# Patient Record
Sex: Male | Born: 1965 | Race: White | Hispanic: No | Marital: Married | Smoking: Never smoker
Health system: Southern US, Community
[De-identification: ages and names within clinical notes are randomized; demographics above are authoritative.]

## PROBLEM LIST (undated history)

## (undated) DIAGNOSIS — I82409 Acute embolism and thrombosis of unspecified deep veins of unspecified lower extremity: Secondary | ICD-10-CM

## (undated) DIAGNOSIS — N183 Chronic kidney disease, stage 3 unspecified: Secondary | ICD-10-CM

## (undated) DIAGNOSIS — E119 Type 2 diabetes mellitus without complications: Secondary | ICD-10-CM

## (undated) DIAGNOSIS — E7800 Pure hypercholesterolemia, unspecified: Secondary | ICD-10-CM

## (undated) DIAGNOSIS — F319 Bipolar disorder, unspecified: Secondary | ICD-10-CM

## (undated) DIAGNOSIS — I10 Essential (primary) hypertension: Secondary | ICD-10-CM

## (undated) HISTORY — PX: APPENDECTOMY: SHX54

---

## 2004-03-28 ENCOUNTER — Other Ambulatory Visit: Payer: Self-pay

## 2004-04-02 ENCOUNTER — Ambulatory Visit: Payer: Self-pay | Admitting: Unknown Physician Specialty

## 2004-07-06 ENCOUNTER — Ambulatory Visit: Payer: Self-pay | Admitting: Unknown Physician Specialty

## 2004-09-08 ENCOUNTER — Inpatient Hospital Stay: Payer: Self-pay | Admitting: Unknown Physician Specialty

## 2005-11-19 ENCOUNTER — Inpatient Hospital Stay: Payer: Self-pay | Admitting: Unknown Physician Specialty

## 2005-11-22 ENCOUNTER — Other Ambulatory Visit: Payer: Self-pay

## 2008-02-25 ENCOUNTER — Encounter: Admission: RE | Admit: 2008-02-25 | Discharge: 2008-02-25 | Payer: Self-pay | Admitting: Family Medicine

## 2008-02-25 ENCOUNTER — Inpatient Hospital Stay (HOSPITAL_COMMUNITY): Admission: EM | Admit: 2008-02-25 | Discharge: 2008-03-02 | Payer: Self-pay | Admitting: Emergency Medicine

## 2008-02-25 ENCOUNTER — Encounter (INDEPENDENT_AMBULATORY_CARE_PROVIDER_SITE_OTHER): Payer: Self-pay | Admitting: General Surgery

## 2008-03-02 ENCOUNTER — Inpatient Hospital Stay (HOSPITAL_COMMUNITY): Admission: AD | Admit: 2008-03-02 | Discharge: 2008-03-14 | Payer: Self-pay | Admitting: Psychiatry

## 2008-03-02 ENCOUNTER — Ambulatory Visit: Payer: Self-pay | Admitting: Psychiatry

## 2008-03-14 ENCOUNTER — Inpatient Hospital Stay: Payer: Self-pay | Admitting: Unknown Physician Specialty

## 2010-08-23 ENCOUNTER — Emergency Department (HOSPITAL_COMMUNITY)
Admission: EM | Admit: 2010-08-23 | Discharge: 2010-08-23 | Disposition: A | Discharge status: Home / Self Care | Payer: BC Managed Care – PPO | Attending: Emergency Medicine | Admitting: Emergency Medicine

## 2010-08-23 DIAGNOSIS — I10 Essential (primary) hypertension: Secondary | ICD-10-CM | POA: Insufficient documentation

## 2010-08-23 DIAGNOSIS — Z79899 Other long term (current) drug therapy: Secondary | ICD-10-CM | POA: Insufficient documentation

## 2010-08-23 DIAGNOSIS — R5383 Other fatigue: Secondary | ICD-10-CM | POA: Insufficient documentation

## 2010-08-23 DIAGNOSIS — E669 Obesity, unspecified: Secondary | ICD-10-CM | POA: Insufficient documentation

## 2010-08-23 DIAGNOSIS — R358 Other polyuria: Secondary | ICD-10-CM | POA: Insufficient documentation

## 2010-08-23 DIAGNOSIS — R631 Polydipsia: Secondary | ICD-10-CM | POA: Insufficient documentation

## 2010-08-23 DIAGNOSIS — R5381 Other malaise: Secondary | ICD-10-CM | POA: Insufficient documentation

## 2010-08-23 DIAGNOSIS — E785 Hyperlipidemia, unspecified: Secondary | ICD-10-CM | POA: Insufficient documentation

## 2010-08-23 DIAGNOSIS — R3589 Other polyuria: Secondary | ICD-10-CM | POA: Insufficient documentation

## 2010-08-23 DIAGNOSIS — E119 Type 2 diabetes mellitus without complications: Secondary | ICD-10-CM | POA: Insufficient documentation

## 2010-08-23 DIAGNOSIS — F319 Bipolar disorder, unspecified: Secondary | ICD-10-CM | POA: Insufficient documentation

## 2010-08-23 DIAGNOSIS — R000 Tachycardia, unspecified: Secondary | ICD-10-CM | POA: Insufficient documentation

## 2010-08-23 DIAGNOSIS — R110 Nausea: Secondary | ICD-10-CM | POA: Insufficient documentation

## 2010-08-23 DIAGNOSIS — H538 Other visual disturbances: Secondary | ICD-10-CM | POA: Insufficient documentation

## 2010-08-23 LAB — DIFFERENTIAL
Basophils Relative: 2 % — ABNORMAL HIGH (ref 0–1)
Eosinophils Absolute: 0 10*3/uL (ref 0.0–0.7)
Lymphs Abs: 1.2 10*3/uL (ref 0.7–4.0)
Monocytes Absolute: 1 10*3/uL (ref 0.1–1.0)
Neutro Abs: 8.6 10*3/uL — ABNORMAL HIGH (ref 1.7–7.7)
Neutrophils Relative %: 78 % — ABNORMAL HIGH (ref 43–77)

## 2010-08-23 LAB — CBC
MCH: 29.8 pg (ref 26.0–34.0)
MCHC: 36.5 g/dL — ABNORMAL HIGH (ref 30.0–36.0)
MCV: 81.6 fL (ref 78.0–100.0)
Platelets: ADEQUATE 10*3/uL (ref 150–400)
RDW: 12.2 % (ref 11.5–15.5)

## 2010-08-23 LAB — POCT I-STAT, CHEM 8
Calcium, Ion: 1.19 mmol/L (ref 1.12–1.32)
Glucose, Bld: 635 mg/dL (ref 70–99)
HCT: 49 % (ref 39.0–52.0)
Hemoglobin: 16.7 g/dL (ref 13.0–17.0)

## 2010-08-24 ENCOUNTER — Observation Stay (HOSPITAL_COMMUNITY)
Admission: EM | Admit: 2010-08-24 | Discharge: 2010-08-25 | Disposition: A | Discharge status: Home / Self Care | Payer: BC Managed Care – PPO | Source: Ambulatory Visit | Attending: Internal Medicine | Admitting: Internal Medicine

## 2010-08-24 DIAGNOSIS — E785 Hyperlipidemia, unspecified: Secondary | ICD-10-CM | POA: Insufficient documentation

## 2010-08-24 DIAGNOSIS — Z23 Encounter for immunization: Secondary | ICD-10-CM | POA: Insufficient documentation

## 2010-08-24 DIAGNOSIS — E1010 Type 1 diabetes mellitus with ketoacidosis without coma: Principal | ICD-10-CM | POA: Insufficient documentation

## 2010-08-24 DIAGNOSIS — F319 Bipolar disorder, unspecified: Secondary | ICD-10-CM | POA: Insufficient documentation

## 2010-08-24 DIAGNOSIS — Z794 Long term (current) use of insulin: Secondary | ICD-10-CM | POA: Insufficient documentation

## 2010-08-24 DIAGNOSIS — R110 Nausea: Secondary | ICD-10-CM | POA: Insufficient documentation

## 2010-08-24 DIAGNOSIS — I10 Essential (primary) hypertension: Secondary | ICD-10-CM | POA: Insufficient documentation

## 2010-08-24 LAB — BASIC METABOLIC PANEL
CO2: 19 mEq/L (ref 19–32)
Chloride: 94 mEq/L — ABNORMAL LOW (ref 96–112)
Creatinine, Ser: 1.77 mg/dL — ABNORMAL HIGH (ref 0.4–1.5)
GFR calc Af Amer: 50 mL/min — ABNORMAL LOW (ref >60)
Potassium: 4 mEq/L (ref 3.5–5.1)

## 2010-08-24 LAB — GLUCOSE, CAPILLARY
Glucose-Capillary: 351 mg/dL — ABNORMAL HIGH (ref 70–99)
Glucose-Capillary: 364 mg/dL — ABNORMAL HIGH (ref 70–99)
Glucose-Capillary: 446 mg/dL — ABNORMAL HIGH (ref 70–99)

## 2010-08-24 LAB — CBC
Hemoglobin: 13.8 g/dL (ref 13.0–17.0)
MCH: 29.6 pg (ref 26.0–34.0)
MCV: 83.5 fL (ref 78.0–100.0)
RBC: 4.66 MIL/uL (ref 4.22–5.81)
WBC: 4.7 10*3/uL (ref 4.0–10.5)

## 2010-08-24 LAB — URINALYSIS, ROUTINE W REFLEX MICROSCOPIC
Glucose, UA: >1000 mg/dL — AB
Ketones, ur: >80 mg/dL — AB
Leukocytes, UA: NEGATIVE
Nitrite: NEGATIVE
Specific Gravity, Urine: 1.025 (ref 1.005–1.030)
pH: 5 (ref 5.0–8.0)

## 2010-08-24 LAB — URINE MICROSCOPIC-ADD ON

## 2010-08-25 LAB — HEMOGLOBIN A1C
Hgb A1c MFr Bld: 12.7 % — ABNORMAL HIGH (ref <5.7)
Mean Plasma Glucose: 318 mg/dL — ABNORMAL HIGH (ref <117)

## 2010-08-25 LAB — CBC
HCT: 33.3 % — ABNORMAL LOW (ref 39.0–52.0)
Hemoglobin: 11.6 g/dL — ABNORMAL LOW (ref 13.0–17.0)
RBC: 4.03 MIL/uL — ABNORMAL LOW (ref 4.22–5.81)
RDW: 12.5 % (ref 11.5–15.5)
WBC: 3.4 10*3/uL — ABNORMAL LOW (ref 4.0–10.5)

## 2010-08-25 LAB — BASIC METABOLIC PANEL
GFR calc Af Amer: >60 mL/min (ref >60)
GFR calc non Af Amer: 53 mL/min — ABNORMAL LOW (ref >60)
Potassium: 3.3 mEq/L — ABNORMAL LOW (ref 3.5–5.1)
Sodium: 137 mEq/L (ref 135–145)

## 2010-08-25 LAB — GLUCOSE, CAPILLARY
Glucose-Capillary: 328 mg/dL — ABNORMAL HIGH (ref 70–99)
Glucose-Capillary: 361 mg/dL — ABNORMAL HIGH (ref 70–99)

## 2010-08-27 LAB — GLUCOSE, CAPILLARY
Glucose-Capillary: 165 mg/dL — ABNORMAL HIGH (ref 70–99)
Glucose-Capillary: 159 mg/dL — ABNORMAL HIGH (ref 70–99)

## 2010-09-04 NOTE — Discharge Summary (Signed)
  NAMEWYNDELL, Cameron [name] NO.:  [id number]  MEDICAL RECORD NO.:  [id number]           PATIENT TYPE:  O  LOCATION:  5122                         FACILITY:  [facility]  PHYSICIAN:  Lonia Blood, M.D.       DATE OF BIRTH:  1966/01/20  DATE OF ADMISSION:  08/24/2010 DATE OF DISCHARGE:  08/25/2010                              DISCHARGE SUMMARY   PRIMARY CARE PHYSICIAN:  Dibas Koirala, MD.  DISCHARGE DIAGNOSES: 1. Mild diabetic ketoacidosis, resolved. 2. Diabetes mellitus, probably type 2, insulin requiring. 3. Bipolar disorder. 4. Morbid obesity. 5. Hyperlipidemia. 6. Hypertension.  DISCHARGE MEDICATIONS: 1. Abilify 30 mg at bedtime. 2. Tegretol 200 mg 2 to 3 tablets twice a day. 3. Enalapril 5 mg twice a day. 4. Lamictal 400 mg at bedtime. 5. Lantus pen 15 units at bedtime and to up titrate 2 units if the     CBGs are more than 250 and down titrate 2 units if the CBGs are     less than 100. 6. Metformin 1000 mg twice a day. 7. Multivitamin daily. 8. Seroquel 4 tablets of 200 mg at bedtime. 9. Simvastatin 40 mg at bedtime. 10.Temazepam 15 mg at bedtime.  CONDITION ON DISCHARGE:  [name] was discharged in good condition, alert, and in no acute distress, tolerating a regular diet.  He will follow up with his primary care physician, Dr. Docia Chuck, on August 27, 2010.  PROCEDURE THIS ADMISSION:  No procedures done.  CONSULTATION:  No consultations obtained.  HISTORY AND PHYSICAL:  Refer to the dictated H and P done by Dr. Butler Denmark.  HOSPITAL COURSE:  [name] is a 45 year old gentleman with known diabetes mellitus presenting with complaints of nausea and elevated glucose levels.  His admission bicarbonate was 19.  His admission anion gap was 18.  Mrs. Lienhard was admitted to the regular floor at [facility] where he was started on an insulin drip.  His hemoglobin A1c came back at 12.7.  While this patient probably has diabetes mellitus type 2 due to  his morbid obesity, body habitus, I think he does lack insulin as proven by his mild ketoacidosis.  I started the patient on Lantus for further titration and glucose control.  In the long run, he will have to be decided if he also needs mealtime coverage.  This will be best decided as depending on how he behaves over the course of the next months.  Otherwise, the patient's chronic medication is being continued without changes.  He will follow up on Monday, August 27, 2010, with his primary care physician.     Lonia Blood, M.D.     SL/MEDQ  D:  08/26/2010  T:  08/27/2010  Job:  [id number]  cc:   Darrow Bussing, MD  Electronically Signed by Lonia Blood M.D. on 09/04/2010 12:24:48 PM

## 2010-09-15 NOTE — H&P (Signed)
NAME:  Cameron [name], Cameron [name] NO.:  [id number]  MEDICAL RECORD NO.:  [id number]           PATIENT TYPE:  E  LOCATION:  [facility]                         FACILITY:  [facility]  PHYSICIAN:  Calvert Cantor, M.D.     DATE OF BIRTH:  1965/08/22  DATE OF ADMISSION:  08/24/2010 DATE OF DISCHARGE:                             HISTORY & PHYSICAL   PRIMARY CARE PHYSICIAN:  At [facility] [place].  PRESENTING COMPLAINT:  High blood sugar.  HISTORY OF PRESENT ILLNESS:  This is a 45 year old non-insulin requiring diabetic with bipolar disorder and hyperlipidemia.  The patient came into the ER yesterday with elevated blood sugar.  He is on metformin 500 twice a day at home.  This was doubled by the ER doctor and he was sent back home.  Today he started having nausea and vomiting and decided to return to the hospital and is being admitted for high blood sugars.  The patient admits that about a week ago he started craving juices and fruit and for the past 3-4 days, he has not eaten anything but has been drinking orange juice, grape juice, primary juice, eating fruit and drinking water.  He does not check his sugars daily but states that usually his sugars are well controlled.  His last A1c was 9 months ago but he does not recall what it was.  He believes it was well controlled. Other than increased thirst, he is complaining of being tired.  He has had diarrhea for the past 2-3 days since he started the juices and fruits.  Does not complain of any abdominal pain.  No mucous or blood noted in his stool or in his vomitus.  No fever, chills or sweats.  No recent cold, cough, sinus infection.  No dysuria or hematuria.  No new rash.  PAST MEDICAL HISTORY: 1. Diabetes mellitus, non-insulin requiring diagnosed 3 years ago. 2. Bipolar disorder which is stable on current meds. 3. Hyperlipidemia. 4. The patient is also obese. 5. Hypertension.  SOCIAL HISTORY:  Does not smoke or  drink.  He lives with his wife.  No drug abuse.  ALLERGIES:  ASPIRIN.  HOME MEDICATIONS: 1. Abilify 30 mg at bedtime. 2. Lamictal 100 mg 4 tablets at bedtime. 3. Metformin 500 mg b.i.d. 4. Restoril 15 mg nightly. 5. Seroquel 200 mg 4 tablets at bedtime. 6. Tegretol 200 mg 2 tablets in the morning, 3 in the evening. 7. Vasotec 5 mg b.i.d. 8. Zocor 40 mg nightly.  REVIEW OF SYSTEMS:  CONSTITUTIONAL:  No recent weight loss or weight gain.  Positive for fatigue.  No fever, chills or sweats.  HEENT: Complains of blurred vision for the past few days.  No sore throat, sinus trouble, earache.  RESPIRATORY:  No shortness of breath or cough. CARDIAC:  No chest pain, palpitations or pedal edema.  GI:  As per H and P.  No history of ulcers.  GU:  No dysuria or hematuria.  He has had increased frequency of urination.  HEMATOLOGIC:  No easy bruising. SKIN:  No rash.  MUSCULOSKELETAL:  No joint pain or back pain. NEUROLOGIC:  No history  of stroke or seizure.  No symptoms of neuropathy.  PSYCHOLOGIC:  Bipolar disorder is well controlled.  PHYSICAL EXAM:  GENERAL:  Middle-aged male sitting in bed in no acute distress. VITAL SIGNS:  Blood pressure 119/59, pulse 95, respiratory rate 20, temperature 98.4. HEENT:  Pupils equal, round and reactive to light.  Extraocular movements are intact.  Conjunctivae are pink.  No scleral icterus.  Oral mucosa is dry.  Oropharynx is clear. NECK:  Supple.  No thyromegaly, lymphadenopathy or carotid bruits. HEART:  Regular rate and rhythm.  No murmurs, rubs or gallops. LUNGS:  Clear bilaterally.  Normal respiratory effort.  No use of accessory muscles. ABDOMEN:  Obese, soft, nontender, nondistended.  Bowel sounds positive. No organomegaly. EXTREMITIES:  No cyanosis, clubbing or edema.  Pedal pulses positive. NEUROLOGIC:  Cranial nerves II-XII are intact.  Strength is intact in all 4 extremities. PSYCHOLOGIC:  Awake, alert, oriented x3.  Mood and affect  normal. SKIN:  Warm and dry.  No rash or bruising.  Blood work, CBC is all within normal limits.  Metabolic panel reveals a sodium of 129 and glucose of 429, BUN is 17, creatinine 1.77, potassium is normal at 4.0.  ASSESSMENT AND PLAN: 1. Hyperglycemia/uncontrolled diabetes mellitus type 2.  I suspect     this is due to his current intake of juices and fruits and not much     else.  He is currently on a Glucommander which I will continue.  I     will obtain a hemoglobin A1c.  I will go ahead and check UA.  We     will continue metformin.  I have advised him about appropriate     diabetic diet.  I will place him on a high carb modified and heart     healthy diet. 2. Hypertension.  BP is low.  We will continue Vasotec with holding     parameters. 3. Hyperlipidemia. 4. Obesity. 5. Bipolar disorder.  He is dehydrated as well.  We will continue normal saline at 150 mL an hour.  Monitor Is and Os.  DVT prophylaxis with Lovenox.  For nausea and vomiting we will give him Zofran.  He does however state that this has resolved.  Time on admission 50 minutes.     Calvert Cantor, M.D.     SR/MEDQ  D:  08/24/2010  T:  08/24/2010  Job:  [id number]  cc:   [facility]  Electronically Signed by Calvert Cantor M.D. on 09/15/2010 04:00:56 PM

## 2010-10-16 NOTE — Discharge Summary (Signed)
Cameron [name], Cameron [name]   MEDICAL RECORD NO.:  [id number]          PATIENT TYPE:  INP   LOCATION:  2921                         FACILITY:  [facility]   PHYSICIAN:  Maisie Fus A. Cornett, M.D.DATE OF BIRTH:  1966/02/26   DATE OF ADMISSION:  02/25/2008  DATE OF DISCHARGE:  02/29/2008                               DISCHARGE SUMMARY   CONSULTANT:  Dr. Jeanie Sewer with psychiatry   PROCEDURE:  A laparoscopic appendectomy was done by Dr. Abbey Chatters on  February 25, 2008.   REASON FOR ADMISSION:  Cameron [name] is a 45 year old male who has been  having 4 days of right lower quadrant pain which has been persistent  with an associated fever.  Apparently, he was seen by his primary care  physician and underwent a CT scan which demonstrated acute appendicitis.  At that time, he was sent to the emergency room where we were consulted  to see the patient.  At that time, it was felt the patient had acute  appendicitis, and, therefore, the patient was admitted for a  laparoscopic appendectomy.  Please see admitting history and physical  for further details.   ADMITTING DIAGNOSES:  1. Acute appendicitis.  2. Bipolar disorder.  3. Hypertension.  4. Type 2 diabetes mellitus.  5. Hyperlipidemia.   HOSPITAL COURSE:  From the emergency department, the patient was taken  up to the operating room where a laparoscopic appendectomy was  performed.  During this procedure, apparently the patient's appendix was  stuck somewhat to the retroperitoneum.  Therefore, in the process, the  appendix was resected as well as a small part of the cecum.  The patient  tolerated this procedure well.  At this time, the patient was placed on  IV Unasyn.  On postoperative day one-half, the patient was saying that  he was hungry.  He was having some abdominal pain but otherwise no  complaints.  On exam, his abdomen was soft, tender in the right lower  quadrant with hypoactive bowel sounds.  At  this time, the patient was  continued on his clear liquids as well as his Unasyn.   Later that night, apparently, the patient either had a syncopal episode  or a vasovagal episode and was found down.  At this time, a code blue  was called, and the code blue team arrived.  The patient did not have to  undergo CPR.  However, his oxygen was assisted with a bag-mouth mask.  Apparently, after several minutes, the patient came to, was responsive  and alert and oriented x4.  Several labs were drawn such as D-dimer as  well as cardiac enzymes which were all negative except for the D-dimer  which was somewhat elevated at 2.38.  Otherwise, an EKG was done as well  which showed normal sinus rhythm except for tachycardia.   On postoperative day 2, the patient had no complaints.  His exam was  normal with clear lungs, benign abdomen.  At this point in time, his  diet was advanced.  Later that day, apparently, the wife noted that the  patient's state of being was  increasingly becoming more manic even  though the patient was on most of his home psychiatric medications.  Therefore, at this time, psychiatry was consulted to help manage the  patient's bipolar issues as well as his manic episode.   By postoperative day 3, the patient was continuing to improve.  At this  time, he was having normal bowel movements, and his abdomen was no  longer tender.  At this time, we continued to keep him in the hospital  for observation for his manic state.  By postoperative day 4, it is  noted that the patient is currently very manic; however, he is pleasant.  He does have significant flight of ideas.  However, on exam, his abdomen  is very soft, nontender and is benign.  At this time, it is felt that  the patient is surgically stable for discharge.  However, due to the  patient's psychiatric issues, it is felt by Dr. Jeanie Sewer as well as Dr.  Luisa Hart that the patient needs to be transferred to [facility]  for  further psychiatric help.  Therefore, at this time, the patient will  be sent to [facility] once a bed is maintained for the patient.   DISCHARGE DIAGNOSES:  1. Acute appendicitis.  2. Status post laparoscopic appendectomy.  3. Bipolar disorder, currently in a very manic state.  4. Hypertension.  5. Type 2 diabetes.  6. Hyperlipidemia.   DISCHARGE MEDICATIONS:  The patient may resume all normal home  medications which include the following:  1. Abilify 30 mg at bedtime.  2. Enalapril 5 mg b.i.d.  3. Lamotrigine 400 mg at bedtime.  4. Seroquel 400 mg at bedtime.  5. Chloral hydrate 500 mg at bedtime p.r.n. for insomnia.  6. Tegretol 400 mg in the morning and 600 mg at night.  7. Metformin HCl 500 mg daily.  8. Zocor 40 mg daily.  9. Calcium plus D 600 mg daily.  10.Multivitamin 1 tablet daily.   NEW ADDITIONAL MEDICATIONS:  1. The patient may take Percocet 1-2 tablets q.4 h. as needed for      pain.  2. Augmentin 875 mg 1 p.o. b.i.d. for 5 days.   DISCHARGE INSTRUCTIONS:  The patient is informed currently that he may  shower.  He is to pat his wounds dry.  He needs to increase his  activities slowly, and he may walk up steps.  At this time, he does not  have any dietary restrictions except for his normal diet based on his  diabetic regimen at home.  Otherwise, the patient is informed not to  lift anything greater than approximately 15 pounds for the next 2 weeks.  Currently, the patient is being sent to [facility].  However, he  will need to follow up with Dr. Abbey Chatters in our office in  approximately 2-3 weeks for postop visit.  Otherwise, at this time, the  patient may return to work once more psychologically stable and once  patient is released from [employer].  Obviously, those further-  type instructions will be dependent upon  the patient's progress at [facility].  Otherwise, please call our  office if the patient begins to get a fever  greater than 101.5,  worsening abdominal pain that feels like he is having appendicitis all  over again or redness or pus-like drainage from his incision.      Letha Cape, PA      Maisie Fus A. Cornett, M.D.  Electronically Signed    KEO/MEDQ  D:  02/29/2008  T:  02/29/2008  Job:  [id number]   cc:   Adolph Pollack, M.D.  Jethro Bastos, M.D.  Antonietta Breach, M.D.

## 2010-10-16 NOTE — Consult Note (Signed)
NAMECHANDON, LAZCANO NO.:  [id number]   MEDICAL RECORD NO.:  [id number]          PATIENT TYPE:  INP   LOCATION:  5154                         FACILITY:  [facility]   PHYSICIAN:  Antonietta Breach, M.D.  DATE OF BIRTH:  1966/01/02   DATE OF CONSULTATION:  02/29/2008  DATE OF DISCHARGE:                                 CONSULTATION   REASON FOR CONSULTATION:  High expansive mood and pressured speech,  impaired judgment.   REQUESTING PHYSICIAN:  Clovis Pu. Cornett, M.D. of [facility]  surgery.   HISTORY OF PRESENT ILLNESS:  Mr. Eddings is a 45 year old male admitted  to the [facility] on September 24 with an appendicitis.  Mr. Heindl  has undergone a laparoscopic appendectomy.  He has developed  approximately 3 days of pressured speech, expansive euphoric mood,  looseness of associations mixed with a racing and tangential thoughts,  increased energy, and impaired judgment for social functioning.   However, he is redirectable and is cooperative with staff bedside care.  He does have intact memory and orientation function.  He is not  combative.  His acute stress involves the general medical factors as  mentioned above.  He also acknowledges that he stopped his psychotropic  medication.   PAST PSYCHIATRIC HISTORY:  Mr. Goyne has listed in the past medical  record that he suffers from bipolar disorder.  His outpatient  psychiatric medications have been Abilify, chloral hydrate, Lamictal,  Seroquel, and Tegretol.  The dosages are not listed.  However, they are  currently as below in the medication section.   FAMILY PSYCHIATRIC HISTORY:  None known.   SOCIAL HISTORY:  Mr. Jarriel is married.  He is occupied as an Curator.  He mentions that he went to [employer] followed by  [employer].  He does not use any alcohol or illegal  drugs.   PAST MEDICAL HISTORY:  1. Hypertension.  2. Diabetes mellitus type 2.  3. History  of nasal polypectomy and adenoidectomy.   MEDICATIONS:  AMA are is reviewed.  Mr. Eisen is on:  1. Cogentin 0.5 mg b.i.d.  2. Tegretol 400 mg q.a.m., 600 mg at bedtime.  3. Haldol 5 mg b.i.d.  4. Lamictal 400 mg at bedtime.  5. Seroquel 400 mg at bedtime.  6. Haldol 5 mg q.6 h p.r.n.   ALLERGIES:  ASPIRIN.   LABORATORY DATA:  SGOT 20, SGPT 20, WBC 6.4, hemoglobin 12.8, platelet  count 181,000.  Sodium 140, BUN 7, creatinine 1.34, glucose 162.  Urinalysis, lipase, Tegretol and INR all unremarkable.   REVIEW OF SYSTEMS:  Constitutional, HEENT, mouth, neurologic,  psychiatric, cardiovascular, respiratory, gastrointestinal,  genitourinary, skin, musculoskeletal, hematologic, lymphatic, endocrine,  metabolic all unremarkable.   PHYSICAL EXAMINATION:  VITAL SIGNS:  Temperature 99.1, pulse 91,  respiratory rate 22, blood pressure 141/89, oxygen saturation saturation  on room air 99%.  GENERAL APPEARANCE:  Mr. Chrostowski is a middle-aged male pacing back and  forth in his hospital room demonstrating in an illogical way various two-  dimensional patterns as they apply to airport design.  Mr. Persing is  redirectable.  He does not display any abnormal involuntary movements.   MENTAL STATUS EXAM:  Mr. Tata has intact eye contact.  His attention  span is limited by his flight of ideas.  Concentration is mildly  decreased as well.  His affect is euphoric and expansive.  His mood is  euphoric.  He is oriented to all spheres.  His memory is grossly intact  to immediate recent and remote.  His fund of knowledge and intelligence  are within normal limits.  His speech is pressured with increased rate.  There is no dysarthria.  Thought process:  There are severe looseness of  associations at times as well as flight of ideas.  His thought process  is highly increased in rate.  Thought content:  There are no specific  hallucinations or delusions that are discernible due to his severe   looseness of associations.  He does not have any thoughts of harming  himself or others.  He does  have poor insight except that he  understands that he is in a high state of mood and that his thoughts  need to be slowed down if he can return to work.  His judgment is  impaired for appropriate social and occupational functioning outside of  the hospital.   ASSESSMENT:  AXIS I:  293.83.  Mood disorder, not otherwise specified,  rule out 296.80 bipolar disorder not otherwise specified, manic.  AXIS II:  Deferred.  AXIS III:  See past medical history.  AXIS IV:  General medical.  AXIS V:  20.   Due to Mr. Gauger severe manic state, he has severely impaired  judgment for functioning outside of the hospital and outside of an  institutional setting.  He would be at risk for passive and yet  potentially lethal self-neglect.   RECOMMENDATIONS:  1. Would admit Mr. Fullilove to an inpatient psychiatric unit as soon      as possible.  2. Low stimulation ego support.  3. Would continue with his current psychotropic medications, Cogentin      as above for anti-EPS, Seroquel 400 mg at bedtime, augmented with      Haldol 5 mg b.i.d. for anti-psychosis and acute mood stabilization.  4. Will defer changes in his anticonvulsant therapy for primary mood      stabilization.  It is unclear to what degree he will require a      combination of Tegretol and Lamictal at this time.  5. Would recommend as he progresses through his hospital course that      the case manager obtain his outpatient psychiatric records to      ascertain his long-term and recent psychotropic medication      treatment.  6. For acute anti-agitation p.r.n. treatment, would utilize Ativan 1      to 4 mg p.o. IM or IV q.2 h p.r.n. anxiety or severe agitation.      this is Adelene Amas  MD.  Paulina FusiAntonietta Breach, M.D.  Electronically Signed     JW/MEDQ  D:  02/29/2008  T:  03/01/2008  Job:  [id number]

## 2010-10-16 NOTE — Discharge Summary (Signed)
NAMEDAMEL, QUERRY NO.:  [id number]   MEDICAL RECORD NO.:  [id number]          PATIENT TYPE:  IPS   LOCATION:  0406                          FACILITY:  [facility]   PHYSICIAN:  Anselm Jungling, MD  DATE OF BIRTH:  1965/09/24   DATE OF ADMISSION:  03/02/2008  DATE OF DISCHARGE:  03/14/2008                               DISCHARGE SUMMARY   IDENTIFYING DATA AND REASON FOR ADMISSION:  The patient is a 45 year old  married Caucasian male with a long history of severe, treatment-  resistant bipolar disorder.  He was admitted due to an episode of mania.  Please refer to the admission note for further details pertaining to the  symptoms, circumstances and history that led to his hospitalization.  He  was given an initial Axis I diagnosis of bipolar disorder, most recently  manic, with psychotic features.   MEDICAL AND LABORATORY:  The patient has a history of non-insulin  dependent diabetes mellitus, and recent appendectomy.  He was medically  and physically assessed by the psychiatric nurse practitioner.  There  were no significant medical issues during his hospital stay.   HOSPITAL COURSE:  The patient was admitted to the adult inpatient  psychiatric service.  He presented as a well-nourished, normally-  developed adult male who was extremely euphoric, with rapid and  pressured speech, highly tangential thinking, and delusional ideation.   He had come to [place] with a history of bipolar disorder, and treatment with  numerous various psychotropic medications over the years.  He had been  stabilized since 2007 with 4 different courses of ECT during the year.  Since then, he had been relatively stable on a regimen of Lamictal,  Tegretol, and Abilify.  He had been under the care of Dr. Madilyn Fireman, and  [facility], who had done his ECT in the past.   Shortly before this hospital admission, the patient developed  appendicitis and required appendectomy.  It may be  that the metabolic  stresses associated with surgery and/or anesthesia contributed to his  destabilization.   The patient was treated on the inpatient psychiatric service.  He was  continued on his usual regimen of Lamictal, Tegretol, Abilify, and the  nurse practitioner and pharmacist oversaw his daily insulin regimen, as  well as his treatment for hypertension with Vasotec and Zocor.   It was determined relatively early on that the best possible disposition  would be for the patient to be transferred to [facility]  for another course of ECT under Dr. Madilyn Fireman to address his manic symptoms.  Unfortunately, Dr. Madilyn Fireman was on vacation until Monday, October 12.  As  such, arrangements were made to continue the patient's treatment in our  facility, pending transfer to [facility] on October 12.  We  discussed the possibility of an earlier transfer to [facility]  inpatient psychiatry, but they were not in agreement with this plan, and  insisted that the patient be at transferred on the 12th.   The patient remained highly symptomatic during his stay.  Generally, he  was pleasant, euphoric, highly sociable, but showed  a wide array of  typical manic and euphoric behaviors.  For instance, he took colored  drawing pencils, and illustrated rocket ships all over the walls of his  hospital room.  Fortunately, he was consistently pleasant, cooperative,  and non irritable.  His delusions were bizarre and colorful, but not a  problem in terms of his day-to-day management.   On the 12th hospital day, he was discharged with anticipation of direct  transfer to [facility] for ECT under the care of Dr.  Madilyn Fireman.   DISCHARGE MEDICATIONS:  1. Lamictal 400 mg nightly.  2. Tegretol 600 mg nightly.  3. Abilify 30 mg nightly.  4. Vasotec 5 mg b.i.d.  5. Glucophage 500 mg daily.  6. Zocor 40 mg daily.  7. Calcium carbonate vitamin D3, 1 tablet daily.  8. Tegretol  600 mg q. a.m.  9. Seroquel 800 mg nightly.  10.The patient had been on sliding-scale insulin for diabetes mellitus      as well.   DISCHARGE DIAGNOSES:  AXIS I:  Bipolar disorder, most recently manic  with psychotic features.  AXIS II:  Deferred.  AXIS III:  History of hypertension, diabetes mellitus, recent  appendectomy.  AXIS IV:  Stressors severe.  AXIS V: GAF on discharge 35.      Anselm Jungling, MD  Electronically Signed     SPB/MEDQ  D:  03/14/2008  T:  03/14/2008  Job:  [id number]

## 2010-10-16 NOTE — Op Note (Signed)
Cameron [name], Cameron NO.:  [id number]   MEDICAL RECORD NO.:  [id number]          PATIENT TYPE:  INP   LOCATION:  5121                         FACILITY:  [facility]   PHYSICIAN:  Adolph Pollack, M.D.DATE OF BIRTH:  1966-03-31   DATE OF PROCEDURE:  02/25/2008  DATE OF DISCHARGE:                               OPERATIVE REPORT   PREOPERATIVE DIAGNOSIS:  Acute appendicitis.   POSTOPERATIVE DIAGNOSIS:  Acute appendicitis.   PROCEDURE:  Laparoscopic appendectomy.   SURGEON:  Adolph Pollack, MD   INDICATIONS:  This is a 45 year old male who has been having 4 days of  right lower quadrant pain, has been persistent, and accompanied with  fever.  He was seen by his primary care physician and underwent a CT  scan demonstrating acute appendicitis.  He was sent to the emergency  department and is now brought to the operating room.   TECHNIQUE:  He was brought to the operating room, placed supine on the  operating table, and general anesthetic was administered.  Foley  catheter was inserted into the bladder.  The hair on the abdominal wall  was clipped and the abdominal wall was sterilely prepped and draped.  Marcaine was infiltrated in the subumbilical region.  A subumbilical  incision was made through the skin, subcutaneous tissue, fascia, and  peritoneum entering the peritoneal cavity under direct vision.  A  pursestring suture of 0 Vicryl was placed around the fascial edges.  A  Hasson trocar was introduced into the peritoneal cavity and  pneumoperitoneum created by insufflation of CO2 gas.   Next, a laparoscope was introduced.  There was no abscess or evidence of  perforation.  A 5-mm trocar was then placed in the lower midline.  I  then manipulated the right lower quadrant area and found a very firm,  acutely and chronically inflamed appendix with no evidence of  perforation.  A 5-mm trocar was then placed in the right upper quadrant.   Using careful blunt  dissection and the harmonic scalpel, I very  carefully was able to dissect the appendix off the retroperitoneum.  Some of the mesoappendix came off in a piecemeal-type fashion.  There  was some bleeding to the appendiceal artery, which I was able to control  with the harmonic scalpel.  I dissected the antimesenteric fat pad on  the distal ileum free from the appendix as well.  I subsequently was  able to free short, indurated, inflamed appendix, and the inflammation  appeared to go down to the base of the appendix at the cecum.  Once I  completely divided the mesoappendix using the harmonic scalpel, I then  amputated the appendix and a small part of the cecum with the Endo-GIA  stapler.  I then placed the appendix and some of the mesoappendix into  the Endopouch bag and removed it through the subumbilical port.  I  replaced the subumbilical trocar.   I then copiously irrigated out the right lower quadrant area with saline  solution.  It was a small amount of bleeding from the staple line, but  this stopped.  There  was no bleeding from the mesoappendix area.  There  was raw surface in the retroperitoneum around this area from the  dissection.   I evacuated as much of the irrigation fluid as possible.  I then placed  Surgicel over the staple line and in the retroperitoneal area.   I then removed the subumbilical trocar and closed the fascial defect  under laparoscopic vision by tightening up and tying down the  pursestring suture.  The remaining trocars were removed and the  pneumoperitoneum was released.   Skin incisions were closed with 4-0 Monocryl subcuticular stitches  followed by Steri-Strips and sterile dressings.  He tolerated the  procedure well without any apparent complications and was taken to the  recovery in satisfactory condition.      Adolph Pollack, M.D.  Electronically Signed     TJR/MEDQ  D:  02/25/2008  T:  02/26/2008  Job:  [id number]

## 2010-10-16 NOTE — H&P (Signed)
Cameron [name], SPINK NO.:  [id number]   MEDICAL RECORD NO.:  [id number]          PATIENT TYPE:  INP   LOCATION:  5121                         FACILITY:  [facility]   PHYSICIAN:  Adolph Pollack, M.D.DATE OF BIRTH:  08-Mar-1966   DATE OF ADMISSION:  02/25/2008  DATE OF DISCHARGE:                              HISTORY & PHYSICAL   REASON FOR ADMISSION:  Abdominal pain, appendicitis.   HISTORY OF PRESENT ILLNESS:  This is a 45 year old male, who 4 days ago  began having some right lower quadrant pain that persisted.  This was  followed by fever and anorexia.  Today, he went to see his primary care  physician and was noted to have right lower quadrant tenderness to exam.  He was then sent for a CT scan, which was consistent with acute  appendicitis.  He was subsequently sent to the emergency department and  I was asked to see him.  He states he has had no nausea or vomiting or  diarrhea.   PAST MEDICAL HISTORY:  1. Bipolar disorder.  2. Hypertension.  3. Type 2 diabetes mellitus.  4. Hyperlipidemia.   PREVIOUS OPERATIONS:  1. Nasal polypectomy.  2. Adenoidectomy.   ALLERGIES:  ASPIRIN causes stomach ache.   MEDICATIONS:  Abilify, chloral hydrate, calcium, enalapril, Lamictal,  metformin, Seroquel, Tegretol, multivitamin.   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  No  tobacco or alcohol use.   FAMILY HISTORY:  Notable for lung cancer in his father and stroke in his  mother.   REVIEW OF SYSTEMS:  CARDIOVASCULAR:  He denies any of coronary artery  disease or heart attack.  PULMONARY:  He denies pneumonia, asthma, or  cough.  GI:  He denies peptic ulcer disease, melena, hematochezia, or  hepatitis.  GU: No kidney stones, dysuria, hematuria.  ENDOCRINE:  No  thyroid disease.  NEUROLOGIC:  No strokes or seizures.  HEMATOLOGIC:  No  bleeding disorders, blood clots, transfusions.   PHYSICAL EXAMINATION:  GENERAL:  He is a stout male in  no acute  distress, pleasant, and cooperative.  VITAL SIGNS:  Temperature is 99.6, blood pressure is 126/78, pulse 102,  respirations 18.  HEENT:  Normocephalic, atraumatic.  EOMI.  No icterus.  NECK:  Supple without obvious masses.  RESPIRATORY:  Breath sounds equal and clear.  Respirations unlabored.  CARDIOVASCULAR:  Slightly increased rate with a regular rhythm.  No JVD.  ABDOMEN:  Soft.  There is right lower quadrant tenderness to palpation.  No diffuse peritonitis or peritoneal signs.  Hypoactive bowel sounds  noted.  No masses palpable.  EXTREMITIES:  Good muscle tone and range of motion.  SKIN:  No jaundice.   LABORATORY DATA:  Notable for a normal white blood cell count,  hemoglobin 13.  CT scan was reviewed.   IMPRESSION:  Acute appendicitis with pericecal inflammation and  thickening of the appendix.   PLAN:  Admit to the hospital, start IV antibiotics, laparoscopic  possible open appendectomy.  I have went over the procedure and the  risks with him and his wife.  Risks  include, but not limited to  bleeding, infection, wound healing problems, anesthesia, accidental  injury to intra-abdominal organs such as bladder, intestines etc.  They  both seem to understand this and agree with the plan.      Adolph Pollack, M.D.  Electronically Signed     TJR/MEDQ  D:  02/25/2008  T:  02/26/2008  Job:  [id number]   cc:   Jethro Bastos, M.D.

## 2010-10-16 NOTE — Discharge Summary (Signed)
NAMEEILAN, MCINERNY NO.:  [id number]   MEDICAL RECORD NO.:  [id number]          PATIENT TYPE:  INP   LOCATION:  5154                         FACILITY:  [facility]   PHYSICIAN:  Maisie Fus A. Cornett, M.D.DATE OF BIRTH:  30-Sep-1965   DATE OF ADMISSION:  02/25/2008  DATE OF DISCHARGE:                               DISCHARGE SUMMARY   ADDENDUM   Cameron [name] is continuing to do well.  He has not had any more surgical  issues.  At this time he is currently still here awaiting bed placement  at behavioral health.  At this time he will continue with all of his  aforementioned medications and treatment on his other discharge summary.  Like I said earlier, we are just at this time awaiting his bed  placement.      Letha Cape, PA      Maisie Fus A. Cornett, M.D.  Electronically Signed    KEO/MEDQ  D:  03/01/2008  T:  03/01/2008  Job:  [id number]

## 2011-03-04 LAB — GLUCOSE, CAPILLARY
Glucose-Capillary: 101 — ABNORMAL HIGH
Glucose-Capillary: 109 — ABNORMAL HIGH
Glucose-Capillary: 120 — ABNORMAL HIGH
Glucose-Capillary: 125 — ABNORMAL HIGH
Glucose-Capillary: 126 — ABNORMAL HIGH
Glucose-Capillary: 128 — ABNORMAL HIGH
Glucose-Capillary: 128 — ABNORMAL HIGH
Glucose-Capillary: 131 — ABNORMAL HIGH
Glucose-Capillary: 132 — ABNORMAL HIGH
Glucose-Capillary: 138 — ABNORMAL HIGH
Glucose-Capillary: 143 — ABNORMAL HIGH
Glucose-Capillary: 144 — ABNORMAL HIGH
Glucose-Capillary: 145 — ABNORMAL HIGH
Glucose-Capillary: 150 — ABNORMAL HIGH
Glucose-Capillary: 157 — ABNORMAL HIGH
Glucose-Capillary: 161 — ABNORMAL HIGH
Glucose-Capillary: 164 — ABNORMAL HIGH
Glucose-Capillary: 197 — ABNORMAL HIGH

## 2011-03-04 LAB — DIFFERENTIAL
Basophils Absolute: 0
Basophils Relative: 0
Eosinophils Absolute: 0
Eosinophils Relative: 0
Monocytes Absolute: 0.7

## 2011-03-04 LAB — URINALYSIS, ROUTINE W REFLEX MICROSCOPIC
Bilirubin Urine: NEGATIVE
Hgb urine dipstick: NEGATIVE
Specific Gravity, Urine: >1.046 — ABNORMAL HIGH
pH: 5.5

## 2011-03-04 LAB — POCT I-STAT, CHEM 8
Calcium, Ion: 1.1 — ABNORMAL LOW
Chloride: 103
HCT: 37 — ABNORMAL LOW
Hemoglobin: 12.6 — ABNORMAL LOW
Potassium: 3.7

## 2011-03-04 LAB — BLOOD GAS, ARTERIAL
Acid-base deficit: 1.1
Bicarbonate: 23.2
O2 Saturation: 99.5
Patient temperature: 98.6
TCO2: 24.4

## 2011-03-04 LAB — COMPREHENSIVE METABOLIC PANEL
AST: 18
Albumin: 4.3
Alkaline Phosphatase: 59
BUN: 15
Chloride: 101
GFR calc Af Amer: >60
Potassium: 3.7
Total Bilirubin: 0.9

## 2011-03-04 LAB — COMPREHENSIVE METABOLIC PANEL WITH GFR
ALT: 20
AST: 20
Albumin: 3.6
Alkaline Phosphatase: 48
BUN: 7
CO2: 23
Calcium: 8.9
Chloride: 106
Creatinine, Ser: 1.34
GFR calc non Af Amer: 58 — ABNORMAL LOW
Glucose, Bld: 162 — ABNORMAL HIGH
Potassium: 3.5
Sodium: 140
Total Bilirubin: 1.1
Total Protein: 7.1

## 2011-03-04 LAB — CBC
HCT: 36.9 — ABNORMAL LOW
HCT: 40.2
Hemoglobin: 12.8 — ABNORMAL LOW
MCHC: 34.6
MCV: 88.5
Platelets: 214
RBC: 4.17 — ABNORMAL LOW
WBC: 6.6

## 2011-03-04 LAB — CREATININE, SERUM
Creatinine, Ser: 1.34
GFR calc non Af Amer: 58 — ABNORMAL LOW

## 2011-03-04 LAB — CARDIAC PANEL(CRET KIN+CKTOT+MB+TROPI): Total CK: 278 — ABNORMAL HIGH

## 2011-03-04 LAB — D-DIMER, QUANTITATIVE

## 2011-03-05 LAB — GLUCOSE, CAPILLARY
Glucose-Capillary: 105 — ABNORMAL HIGH
Glucose-Capillary: 112 — ABNORMAL HIGH
Glucose-Capillary: 112 — ABNORMAL HIGH
Glucose-Capillary: 113 — ABNORMAL HIGH
Glucose-Capillary: 117 — ABNORMAL HIGH
Glucose-Capillary: 117 — ABNORMAL HIGH
Glucose-Capillary: 117 — ABNORMAL HIGH
Glucose-Capillary: 121 — ABNORMAL HIGH
Glucose-Capillary: 123 — ABNORMAL HIGH
Glucose-Capillary: 125 — ABNORMAL HIGH
Glucose-Capillary: 129 — ABNORMAL HIGH
Glucose-Capillary: 129 — ABNORMAL HIGH
Glucose-Capillary: 131 — ABNORMAL HIGH
Glucose-Capillary: 135 — ABNORMAL HIGH
Glucose-Capillary: 142 — ABNORMAL HIGH
Glucose-Capillary: 143 — ABNORMAL HIGH
Glucose-Capillary: 145 — ABNORMAL HIGH
Glucose-Capillary: 160 — ABNORMAL HIGH
Glucose-Capillary: 173 — ABNORMAL HIGH

## 2011-03-05 LAB — CARBAMAZEPINE LEVEL, TOTAL: Carbamazepine Lvl: 7.4

## 2013-08-19 ENCOUNTER — Emergency Department (HOSPITAL_COMMUNITY)
Admission: EM | Admit: 2013-08-19 | Discharge: 2013-08-20 | Disposition: A | Discharge status: Home / Self Care | Payer: Managed Care, Other (non HMO) | Attending: Emergency Medicine | Admitting: Emergency Medicine

## 2013-08-19 ENCOUNTER — Encounter (HOSPITAL_COMMUNITY): Payer: Self-pay | Admitting: Emergency Medicine

## 2013-08-19 DIAGNOSIS — F319 Bipolar disorder, unspecified: Secondary | ICD-10-CM | POA: Insufficient documentation

## 2013-08-19 DIAGNOSIS — Z792 Long term (current) use of antibiotics: Secondary | ICD-10-CM | POA: Insufficient documentation

## 2013-08-19 DIAGNOSIS — I10 Essential (primary) hypertension: Secondary | ICD-10-CM | POA: Insufficient documentation

## 2013-08-19 DIAGNOSIS — E7800 Pure hypercholesterolemia, unspecified: Secondary | ICD-10-CM | POA: Insufficient documentation

## 2013-08-19 DIAGNOSIS — Z794 Long term (current) use of insulin: Secondary | ICD-10-CM | POA: Insufficient documentation

## 2013-08-19 DIAGNOSIS — K029 Dental caries, unspecified: Secondary | ICD-10-CM

## 2013-08-19 DIAGNOSIS — K047 Periapical abscess without sinus: Secondary | ICD-10-CM | POA: Insufficient documentation

## 2013-08-19 DIAGNOSIS — Z79899 Other long term (current) drug therapy: Secondary | ICD-10-CM | POA: Insufficient documentation

## 2013-08-19 DIAGNOSIS — E119 Type 2 diabetes mellitus without complications: Secondary | ICD-10-CM | POA: Insufficient documentation

## 2013-08-19 HISTORY — DX: Type 2 diabetes mellitus without complications: E11.9

## 2013-08-19 HISTORY — DX: Pure hypercholesterolemia, unspecified: E78.00

## 2013-08-19 HISTORY — DX: Bipolar disorder, unspecified: F31.9

## 2013-08-19 HISTORY — DX: Essential (primary) hypertension: I10

## 2013-08-19 MED ORDER — CLINDAMYCIN PHOSPHATE 900 MG/50ML IV SOLN
900.0000 mg | Freq: Once | INTRAVENOUS | Status: AC
  Administered 2013-08-20: 900 mg via INTRAVENOUS
  Filled 2013-08-19: qty 50

## 2013-08-19 NOTE — ED Notes (Signed)
Pt states that he began having dental pain on Tues, pt states endodontist yesterday am and was prescribed pain medication and put on Amoxicillin today; pt reports increase pain and difficulty swallowing this pm; pt states that the endodontist advised to follow up in 1 week after anbx; pt states that he is not going to make it that far

## 2013-08-19 NOTE — ED Provider Notes (Signed)
CSN: [id number]     Arrival date & time 08/19/13  2155 History  This chart was scribed for Noland Fordyce, [place] working with Arbie Cookey, MD by Roxan Diesel, ED Scribe. This patient was seen in room WTR6/WTR6 and the patient's care was started at 11:34 PM.   Chief Complaint  Patient presents with  . Dental Pain    The history is provided by the patient. No language interpreter was used.    HPI Comments: Cameron [name] is a 48 y.o. male with h/o DM, HTN and hypercholesteremia who presents to the Emergency Department complaining of constant, progressively-worsening left lower dental pain that began 2 days ago.  Pt saw his endodontist yesterday for his pain.  He received imaging and was diagnosed with a dental abscess and placed on amoxicillin 500mg , Norco/Vicodin 5-325mg , ibuprofen 800mg , and Orajel.  Since then he states his pain has worsened to the point that he is getting no relief from his pain medication.  He also reports swelling to that area inside of his mouth with associated difficulty swallowing.  He denies fevers, nausea, or vomiting.  Pt denies prior h/o dental abscesses.  He is allergic to aspirin.  He states when he checked his sugar this morning it was 148, which is fairly typical for him.   Past Medical History  Diagnosis Date  . Bipolar 1 disorder   . Diabetes mellitus without complication   . Hypertension   . Hypercholesteremia     Past Surgical History  Procedure Laterality Date  . Appendectomy      History reviewed. No pertinent family history.   History  Substance Use Topics  . Smoking status: Never Smoker   . Smokeless tobacco: Not on file  . Alcohol Use: Yes     Comment: rarely     Review of Systems  Constitutional: Negative for fever.  HENT: Positive for dental problem and trouble swallowing.   Gastrointestinal: Negative for nausea and vomiting.  All other systems reviewed and are negative.      Allergies  Aspirin  Home  Medications   Current Outpatient Rx  Name  Route  Sig  Dispense  Refill  . amoxicillin (AMOXIL) 500 MG capsule   Oral   Take 500 mg by mouth every 6 (six) hours.         . ARIPiprazole (ABILIFY) 30 MG tablet   Oral   Take 30 mg by mouth daily.         Marland Kitchen atorvastatin (LIPITOR) 20 MG tablet   Oral   Take 20 mg by mouth daily.         . benzocaine (ORAJEL) 10 % mucosal gel   Mouth/Throat   Use as directed 1 application in the mouth or throat as needed for mouth pain.         . Canagliflozin (INVOKANA) 100 MG TABS   Oral   Take 100 mg by mouth daily.         . carbamazepine (TEGRETOL) 200 MG tablet   Oral   Take 400-600 mg by mouth 2 (two) times daily. Take 2 tablets in the morning Take 3 tablets in the evening         . enalapril (VASOTEC) 5 MG tablet   Oral   Take 5 mg by mouth 2 (two) times daily.         . fenofibrate 160 MG tablet   Oral   Take 160 mg by mouth daily.         Marland Kitchen  HYDROcodone-acetaminophen (NORCO/VICODIN) 5-325 MG per tablet   Oral   Take 1 tablet by mouth every 6 (six) hours as needed for moderate pain.         Marland Kitchen ibuprofen (ADVIL,MOTRIN) 800 MG tablet   Oral   Take 800 mg by mouth every 8 (eight) hours as needed for mild pain or moderate pain.         . Insulin Glargine (LANTUS SOLOSTAR) 100 UNIT/ML Solostar Pen   Subcutaneous   Inject 60 Units into the skin daily.          Marland Kitchen lamoTRIgine (LAMICTAL) 100 MG tablet   Oral   Take 400 mg by mouth at bedtime.         . metFORMIN (GLUCOPHAGE) 1000 MG tablet   Oral   Take 1,000 mg by mouth 2 (two) times daily with a meal.         . Multiple Vitamin (MULTIVITAMIN WITH MINERALS) TABS tablet   Oral   Take 1 tablet by mouth daily.         Marland Kitchen omega-3 acid ethyl esters (LOVAZA) 1 G capsule   Oral   Take 1 g by mouth daily.         . QUEtiapine (SEROQUEL) 200 MG tablet   Oral   Take 800 mg by mouth at bedtime.         . temazepam (RESTORIL) 15 MG capsule   Oral    Take 15 mg by mouth at bedtime as needed for sleep.          BP 139/71  Pulse 91  Temp(Src) 100.1 F (37.8 C) (Oral)  Resp 91  SpO2 96%  Physical Exam  Nursing note and vitals reviewed. Constitutional: He is oriented to person, place, and time. He appears well-developed and well-nourished.  HENT:  Head: Normocephalic and atraumatic.  Mouth/Throat: Uvula is midline, oropharynx is clear and moist and mucous membranes are normal. No trismus in the jaw. Abnormal dentition. Dental abscesses and dental caries present. No uvula swelling.  Tenderness under left mandible, with moderate edema.  On left last molar is gingival erythema, edema and dental caries.  Eyes: EOM are normal.  Neck: Normal range of motion.  Cardiovascular: Normal rate.   Pulmonary/Chest: Effort normal.  Musculoskeletal: Normal range of motion.  Neurological: He is alert and oriented to person, place, and time.  Skin: Skin is warm and dry.  Psychiatric: He has a normal mood and affect. His behavior is normal.    ED Course  Procedures (including critical care time)  DIAGNOSTIC STUDIES: Oxygen Saturation is 96% on room air, normal by my interpretation.    COORDINATION OF CARE: 11:39 PM-Discussed treatment plan which includes consult to attending physician with pt at bedside and pt agreed to plan.     Labs Review Labs Reviewed  CBC - Abnormal; Notable for the following:    HCT 38.6 (*)    All other components within normal limits  BASIC METABOLIC PANEL - Abnormal; Notable for the following:    Glucose, Bld 161 (*)    Creatinine, Ser 1.48 (*)    GFR calc non Af Amer 54 (*)    GFR calc Af Amer 62 (*)    All other components within normal limits    Imaging Review Ct Maxillofacial W/cm  08/20/2013   CLINICAL DATA:  Worsening distal pain on the left for 2 days. Status post dental procedure 08/18/2013.  EXAM: CT MAXILLOFACIAL WITH CONTRAST  TECHNIQUE: Multidetector CT imaging of  the maxillofacial structures  was performed with intravenous contrast. Multiplanar CT image reconstructions were also generated. A small metallic BB was placed on the right temple in order to reliably differentiate right from left.  CONTRAST:  100 mL OMNIPAQUE IOHEXOL 300 MG/ML  SOLN  COMPARISON:  None.  FINDINGS: No soft tissue abscess or evidence of cellulitis is identified. No periapical abscess is identified. The patient has multiple fillings. Small mucous retention cysts or polyps are seen in the maxillary sinuses, larger on the right. Visualized paranasal sinuses are otherwise clear. Mastoid air cells and middle ears are clear. Major salivary glands appear normal. Major caliber vascular structures are patent. The globes are intact and the lenses are located. Orbital fat is clear. Imaged intracranial contents appear normal.  IMPRESSION: No acute finding.  Negative for soft tissue or periapical abscess.  Small mucous retention cysts or polyps in the maxillary sinuses.   Electronically Signed   By: Inge Rise M.D.   On: 08/20/2013 02:48     EKG Interpretation None      MDM   Final diagnoses:  Pain due to dental caries    Pt with hx of diabetes c/o left facial pain and swelling. Pt dx by endodontist yesterday, prescribed amoxicillin today and given Vicodin and ibuprofen. Pt states symptoms worsening including increased pain and difficulty swallowing. No increased difficulty breathing.  Pt appears well, non-toxic. NAD. On exam, no tonsillar abscess but there is a dental cary and left lower facial tenderness and swelling.  Pt started on IV clindamycin, Toradol, and morphine  CT Maxillofacial: no acute finding.  Discussed pt with Dr. Sabra Heck. Will discharge pt home as he states he is feeling better. He has been able to keep down several ounces of PO fluids.  Advised to continue taking medications as prescribed.  Advised to f/u with Dentist as previously scheduled. Return precautions provided. Pt verbalized understanding  and agreement with tx plan.   I personally performed the services described in this documentation, which was scribed in my presence. The recorded information has been reviewed and is accurate.   Noland Fordyce, PA-C 08/20/13 0400

## 2013-08-20 ENCOUNTER — Encounter (HOSPITAL_COMMUNITY): Payer: Self-pay

## 2013-08-20 ENCOUNTER — Emergency Department (HOSPITAL_COMMUNITY): Payer: Managed Care, Other (non HMO)

## 2013-08-20 LAB — CBC
HCT: 38.6 % — ABNORMAL LOW (ref 39.0–52.0)
Hemoglobin: 13.5 g/dL (ref 13.0–17.0)
MCH: 29.4 pg (ref 26.0–34.0)
MCHC: 35 g/dL (ref 30.0–36.0)
MCV: 84.1 fL (ref 78.0–100.0)
Platelets: 243 10*3/uL (ref 150–400)
RBC: 4.59 MIL/uL (ref 4.22–5.81)
RDW: 11.8 % (ref 11.5–15.5)
WBC: 6.9 10*3/uL (ref 4.0–10.5)

## 2013-08-20 LAB — BASIC METABOLIC PANEL
BUN: 17 mg/dL (ref 6–23)
CO2: 23 mEq/L (ref 19–32)
Calcium: 10 mg/dL (ref 8.4–10.5)
Chloride: 97 mEq/L (ref 96–112)
Creatinine, Ser: 1.48 mg/dL — ABNORMAL HIGH (ref 0.50–1.35)
GFR calc Af Amer: 62 mL/min — ABNORMAL LOW (ref >90)
GFR calc non Af Amer: 54 mL/min — ABNORMAL LOW (ref >90)
Glucose, Bld: 161 mg/dL — ABNORMAL HIGH (ref 70–99)
Potassium: 4.2 mEq/L (ref 3.7–5.3)
Sodium: 138 mEq/L (ref 137–147)

## 2013-08-20 MED ORDER — MORPHINE SULFATE 4 MG/ML IJ SOLN
4.0000 mg | Freq: Once | INTRAMUSCULAR | Status: AC
  Administered 2013-08-20: 4 mg via INTRAVENOUS
  Filled 2013-08-20: qty 1

## 2013-08-20 MED ORDER — KETOROLAC TROMETHAMINE 30 MG/ML IJ SOLN
30.0000 mg | Freq: Once | INTRAMUSCULAR | Status: AC
  Administered 2013-08-20: 30 mg via INTRAVENOUS
  Filled 2013-08-20: qty 1

## 2013-08-20 MED ORDER — IOHEXOL 300 MG/ML  SOLN
100.0000 mL | Freq: Once | INTRAMUSCULAR | Status: AC | PRN
  Administered 2013-08-20: 100 mL via INTRAVENOUS

## 2013-08-20 NOTE — ED Notes (Signed)
Pt presents with dental pain, had dental work done earlier this week and pain has increased since then

## 2013-08-20 NOTE — ED Provider Notes (Signed)
Medical screening examination/treatment/procedure(s) were performed by non-physician practitioner and as supervising physician I was immediately available for consultation/collaboration.    Johnna Acosta, MD 08/20/13 [phone]

## 2013-08-20 NOTE — Discharge Instructions (Signed)
Continue taking all medications as prescribed including finishing all of your antibiotics as prescribed. Be sure to follow up as scheduled with your endodontist for recheck of symptoms. Return to ER for recheck of symptoms if NEW or worsening symptoms develop including difficulty breathing or unable to swallow liquids.   Dental Pain A tooth ache may be caused by cavities (tooth decay). Cavities expose the nerve of the tooth to air and hot or cold temperatures. It may come from an infection or abscess (also called a boil or furuncle) around your tooth. It is also often caused by dental caries (tooth decay). This causes the pain you are having. DIAGNOSIS  Your caregiver can diagnose this problem by exam. TREATMENT   If caused by an infection, it may be treated with medications which kill germs (antibiotics) and pain medications as prescribed by your caregiver. Take medications as directed.  Only take over-the-counter or prescription medicines for pain, discomfort, or fever as directed by your caregiver.  Whether the tooth ache today is caused by infection or dental disease, you should see your dentist as soon as possible for further care. SEEK MEDICAL CARE IF: The exam and treatment you received today has been provided on an emergency basis only. This is not a substitute for complete medical or dental care. If your problem worsens or new problems (symptoms) appear, and you are unable to meet with your dentist, call or return to this location. SEEK IMMEDIATE MEDICAL CARE IF:   You have a fever.  You develop redness and swelling of your face, jaw, or neck.  You are unable to open your mouth.  You have severe pain uncontrolled by pain medicine. MAKE SURE YOU:   Understand these instructions.  Will watch your condition.  Will get help right away if you are not doing well or get worse. Document Released: 05/20/2005 Document Revised: 08/12/2011 Document Reviewed: 01/06/2008 [facility] Patient  Information 2014 [place].  Dental Caries Dental caries is tooth decay. This decay can cause a hole in teeth (cavity) that can get bigger and deeper over time. HOME CARE  Brush and floss your teeth. Do this at least two times a day.  Use a fluoride toothpaste.  Use a mouth rinse if told by your dentist or doctor.  Eat less sugary and starchy foods. Drink less sugary drinks.  Avoid snacking often on sugary and starchy foods. Avoid sipping often on sugary drinks.  Keep regular checkups and cleanings with your dentist.  Use fluoride supplements if told by your dentist or doctor.  Allow fluoride to be applied to teeth if told by your dentist or doctor. MAKE SURE YOU:  Understand these instructions.  Will watch your condition.  Will get help right away if you are not doing well or get worse. Document Released: 02/27/2008 Document Revised: 01/20/2013 Document Reviewed: 05/22/2012 [facility] Patient Information 2014 [place], [place].

## 2014-03-30 ENCOUNTER — Other Ambulatory Visit: Payer: Self-pay | Admitting: Family Medicine

## 2014-03-30 DIAGNOSIS — R945 Abnormal results of liver function studies: Principal | ICD-10-CM

## 2014-03-30 DIAGNOSIS — R7989 Other specified abnormal findings of blood chemistry: Secondary | ICD-10-CM

## 2014-04-06 ENCOUNTER — Encounter (INDEPENDENT_AMBULATORY_CARE_PROVIDER_SITE_OTHER): Payer: Self-pay

## 2014-04-06 ENCOUNTER — Ambulatory Visit
Admission: RE | Admit: 2014-04-06 | Discharge: 2014-04-06 | Disposition: A | Discharge status: Home / Self Care | Payer: Managed Care, Other (non HMO) | Source: Ambulatory Visit | Attending: Family Medicine | Admitting: Family Medicine

## 2014-04-06 DIAGNOSIS — R7989 Other specified abnormal findings of blood chemistry: Secondary | ICD-10-CM

## 2014-04-06 DIAGNOSIS — R945 Abnormal results of liver function studies: Principal | ICD-10-CM

## 2015-12-03 ENCOUNTER — Inpatient Hospital Stay (HOSPITAL_COMMUNITY)
Admission: EM | Admit: 2015-12-03 | Discharge: 2015-12-07 | DRG: 176 | Disposition: A | Discharge status: Home / Self Care | Payer: 59 | Attending: Internal Medicine | Admitting: Internal Medicine

## 2015-12-03 DIAGNOSIS — Z7982 Long term (current) use of aspirin: Secondary | ICD-10-CM

## 2015-12-03 DIAGNOSIS — I129 Hypertensive chronic kidney disease with stage 1 through stage 4 chronic kidney disease, or unspecified chronic kidney disease: Secondary | ICD-10-CM | POA: Diagnosis present

## 2015-12-03 DIAGNOSIS — R52 Pain, unspecified: Secondary | ICD-10-CM

## 2015-12-03 DIAGNOSIS — N183 Chronic kidney disease, stage 3 unspecified: Secondary | ICD-10-CM

## 2015-12-03 DIAGNOSIS — R000 Tachycardia, unspecified: Secondary | ICD-10-CM | POA: Diagnosis present

## 2015-12-03 DIAGNOSIS — I2699 Other pulmonary embolism without acute cor pulmonale: Secondary | ICD-10-CM | POA: Diagnosis not present

## 2015-12-03 DIAGNOSIS — F319 Bipolar disorder, unspecified: Secondary | ICD-10-CM | POA: Diagnosis present

## 2015-12-03 DIAGNOSIS — E7800 Pure hypercholesterolemia, unspecified: Secondary | ICD-10-CM | POA: Diagnosis present

## 2015-12-03 DIAGNOSIS — E119 Type 2 diabetes mellitus without complications: Secondary | ICD-10-CM

## 2015-12-03 DIAGNOSIS — R042 Hemoptysis: Secondary | ICD-10-CM | POA: Diagnosis not present

## 2015-12-03 DIAGNOSIS — E1122 Type 2 diabetes mellitus with diabetic chronic kidney disease: Secondary | ICD-10-CM | POA: Diagnosis present

## 2015-12-03 DIAGNOSIS — I82411 Acute embolism and thrombosis of right femoral vein: Secondary | ICD-10-CM | POA: Insufficient documentation

## 2015-12-03 DIAGNOSIS — Z794 Long term (current) use of insulin: Secondary | ICD-10-CM

## 2015-12-03 DIAGNOSIS — M1711 Unilateral primary osteoarthritis, right knee: Secondary | ICD-10-CM | POA: Diagnosis present

## 2015-12-03 DIAGNOSIS — E785 Hyperlipidemia, unspecified: Secondary | ICD-10-CM | POA: Diagnosis present

## 2015-12-03 HISTORY — DX: Chronic kidney disease, stage 3 (moderate): N18.3

## 2015-12-03 HISTORY — DX: Chronic kidney disease, stage 3 unspecified: N18.30

## 2015-12-04 ENCOUNTER — Observation Stay (HOSPITAL_BASED_OUTPATIENT_CLINIC_OR_DEPARTMENT_OTHER): Payer: 59

## 2015-12-04 ENCOUNTER — Encounter (HOSPITAL_COMMUNITY): Payer: Self-pay | Admitting: Emergency Medicine

## 2015-12-04 ENCOUNTER — Observation Stay (HOSPITAL_COMMUNITY): Payer: 59

## 2015-12-04 ENCOUNTER — Emergency Department (HOSPITAL_COMMUNITY): Payer: 59

## 2015-12-04 DIAGNOSIS — N183 Chronic kidney disease, stage 3 unspecified: Secondary | ICD-10-CM | POA: Diagnosis present

## 2015-12-04 DIAGNOSIS — I2699 Other pulmonary embolism without acute cor pulmonale: Secondary | ICD-10-CM | POA: Diagnosis present

## 2015-12-04 DIAGNOSIS — E119 Type 2 diabetes mellitus without complications: Secondary | ICD-10-CM

## 2015-12-04 DIAGNOSIS — M1711 Unilateral primary osteoarthritis, right knee: Secondary | ICD-10-CM | POA: Diagnosis present

## 2015-12-04 DIAGNOSIS — F312 Bipolar disorder, current episode manic severe with psychotic features: Secondary | ICD-10-CM | POA: Diagnosis not present

## 2015-12-04 DIAGNOSIS — I82411 Acute embolism and thrombosis of right femoral vein: Secondary | ICD-10-CM | POA: Insufficient documentation

## 2015-12-04 DIAGNOSIS — M7989 Other specified soft tissue disorders: Secondary | ICD-10-CM

## 2015-12-04 DIAGNOSIS — E7800 Pure hypercholesterolemia, unspecified: Secondary | ICD-10-CM | POA: Diagnosis present

## 2015-12-04 DIAGNOSIS — F319 Bipolar disorder, unspecified: Secondary | ICD-10-CM | POA: Diagnosis present

## 2015-12-04 DIAGNOSIS — E785 Hyperlipidemia, unspecified: Secondary | ICD-10-CM | POA: Diagnosis present

## 2015-12-04 DIAGNOSIS — R042 Hemoptysis: Secondary | ICD-10-CM | POA: Diagnosis present

## 2015-12-04 DIAGNOSIS — Z794 Long term (current) use of insulin: Secondary | ICD-10-CM | POA: Diagnosis not present

## 2015-12-04 DIAGNOSIS — I129 Hypertensive chronic kidney disease with stage 1 through stage 4 chronic kidney disease, or unspecified chronic kidney disease: Secondary | ICD-10-CM | POA: Diagnosis present

## 2015-12-04 DIAGNOSIS — I2609 Other pulmonary embolism with acute cor pulmonale: Secondary | ICD-10-CM | POA: Diagnosis not present

## 2015-12-04 DIAGNOSIS — R000 Tachycardia, unspecified: Secondary | ICD-10-CM | POA: Diagnosis present

## 2015-12-04 DIAGNOSIS — E1122 Type 2 diabetes mellitus with diabetic chronic kidney disease: Secondary | ICD-10-CM | POA: Diagnosis present

## 2015-12-04 DIAGNOSIS — Z7982 Long term (current) use of aspirin: Secondary | ICD-10-CM | POA: Diagnosis not present

## 2015-12-04 LAB — ECHOCARDIOGRAM COMPLETE
AVLVOTPG: 5 mmHg
E/e' ratio: 12.08
EWDT: 116 ms
FS: 30 % (ref 28–44)
HEIGHTINCHES: 72 in
IVS/LV PW RATIO, ED: 1.11
LA ID, A-P, ES: 47 mm
LA diam end sys: 47 mm
LA diam index: 1.82 cm/m2
LA vol index: 29.7 mL/m2
LA vol: 76.6 mL
LAVOLA4C: 74.6 mL
LV E/e' medial: 12.08
LV SIMPSON'S DISK: 59
LV TDI E'LATERAL: 3.42
LV TDI E'MEDIAL: 2.72
LV dias vol index: 38 mL/m2
LV e' LATERAL: 3.42 cm/s
LVDIAVOL: 98 mL (ref 62–150)
LVEEAVG: 12.08
LVOT SV: 98 mL
LVOT VTI: 25.9 cm
LVOT area: 3.8 cm2
LVOT diameter: 22 mm
LVOT peak vel: 114 cm/s
LVSYSVOL: 40 mL (ref 21–61)
LVSYSVOLIN: 16 mL/m2
MV Dec: 116
MVPKAVEL: 150 m/s
MVPKEVEL: 41.3 m/s
PW: 12.1 mm — AB (ref 0.6–1.1)
RV TAPSE: 24.8 mm
Stroke v: 58 ml
WEIGHTICAEL: 5033.54 [oz_av]

## 2015-12-04 LAB — PROTIME-INR
INR: 1.24 (ref 0.00–1.49)
PROTHROMBIN TIME: 15.2 s (ref 11.6–15.2)

## 2015-12-04 LAB — COMPREHENSIVE METABOLIC PANEL
ALT: 49 U/L (ref 17–63)
AST: 79 U/L — ABNORMAL HIGH (ref 15–41)
Albumin: 4.2 g/dL (ref 3.5–5.0)
Alkaline Phosphatase: 40 U/L (ref 38–126)
Anion gap: 10 (ref 5–15)
BUN: 26 mg/dL — ABNORMAL HIGH (ref 6–20)
CO2: 22 mmol/L (ref 22–32)
CREATININE: 1.56 mg/dL — AB (ref 0.61–1.24)
Calcium: 9.5 mg/dL (ref 8.9–10.3)
Chloride: 109 mmol/L (ref 101–111)
GFR calc non Af Amer: 50 mL/min — ABNORMAL LOW (ref >60)
GFR, EST AFRICAN AMERICAN: 58 mL/min — AB (ref >60)
Glucose, Bld: 109 mg/dL — ABNORMAL HIGH (ref 65–99)
POTASSIUM: 3.7 mmol/L (ref 3.5–5.1)
SODIUM: 141 mmol/L (ref 135–145)
Total Bilirubin: 1 mg/dL (ref 0.3–1.2)
Total Protein: 7.4 g/dL (ref 6.5–8.1)

## 2015-12-04 LAB — CBC WITH DIFFERENTIAL/PLATELET
BASOS ABS: 0 10*3/uL (ref 0.0–0.1)
BASOS PCT: 0 %
EOS PCT: 0 %
Eosinophils Absolute: 0 10*3/uL (ref 0.0–0.7)
HCT: 38.3 % — ABNORMAL LOW (ref 39.0–52.0)
Hemoglobin: 12.7 g/dL — ABNORMAL LOW (ref 13.0–17.0)
Lymphocytes Relative: 23 %
Lymphs Abs: 1.1 10*3/uL (ref 0.7–4.0)
MCH: 28.6 pg (ref 26.0–34.0)
MCHC: 33.2 g/dL (ref 30.0–36.0)
MCV: 86.3 fL (ref 78.0–100.0)
Monocytes Absolute: 0.6 10*3/uL (ref 0.1–1.0)
Monocytes Relative: 13 %
NEUTROS ABS: 3.1 10*3/uL (ref 1.7–7.7)
Neutrophils Relative %: 64 %
PLATELETS: 222 10*3/uL (ref 150–400)
RBC: 4.44 MIL/uL (ref 4.22–5.81)
RDW: 12.5 % (ref 11.5–15.5)
WBC: 4.9 10*3/uL (ref 4.0–10.5)

## 2015-12-04 LAB — TROPONIN I
TROPONIN I: 0.04 ng/mL — AB (ref <0.03)
Troponin I: 0.04 ng/mL (ref <0.03)
Troponin I: 0.03 ng/mL (ref <0.03)

## 2015-12-04 LAB — HEPARIN LEVEL (UNFRACTIONATED)
Heparin Unfractionated: 0.35 IU/mL (ref 0.30–0.70)
Heparin Unfractionated: 0.3 IU/mL (ref 0.30–0.70)

## 2015-12-04 LAB — D-DIMER, QUANTITATIVE (NOT AT ARMC): D DIMER QUANT: 10.28 ug{FEU}/mL — AB (ref 0.00–0.50)

## 2015-12-04 LAB — CBC
HCT: 37.3 % — ABNORMAL LOW (ref 39.0–52.0)
HEMOGLOBIN: 12.4 g/dL — AB (ref 13.0–17.0)
MCH: 28.8 pg (ref 26.0–34.0)
MCHC: 33.2 g/dL (ref 30.0–36.0)
MCV: 86.7 fL (ref 78.0–100.0)
PLATELETS: 229 10*3/uL (ref 150–400)
RBC: 4.3 MIL/uL (ref 4.22–5.81)
RDW: 12.6 % (ref 11.5–15.5)
WBC: 5 10*3/uL (ref 4.0–10.5)

## 2015-12-04 LAB — MRSA PCR SCREENING: MRSA by PCR: NEGATIVE

## 2015-12-04 LAB — GLUCOSE, CAPILLARY
GLUCOSE-CAPILLARY: 99 mg/dL (ref 65–99)
Glucose-Capillary: 134 mg/dL — ABNORMAL HIGH (ref 65–99)
Glucose-Capillary: 142 mg/dL — ABNORMAL HIGH (ref 65–99)
Glucose-Capillary: 118 mg/dL — ABNORMAL HIGH (ref 65–99)

## 2015-12-04 LAB — BRAIN NATRIURETIC PEPTIDE: B NATRIURETIC PEPTIDE 5: 12.2 pg/mL (ref 0.0–100.0)

## 2015-12-04 LAB — ANTITHROMBIN III: ANTITHROMB III FUNC: 98 % (ref 75–120)

## 2015-12-04 LAB — APTT: APTT: 35 s (ref 24–37)

## 2015-12-04 MED ORDER — ASPIRIN EC 81 MG PO TBEC: 81.0000 mg | DELAYED_RELEASE_TABLET | Freq: Every day | ORAL | Status: DC

## 2015-12-04 MED ORDER — CARBAMAZEPINE 200 MG PO TABS
600.0000 mg | ORAL_TABLET | Freq: Every day | ORAL | Status: DC
  Administered 2015-12-04: 600 mg via ORAL
  Filled 2015-12-04: qty 3

## 2015-12-04 MED ORDER — OXYCODONE-ACETAMINOPHEN 5-325 MG PO TABS
2.0000 | ORAL_TABLET | Freq: Four times a day (QID) | ORAL | Status: DC | PRN
  Administered 2015-12-05: 2 via ORAL
  Filled 2015-12-04: qty 2

## 2015-12-04 MED ORDER — SODIUM CHLORIDE 0.9 % IV SOLN
INTRAVENOUS | Status: DC
  Administered 2015-12-04 (×2): via INTRAVENOUS

## 2015-12-04 MED ORDER — ATORVASTATIN CALCIUM 40 MG PO TABS
40.0000 mg | ORAL_TABLET | Freq: Every day | ORAL | Status: DC
  Administered 2015-12-04 – 2015-12-07 (×4): 40 mg via ORAL
  Filled 2015-12-04 (×4): qty 1

## 2015-12-04 MED ORDER — ADULT MULTIVITAMIN W/MINERALS CH
1.0000 | ORAL_TABLET | Freq: Every day | ORAL | Status: DC
  Administered 2015-12-04 – 2015-12-07 (×4): 1 via ORAL
  Filled 2015-12-04 (×4): qty 1

## 2015-12-04 MED ORDER — INSULIN GLARGINE 100 UNIT/ML ~~LOC~~ SOLN
50.0000 [IU] | Freq: Every day | SUBCUTANEOUS | Status: DC
  Administered 2015-12-04 – 2015-12-07 (×4): 50 [IU] via SUBCUTANEOUS
  Filled 2015-12-04 (×5): qty 0.5

## 2015-12-04 MED ORDER — CARBAMAZEPINE 200 MG PO TABS
400.0000 mg | ORAL_TABLET | Freq: Every day | ORAL | Status: DC
  Administered 2015-12-04: 400 mg via ORAL
  Filled 2015-12-04 (×2): qty 2

## 2015-12-04 MED ORDER — ACETAMINOPHEN 325 MG PO TABS: 650.0000 mg | ORAL_TABLET | Freq: Four times a day (QID) | ORAL | Status: DC | PRN

## 2015-12-04 MED ORDER — SODIUM CHLORIDE 0.9 % IV BOLUS (SEPSIS)
1000.0000 mL | Freq: Once | INTRAVENOUS | Status: AC
  Administered 2015-12-04: 1000 mL via INTRAVENOUS

## 2015-12-04 MED ORDER — TEMAZEPAM 15 MG PO CAPS
15.0000 mg | ORAL_CAPSULE | Freq: Every evening | ORAL | Status: DC | PRN
  Administered 2015-12-05 – 2015-12-06 (×3): 15 mg via ORAL
  Filled 2015-12-04 (×3): qty 1

## 2015-12-04 MED ORDER — ENALAPRIL MALEATE 5 MG PO TABS
5.0000 mg | ORAL_TABLET | Freq: Two times a day (BID) | ORAL | Status: DC
  Administered 2015-12-04 – 2015-12-07 (×7): 5 mg via ORAL
  Filled 2015-12-04 (×7): qty 1

## 2015-12-04 MED ORDER — ARIPIPRAZOLE 5 MG PO TABS
30.0000 mg | ORAL_TABLET | Freq: Every evening | ORAL | Status: DC
  Administered 2015-12-04 – 2015-12-06 (×3): 30 mg via ORAL
  Filled 2015-12-04: qty 6
  Filled 2015-12-04: qty 2
  Filled 2015-12-04: qty 6

## 2015-12-04 MED ORDER — QUETIAPINE FUMARATE 200 MG PO TABS
800.0000 mg | ORAL_TABLET | Freq: Every day | ORAL | Status: DC
  Administered 2015-12-04 – 2015-12-06 (×3): 800 mg via ORAL
  Filled 2015-12-04 (×3): qty 4
  Filled 2015-12-04: qty 8

## 2015-12-04 MED ORDER — INSULIN ASPART 100 UNIT/ML ~~LOC~~ SOLN
0.0000 [IU] | Freq: Three times a day (TID) | SUBCUTANEOUS | Status: DC
  Administered 2015-12-04 – 2015-12-06 (×5): 2 [IU] via SUBCUTANEOUS

## 2015-12-04 MED ORDER — LAMOTRIGINE 100 MG PO TABS
400.0000 mg | ORAL_TABLET | Freq: Every day | ORAL | Status: DC
  Administered 2015-12-04 – 2015-12-06 (×3): 400 mg via ORAL
  Filled 2015-12-04 (×2): qty 4
  Filled 2015-12-04: qty 2

## 2015-12-04 MED ORDER — HEPARIN BOLUS VIA INFUSION
4000.0000 [IU] | Freq: Once | INTRAVENOUS | Status: AC
  Administered 2015-12-04: 4000 [IU] via INTRAVENOUS
  Filled 2015-12-04: qty 4000

## 2015-12-04 MED ORDER — SODIUM CHLORIDE 0.9% FLUSH
3.0000 mL | Freq: Two times a day (BID) | INTRAVENOUS | Status: DC
  Administered 2015-12-04 – 2015-12-06 (×5): 3 mL via INTRAVENOUS

## 2015-12-04 MED ORDER — FENOFIBRATE 160 MG PO TABS
160.0000 mg | ORAL_TABLET | Freq: Every day | ORAL | Status: DC
  Administered 2015-12-04 – 2015-12-07 (×4): 160 mg via ORAL
  Filled 2015-12-04 (×4): qty 1

## 2015-12-04 MED ORDER — ONDANSETRON HCL 4 MG/2ML IJ SOLN: 4.0000 mg | Freq: Three times a day (TID) | INTRAMUSCULAR | Status: AC | PRN

## 2015-12-04 MED ORDER — ACETAMINOPHEN 650 MG RE SUPP: 650.0000 mg | Freq: Four times a day (QID) | RECTAL | Status: DC | PRN

## 2015-12-04 MED ORDER — IOPAMIDOL (ISOVUE-370) INJECTION 76%
100.0000 mL | Freq: Once | INTRAVENOUS | Status: AC | PRN
  Administered 2015-12-04: 100 mL via INTRAVENOUS

## 2015-12-04 MED ORDER — HEPARIN (PORCINE) IN NACL 100-0.45 UNIT/ML-% IJ SOLN
1950.0000 [IU]/h | INTRAMUSCULAR | Status: AC
  Administered 2015-12-04 (×2): 1850 [IU]/h via INTRAVENOUS
  Administered 2015-12-05: 1900 [IU]/h via INTRAVENOUS
  Filled 2015-12-04 (×5): qty 250

## 2015-12-04 NOTE — ED Notes (Signed)
Pt states he has been coughing up blood tonight  Pt states he has filled up about 2 dixie cups  Pt states he has been spraying a powder chemical for bugs without wearing a mask or respirator

## 2015-12-04 NOTE — Progress Notes (Signed)
Echocardiogram 2D Echocardiogram has been performed.  Cameron [name] 12/04/2015, 12:27 PM

## 2015-12-04 NOTE — Progress Notes (Signed)
ANTICOAGULATION CONSULT NOTE - Follow Up Consult  Pharmacy Consult for Heparin Indication: pulmonary embolus  No Active Allergies  Patient Measurements: Height: 6' (182.9 cm) Weight: (!) 314 lb 9.5 oz (142.7 kg) IBW/kg (Calculated) : 77.6 Heparin Dosing Weight: 109 kg  Vital Signs: Temp: 99 F (37.2 C) (07/03 0800) Temp Source: Oral (07/03 0800) BP: 135/84 mmHg (07/03 0552) Pulse Rate: 104 (07/03 0552)  Labs:  Recent Labs  12/04/15 0129 12/04/15 0526  HGB 12.7* 12.4*  HCT 38.3* 37.3*  PLT 222 229  APTT  --  35  LABPROT 15.2  --   INR 1.24  --   CREATININE 1.56*  --     Estimated Creatinine Clearance: 82.1 mL/min (by C-G formula based on Cr of 1.56).   Medical History: Past Medical History  Diagnosis Date  . Bipolar 1 disorder ([place])   . Diabetes mellitus without complication ([place])   . Hypertension   . Hypercholesteremia   . CKD (chronic kidney disease), stage III     Assessment: 50 yr male presents with complaint of hemoptysis.  Pt also had right lower leg pitting edema.  Reports an old injury and was to see orthopedist soon.  Had elevated D-dimer.  CTAngio = + bilateral pulmonary embolism with right heart strain. Pharmacy consulted to dose IV heparin. Patient on no oral anticoagulation PTA (baseline INR = 1.24, aPTT 35 sec).  Today, 12/04/2015: - First heparin level therapeutic at 0.35 with infusion at 1850 units/hr - Hgb stable, platelets WNL - Hypercoag panel in process - MD documents no further episodes of hemoptysis since yesterday. Plans to transition to NOAC tomorrow.  Goal of Therapy:  Heparin level 0.3-0.7 units/ml Monitor platelets by anticoagulation protocol: Yes   Plan:  Continue heparin infusion at 1850 units/hr. Recheck heparin level this evening to confirm rate. Daily HL and CBC while on heparin infusion. F/u plans for transition to oral anticoagulant.  Hershal Coria 12/04/2015,9:35 AM

## 2015-12-04 NOTE — H&P (Signed)
History and Physical    Cameron [name] [id number] DOB: 11/23/1965 DOA: 12/03/2015  Referring MD/NP/PA:   PCP: [name], MD   Patient coming from:  The patient is coming from home.  At baseline, pt is independent for his ADL.     Chief Complaint: Hemoptysis and right leg swelling  HPI: Cameron [name] is a 50 y.o. male with medical history significant of hypertension, hyperlipidemia, diabetes mellitus, bipolar disorder, CKD-III, who presents with hemoptysis and right leg swelling.  Pt states that he sprayed a fine chalk powder to dry out bugs at his home earlier today, without wearing mask protection. After that he started coughing up dark colored blood. He expelled 2 small cups of dark red sputum per pt. Patient denies chest pain, shortness of breath, fever, chills. No nausea, vomiting, diarrhea, abdominal pain, symptoms of UTI or unilateral weakness. Of note, patient reports that in the past 5 days, he has been having right lower leg swelling, but no pain. His right lower leg is erythematous. No tenderness over calf areas. No recent long distance traveling. He states that he has right knee pain for several months, is seeing orthopedic surgery next week.  ED Course: pt was found to have elevated d-dimer 2.28, INR 1.24, WBC 4.9, temperature 100, tachycardia, oxygen saturation desaturation to 92% on room air, stable renal function. CT angiogram of chest showed scattered pulmonary emboli within the left main pulmonary artery, and within segmental pulmonary arteries on the right, extending distally into subsegmental branches in all lobes of both lungs, has CTevidence of right heart strain. Patient is placed on stepdown bed for observation  Review of Systems:   General: no fevers, chills, no changes in body weight, has fatigue HEENT: no blurry vision, hearing changes or sore throat Pulm: no dyspnea, wheezing. Has hemoptysis. CV: no chest pain, no palpitations Abd: no nausea, vomiting,  abdominal pain, diarrhea, constipation GU: no dysuria, burning on urination, increased urinary frequency, hematuria  Ext: has right leg swelling.  Neuro: no unilateral weakness, numbness, or tingling, no vision change or hearing loss Skin: no rash MSK: No muscle spasm, no deformity, no limitation of range of movement in spin. Has right knee pain. Heme: No easy bruising.  Travel history: No recent long distant travel.  Allergy:  Allergies  Allergen Reactions  . Aspirin Other (See Comments)    Patient tolerates LOW DOSE ASPIRIN.    Past Medical History  Diagnosis Date  . Bipolar 1 disorder ([place])   . Diabetes mellitus without complication ([place])   . Hypertension   . Hypercholesteremia   . CKD (chronic kidney disease), stage III     Past Surgical History  Procedure Laterality Date  . Appendectomy      Social History:  reports that he has never smoked. He does not have any smokeless tobacco history on file. He reports that he drinks alcohol. He reports that he does not use illicit drugs.  Family History:  Family History  Problem Relation Age of Onset  . Stroke Other   . Diabetes Other      Prior to Admission medications   Medication Sig Start Date End Date Taking? Authorizing Provider  ARIPiprazole (ABILIFY) 30 MG tablet Take 30 mg by mouth every evening.    Yes Historical Provider, MD  aspirin EC 81 MG tablet Take 81 mg by mouth daily.   Yes Historical Provider, MD  atorvastatin (LIPITOR) 40 MG tablet Take 40 mg by mouth daily. 11/04/15  Yes Historical Provider, MD  Canagliflozin (INVOKANA) 100 MG TABS Take 100 mg by mouth daily with breakfast.    Yes Historical Provider, MD  carbamazepine (TEGRETOL) 200 MG tablet Take 400-600 mg by mouth 2 (two) times daily. Take 2 tablets in the morning Take 3 tablets in the evening   Yes Historical Provider, MD  enalapril (VASOTEC) 5 MG tablet Take 5 mg by mouth 2 (two) times daily.   Yes Historical Provider, MD  fenofibrate 160 MG tablet  Take 160 mg by mouth daily.   Yes Historical Provider, MD  Insulin Glargine (LANTUS SOLOSTAR) 100 UNIT/ML Solostar Pen Inject 60 Units into the skin daily.    Yes Historical Provider, MD  lamoTRIgine (LAMICTAL) 100 MG tablet Take 400 mg by mouth at bedtime.   Yes Historical Provider, MD  Multiple Vitamin (MULTIVITAMIN WITH MINERALS) TABS tablet Take 1 tablet by mouth daily.   Yes Historical Provider, MD  QUEtiapine (SEROQUEL) 400 MG tablet Take 800 mg by mouth at bedtime. 12/02/15  Yes Historical Provider, MD  TANZEUM 50 MG PEN Inject 50 mg into the skin every 7 (seven) days. 11/17/15  Yes Historical Provider, MD  temazepam (RESTORIL) 15 MG capsule Take 15 mg by mouth at bedtime as needed for sleep.   Yes Historical Provider, MD    Physical Exam: Filed Vitals:   12/04/15 0012 12/04/15 0257 12/04/15 0344  BP: 127/90 139/79 124/74  Pulse: 101 91 99  Temp: 100 F (37.8 C)    TempSrc: Oral    Resp: 18 18   Height: 6' (1.829 m)    Weight: 136.079 kg (300 lb)    SpO2: 92% 94% 95%   General: Not in acute distress HEENT:       Eyes: PERRL, EOMI, no scleral icterus.       ENT: No discharge from the ears and nose, no pharynx injection, no tonsillar enlargement.        Neck: No JVD, no bruit, no mass felt. Heme: No neck lymph node enlargement. Cardiac: S1/S2, RRR, No murmurs, No gallops or rubs. Pulm: No rales, wheezing, rhonchi or rubs. Abd: Soft, nondistended, nontender, no rebound pain, no organomegaly, BS present. GU: No hematuria Ext:  2+DP/PT pulse bilaterally. Has right lower leg swelling and redness, no tenderness. Musculoskeletal: No joint deformities, No joint redness or warmth, no limitation of ROM in spin. Skin: No rashes.  Neuro: Alert, oriented X3, cranial nerves II-XII grossly intact, moves all extremities normally.  Psych: Patient is not psychotic, no suicidal or hemocidal ideation.  Labs on Admission: I have personally reviewed following labs and imaging  studies  CBC:  Recent Labs Lab 12/04/15 0129  WBC 4.9  NEUTROABS 3.1  HGB 12.7*  HCT 38.3*  MCV 86.3  PLT [id number]   Basic Metabolic Panel:  Recent Labs Lab 12/04/15 0129  NA 141  K 3.7  CL 109  CO2 22  GLUCOSE 109*  BUN 26*  CREATININE 1.56*  CALCIUM 9.5   GFR: Estimated Creatinine Clearance: 80 mL/min (by C-G formula based on Cr of 1.56). Liver Function Tests:  Recent Labs Lab 12/04/15 0129  AST 79*  ALT 49  ALKPHOS 40  BILITOT 1.0  PROT 7.4  ALBUMIN 4.2   No results for input(s): LIPASE, AMYLASE in the last 168 hours. No results for input(s): AMMONIA in the last 168 hours. Coagulation Profile:  Recent Labs Lab 12/04/15 0129  INR 1.24   Cardiac Enzymes: No results for input(s): CKTOTAL, CKMB, CKMBINDEX, TROPONINI in the last 168 hours. BNP (last 3  results) No results for input(s): PROBNP in the last 8760 hours. HbA1C: No results for input(s): HGBA1C in the last 72 hours. CBG: No results for input(s): GLUCAP in the last 168 hours. Lipid Profile: No results for input(s): CHOL, HDL, LDLCALC, TRIG, CHOLHDL, LDLDIRECT in the last 72 hours. Thyroid Function Tests: No results for input(s): TSH, T4TOTAL, FREET4, T3FREE, THYROIDAB in the last 72 hours. Anemia Panel: No results for input(s): VITAMINB12, FOLATE, FERRITIN, TIBC, IRON, RETICCTPCT in the last 72 hours. Urine analysis:    Component Value Date/Time   COLORURINE YELLOW 08/24/2010 2029   APPEARANCEUR CLEAR 08/24/2010 2029   LABSPEC 1.025 08/24/2010 2029   PHURINE 5.0 08/24/2010 2029   GLUCOSEU >1000* 08/24/2010 2029   HGBUR NEGATIVE 08/24/2010 2029   BILIRUBINUR NEGATIVE 08/24/2010 2029   KETONESUR >80* 08/24/2010 2029   PROTEINUR NEGATIVE 08/24/2010 2029   UROBILINOGEN 0.2 08/24/2010 2029   NITRITE NEGATIVE 08/24/2010 2029   LEUKOCYTESUR NEGATIVE 08/24/2010 2029   Sepsis Labs: @LABRCNTIP (procalcitonin:4,lacticidven:4) )No results found for this or any previous visit (from the past 240  hour(s)).   Radiological Exams on Admission: Dg Chest 2 View  12/04/2015  CLINICAL DATA:  Hemoptysis. EXAM: CHEST  2 VIEW COMPARISON:  03/15/2008 FINDINGS: The cardiomediastinal contours are unchanged, heart at the upper limits of normal in size. The lungs are clear. Pulmonary vasculature is normal. No consolidation, pleural effusion, or pneumothorax. No acute osseous abnormalities are seen. IMPRESSION: No acute pulmonary process. Electronically Signed   By: Jeb Levering M.D.   On: 12/04/2015 01:42   Ct Angio Chest Pe W Or Wo Contrast  12/04/2015  CLINICAL DATA:  Acute onset of hemoptysis after spraying chalk powder for bugs. Elevated D-dimer. Initial encounter. EXAM: CT ANGIOGRAPHY CHEST WITH CONTRAST TECHNIQUE: Multidetector CT imaging of the chest was performed using the standard protocol during bolus administration of intravenous contrast. Multiplanar CT image reconstructions and MIPs were obtained to evaluate the vascular anatomy. CONTRAST:  100 mL of Isovue 370 IV contrast COMPARISON:  Chest radiograph performed earlier today at 12:54 a.m. FINDINGS: Scattered pulmonary emboli are noted within the left main pulmonary artery, and within segmental pulmonary arteries on the right. These extend distally into subsegmental branches bilaterally, within all lobes of both lungs. The RV/LV ratio of 1.1 corresponds to right heart strain and at least submassive pulmonary embolus. Mild bibasilar atelectasis is noted. The lungs are otherwise clear. There is no evidence of significant focal consolidation, pleural effusion or pneumothorax. No masses are identified; no abnormal focal contrast enhancement is seen. The mediastinum is otherwise grossly unremarkable. No mediastinal lymphadenopathy is seen. No pericardial effusion is identified. The great vessels are grossly unremarkable in appearance. No axillary lymphadenopathy is seen. The visualized portions of the thyroid gland are unremarkable in appearance. The  visualized portions of the liver and spleen are unremarkable. No acute osseous abnormalities are seen. Review of the MIP images confirms the above findings. IMPRESSION: 1. Scattered pulmonary emboli within the left main pulmonary artery, and within segmental pulmonary arteries on the right, extending distally into subsegmental branches in all lobes of both lungs. CT evidence of right heart strain (RV/LV Ratio = 1.1) consistent with at least submassive (intermediate risk) PE. The presence of right heart strain has been associated with an increased risk of morbidity and mortality. Please activate Code PE by paging [phone]. 2. Mild bibasilar atelectasis noted.  Lungs otherwise clear. Critical Value/emergent results were called by telephone at the time of interpretation on 12/04/2015 at 3:57 am to Dr. [name],  who verbally acknowledged these results. Electronically Signed   By: Garald Balding M.D.   On: 12/04/2015 03:59     EKG: Independently reviewed. Sinus rhythm, QTC 441, LAE, no ischemic change.  Assessment/Plan Principal Problem:   Pulmonary embolism (HCC) Active Problems:   Right leg swelling   Right knee pain   Bipolar 1 disorder (HCC)   Diabetes mellitus without complication (HCC)   Hypercholesteremia   CKD (chronic kidney disease), stage III   PE (pulmonary embolism)   Pulmonary embolism ([place]): pt's hemoptysis is due to pulmonary embolism as evidenced by the CT angiogram of the chest. Patient has right heart straining by CTA, indicating submassive PE. His right lower leg swelling is most likely due to DVT. Patient does not have chest pain or leg pain. No shortness of breath, but has O2 desaturated to 92% on room air. Currently hemodynamically stable. It is physician discussed that with the PCCM, Dr. Corinna Lines, who recommended reconsultation if patient has right heart straining by 2-D echo or clinically deteriorating.  -will place on stepdown for close monitoring and obs -heparin drip  initiated -2D echocardiogram ordered -LE dopplers ordered to evaluate for DVT -Trop x 3 -Hypercoag panel -pain control: When necessary Percocet if develops pain -NPO -IVF: 2L NS in ED, then 75 cc/h  Right knee pain: has appointment with orthopedic surgeon next week -Follow-up is orthopedic surgeon -When necessary Tylenol and Percocet for pain  DM-II: Last A1c 12.7 on 08/24/10, poorly controled. Patient is taking Tanzeum, Invokona and Lantus at home -will decrease Lantus dose from 80 to 50 units daily -SSI-moderate scale -Check A1c  HLD: Last LDL was not on record -Continue home medications: Lipitor and fenofibrate -Check FLP  CDK-III: stable. Baseline creatinine 1.4-1.7. His creatinine is 1.56. -Follow-up renal function. BMP  Hypertension: -Continue enalapril  Bipolar 1 disorder and depression: Stable, no suicidal or homicidal ideations. -Continue home medications: Ambien 85, Lamictal, Seroquel   DVT ppx: on IV Heparin  Code Status: Full code Family Communication: Yes, patient's wife and daughter at bed side Disposition Plan:  Anticipate discharge back to previous home environment Consults called:  EDP discussed that with PCCM, Dr. Adelene Amas re-consult if needed. Admission status: Obs / tele  Inpatient/tele   medical floor/obs     SDU/inpation       Date of Service 12/04/2015    Ivor Costa [facility] Pager [phone]  If 7PM-7AM, please contact night-coverage [email or web address] Password [email or web address] 12/04/2015, 5:22 AM

## 2015-12-04 NOTE — ED Notes (Signed)
Pt states he sprayed bed room for bugs today using Diatomaceous Earth, food grace bug killer. Pt describes as fine white powder. Pt states he did not use mask or respirator and does recall inhaling powder. Tonight pt began coughing, noted "gurgling" sound with breathing. Pt states he expelled 2 small cups of dark red sputum. Pt denies shob, denies pain.  Pt's R lower leg noted to have +1 pitting edema to knee. Redness noted with small red areas, family states these occurred after moving items from under bed and moving items around bedroom and cleaning.  Pt c/o R knee pain, swelling noted, pt states he does have old injury he is seeing ortho next week.

## 2015-12-04 NOTE — Progress Notes (Signed)
ANTICOAGULATION CONSULT NOTE - Follow Up Consult  Pharmacy Consult for Heparin Indication: pulmonary embolus  No Active Allergies  Patient Measurements: Height: 6' (182.9 cm) Weight: (!) 314 lb 9.5 oz (142.7 kg) IBW/kg (Calculated) : 77.6 Heparin Dosing Weight: 109 kg  Vital Signs: Temp: 98.5 F (36.9 C) (07/03 1200) Temp Source: Oral (07/03 1200) BP: 135/84 mmHg (07/03 0552) Pulse Rate: 104 (07/03 0552)  Labs:  Recent Labs  12/04/15 0129 12/04/15 0526 12/04/15 1009 12/04/15 1703  HGB 12.7* 12.4*  --   --   HCT 38.3* 37.3*  --   --   PLT 222 229  --   --   APTT  --  35  --   --   LABPROT 15.2  --   --   --   INR 1.24  --   --   --   HEPARINUNFRC  --   --  0.35 0.30  CREATININE 1.56*  --   --   --   TROPONINI  --   --  0.04*  --     Estimated Creatinine Clearance: 82.1 mL/min (by C-G formula based on Cr of 1.56).   Medical History: Past Medical History  Diagnosis Date  . Bipolar 1 disorder ([place])   . Diabetes mellitus without complication ([place])   . Hypertension   . Hypercholesteremia   . CKD (chronic kidney disease), stage III     Assessment: 50 yr male presents with complaint of hemoptysis.  Pt also had right lower leg pitting edema.  Reports an old injury and was to see orthopedist soon.  Had elevated D-dimer.  CTAngio = + bilateral pulmonary embolism with right heart strain. Pharmacy consulted to dose IV heparin. Patient on no oral anticoagulation PTA (baseline INR = 1.24, aPTT 35 sec).  Today, 12/04/2015: - 2nd heparin level low end of therapeutic at 0.30 at 1850 units/hr (no issues per RN) - First heparin level therapeutic at 0.35 with infusion at 1850 units/hr - Hgb stable, platelets WNL - Hypercoag panel in process - MD documents no further episodes of hemoptysis since yesterday. Plans to transition to NOAC tomorrow.  Goal of Therapy:  Heparin level 0.3-0.7 units/ml Monitor platelets by anticoagulation protocol: Yes   Plan:  increase heparin  infusion to 1900 units/hr. Daily HL and CBC while on heparin infusion. F/u plans for transition to oral anticoagulant.  Dolly Rias RPh 12/04/2015, 5:45 PM Pager [phone]

## 2015-12-04 NOTE — ED Provider Notes (Signed)
CSN: [id number]     Arrival date & time 12/03/15  2350 History  By signing my name below, I, Cameron [name], attest that this documentation has been prepared under the direction and in the presence of [name], MD . Electronically Signed: Higinio [name], Scribe. 12/04/2015. 12:56 AM.   Chief Complaint  Patient presents with  . Hemoptysis   The history is provided by the patient. No language interpreter was used.   HPI Comments: Cameron [name] is a 50 y.o. male with PMHx of DM, HTN, HLD, and bipolar 1 disorder, who presents to the Emergency Department complaining of multiple episodes of gradual onset, hemoptysis with dark blood that began ~1 hour PTA. Pt states it has been ~2 hours since his last episode of hemoptysis. Pt notes he has been spraying a fine chalk powder to dry out bugs at his home earlier today. Pt states he was not wearing any mouth or nose protection; he describes the dispenser as "a long rod with a little bulb and sprayer." Pt notes he could direct the powder where he wanted it; though, a large cloud of powder could be seen if he pressed the lever too hard. He denies fever, vomiting, and diarrhea. Pt also denies hx of emphysema, pneumonia, and use of cigarettes or recreational drugs. Pt also reports he has been experiencing gradually worsening, right knee pain for a few months; he states he noticed redness on his right leg a few days ago. Pt states he is usually sitting at his desk at work. He denies chills, recent long travel, injury, surgeries, and new prescribed medications. Pt states he takes 81 mg of asprin a day.   Past Medical History  Diagnosis Date  . Bipolar 1 disorder ([place])   . Diabetes mellitus without complication ([place])   . Hypertension   . Hypercholesteremia    Past Surgical History  Procedure Laterality Date  . Appendectomy     Family History  Problem Relation Age of Onset  . Stroke Other   . Diabetes Other    Social History  Substance Use Topics  . Smoking  status: Never Smoker   . Smokeless tobacco: None  . Alcohol Use: Yes     Comment: rarely    Review of Systems  Constitutional: Negative for fever and chills.  Cardiovascular: Positive for leg swelling.  Gastrointestinal: Negative for vomiting and diarrhea.   Allergies  Aspirin  Home Medications   Prior to Admission medications   Medication Sig Start Date End Date Taking? Authorizing Provider  ARIPiprazole (ABILIFY) 30 MG tablet Take 30 mg by mouth every evening.    Yes Historical Provider, MD  aspirin EC 81 MG tablet Take 81 mg by mouth daily.   Yes Historical Provider, MD  atorvastatin (LIPITOR) 40 MG tablet Take 40 mg by mouth daily. 11/04/15  Yes Historical Provider, MD  Canagliflozin (INVOKANA) 100 MG TABS Take 100 mg by mouth daily with breakfast.    Yes Historical Provider, MD  carbamazepine (TEGRETOL) 200 MG tablet Take 400-600 mg by mouth 2 (two) times daily. Take 2 tablets in the morning Take 3 tablets in the evening   Yes Historical Provider, MD  enalapril (VASOTEC) 5 MG tablet Take 5 mg by mouth 2 (two) times daily.   Yes Historical Provider, MD  fenofibrate 160 MG tablet Take 160 mg by mouth daily.   Yes Historical Provider, MD  Insulin Glargine (LANTUS SOLOSTAR) 100 UNIT/ML Solostar Pen Inject 60 Units into the skin daily.  Yes Historical Provider, MD  lamoTRIgine (LAMICTAL) 100 MG tablet Take 400 mg by mouth at bedtime.   Yes Historical Provider, MD  Multiple Vitamin (MULTIVITAMIN WITH MINERALS) TABS tablet Take 1 tablet by mouth daily.   Yes Historical Provider, MD  QUEtiapine (SEROQUEL) 400 MG tablet Take 800 mg by mouth at bedtime. 12/02/15  Yes Historical Provider, MD  TANZEUM 50 MG PEN Inject 50 mg into the skin every 7 (seven) days. 11/17/15  Yes Historical Provider, MD  temazepam (RESTORIL) 15 MG capsule Take 15 mg by mouth at bedtime as needed for sleep.   Yes Historical Provider, MD   BP 139/79 mmHg  Pulse 91  Temp(Src) 100 F (37.8 C) (Oral)  Resp 18  Ht 6'  (1.829 m)  Wt 300 lb (136.079 kg)  BMI 40.68 kg/m2  SpO2 94% Physical Exam  Constitutional: He appears well-developed and well-nourished. No distress.  HENT:  Head: Normocephalic and atraumatic.  Mouth/Throat: Oropharynx is clear and moist. No oropharyngeal exudate.  Eyes: Conjunctivae and EOM are normal. Pupils are equal, round, and reactive to light. Right eye exhibits no discharge. Left eye exhibits no discharge. No scleral icterus.  Neck: Normal range of motion. Neck supple. No JVD present. No thyromegaly present.  Cardiovascular: Normal rate, regular rhythm, normal heart sounds and intact distal pulses.  Exam reveals no gallop and no friction rub.   No murmur heard. Pulmonary/Chest: Effort normal. No respiratory distress. He has wheezes. He has no rales.  Slight expiratory wheeze at all lung fields  Abdominal: Soft. Bowel sounds are normal. He exhibits no distension and no mass. There is no tenderness.  Musculoskeletal: Normal range of motion. He exhibits no edema or tenderness.  RLE is assymetrical to the left, slightly enlarged with slightly more pitting edema  Lymphadenopathy:    He has no cervical adenopathy.  Neurological: He is alert. Coordination normal.  Skin: Skin is warm and dry. No rash noted. No erythema.  Psychiatric: He has a normal mood and affect. His behavior is normal.  Nursing note and vitals reviewed.   ED Course  Procedures  DIAGNOSTIC STUDIES:  Oxygen Saturation is 92% on RA, low by my interpretation.    COORDINATION OF CARE:  12:49 AM Discussed treatment [name], which includes X-ray with pt at bedside and pt agreed to [name].  Labs Review Labs Reviewed  D-DIMER, QUANTITATIVE (NOT AT [facility]) - Abnormal; Notable for the following:    D-Dimer, Quant 10.28 (*)    All other components within normal limits  CBC WITH DIFFERENTIAL/PLATELET - Abnormal; Notable for the following:    Hemoglobin 12.7 (*)    HCT 38.3 (*)    All other components within normal  limits  COMPREHENSIVE METABOLIC PANEL - Abnormal; Notable for the following:    Glucose, Bld 109 (*)    BUN 26 (*)    Creatinine, Ser 1.56 (*)    AST 79 (*)    GFR calc non Af Amer 50 (*)    GFR calc Af Amer 58 (*)    All other components within normal limits  PROTIME-INR  TROPONIN I  APTT    Imaging Review Dg Chest 2 View  12/04/2015  CLINICAL DATA:  Hemoptysis. EXAM: CHEST  2 VIEW COMPARISON:  03/15/2008 FINDINGS: The cardiomediastinal contours are unchanged, heart at the upper limits of normal in size. The lungs are clear. Pulmonary vasculature is normal. No consolidation, pleural effusion, or pneumothorax. No acute osseous abnormalities are seen. IMPRESSION: No acute pulmonary process. Electronically Signed  By: Jeb Levering M.D.   On: 12/04/2015 01:42   Ct Angio Chest Pe W Or Wo Contrast  12/04/2015  CLINICAL DATA:  Acute onset of hemoptysis after spraying chalk powder for bugs. Elevated D-dimer. Initial encounter. EXAM: CT ANGIOGRAPHY CHEST WITH CONTRAST TECHNIQUE: Multidetector CT imaging of the chest was performed using the standard protocol during bolus administration of intravenous contrast. Multiplanar CT image reconstructions and MIPs were obtained to evaluate the vascular anatomy. CONTRAST:  100 mL of Isovue 370 IV contrast COMPARISON:  Chest radiograph performed earlier today at 12:54 a.m. FINDINGS: Scattered pulmonary emboli are noted within the left main pulmonary artery, and within segmental pulmonary arteries on the right. These extend distally into subsegmental branches bilaterally, within all lobes of both lungs. The RV/LV ratio of 1.1 corresponds to right heart strain and at least submassive pulmonary embolus. Mild bibasilar atelectasis is noted. The lungs are otherwise clear. There is no evidence of significant focal consolidation, pleural effusion or pneumothorax. No masses are identified; no abnormal focal contrast enhancement is seen. The mediastinum is otherwise  grossly unremarkable. No mediastinal lymphadenopathy is seen. No pericardial effusion is identified. The great vessels are grossly unremarkable in appearance. No axillary lymphadenopathy is seen. The visualized portions of the thyroid gland are unremarkable in appearance. The visualized portions of the liver and spleen are unremarkable. No acute osseous abnormalities are seen. Review of the MIP images confirms the above findings. IMPRESSION: 1. Scattered pulmonary emboli within the left main pulmonary artery, and within segmental pulmonary arteries on the right, extending distally into subsegmental branches in all lobes of both lungs. CT evidence of right heart strain (RV/LV Ratio = 1.1) consistent with at least submassive (intermediate risk) PE. The presence of right heart strain has been associated with an increased risk of morbidity and mortality. Please activate Code PE by paging [phone]. 2. Mild bibasilar atelectasis noted.  Lungs otherwise clear. Critical Value/emergent results were called by telephone at the time of interpretation on 12/04/2015 at 3:57 am to Dr. [name], who verbally acknowledged these results. Electronically Signed   By: Garald Balding M.D.   On: 12/04/2015 03:59   I have personally reviewed and evaluated these images and lab results as part of my medical decision-making.   EKG Interpretation   Date/Time:  Monday December 04 2015 04:17:39 EDT Ventricular Rate:  91 PR Interval:    QRS Duration: 101 QT Interval:  358 QTC Calculation: 441 R Axis:   67 Text Interpretation:  Sinus rhythm Consider left atrial enlargement since  last tracing no significant change Confirmed by Lailie Smead  MD, H[place] Pellicane ([id number])  on 12/04/2015 4:32:53 AM      MDM   Final diagnoses:  Other acute pulmonary embolism without acute cor pulmonale (HCC)    The patient has had more hemoptysis since arrival. His vital signs however have stayed in a normal range. Because of the concern for his unilateral leg  swelling and the hemoptysis a d-dimer was obtained which was significantly elevated prompting a CT angiogram of the chest.  I have personally seen and interpreted the CT angiogram of the chest and there does appear to be bilateral pulmonary emboli. According to the radiologist there is signs of right heart strain. Code PE was activated  D/w Dr. Oletta Darter - will see in consult if deteriorates or if Echo is poor.  Hospitalist paged Heparin drip started  CRITICAL CARE Performed by: Johnna Acosta Total critical care time: 35 minutes Critical care time was exclusive of separately  billable procedures and treating other patients. Critical care was necessary to treat or prevent imminent or life-threatening deterioration. Critical care was time spent personally by me on the following activities: development of treatment [name] with patient and/or surrogate as well as nursing, discussions with consultants, evaluation of patient's response to treatment, examination of patient, obtaining history from patient or surrogate, ordering and performing treatments and interventions, ordering and review of laboratory studies, ordering and review of radiographic studies, pulse oximetry and re-evaluation of patient's condition.    [name], MD 12/04/15 [phone]

## 2015-12-04 NOTE — Progress Notes (Signed)
ANTICOAGULATION CONSULT NOTE - Initial Consult  Pharmacy Consult for Heparin Indication: pulmonary embolus  Allergies  Allergen Reactions  . Aspirin Other (See Comments)    Patient tolerates LOW DOSE ASPIRIN.    Patient Measurements: Height: 6' (182.9 cm) Weight: 300 lb (136.079 kg) IBW/kg (Calculated) : 77.6 Heparin Dosing Weight: 109 kg  Vital Signs: Temp: 100 F (37.8 C) (07/03 0012) Temp Source: Oral (07/03 0012) BP: 139/79 mmHg (07/03 0257) Pulse Rate: 91 (07/03 0257)  Labs:  Recent Labs  12/04/15 0129  HGB 12.7*  HCT 38.3*  PLT 222  LABPROT 15.2  INR 1.24  CREATININE 1.56*    Estimated Creatinine Clearance: 80 mL/min (by C-G formula based on Cr of 1.56).   Medical History: Past Medical History  Diagnosis Date  . Bipolar 1 disorder ([place])   . Diabetes mellitus without complication ([employer])   . Hypertension   . Hypercholesteremia     Medications:  Scheduled:  . heparin  4,000 Units Intravenous Once   Infusions:  . heparin    . sodium chloride      Assessment:  50 yr male presents with complaint of hemoptysis.  Pt with right lower leg pitting edema.  Reports an old injury and was to see orthopedist soon.  Elevated D-Dimer  CTAngio = + bilateral pulmonary embolism with right heart strain  Pharmacy consulted to dose IV heparin  Patient on no oral anticoagulation PTA (baseline INR = 1.24)  Goal of Therapy:  Heparin level 0.3-0.7 units/ml Monitor platelets by anticoagulation protocol: Yes   Plan:   Add baseline aPTT onto PT/INR drawn earlier  Heparin 4000 unit IV bolus x 1 followed by heparin infusion @ 1850 units/hr  Check heparin level 6 hr after heparin started  Check heparin level & CBC daily  Edita Weyenberg, Toribio Harbour, PharmD 12/04/2015,4:25 AM

## 2015-12-04 NOTE — Progress Notes (Signed)
[facility]  50 y/o male with right knee pain on ambulation for 2 months now, right leg swelling which he just noticed, presents for cough ing up blood- about 2 small cups. No other symptoms. Found to have multiple PEs and possible right heart strain on CT.   PMH: HTN, DM, HLD, Bipolar, CKD 3  Subjective: had 2 episodes of coughing up blood - has resolved today, no chest pain, dyspnea. No current left knee pain.   Exam: CTA b/l, right leg edema, erythema, knees not tender or swollen.  Principal Problem:   Pulmonary embolism  - Heparin - likely will start NOAC tomorrow - EKG unrevealing  - Troponin pending ECHO - Venous duplex today - tachycardic on admission- BP stable  Active Problems:   Right leg swelling - likely DVT    Right knee pain - has an ortho appt later this week-  xrays today>> small effusion seen with degenerative changes- f/u with Ortho next week    Bipolar 1 disorder  -cont home meds    Diabetes mellitus without complication  - cont sliding scale- takes Lantus 80 U daily at home- on 50 here    Hypercholesteremia - Lipitor    CKD (chronic kidney disease), stage III - stable  HTN - Enalapril   Debbe Odea, MD Pager: [phone]

## 2015-12-04 NOTE — Progress Notes (Addendum)
VASCULAR LAB PRELIMINARY  PRELIMINARY  PRELIMINARY  PRELIMINARY  Bilateral lower extremity venous duplex completed.    Preliminary report:  Right:  DVT noted in the posterior tibial, popliteal, and femoral veins.  No evidence of superficial thrombosis.  No Baker's cyst. Left:  No evidence of DVT, superficial thrombosis, or Baker's cyst.  Kendarius Vigen, RVS 12/04/2015, 4:15 PM

## 2015-12-05 LAB — LIPID PANEL
CHOL/HDL RATIO: 3.4 ratio
Cholesterol: 126 mg/dL (ref 0–200)
HDL: 37 mg/dL — AB (ref >40)
LDL CALC: 56 mg/dL (ref 0–99)
TRIGLYCERIDES: 163 mg/dL — AB (ref <150)
VLDL: 33 mg/dL (ref 0–40)

## 2015-12-05 LAB — CBC
HEMATOCRIT: 37.2 % — AB (ref 39.0–52.0)
HEMOGLOBIN: 12.3 g/dL — AB (ref 13.0–17.0)
MCH: 28.8 pg (ref 26.0–34.0)
MCHC: 33.1 g/dL (ref 30.0–36.0)
MCV: 87.1 fL (ref 78.0–100.0)
Platelets: 220 10*3/uL (ref 150–400)
RBC: 4.27 MIL/uL (ref 4.22–5.81)
RDW: 12.7 % (ref 11.5–15.5)
WBC: 3.5 10*3/uL — ABNORMAL LOW (ref 4.0–10.5)

## 2015-12-05 LAB — GLUCOSE, CAPILLARY
GLUCOSE-CAPILLARY: 108 mg/dL — AB (ref 65–99)
GLUCOSE-CAPILLARY: 136 mg/dL — AB (ref 65–99)
GLUCOSE-CAPILLARY: 108 mg/dL — AB (ref 65–99)
Glucose-Capillary: 143 mg/dL — ABNORMAL HIGH (ref 65–99)

## 2015-12-05 LAB — TROPONIN I
TROPONIN I: 0.04 ng/mL — AB (ref <0.03)
Troponin I: 0.03 ng/mL (ref <0.03)
Troponin I: <0.03 ng/mL (ref <0.03)
Troponin I: 0.03 ng/mL (ref <0.03)

## 2015-12-05 LAB — BASIC METABOLIC PANEL
Anion gap: 8 (ref 5–15)
BUN: 17 mg/dL (ref 6–20)
CALCIUM: 9.2 mg/dL (ref 8.9–10.3)
CO2: 23 mmol/L (ref 22–32)
Chloride: 111 mmol/L (ref 101–111)
Creatinine, Ser: 1.1 mg/dL (ref 0.61–1.24)
GLUCOSE: 122 mg/dL — AB (ref 65–99)
Potassium: 3.8 mmol/L (ref 3.5–5.1)
SODIUM: 142 mmol/L (ref 135–145)

## 2015-12-05 LAB — HEMOGLOBIN A1C
Hgb A1c MFr Bld: 8.3 % — ABNORMAL HIGH (ref 4.8–5.6)
Mean Plasma Glucose: 192 mg/dL

## 2015-12-05 LAB — HEPARIN LEVEL (UNFRACTIONATED): HEPARIN UNFRACTIONATED: 0.31 [IU]/mL (ref 0.30–0.70)

## 2015-12-05 MED ORDER — WARFARIN VIDEO: Freq: Once | Status: DC

## 2015-12-05 MED ORDER — COUMADIN BOOK
Freq: Once | Status: AC
  Administered 2015-12-05: 14:00:00
  Filled 2015-12-05: qty 1

## 2015-12-05 MED ORDER — WARFARIN SODIUM 5 MG PO TABS
10.0000 mg | ORAL_TABLET | Freq: Once | ORAL | Status: AC
  Administered 2015-12-05: 10 mg via ORAL
  Filled 2015-12-05: qty 2

## 2015-12-05 MED ORDER — RIVAROXABAN 15 MG PO TABS: 15.0000 mg | ORAL_TABLET | Freq: Two times a day (BID) | ORAL | Status: DC

## 2015-12-05 MED ORDER — WARFARIN - PHARMACIST DOSING INPATIENT: Freq: Every day | Status: DC

## 2015-12-05 MED ORDER — ENOXAPARIN SODIUM 150 MG/ML ~~LOC~~ SOLN
140.0000 mg | Freq: Two times a day (BID) | SUBCUTANEOUS | Status: DC
  Administered 2015-12-06 – 2015-12-07 (×3): 140 mg via SUBCUTANEOUS
  Filled 2015-12-05 (×4): qty 0.93

## 2015-12-05 MED ORDER — ENOXAPARIN (LOVENOX) PATIENT EDUCATION KIT
PACK | Freq: Once | Status: AC
  Administered 2015-12-05: 16:00:00
  Filled 2015-12-05: qty 1

## 2015-12-05 MED ORDER — ENOXAPARIN SODIUM 150 MG/ML ~~LOC~~ SOLN
140.0000 mg | Freq: Once | SUBCUTANEOUS | Status: AC
  Administered 2015-12-05: 140 mg via SUBCUTANEOUS
  Filled 2015-12-05: qty 0.93

## 2015-12-05 MED ORDER — CARBAMAZEPINE 200 MG PO TABS
400.0000 mg | ORAL_TABLET | Freq: Every day | ORAL | Status: DC
  Administered 2015-12-06 – 2015-12-07 (×2): 400 mg via ORAL
  Filled 2015-12-05 (×2): qty 2

## 2015-12-05 MED ORDER — CARBAMAZEPINE 200 MG PO TABS
600.0000 mg | ORAL_TABLET | Freq: Every day | ORAL | Status: DC
  Administered 2015-12-05 – 2015-12-06 (×2): 600 mg via ORAL
  Filled 2015-12-05 (×2): qty 3

## 2015-12-05 NOTE — Progress Notes (Signed)
12/05/15  1934  Started patient education video #109 patient and wife are watching.

## 2015-12-05 NOTE — Progress Notes (Signed)
12/08/15  1924  Started education video #108 patient and wife watching video.

## 2015-12-05 NOTE — Progress Notes (Addendum)
PROGRESS NOTE    Cameron [name]  [id number] DOB: Dec 17, 1965 DOA: 12/03/2015  PCP: [name], MD   Brief Narrative:  50 y/o male with right knee pain on ambulation for 2 months now, right leg swelling which he just noticed, presents for cough ing up blood- about 2 small cups. No other symptoms. Found to have multiple PEs and possible right heart strain on CT.   Subjective: No pain, dyspnea or cough.   Assessment & Plan:   Principal Problem:  Pulmonary embolism - right leg DVT - Heparin- Unfortunately, his Carbamazepine interacts with Xarelto and Eliquis and decreases their blood levels- I and the pharmacist haave discussed options with him in detail -  patient has opted to start Lovenox and Coumadin rather than stopping his Carbamazepine to start a NOAC - will start Lovenox teaching and Coumadin per pharmacy today -  - EKG unrevealing  - Troponin mildy elevated- ECHO shows gr 1 diastolic dysfunction  - Venous duplex shows extensive clot in right leg - tachycardic on admission- BP stable  Active Problems:     Right knee pain - has an ortho appt later this week-no swelling or tenderness on exam- pain only when walking -  xrays  >> small effusion seen with degenerative changes- f/u with Ortho    Bipolar 1 disorder  -cont home meds- he has been quite stable on his regimen for a number of years   Diabetes mellitus without complication  - cont sliding scale- takes Lantus 80 U daily at home- on 50 here   Hypercholesteremia - Lipitor   CKD (chronic kidney disease), stage III - stable  HTN - Enalapril  DVT prophylaxis: Heparin>> Lovenox Code Status: full code Family Communication:  Disposition Plan: home tomorrow Consultants:   none Procedures:  Normal LV systolic function; grade 1 diastolic dysfunction; mild  LVH; mild LAE.  Antimicrobials:  Anti-infectives    None       Objective: Filed Vitals:   12/05/15 0900 12/05/15 1000 12/05/15  1057 12/05/15 1100  BP:   127/73 127/73  Pulse: 101 95 106 101  Temp:      TempSrc:      Resp:   25 23  Height:      Weight:      SpO2: 95% 95% 99% 99%    Intake/Output Summary (Last 24 hours) at 12/05/15 1424 Last data filed at 12/05/15 1000  Gross per 24 hour  Intake 2720.55 ml  Output      0 ml  Net 2720.55 ml   Filed Weights   12/04/15 0012 12/04/15 0552 12/05/15 0400  Weight: 136.079 kg (300 lb) 142.7 kg (314 lb 9.5 oz) 142.5 kg (314 lb 2.5 oz)    Examination: General exam: Appears comfortable  HEENT: PERRLA, oral mucosa moist, no sclera icterus or thrush Respiratory system: Clear to auscultation. Respiratory effort normal. Cardiovascular system: S1 & S2 heard, RRR.  No murmurs  Gastrointestinal system: Abdomen soft, non-tender, nondistended. Normal bowel sound. No organomegaly Central nervous system: Alert and oriented. No focal neurological deficits. Extremities: No cyanosis, clubbing - 2 +edema of right leg Skin: No rashes or ulcers Psychiatry:  Mood & affect appropriate.     Data Reviewed: I have personally reviewed following labs and imaging studies  CBC:  Recent Labs Lab 12/04/15 0129 12/04/15 0526 12/05/15 0351  WBC 4.9 5.0 3.5*  NEUTROABS 3.1  --   --   HGB 12.7* 12.4* 12.3*  HCT 38.3* 37.3* 37.2*  MCV 86.3 86.7 87.1  PLT 222 229 [id number]   Basic Metabolic Panel:  Recent Labs Lab 12/04/15 0129 12/05/15 0351  NA 141 142  K 3.7 3.8  CL 109 111  CO2 22 23  GLUCOSE 109* 122*  BUN 26* 17  CREATININE 1.56* 1.10  CALCIUM 9.5 9.2   GFR: Estimated Creatinine Clearance: 116.4 mL/min (by C-G formula based on Cr of 1.1). Liver Function Tests:  Recent Labs Lab 12/04/15 0129  AST 79*  ALT 49  ALKPHOS 40  BILITOT 1.0  PROT 7.4  ALBUMIN 4.2   No results for input(s): LIPASE, AMYLASE in the last 168 hours. No results for input(s): AMMONIA in the last 168 hours. Coagulation Profile:  Recent Labs Lab 12/04/15 0129  INR 1.24   Cardiac  Enzymes:  Recent Labs Lab 12/04/15 1009 12/04/15 1703 12/04/15 2227 12/05/15 0351 12/05/15 1050  TROPONINI 0.04* 0.04* 0.03* 0.04* 0.03*   BNP (last 3 results) No results for input(s): PROBNP in the last 8760 hours. HbA1C:  Recent Labs  12/04/15 0526  HGBA1C 8.3*   CBG:  Recent Labs Lab 12/04/15 1124 12/04/15 1642 12/04/15 2151 12/05/15 0742 12/05/15 1159  GLUCAP 134* 142* 118* 108* 136*   Lipid Profile:  Recent Labs  12/05/15 0351  CHOL 126  HDL 37*  LDLCALC 56  TRIG 163*  CHOLHDL 3.4   Thyroid Function Tests: No results for input(s): TSH, T4TOTAL, FREET4, T3FREE, THYROIDAB in the last 72 hours. Anemia Panel: No results for input(s): VITAMINB12, FOLATE, FERRITIN, TIBC, IRON, RETICCTPCT in the last 72 hours. Urine analysis:    Component Value Date/Time   COLORURINE YELLOW 08/24/2010 2029   APPEARANCEUR CLEAR 08/24/2010 2029   LABSPEC 1.025 08/24/2010 2029   PHURINE 5.0 08/24/2010 2029   GLUCOSEU >1000* 08/24/2010 2029   HGBUR NEGATIVE 08/24/2010 2029   BILIRUBINUR NEGATIVE 08/24/2010 2029   KETONESUR >80* 08/24/2010 2029   PROTEINUR NEGATIVE 08/24/2010 2029   UROBILINOGEN 0.2 08/24/2010 2029   NITRITE NEGATIVE 08/24/2010 2029   LEUKOCYTESUR NEGATIVE 08/24/2010 2029   Sepsis Labs: @LABRCNTIP (procalcitonin:4,lacticidven:4) ) Recent Results (from the past 240 hour(s))  MRSA PCR Screening     Status: None   Collection Time: 12/04/15  5:40 AM  Result Value Ref Range Status   MRSA by PCR NEGATIVE NEGATIVE Final    Comment:        The GeneXpert MRSA Assay (FDA approved for NASAL specimens only), is one component of a comprehensive MRSA colonization surveillance program. It is not intended to diagnose MRSA infection nor to guide or monitor treatment for MRSA infections.          Radiology Studies: Dg Chest 2 View  12/04/2015  CLINICAL DATA:  Hemoptysis. EXAM: CHEST  2 VIEW COMPARISON:  03/15/2008 FINDINGS: The cardiomediastinal contours  are unchanged, heart at the upper limits of normal in size. The lungs are clear. Pulmonary vasculature is normal. No consolidation, pleural effusion, or pneumothorax. No acute osseous abnormalities are seen. IMPRESSION: No acute pulmonary process. Electronically Signed   By: Jeb Levering M.D.   On: 12/04/2015 01:42   Ct Angio Chest Pe W Or Wo Contrast  12/04/2015  CLINICAL DATA:  Acute onset of hemoptysis after spraying chalk powder for bugs. Elevated D-dimer. Initial encounter. EXAM: CT ANGIOGRAPHY CHEST WITH CONTRAST TECHNIQUE: Multidetector CT imaging of the chest was performed using the standard protocol during bolus administration of intravenous contrast. Multiplanar CT image reconstructions and MIPs were obtained to evaluate the vascular anatomy. CONTRAST:  100 mL of Isovue 370 IV contrast COMPARISON:  Chest radiograph performed earlier today at 12:54 a.m. FINDINGS: Scattered pulmonary emboli are noted within the left main pulmonary artery, and within segmental pulmonary arteries on the right. These extend distally into subsegmental branches bilaterally, within all lobes of both lungs. The RV/LV ratio of 1.1 corresponds to right heart strain and at least submassive pulmonary embolus. Mild bibasilar atelectasis is noted. The lungs are otherwise clear. There is no evidence of significant focal consolidation, pleural effusion or pneumothorax. No masses are identified; no abnormal focal contrast enhancement is seen. The mediastinum is otherwise grossly unremarkable. No mediastinal lymphadenopathy is seen. No pericardial effusion is identified. The great vessels are grossly unremarkable in appearance. No axillary lymphadenopathy is seen. The visualized portions of the thyroid gland are unremarkable in appearance. The visualized portions of the liver and spleen are unremarkable. No acute osseous abnormalities are seen. Review of the MIP images confirms the above findings. IMPRESSION: 1. Scattered pulmonary  emboli within the left main pulmonary artery, and within segmental pulmonary arteries on the right, extending distally into subsegmental branches in all lobes of both lungs. CT evidence of right heart strain (RV/LV Ratio = 1.1) consistent with at least submassive (intermediate risk) PE. The presence of right heart strain has been associated with an increased risk of morbidity and mortality. Please activate Code PE by paging [phone]. 2. Mild bibasilar atelectasis noted.  Lungs otherwise clear. Critical Value/emergent results were called by telephone at the time of interpretation on 12/04/2015 at 3:57 am to Dr. [name], who verbally acknowledged these results. Electronically Signed   By: Garald Balding M.D.   On: 12/04/2015 03:59   Dg Knee Complete 4 Views Right  12/04/2015  CLINICAL DATA:  Pain and swelling.  No recent injury. EXAM: RIGHT KNEE - COMPLETE 4+ VIEW COMPARISON:  None. FINDINGS: Degenerative changes right knee. No evidence of fracture or dislocation. Small knee joint effusion cannot be excluded. IMPRESSION: 1. No acute bony or joint abnormality. Tricompartment degenerative change. No acute bony abnormality identified . 2. Small knee joint effusion . Electronically Signed   By: Marcello Moores  Register   On: 12/04/2015 09:22      Scheduled Meds: . ARIPiprazole  30 mg Oral QPM  . atorvastatin  40 mg Oral Daily  . [START ON 12/06/2015] carbamazepine  400 mg Oral Daily  . carbamazepine  600 mg Oral QHS  . enalapril  5 mg Oral BID  . enoxaparin (LOVENOX) injection  140 mg Subcutaneous Once  . [START ON 12/06/2015] enoxaparin (LOVENOX) injection  140 mg Subcutaneous Q12H  . fenofibrate  160 mg Oral Daily  . insulin aspart  0-15 Units Subcutaneous TID WC  . insulin glargine  50 Units Subcutaneous Daily  . lamoTRIgine  400 mg Oral QHS  . multivitamin with minerals  1 tablet Oral Daily  . QUEtiapine  800 mg Oral QHS  . sodium chloride flush  3 mL Intravenous Q12H  . warfarin  10 mg Oral Once  .  warfarin   Does not apply Once  . Warfarin - Pharmacist Dosing Inpatient   Does not apply q1800   Continuous Infusions: . heparin 1,950 Units/hr (12/05/15 1000)     LOS: 1 day    Time spent in minutes: 20    Evaro, MD [facility] Pager: [email or web address] Password [email or web address] 12/05/2015, 2:24 PM

## 2015-12-05 NOTE — Progress Notes (Signed)
ANTICOAGULATION CONSULT NOTE - Follow Up Consult  Pharmacy Consult for Heparin Indication: pulmonary embolus  No Active Allergies  Patient Measurements: Height: 6' (182.9 cm) Weight: (!) 314 lb 2.5 oz (142.5 kg) IBW/kg (Calculated) : 77.6 Heparin Dosing Weight: 109 kg  Vital Signs: Temp: 98.3 F (36.8 C) (07/04 0400) Temp Source: Oral (07/04 0400) BP: 147/77 mmHg (07/04 0429) Pulse Rate: 97 (07/04 0700)  Labs:  Recent Labs  12/04/15 0129 12/04/15 0526  12/04/15 1009 12/04/15 1703 12/04/15 2227 12/05/15 0351  HGB 12.7* 12.4*  --   --   --   --  12.3*  HCT 38.3* 37.3*  --   --   --   --  37.2*  PLT 222 229  --   --   --   --  220  APTT  --  35  --   --   --   --   --   LABPROT 15.2  --   --   --   --   --   --   INR 1.24  --   --   --   --   --   --   HEPARINUNFRC  --   --   --  0.35 0.30  --  0.31  CREATININE 1.56*  --   --   --   --   --  1.10  TROPONINI  --   --   < > 0.04* 0.04* 0.03* 0.04*  < > = values in this interval not displayed.  Estimated Creatinine Clearance: 116.4 mL/min (by C-G formula based on Cr of 1.1).   Medical History: Past Medical History  Diagnosis Date  . Bipolar 1 disorder ([place])   . Diabetes mellitus without complication ([place])   . Hypertension   . Hypercholesteremia   . CKD (chronic kidney disease), stage III     Assessment: 50 yr male presents with complaint of hemoptysis.  Pt also had right lower leg pitting edema.  Reports an old injury and was to see orthopedist soon.  Had elevated D-dimer.  CTAngio = + bilateral pulmonary embolism with right heart strain. Pharmacy consulted to dose IV heparin. Patient on no oral anticoagulation PTA (baseline INR = 1.24, aPTT 35 sec).  Today, 12/05/2015: - Heparin level 0.31, therapeutic but at low end of range with infusion at 1900 units/hr - Hgb stable, platelets WNL - No further episodes of hemoptysis since presentation. RN reports some blood around IV infusion site that she will continue to  watch. - Hypercoag panel in process  Goal of Therapy:  Heparin level 0.3-0.7 units/ml Monitor platelets by anticoagulation protocol: Yes   Plan:  Increase heparin infusion to 1950 units/hr. Will check a HL this afternoon. Daily HL and CBC while on heparin infusion.  Hershal Coria, PharmD, BCPS Pager: [phone] 12/05/2015 8:19 AM

## 2015-12-05 NOTE — Progress Notes (Signed)
ANTICOAGULATION CONSULT NOTE - Follow Up Consult  Pharmacy Consult for Heparin --> Lovenox/warfarin Indication: pulmonary embolus  No Active Allergies  Patient Measurements: Height: 6' (182.9 cm) Weight: (!) 314 lb 2.5 oz (142.5 kg) IBW/kg (Calculated) : 77.6 Heparin Dosing Weight: 109 kg  Vital Signs: Temp: 97.5 F (36.4 C) (07/04 0800) Temp Source: Oral (07/04 0800) BP: 127/73 mmHg (07/04 1100) Pulse Rate: 101 (07/04 1100)  Labs:  Recent Labs  12/04/15 0129 12/04/15 0526  12/04/15 1009 12/04/15 1703 12/04/15 2227 12/05/15 0351 12/05/15 1050  HGB 12.7* 12.4*  --   --   --   --  12.3*  --   HCT 38.3* 37.3*  --   --   --   --  37.2*  --   PLT 222 229  --   --   --   --  220  --   APTT  --  35  --   --   --   --   --   --   LABPROT 15.2  --   --   --   --   --   --   --   INR 1.24  --   --   --   --   --   --   --   HEPARINUNFRC  --   --   --  0.35 0.30  --  0.31  --   CREATININE 1.56*  --   --   --   --   --  1.10  --   TROPONINI  --   --   < > 0.04* 0.04* 0.03* 0.04* 0.03*  < > = values in this interval not displayed.  Estimated Creatinine Clearance: 116.4 mL/min (by C-G formula based on Cr of 1.1).   Medical History: Past Medical History  Diagnosis Date  . Bipolar 1 disorder ([place])   . Diabetes mellitus without complication ([place])   . Hypertension   . Hypercholesteremia   . CKD (chronic kidney disease), stage III     Assessment: 50 yr male presents with complaint of hemoptysis.  Pt also had right lower leg pitting edema.  Reports an old injury and was to see orthopedist soon.  Had elevated D-dimer.  CTAngio = + bilateral pulmonary embolism with right heart strain. Pharmacy consulted to dose IV heparin. Patient on no oral anticoagulation PTA (baseline INR = 1.24, aPTT 35 sec).  Today, 12/05/2015: - Patient ready for transition to oral anticoagulation. Options discussed with patient and wife and due to the drug interactions with Tegretol, felt that best option  was to transition to Lovenox/warfarin.  Patient will follow up with psychiatrist as well. They had previously discussed transitioning off of some of his medications and if able to transition off of Tegretol, could switch warfarin to Eliquis in the future.  In this patient on chronic Tegretol for years, would anticipate Tegretol to be eliminated from system in 3 days and for the liver enzymes to normalize in 1-2 weeks following discontinuation so would need to continue warfarin and monitor INR closely during this wash out period before transition to Eliquis. - Heparin level 0.31, therapeutic but at low end of range with infusion at 1900 units/hr - Hgb stable, platelets WNL - No further episodes of hemoptysis since presentation. RN reports some blood around IV infusion site that she will continue to watch. - Hypercoag panel in process  Goal of Therapy:  INR 2-3   Plan:  Stop heparin infusion.  Start Lovenox  1 hour after discontinuation of heparin. Start Lovenox 140 mg (1 mg/kg) SQ q12h. Continue until INR therapeutic. Warfarin 10 mg today. Daily INR and CBC q72h. Upon discharge, will need frequent INR checks and may require high doses of warfarin due to Tegretol interaction (induces metabolism of warfarin). Provided warfarin education to patient and wife.  Hershal Coria, PharmD, BCPS Pager: [phone] 12/05/2015 1:45 PM

## 2015-12-05 NOTE — Discharge Instructions (Signed)

## 2015-12-06 LAB — GLUCOSE, CAPILLARY
GLUCOSE-CAPILLARY: 134 mg/dL — AB (ref 65–99)
Glucose-Capillary: 135 mg/dL — ABNORMAL HIGH (ref 65–99)
Glucose-Capillary: 106 mg/dL — ABNORMAL HIGH (ref 65–99)
Glucose-Capillary: 113 mg/dL — ABNORMAL HIGH (ref 65–99)

## 2015-12-06 LAB — TROPONIN I: Troponin I: 0.03 ng/mL (ref <0.03)

## 2015-12-06 LAB — LUPUS ANTICOAGULANT PANEL
DRVVT: 51 s — AB (ref 0.0–47.0)
PTT Lupus Anticoagulant: 38.2 s (ref 0.0–51.9)

## 2015-12-06 LAB — HOMOCYSTEINE: HOMOCYSTEINE-NORM: 18.8 umol/L — AB (ref 0.0–15.0)

## 2015-12-06 LAB — PROTEIN C, TOTAL: PROTEIN C, TOTAL: 142 % (ref 60–150)

## 2015-12-06 LAB — BETA-2-GLYCOPROTEIN I ABS, IGG/M/A: Beta-2 Glyco I IgG: <9 GPI IgG units (ref 0–20)

## 2015-12-06 LAB — PROTEIN S, TOTAL: Protein S Ag, Total: 135 % (ref 60–150)

## 2015-12-06 LAB — DRVVT MIX: DRVVT MIX: 45.5 s (ref 0.0–47.0)

## 2015-12-06 LAB — CARDIOLIPIN ANTIBODIES, IGG, IGM, IGA
Anticardiolipin IgA: <9 APL U/mL (ref 0–11)
Anticardiolipin IgG: <9 GPL U/mL (ref 0–14)

## 2015-12-06 LAB — PROTIME-INR
INR: 1.07 (ref 0.00–1.49)
PROTHROMBIN TIME: 14.1 s (ref 11.6–15.2)

## 2015-12-06 LAB — PROTEIN S ACTIVITY: Protein S Activity: 119 % (ref 63–140)

## 2015-12-06 LAB — PROTEIN C ACTIVITY: Protein C Activity: 168 % (ref 73–180)

## 2015-12-06 MED ORDER — WARFARIN SODIUM 5 MG PO TABS
12.5000 mg | ORAL_TABLET | Freq: Once | ORAL | Status: AC
  Administered 2015-12-06: 12.5 mg via ORAL
  Filled 2015-12-06: qty 2

## 2015-12-06 NOTE — Progress Notes (Signed)
PROGRESS NOTE    Cameron [name]  [id number] DOB: 01-08-1966 DOA: 12/03/2015  PCP: [name], MD   Brief Narrative:  50 y/o male with right knee pain on ambulation for 2 months now, right leg swelling which he just noticed, presents for cough ing up blood- about 2 small cups. No other symptoms. Found to have multiple PEs and possible right heart strain on CT.   Subjective: Denies sob. Ambulating without difficulty  Assessment & Plan: Pulmonary embolism - right leg DVT - Patient's Carbamazepine interacts with Xarelto and Eliquis and decreases their blood levels -  patient has opted to start Lovenox and Coumadin rather than stopping his Carbamazepine to start a NOAC - Given Lovenox teaching and Coumadin per pharmacy - Troponin mildy elevated- ECHO shows gr 1 diastolic dysfunction  - Venous duplex shows extensive clot in right leg - tachycardic on admission- BP remains stable  Right knee pain - has an ortho appt later this week-no swelling or tenderness on exam- pain only when walking -  xrays  >> small effusion seen with degenerative changes- f/u with Ortho    Bipolar 1 disorder  -cont home meds- he has been quite stable on his regimen for a number of years -Seems stable at present   Diabetes mellitus without complication  - cont sliding scale- takes Lantus 80 U daily at home- on 50 while here   Hypercholesteremia - Lipitor   CKD (chronic kidney disease), stage III - stable  HTN - Enalapril  DVT prophylaxis: Heparin>> Lovenox Code Status: full code Family Communication:  Disposition Plan: home tomorrow Consultants:   none Procedures:  Normal LV systolic function; grade 1 diastolic dysfunction; mild  LVH; mild LAE.  Antimicrobials:  Anti-infectives    None       Objective: Filed Vitals:   12/05/15 2037 12/05/15 2328 12/06/15 0606 12/06/15 1348  BP: 112/67  114/72 138/97  Pulse: 79 97 85 80  Temp: 97.8 F (36.6 C)  98.8 F (37.1 C) 98.3  F (36.8 C)  TempSrc: Oral  Oral Oral  Resp: 20 20 20 20   Height:      Weight:      SpO2: 95% 99% 94% 96%   No intake or output data in the 24 hours ending 12/06/15 1435 Filed Weights   12/04/15 0012 12/04/15 0552 12/05/15 0400  Weight: 136.079 kg (300 lb) 142.7 kg (314 lb 9.5 oz) 142.5 kg (314 lb 2.5 oz)    Examination: General exam: Appears comfortable, sitting at side of bed HEENT: PERRLA, oral mucosa moist, no sclera icterus or thrush Respiratory system: Clear to auscultation. Respiratory effort normal. Cardiovascular system: S1 & S2 heard, RRR.  Gastrointestinal system: Abdomen soft, non-tender, nondistended. Normal bowel sound. No organomegaly Central nervous system: Alert and oriented. No focal neurological deficits. Extremities: No cyanosis, clubbing - 2 +edema of right leg Skin: No rashes or ulcers Psychiatry:  Mood & affect appropriate.     Data Reviewed: I have personally reviewed following labs and imaging studies  CBC:  Recent Labs Lab 12/04/15 0129 12/04/15 0526 12/05/15 0351  WBC 4.9 5.0 3.5*  NEUTROABS 3.1  --   --   HGB 12.7* 12.4* 12.3*  HCT 38.3* 37.3* 37.2*  MCV 86.3 86.7 87.1  PLT 222 229 [id number]   Basic Metabolic Panel:  Recent Labs Lab 12/04/15 0129 12/05/15 0351  NA 141 142  K 3.7 3.8  CL 109 111  CO2 22 23  GLUCOSE 109* 122*  BUN 26* 17  CREATININE 1.56*  1.10  CALCIUM 9.5 9.2   GFR: Estimated Creatinine Clearance: 116.4 mL/min (by C-G formula based on Cr of 1.1). Liver Function Tests:  Recent Labs Lab 12/04/15 0129  AST 79*  ALT 49  ALKPHOS 40  BILITOT 1.0  PROT 7.4  ALBUMIN 4.2   No results for input(s): LIPASE, AMYLASE in the last 168 hours. No results for input(s): AMMONIA in the last 168 hours. Coagulation Profile:  Recent Labs Lab 12/04/15 0129 12/06/15 0409  INR 1.24 1.07   Cardiac Enzymes:  Recent Labs Lab 12/05/15 0351 12/05/15 1050 12/05/15 1500 12/05/15 2217 12/06/15 0412  TROPONINI 0.04* 0.03*  <0.03 0.03* 0.03*   BNP (last 3 results) No results for input(s): PROBNP in the last 8760 hours. HbA1C:  Recent Labs  12/04/15 0526  HGBA1C 8.3*   CBG:  Recent Labs Lab 12/05/15 1159 12/05/15 1656 12/05/15 2042 12/06/15 0727 12/06/15 1223  GLUCAP 136* 108* 143* 135* 134*   Lipid Profile:  Recent Labs  12/05/15 0351  CHOL 126  HDL 37*  LDLCALC 56  TRIG 163*  CHOLHDL 3.4   Thyroid Function Tests: No results for input(s): TSH, T4TOTAL, FREET4, T3FREE, THYROIDAB in the last 72 hours. Anemia Panel: No results for input(s): VITAMINB12, FOLATE, FERRITIN, TIBC, IRON, RETICCTPCT in the last 72 hours. Urine analysis:    Component Value Date/Time   COLORURINE YELLOW 08/24/2010 2029   APPEARANCEUR CLEAR 08/24/2010 2029   LABSPEC 1.025 08/24/2010 2029   PHURINE 5.0 08/24/2010 2029   GLUCOSEU >1000* 08/24/2010 2029   HGBUR NEGATIVE 08/24/2010 2029   BILIRUBINUR NEGATIVE 08/24/2010 2029   KETONESUR >80* 08/24/2010 2029   PROTEINUR NEGATIVE 08/24/2010 2029   UROBILINOGEN 0.2 08/24/2010 2029   NITRITE NEGATIVE 08/24/2010 2029   LEUKOCYTESUR NEGATIVE 08/24/2010 2029   Sepsis Labs: @LABRCNTIP (procalcitonin:4,lacticidven:4) ) Recent Results (from the past 240 hour(s))  MRSA PCR Screening     Status: None   Collection Time: 12/04/15  5:40 AM  Result Value Ref Range Status   MRSA by PCR NEGATIVE NEGATIVE Final    Comment:        The GeneXpert MRSA Assay (FDA approved for NASAL specimens only), is one component of a comprehensive MRSA colonization surveillance program. It is not intended to diagnose MRSA infection nor to guide or monitor treatment for MRSA infections.          Radiology Studies: No results found.    Scheduled Meds: . ARIPiprazole  30 mg Oral QPM  . atorvastatin  40 mg Oral Daily  . carbamazepine  400 mg Oral Daily  . carbamazepine  600 mg Oral QHS  . enalapril  5 mg Oral BID  . enoxaparin (LOVENOX) injection  140 mg Subcutaneous Q12H    . fenofibrate  160 mg Oral Daily  . insulin aspart  0-15 Units Subcutaneous TID WC  . insulin glargine  50 Units Subcutaneous Daily  . lamoTRIgine  400 mg Oral QHS  . multivitamin with minerals  1 tablet Oral Daily  . QUEtiapine  800 mg Oral QHS  . sodium chloride flush  3 mL Intravenous Q12H  . warfarin  12.5 mg Oral ONCE-1800  . warfarin   Does not apply Once  . Warfarin - Pharmacist Dosing Inpatient   Does not apply q1800        LOS: 2 days    Laticha Ferrucci, Orpah Melter, MD [facility] Pager: [email or web address] Password [email or web address] 12/06/2015, 2:35 PM

## 2015-12-06 NOTE — Progress Notes (Addendum)
ANTICOAGULATION CONSULT NOTE - Follow Up Consult  Pharmacy Consult for Heparin --> Lovenox/warfarin Indication: pulmonary embolus/DVT  No Active Allergies  Patient Measurements: Height: 6' (182.9 cm) Weight: (!) 314 lb 2.5 oz (142.5 kg) IBW/kg (Calculated) : 77.6 Heparin Dosing Weight: 109 kg  Vital Signs: Temp: 98.8 F (37.1 C) (07/05 0606) Temp Source: Oral (07/05 0606) BP: 114/72 mmHg (07/05 0606) Pulse Rate: 85 (07/05 0606)  Labs:  Recent Labs  12/04/15 0129 12/04/15 0526  12/04/15 1009 12/04/15 1703  12/05/15 0351  12/05/15 1500 12/05/15 2217 12/06/15 0409 12/06/15 0412  HGB 12.7* 12.4*  --   --   --   --  12.3*  --   --   --   --   --   HCT 38.3* 37.3*  --   --   --   --  37.2*  --   --   --   --   --   PLT 222 229  --   --   --   --  220  --   --   --   --   --   APTT  --  35  --   --   --   --   --   --   --   --   --   --   LABPROT 15.2  --   --   --   --   --   --   --   --   --  14.1  --   INR 1.24  --   --   --   --   --   --   --   --   --  1.07  --   HEPARINUNFRC  --   --   --  0.35 0.30  --  0.31  --   --   --   --   --   CREATININE 1.56*  --   --   --   --   --  1.10  --   --   --   --   --   TROPONINI  --   --   < > 0.04* 0.04*  < > 0.04*  < > <0.03 0.03*  --  0.03*  < > = values in this interval not displayed.  Estimated Creatinine Clearance: 116.4 mL/min (by C-G formula based on Cr of 1.1).   Medical History: Past Medical History  Diagnosis Date  . Bipolar 1 disorder ([place])   . Diabetes mellitus without complication ([place])   . Hypertension   . Hypercholesteremia   . CKD (chronic kidney disease), stage III     Assessment: 50 yr male presents with complaint of hemoptysis.  Pt also had right lower leg pitting edema.  Reports an old injury and was to see orthopedist soon.  Had elevated D-dimer.  CTAngio = + bilateral pulmonary embolism with right heart strain. Pharmacy consulted to dose IV heparin. Patient on no oral anticoagulation PTA (baseline  INR = 1.24, aPTT 35 sec).  Regarding transition to oral anticoagulation. Options discussed with patient and wife and due to the drug interactions with Tegretol, felt that best option was to transition to Lovenox/warfarin.  Patient will follow up with psychiatrist as well. They had previously discussed transitioning off of some of his medications and if able to transition off of Tegretol, could switch warfarin to Eliquis in the future.  In this patient on chronic Tegretol for years, would anticipate Tegretol to  be eliminated from system in 3 days and for the liver enzymes to normalize in 1-2 weeks following discontinuation so would need to continue warfarin and monitor INR closely during this wash out period before transition to Eliquis.  Today, 12/06/2015: - INR remains subtherapeutic, actually trended down overnight. May not see full effects of last night's dose until 7/6am INR - CBC: 7/4 - Hgb stable, platelets WNL - No further episodes of hemoptysis since presentation. RN reports some blood around IV infusion site that she will continue to watch. - Hypercoag panel in process - Drug interaction with carbamazepine  Goal of Therapy:  INR 2-3   Plan:  Day #2 of minimum 5 day overlap for acute VTE  Continue Lovenox 140 mg (1 mg/kg) SQ q12h..  Warfarin 12.5 mg today.  If discharged Home today:  Enoxaparin 150mg  syringes x 10  Warfarin 5mg  tabs, instruct to take 12.5mg  (2.5 tabs) until INR follow-up (suggest Friday 7/7) then dosing per INR  Daily INR and CBC q72h.  Upon discharge, will need frequent INR checks and may require high doses of warfarin due to Tegretol interaction (induces metabolism of warfarin).  Provided warfarin education to patient and wife.  Doreene Eland, PharmD, BCPS.   Pager: [phone] 12/06/2015 8:44 AM

## 2015-12-07 DIAGNOSIS — I82411 Acute embolism and thrombosis of right femoral vein: Secondary | ICD-10-CM

## 2015-12-07 DIAGNOSIS — F319 Bipolar disorder, unspecified: Secondary | ICD-10-CM

## 2015-12-07 DIAGNOSIS — I2609 Other pulmonary embolism with acute cor pulmonale: Secondary | ICD-10-CM

## 2015-12-07 DIAGNOSIS — E119 Type 2 diabetes mellitus without complications: Secondary | ICD-10-CM

## 2015-12-07 DIAGNOSIS — M25561 Pain in right knee: Secondary | ICD-10-CM

## 2015-12-07 DIAGNOSIS — E7800 Pure hypercholesterolemia, unspecified: Secondary | ICD-10-CM

## 2015-12-07 LAB — PROTIME-INR
INR: 1.23 (ref 0.00–1.49)
PROTHROMBIN TIME: 15.7 s — AB (ref 11.6–15.2)

## 2015-12-07 LAB — GLUCOSE, CAPILLARY
GLUCOSE-CAPILLARY: 107 mg/dL — AB (ref 65–99)
Glucose-Capillary: 103 mg/dL — ABNORMAL HIGH (ref 65–99)

## 2015-12-07 MED ORDER — WARFARIN SODIUM 2.5 MG PO TABS
12.5000 mg | ORAL_TABLET | Freq: Once | ORAL | Status: AC
  Administered 2015-12-07: 12.5 mg via ORAL
  Filled 2015-12-07: qty 2

## 2015-12-07 MED ORDER — WARFARIN SODIUM 5 MG PO TABS
15.0000 mg | ORAL_TABLET | Freq: Once | ORAL | Status: DC
Start: 2015-12-07 — End: 2015-12-07

## 2015-12-07 MED ORDER — ENOXAPARIN SODIUM 150 MG/ML ~~LOC~~ SOLN: 150.0000 mg | Freq: Two times a day (BID) | SUBCUTANEOUS | Status: DC

## 2015-12-07 MED ORDER — WARFARIN SODIUM 5 MG PO TABS: 12.5000 mg | ORAL_TABLET | Freq: Once | ORAL | Status: DC

## 2015-12-07 MED ORDER — WARFARIN SODIUM 5 MG PO TABS: 10.0000 mg | ORAL_TABLET | Freq: Every day | ORAL | Status: DC

## 2015-12-07 NOTE — Progress Notes (Addendum)
Pt made aware of this information with teach back.

## 2015-12-07 NOTE — Progress Notes (Signed)
Co-pay for Lovenox 100mg  $100.00 or Enoxaparin 100mg . $35.00. Any pharmacy can be used.Pre-auth is required if the pt. exceeds 30 inj. Appointment at [facility] on Monday 7/10 at 2:30 PM on [address].

## 2015-12-07 NOTE — Discharge Summary (Signed)
Physician Discharge Summary  Cameron [name] [id number] DOB: September 30, 1965 DOA: 12/03/2015  PCP: [name], MD  Admit date: 12/03/2015 Discharge date: 12/07/2015  Admitted From: Home Disposition:  Home  Recommendations for Outpatient Follow-up:  1. Follow up with PCP in 1-2 weeks 2. Follow up with Psychiatrist as scheduled for 7/12 at 2pm 3. Follow up with routine coumadin clinic  Discharge Condition:Stable CODE STATUS: Full) Diet recommendation: Heart healthy  Brief/Interim Summary: 50 y/o male with right knee pain on ambulation for 2 months now, right leg swelling which he just noticed, presents for cough ing up blood- about 2 small cups. No other symptoms. Found to have multiple PEs and possible right heart strain on CT.   Pulmonary embolism - right leg DVT - Patient's Carbamazepine interacts with Xarelto and Eliquis and decreases their blood levels - patient had opted to start Lovenox and Coumadin rather than stopping his Carbamazepine to start a NOAC - Given Lovenox teaching and Coumadin per pharmacy - Troponin mildy elevated- ECHO shows gr 1 diastolic dysfunction  - Venous duplex shows extensive clot in right leg - tachycardic on admission- BP remains stable - Patient to continue Coumadin with Lovenox bridge. Patient scheduled for Coumadin clinic on 12/11/2015. - Please adjust Coumadin dose accordingly as outpatient with goal of INR between 2-3. When INR therapeutic, recommend stopping Lovenox at that time.  Right knee pain - has an ortho appt later this week-no swelling or tenderness on exam- pain only when walking - xrays >> small effusion seen with degenerative changes- f/u with Ortho    Bipolar 1 disorder  -cont home meds- he has been quite stable on his regimen for a number of years -Seems stable at present -Case with patient's primary psychiatrist. Patient has been scheduled a follow-up appointment on 12/13/2015 at 2 PM. Plan to continue home regimen for  now.   Diabetes mellitus without complication  - cont sliding scale- takes Lantus 80 U daily at home- on 50 while here   Hypercholesteremia - Lipitor   CKD (chronic kidney disease), stage III - stable  HTN - Continued on Enalapril  Discharge Diagnoses:  Principal Problem:   Pulmonary embolism (HCC) Active Problems:   Right leg swelling   Right knee pain   Bipolar 1 disorder (HCC)   Diabetes mellitus without complication (HCC)   Hypercholesteremia   CKD (chronic kidney disease), stage III   PE (pulmonary embolism)   Acute deep vein thrombosis (DVT) of femoral vein of right lower extremity (HCC)       Medication List    STOP taking these medications        aspirin EC 81 MG tablet      TAKE these medications        ARIPiprazole 30 MG tablet  Commonly known as:  ABILIFY  Take 30 mg by mouth every evening.     atorvastatin 40 MG tablet  Commonly known as:  LIPITOR  Take 40 mg by mouth daily.     carbamazepine 200 MG tablet  Commonly known as:  TEGRETOL  Take 400-600 mg by mouth 2 (two) times daily. Take 2 tablets in the morning Take 3 tablets in the evening     enalapril 5 MG tablet  Commonly known as:  VASOTEC  Take 5 mg by mouth 2 (two) times daily.     enoxaparin 150 MG/ML injection  Commonly known as:  LOVENOX  Inject 1 mL (150 mg total) into the skin every 12 (twelve) hours.  fenofibrate 160 MG tablet  Take 160 mg by mouth daily.     INVOKANA 100 MG Tabs tablet  Generic drug:  canagliflozin  Take 100 mg by mouth daily with breakfast.     lamoTRIgine 100 MG tablet  Commonly known as:  LAMICTAL  Take 400 mg by mouth at bedtime.     LANTUS SOLOSTAR 100 UNIT/ML Solostar Pen  Generic drug:  Insulin Glargine  Inject 60 Units into the skin daily.     multivitamin with minerals Tabs tablet  Take 1 tablet by mouth daily.     QUEtiapine 400 MG tablet  Commonly known as:  SEROQUEL  Take 800 mg by mouth at bedtime.     TANZEUM 50 MG Pen   Generic drug:  Albiglutide  Inject 50 mg into the skin every 7 (seven) days.     temazepam 15 MG capsule  Commonly known as:  RESTORIL  Take 15 mg by mouth at bedtime as needed for sleep.     warfarin 5 MG tablet  Commonly known as:  COUMADIN  Take 2 tablets (10 mg total) by mouth daily.       Follow-up Information    Follow up with Follow up with your Psychiatrist as scheduled on 7/12 at 2pm.      Follow up with [name], MD In 1 week.   Specialty:  Family Medicine   Why:  Hospital follow up   Contact information:   [address] [address] [phone]       Follow up with [facility] Office On 12/11/2015.   Specialty:  Cardiology   Why:  Look for [facility]. Time of appointment at 2:30 PM.    Contact information:   [address] [place] [address] [phone]     No Active Allergies  Procedures/Studies: Dg Chest 2 View  12/04/2015  CLINICAL DATA:  Hemoptysis. EXAM: CHEST  2 VIEW COMPARISON:  03/15/2008 FINDINGS: The cardiomediastinal contours are unchanged, heart at the upper limits of normal in size. The lungs are clear. Pulmonary vasculature is normal. No consolidation, pleural effusion, or pneumothorax. No acute osseous abnormalities are seen. IMPRESSION: No acute pulmonary process. Electronically Signed   By: Jeb Levering M.D.   On: 12/04/2015 01:42   Ct Angio Chest Pe W Or Wo Contrast  12/04/2015  CLINICAL DATA:  Acute onset of hemoptysis after spraying chalk powder for bugs. Elevated D-dimer. Initial encounter. EXAM: CT ANGIOGRAPHY CHEST WITH CONTRAST TECHNIQUE: Multidetector CT imaging of the chest was performed using the standard protocol during bolus administration of intravenous contrast. Multiplanar CT image reconstructions and MIPs were obtained to evaluate the vascular anatomy. CONTRAST:  100 mL of Isovue 370 IV contrast COMPARISON:  Chest radiograph performed earlier today  at 12:54 a.m. FINDINGS: Scattered pulmonary emboli are noted within the left main pulmonary artery, and within segmental pulmonary arteries on the right. These extend distally into subsegmental branches bilaterally, within all lobes of both lungs. The RV/LV ratio of 1.1 corresponds to right heart strain and at least submassive pulmonary embolus. Mild bibasilar atelectasis is noted. The lungs are otherwise clear. There is no evidence of significant focal consolidation, pleural effusion or pneumothorax. No masses are identified; no abnormal focal contrast enhancement is seen. The mediastinum is otherwise grossly unremarkable. No mediastinal lymphadenopathy is seen. No pericardial effusion is identified. The great vessels are grossly unremarkable in appearance. No axillary lymphadenopathy is seen. The visualized portions of the thyroid  gland are unremarkable in appearance. The visualized portions of the liver and spleen are unremarkable. No acute osseous abnormalities are seen. Review of the MIP images confirms the above findings. IMPRESSION: 1. Scattered pulmonary emboli within the left main pulmonary artery, and within segmental pulmonary arteries on the right, extending distally into subsegmental branches in all lobes of both lungs. CT evidence of right heart strain (RV/LV Ratio = 1.1) consistent with at least submassive (intermediate risk) PE. The presence of right heart strain has been associated with an increased risk of morbidity and mortality. Please activate Code PE by paging [phone]. 2. Mild bibasilar atelectasis noted.  Lungs otherwise clear. Critical Value/emergent results were called by telephone at the time of interpretation on 12/04/2015 at 3:57 am to Dr. [name], who verbally acknowledged these results. Electronically Signed   By: Garald Balding M.D.   On: 12/04/2015 03:59   Dg Knee Complete 4 Views Right  12/04/2015  CLINICAL DATA:  Pain and swelling.  No recent injury. EXAM: RIGHT KNEE -  COMPLETE 4+ VIEW COMPARISON:  None. FINDINGS: Degenerative changes right knee. No evidence of fracture or dislocation. Small knee joint effusion cannot be excluded. IMPRESSION: 1. No acute bony or joint abnormality. Tricompartment degenerative change. No acute bony abnormality identified . 2. Small knee joint effusion . Electronically Signed   By: Marcello Moores  Register   On: 12/04/2015 09:22     Subjective: No complaints today. Patient attachments breath  Discharge Exam: Filed Vitals:   12/06/15 2134 12/07/15 0519  BP: 118/85 118/78  Pulse: 75 99  Temp: 98.6 F (37 C) 97.9 F (36.6 C)  Resp: 20 20   Filed Vitals:   12/06/15 0606 12/06/15 1348 12/06/15 2134 12/07/15 0519  BP: 114/72 138/97 118/85 118/78  Pulse: 85 80 75 99  Temp: 98.8 F (37.1 C) 98.3 F (36.8 C) 98.6 F (37 C) 97.9 F (36.6 C)  TempSrc: Oral Oral Oral Oral  Resp: 20 20 20 20   Height:      Weight:      SpO2: 94% 96% 96% 96%    General: Pt is alert, awake, not in acute distress, Ambulating in room Cardiovascular: RRR, S1/S2  Respiratory: CTA bilaterally, no wheezing, no rhonchi Abdominal: Soft, NT, ND, bowel sounds + Extremities: no edema, no cyanosis    The results of significant diagnostics from this hospitalization (including imaging, microbiology, ancillary and laboratory) are listed below for reference.     Microbiology: Recent Results (from the past 240 hour(s))  MRSA PCR Screening     Status: None   Collection Time: 12/04/15  5:40 AM  Result Value Ref Range Status   MRSA by PCR NEGATIVE NEGATIVE Final    Comment:        The GeneXpert MRSA Assay (FDA approved for NASAL specimens only), is one component of a comprehensive MRSA colonization surveillance program. It is not intended to diagnose MRSA infection nor to guide or monitor treatment for MRSA infections.      Labs: BNP (last 3 results)  Recent Labs  12/04/15 0526  BNP 123456   Basic Metabolic Panel:  Recent Labs Lab  12/04/15 0129 12/05/15 0351  NA 141 142  K 3.7 3.8  CL 109 111  CO2 22 23  GLUCOSE 109* 122*  BUN 26* 17  CREATININE 1.56* 1.10  CALCIUM 9.5 9.2   Liver Function Tests:  Recent Labs Lab 12/04/15 0129  AST 79*  ALT 49  ALKPHOS 40  BILITOT 1.0  PROT 7.4  ALBUMIN  4.2   No results for input(s): LIPASE, AMYLASE in the last 168 hours. No results for input(s): AMMONIA in the last 168 hours. CBC:  Recent Labs Lab 12/04/15 0129 12/04/15 0526 12/05/15 0351  WBC 4.9 5.0 3.5*  NEUTROABS 3.1  --   --   HGB 12.7* 12.4* 12.3*  HCT 38.3* 37.3* 37.2*  MCV 86.3 86.7 87.1  PLT 222 229 220   Cardiac Enzymes:  Recent Labs Lab 12/05/15 0351 12/05/15 1050 12/05/15 1500 12/05/15 2217 12/06/15 0412  TROPONINI 0.04* 0.03* <0.03 0.03* 0.03*   BNP: Invalid input(s): POCBNP CBG:  Recent Labs Lab 12/06/15 1223 12/06/15 1654 12/06/15 2145 12/07/15 0750 12/07/15 1149  GLUCAP 134* 106* 113* 103* 107*   D-Dimer No results for input(s): DDIMER in the last 72 hours. Hgb A1c No results for input(s): HGBA1C in the last 72 hours. Lipid Profile  Recent Labs  12/05/15 0351  CHOL 126  HDL 37*  LDLCALC 56  TRIG 163*  CHOLHDL 3.4   Thyroid function studies No results for input(s): TSH, T4TOTAL, T3FREE, THYROIDAB in the last 72 hours.  Invalid input(s): FREET3 Anemia work up No results for input(s): VITAMINB12, FOLATE, FERRITIN, TIBC, IRON, RETICCTPCT in the last 72 hours. Urinalysis    Component Value Date/Time   COLORURINE YELLOW 08/24/2010 2029   APPEARANCEUR CLEAR 08/24/2010 2029   LABSPEC 1.025 08/24/2010 2029   PHURINE 5.0 08/24/2010 2029   GLUCOSEU >1000* 08/24/2010 2029   HGBUR NEGATIVE 08/24/2010 2029   BILIRUBINUR NEGATIVE 08/24/2010 2029   KETONESUR >80* 08/24/2010 2029   PROTEINUR NEGATIVE 08/24/2010 2029   UROBILINOGEN 0.2 08/24/2010 2029   NITRITE NEGATIVE 08/24/2010 2029   LEUKOCYTESUR NEGATIVE 08/24/2010 2029   Sepsis Labs Invalid input(s):  PROCALCITONIN,  WBC,  LACTICIDVEN Microbiology Recent Results (from the past 240 hour(s))  MRSA PCR Screening     Status: None   Collection Time: 12/04/15  5:40 AM  Result Value Ref Range Status   MRSA by PCR NEGATIVE NEGATIVE Final    Comment:        The GeneXpert MRSA Assay (FDA approved for NASAL specimens only), is one component of a comprehensive MRSA colonization surveillance program. It is not intended to diagnose MRSA infection nor to guide or monitor treatment for MRSA infections.      CHIU, Orpah Melter, MD  [facility] 12/07/2015, 5:20 PM  If 7PM-7AM, please contact night-coverage [email or web address] Password [email or web address]

## 2015-12-07 NOTE — Progress Notes (Addendum)
ANTICOAGULATION CONSULT NOTE - Follow Up Consult  Pharmacy Consult for Heparin --> Lovenox/warfarin Indication: pulmonary embolus/DVT  No Active Allergies  Patient Measurements: Height: 6' (182.9 cm) Weight: (!) 314 lb 2.5 oz (142.5 kg) IBW/kg (Calculated) : 77.6 Heparin Dosing Weight: 109 kg  Vital Signs: Temp: 97.9 F (36.6 C) (07/06 0519) Temp Source: Oral (07/06 0519) BP: 118/78 mmHg (07/06 0519) Pulse Rate: 99 (07/06 0519)  Labs:  Recent Labs  12/04/15 1009 12/04/15 1703  12/05/15 0351  12/05/15 1500 12/05/15 2217 12/06/15 0409 12/06/15 0412 12/07/15 0419  HGB  --   --   --  12.3*  --   --   --   --   --   --   HCT  --   --   --  37.2*  --   --   --   --   --   --   PLT  --   --   --  220  --   --   --   --   --   --   LABPROT  --   --   --   --   --   --   --  14.1  --  15.7*  INR  --   --   --   --   --   --   --  1.07  --  1.23  HEPARINUNFRC 0.35 0.30  --  0.31  --   --   --   --   --   --   CREATININE  --   --   --  1.10  --   --   --   --   --   --   TROPONINI 0.04* 0.04*  < > 0.04*  < > <0.03 0.03*  --  0.03*  --   < > = values in this interval not displayed.  Estimated Creatinine Clearance: 116.4 mL/min (by C-G formula based on Cr of 1.1).   Medical History: Past Medical History  Diagnosis Date  . Bipolar 1 disorder ([place])   . Diabetes mellitus without complication ([place])   . Hypertension   . Hypercholesteremia   . CKD (chronic kidney disease), stage III     Assessment: 50 yr male presents with complaint of hemoptysis.  Pt also had right lower leg pitting edema.  Reports an old injury and was to see orthopedist soon.  Had elevated D-dimer.  CTAngio = + bilateral pulmonary embolism with right heart strain. Pharmacy consulted to dose IV heparin. Patient on no oral anticoagulation PTA (baseline INR = 1.24, aPTT 35 sec).  Regarding transition to oral anticoagulation. Options discussed with patient and wife and due to the drug interactions with Tegretol,  felt that best option was to transition to Lovenox/warfarin.  Patient will follow up with psychiatrist as well. They had previously discussed transitioning off of some of his medications and if able to transition off of Tegretol, could switch warfarin to Eliquis in the future.  In this patient on chronic Tegretol for years, would anticipate Tegretol to be eliminated from system in 3 days and for the liver enzymes to normalize in 1-2 weeks following discontinuation so would need to continue warfarin and monitor INR closely during this wash out period before transition to Eliquis.  Today, 12/07/2015: - INR remains subtherapeutic - CBC: 7/4 - Hgb stable, platelets WNL - No further episodes of hemoptysis since presentation. RN reports some blood around IV infusion site that she will continue  to watch. - Hypercoag panel in process - Drug interaction with carbamazepine  Goal of Therapy:  INR 2-3   Plan:  Day #3 of minimum 5 day overlap for acute VTE with INR >= 2 for 2 days  Continue Lovenox 140 mg (1 mg/kg) SQ q12h..  Warfarin 12.5 mg today.  If discharged Home today:  Enoxaparin 150mg  syringes x 8  Warfarin 5mg  tabs, instruct to take 12.5mg  (2.5 tabs) today and consider INR 7/7.  IF unable to get INR tomorrow then give 12.5mg  x1 today then 10mg  on Friday, Saturday and Sunday with INR Monday.    Daily INR and CBC q72h.  Upon discharge, will need frequent INR checks and may require high doses of warfarin due to Tegretol interaction (induces metabolism of warfarin).  Provided warfarin education to patient and wife.  Doreene Eland, PharmD, BCPS.   Pager: [phone] 12/07/2015 8:32 AM

## 2015-12-07 NOTE — Progress Notes (Signed)
Education given to pt about Lovenox and administration. Pt. Was able to verbalize understanding and demonstrate giving himself the lovenox shot properly.

## 2015-12-09 LAB — FACTOR 5 LEIDEN

## 2015-12-09 LAB — PROTHROMBIN GENE MUTATION

## 2015-12-10 ENCOUNTER — Emergency Department (EMERGENCY_DEPARTMENT_HOSPITAL)
Admission: EM | Admit: 2015-12-10 | Discharge: 2015-12-11 | Disposition: A | Discharge status: Other Acute Inpatient Hospital | Payer: 59 | Source: Home / Self Care | Attending: Emergency Medicine | Admitting: Emergency Medicine

## 2015-12-10 ENCOUNTER — Encounter: Payer: Self-pay | Admitting: Emergency Medicine

## 2015-12-10 DIAGNOSIS — Z7984 Long term (current) use of oral hypoglycemic drugs: Secondary | ICD-10-CM | POA: Insufficient documentation

## 2015-12-10 DIAGNOSIS — Z794 Long term (current) use of insulin: Secondary | ICD-10-CM

## 2015-12-10 DIAGNOSIS — E7800 Pure hypercholesterolemia, unspecified: Secondary | ICD-10-CM | POA: Diagnosis present

## 2015-12-10 DIAGNOSIS — Z7901 Long term (current) use of anticoagulants: Secondary | ICD-10-CM

## 2015-12-10 DIAGNOSIS — E119 Type 2 diabetes mellitus without complications: Secondary | ICD-10-CM

## 2015-12-10 DIAGNOSIS — Z79899 Other long term (current) drug therapy: Secondary | ICD-10-CM | POA: Insufficient documentation

## 2015-12-10 DIAGNOSIS — I2699 Other pulmonary embolism without acute cor pulmonale: Secondary | ICD-10-CM | POA: Diagnosis present

## 2015-12-10 DIAGNOSIS — E1122 Type 2 diabetes mellitus with diabetic chronic kidney disease: Secondary | ICD-10-CM | POA: Insufficient documentation

## 2015-12-10 DIAGNOSIS — Z86718 Personal history of other venous thrombosis and embolism: Secondary | ICD-10-CM | POA: Insufficient documentation

## 2015-12-10 DIAGNOSIS — N183 Chronic kidney disease, stage 3 unspecified: Secondary | ICD-10-CM | POA: Diagnosis present

## 2015-12-10 DIAGNOSIS — I129 Hypertensive chronic kidney disease with stage 1 through stage 4 chronic kidney disease, or unspecified chronic kidney disease: Secondary | ICD-10-CM

## 2015-12-10 DIAGNOSIS — F319 Bipolar disorder, unspecified: Secondary | ICD-10-CM

## 2015-12-10 DIAGNOSIS — I82411 Acute embolism and thrombosis of right femoral vein: Secondary | ICD-10-CM | POA: Diagnosis present

## 2015-12-10 HISTORY — DX: Acute embolism and thrombosis of unspecified deep veins of unspecified lower extremity: I82.409

## 2015-12-10 LAB — CBC WITH DIFFERENTIAL/PLATELET
Basophils Absolute: 0 10*3/uL (ref 0–0.1)
Basophils Relative: 1 %
Eosinophils Absolute: 0 10*3/uL (ref 0–0.7)
Eosinophils Relative: 0 %
HEMATOCRIT: 36.9 % — AB (ref 40.0–52.0)
HEMOGLOBIN: 12.9 g/dL — AB (ref 13.0–18.0)
LYMPHS ABS: 1.1 10*3/uL (ref 1.0–3.6)
LYMPHS PCT: 26 %
MCH: 29.8 pg (ref 26.0–34.0)
MCHC: 34.8 g/dL (ref 32.0–36.0)
MCV: 85.5 fL (ref 80.0–100.0)
MONOS PCT: 13 %
Monocytes Absolute: 0.5 10*3/uL (ref 0.2–1.0)
NEUTROS ABS: 2.5 10*3/uL (ref 1.4–6.5)
NEUTROS PCT: 60 %
Platelets: 240 10*3/uL (ref 150–440)
RBC: 4.32 MIL/uL — AB (ref 4.40–5.90)
RDW: 12.7 % (ref 11.5–14.5)
WBC: 4.1 10*3/uL (ref 3.8–10.6)

## 2015-12-10 LAB — URINE DRUG SCREEN, QUALITATIVE (ARMC ONLY)
Amphetamines, Ur Screen: NOT DETECTED
Barbiturates, Ur Screen: NOT DETECTED
Benzodiazepine, Ur Scrn: POSITIVE — AB
CANNABINOID 50 NG, UR ~~LOC~~: NOT DETECTED
COCAINE METABOLITE, UR ~~LOC~~: NOT DETECTED
MDMA (ECSTASY) UR SCREEN: NOT DETECTED
Methadone Scn, Ur: NOT DETECTED
OPIATE, UR SCREEN: NOT DETECTED
PHENCYCLIDINE (PCP) UR S: NOT DETECTED
Tricyclic, Ur Screen: POSITIVE — AB

## 2015-12-10 LAB — URINALYSIS COMPLETE WITH MICROSCOPIC (ARMC ONLY)
BILIRUBIN URINE: NEGATIVE
Bacteria, UA: NONE SEEN
Hgb urine dipstick: NEGATIVE
KETONES UR: NEGATIVE mg/dL
Leukocytes, UA: NEGATIVE
NITRITE: NEGATIVE
Protein, ur: NEGATIVE mg/dL
RBC / HPF: NONE SEEN RBC/hpf (ref 0–5)
SPECIFIC GRAVITY, URINE: 1.012 (ref 1.005–1.030)
pH: 5 (ref 5.0–8.0)

## 2015-12-10 LAB — PROTIME-INR
INR: 1.99
Prothrombin Time: 22.5 seconds — ABNORMAL HIGH (ref 11.4–15.0)

## 2015-12-10 LAB — COMPREHENSIVE METABOLIC PANEL
ALK PHOS: 38 U/L (ref 38–126)
ALT: 72 U/L — AB (ref 17–63)
ANION GAP: 7 (ref 5–15)
AST: 70 U/L — ABNORMAL HIGH (ref 15–41)
Albumin: 4.3 g/dL (ref 3.5–5.0)
BILIRUBIN TOTAL: 0.6 mg/dL (ref 0.3–1.2)
BUN: 20 mg/dL (ref 6–20)
CALCIUM: 9 mg/dL (ref 8.9–10.3)
CO2: 25 mmol/L (ref 22–32)
CREATININE: 1.6 mg/dL — AB (ref 0.61–1.24)
Chloride: 108 mmol/L (ref 101–111)
GFR calc non Af Amer: 48 mL/min — ABNORMAL LOW (ref >60)
GFR, EST AFRICAN AMERICAN: 56 mL/min — AB (ref >60)
GLUCOSE: 151 mg/dL — AB (ref 65–99)
Potassium: 3.8 mmol/L (ref 3.5–5.1)
Sodium: 140 mmol/L (ref 135–145)
TOTAL PROTEIN: 7.5 g/dL (ref 6.5–8.1)

## 2015-12-10 LAB — ETHANOL: Alcohol, Ethyl (B): <5 mg/dL (ref <5)

## 2015-12-10 LAB — SALICYLATE LEVEL

## 2015-12-10 LAB — ACETAMINOPHEN LEVEL

## 2015-12-10 NOTE — ED Notes (Signed)
Patient reports recent hospital admission for dvt and pe.  Patient states that he has been taking all medications as prescribed.  Reports that he is very glad that he is alive and feels that his wife may have misinterpreted his happiness for mania.

## 2015-12-10 NOTE — ED Notes (Signed)
Cameron [name], TTS in with patient.

## 2015-12-10 NOTE — ED Provider Notes (Addendum)
[facility] Emergency Department Provider Note   ____________________________________________  Time seen: Approximately 11:19 PM  I have reviewed the triage vital signs and the nursing notes.   HISTORY  Chief Complaint Manic Behavior    HPI Cameron [name] is a 50 y.o. male who presents to the ED from home with his spouse with a chief complaint of mania. Patient has a history of bipolar disorderwho was recently discharged from [facility] for DVT/PE. States he is compliant with all medications including psychiatric medicines. Reports he is "glad to be alive" and is here because his wife feels he is manic and requires a mental health evaluation. Patient denies active SI/HI/AH/VH. Denies fever, chills, chest pain, shortness breath, abdominal pain, nausea, vomiting, diarrhea, bloody stools or urine. He is on Coumadin for PE and his wife states he is supposed to get his INR checked tomorrow. Nothing makes his symptoms better or worse.   Past Medical History  Diagnosis Date  . Bipolar 1 disorder ([place])   . Diabetes mellitus without complication ([place])   . Hypertension   . Hypercholesteremia   . CKD (chronic kidney disease), stage III   . DVT (deep venous thrombosis) [facility])     Patient Active Problem List   Diagnosis Date Noted  . Pulmonary embolism ([place]) 12/04/2015  . Right leg swelling 12/04/2015  . Right knee pain 12/04/2015  . PE (pulmonary embolism) 12/04/2015  . Bipolar 1 disorder ([place])   . Diabetes mellitus without complication ([place])   . Hypercholesteremia   . CKD (chronic kidney disease), stage III   . Acute deep vein thrombosis (DVT) of femoral vein of right lower extremity [facility])     Past Surgical History  Procedure Laterality Date  . Appendectomy      Current Outpatient Rx  Name  Route  Sig  Dispense  Refill  . ARIPiprazole (ABILIFY) 30 MG tablet   Oral   Take 30 mg by mouth every evening.          Marland Kitchen atorvastatin (LIPITOR)  40 MG tablet   Oral   Take 40 mg by mouth daily.      11   . Canagliflozin (INVOKANA) 100 MG TABS   Oral   Take 100 mg by mouth daily with breakfast.          . carbamazepine (TEGRETOL) 200 MG tablet   Oral   Take 400-600 mg by mouth 2 (two) times daily. Take 2 tablets in the morning Take 3 tablets in the evening         . enalapril (VASOTEC) 5 MG tablet   Oral   Take 5 mg by mouth 2 (two) times daily.         Marland Kitchen enoxaparin (LOVENOX) 150 MG/ML injection   Subcutaneous   Inject 1 mL (150 mg total) into the skin every 12 (twelve) hours.   8 Syringe   0   . fenofibrate 160 MG tablet   Oral   Take 160 mg by mouth daily.         . Insulin Glargine (LANTUS SOLOSTAR) 100 UNIT/ML Solostar Pen   Subcutaneous   Inject 60 Units into the skin daily.          Marland Kitchen lamoTRIgine (LAMICTAL) 100 MG tablet   Oral   Take 400 mg by mouth at bedtime.         . Multiple Vitamin (MULTIVITAMIN WITH MINERALS) TABS tablet   Oral   Take 1 tablet by  mouth daily.         . QUEtiapine (SEROQUEL) 400 MG tablet   Oral   Take 800 mg by mouth at bedtime.         . temazepam (RESTORIL) 15 MG capsule   Oral   Take 15 mg by mouth at bedtime as needed for sleep.         Marland Kitchen warfarin (COUMADIN) 5 MG tablet   Oral   Take 2 tablets (10 mg total) by mouth daily.   60 tablet   0   . TANZEUM 50 MG PEN   Subcutaneous   Inject 50 mg into the skin every 7 (seven) days.      2     Dispense as written.     Allergies Review of patient's allergies indicates no known allergies.  Family History  Problem Relation Age of Onset  . Stroke Other   . Diabetes Other     Social History Social History  Substance Use Topics  . Smoking status: Never Smoker   . Smokeless tobacco: None  . Alcohol Use: Yes     Comment: rarely    Review of Systems  Constitutional: No fever/chills. Eyes: No visual changes. ENT: No sore throat. Cardiovascular: Denies chest pain. Respiratory: Denies  shortness of breath. Gastrointestinal: No abdominal pain.  No nausea, no vomiting.  No diarrhea.  No constipation. Genitourinary: Negative for dysuria. Musculoskeletal: Negative for back pain. Skin: Negative for rash. Neurological: Negative for headaches, focal weakness or numbness. Psychiatric:Positive for mania.  10-point ROS otherwise negative.  ____________________________________________   PHYSICAL EXAM:  VITAL SIGNS: ED Triage Vitals  Enc Vitals Group     BP 12/10/15 2152 99/76 mmHg     Pulse Rate 12/10/15 2152 128     Resp 12/10/15 2152 18     Temp 12/10/15 2152 98.8 F (37.1 C)     Temp Source 12/10/15 2152 Oral     SpO2 12/10/15 2152 96 %     Weight --      Height --      Head Cir --      Peak Flow --      Pain Score 12/10/15 2154 0     Pain Loc --      Pain Edu? --      Excl. in [place]? --     Constitutional: Alert and oriented. Well appearing and in no acute distress. Eyes: Conjunctivae are normal. PERRL. EOMI. Head: Atraumatic. Nose: No congestion/rhinnorhea. Mouth/Throat: Mucous membranes are moist.  Oropharynx non-erythematous. Neck: No stridor.   Cardiovascular: Normal rate, regular rhythm. Grossly normal heart sounds.  Good peripheral circulation. Respiratory: Normal respiratory effort.  No retractions. Lungs CTAB. Gastrointestinal: Soft and nontender. No distention. No abdominal bruits. No CVA tenderness. Musculoskeletal: No lower extremity tenderness. RLE 2+ pitting edema.  No joint effusions. Neurologic:  Normal speech and language. No gross focal neurologic deficits are appreciated. No gait instability. Skin:  Skin is warm, dry and intact. No rash noted. Psychiatric: Mood and affect are jovial. Speech and behavior are slightly pressured.  ____________________________________________   LABS (all labs ordered are listed, but only abnormal results are displayed)  Labs Reviewed  CBC WITH DIFFERENTIAL/PLATELET - Abnormal; Notable for the following:      RBC 4.32 (*)    Hemoglobin 12.9 (*)    HCT 36.9 (*)    All other components within normal limits  COMPREHENSIVE METABOLIC PANEL  ETHANOL  ACETAMINOPHEN LEVEL  SALICYLATE LEVEL  PROTIME-INR  URINALYSIS COMPLETEWITH  MICROSCOPIC ([facility] ONLY)  URINE DRUG SCREEN, QUALITATIVE ([facility] ONLY)   ____________________________________________  EKG  None ____________________________________________  RADIOLOGY  None ____________________________________________   PROCEDURES  Procedure(s) performed: None  Procedures  Critical Care performed: No  ____________________________________________   INITIAL IMPRESSION / ASSESSMENT AND PLAN / ED COURSE  Pertinent labs & imaging results that were available during my care of the patient were reviewed by me and considered in my medical decision making (see chart for details).  50 year old male with a history of bipolar disorder who presents for behavioral medicine evaluation for mania. Currently is not suicidal or homicidal. Will check screening toxicological lab work including INR; consult TTS evaluate patient in the emergency department.  ----------------------------------------- 6:46 AM on 12/11/2015 -----------------------------------------  No events overnight. Patient remains in the [place] on a voluntary status pending psychiatry consult in the morning. ____________________________________________   FINAL CLINICAL IMPRESSION(S) / ED DIAGNOSES  Final diagnoses:  Bipolar 1 disorder ([place])      NEW MEDICATIONS STARTED DURING THIS VISIT:  New Prescriptions   No medications on file     Note:  This document was prepared using Dragon voice recognition software and may include unintentional dictation errors.    Paulette Blanch, MD 12/11/15 [id number]  Paulette Blanch, MD 12/11/15 [phone]

## 2015-12-10 NOTE — ED Notes (Addendum)
Pt with history of bipolar; wife says he's "riding high"; pt discharged Thursday from [facility] after staying 5 days with DVT and PE; pt says he's here to "make them happy" (points to wife and daughter) and "to chill"; pt would like a mental health evaluation to make his wife happy; pt very talkative in triage; calm and cooperative

## 2015-12-11 ENCOUNTER — Telehealth: Payer: Self-pay

## 2015-12-11 ENCOUNTER — Inpatient Hospital Stay
Admission: AD | Admit: 2015-12-11 | Discharge: 2016-01-01 | DRG: 885 | Disposition: A | Discharge status: Other Acute Inpatient Hospital | Payer: 59 | Source: Intra-hospital | Attending: Psychiatry | Admitting: Psychiatry

## 2015-12-11 DIAGNOSIS — I129 Hypertensive chronic kidney disease with stage 1 through stage 4 chronic kidney disease, or unspecified chronic kidney disease: Secondary | ICD-10-CM | POA: Diagnosis present

## 2015-12-11 DIAGNOSIS — L0390 Cellulitis, unspecified: Secondary | ICD-10-CM | POA: Diagnosis present

## 2015-12-11 DIAGNOSIS — G4700 Insomnia, unspecified: Secondary | ICD-10-CM | POA: Diagnosis present

## 2015-12-11 DIAGNOSIS — L03115 Cellulitis of right lower limb: Secondary | ICD-10-CM | POA: Diagnosis present

## 2015-12-11 DIAGNOSIS — F319 Bipolar disorder, unspecified: Secondary | ICD-10-CM | POA: Diagnosis not present

## 2015-12-11 DIAGNOSIS — Z9119 Patient's noncompliance with other medical treatment and regimen: Secondary | ICD-10-CM | POA: Diagnosis not present

## 2015-12-11 DIAGNOSIS — Z79899 Other long term (current) drug therapy: Secondary | ICD-10-CM | POA: Diagnosis not present

## 2015-12-11 DIAGNOSIS — Z7982 Long term (current) use of aspirin: Secondary | ICD-10-CM

## 2015-12-11 DIAGNOSIS — Z823 Family history of stroke: Secondary | ICD-10-CM

## 2015-12-11 DIAGNOSIS — E1122 Type 2 diabetes mellitus with diabetic chronic kidney disease: Secondary | ICD-10-CM | POA: Diagnosis present

## 2015-12-11 DIAGNOSIS — I2699 Other pulmonary embolism without acute cor pulmonale: Secondary | ICD-10-CM | POA: Diagnosis present

## 2015-12-11 DIAGNOSIS — E11649 Type 2 diabetes mellitus with hypoglycemia without coma: Secondary | ICD-10-CM | POA: Diagnosis present

## 2015-12-11 DIAGNOSIS — E7800 Pure hypercholesterolemia, unspecified: Secondary | ICD-10-CM | POA: Diagnosis present

## 2015-12-11 DIAGNOSIS — Z833 Family history of diabetes mellitus: Secondary | ICD-10-CM

## 2015-12-11 DIAGNOSIS — Z86711 Personal history of pulmonary embolism: Secondary | ICD-10-CM | POA: Diagnosis not present

## 2015-12-11 DIAGNOSIS — I82411 Acute embolism and thrombosis of right femoral vein: Secondary | ICD-10-CM | POA: Diagnosis present

## 2015-12-11 DIAGNOSIS — N183 Chronic kidney disease, stage 3 (moderate): Secondary | ICD-10-CM | POA: Diagnosis present

## 2015-12-11 DIAGNOSIS — Z7901 Long term (current) use of anticoagulants: Secondary | ICD-10-CM | POA: Diagnosis not present

## 2015-12-11 DIAGNOSIS — E119 Type 2 diabetes mellitus without complications: Secondary | ICD-10-CM

## 2015-12-11 DIAGNOSIS — Z9049 Acquired absence of other specified parts of digestive tract: Secondary | ICD-10-CM | POA: Diagnosis not present

## 2015-12-11 DIAGNOSIS — I872 Venous insufficiency (chronic) (peripheral): Secondary | ICD-10-CM | POA: Diagnosis not present

## 2015-12-11 DIAGNOSIS — I82501 Chronic embolism and thrombosis of unspecified deep veins of right lower extremity: Secondary | ICD-10-CM | POA: Diagnosis not present

## 2015-12-11 DIAGNOSIS — Z794 Long term (current) use of insulin: Secondary | ICD-10-CM | POA: Diagnosis not present

## 2015-12-11 DIAGNOSIS — E669 Obesity, unspecified: Secondary | ICD-10-CM | POA: Diagnosis not present

## 2015-12-11 DIAGNOSIS — E785 Hyperlipidemia, unspecified: Secondary | ICD-10-CM | POA: Diagnosis present

## 2015-12-11 DIAGNOSIS — F312 Bipolar disorder, current episode manic severe with psychotic features: Secondary | ICD-10-CM | POA: Diagnosis not present

## 2015-12-11 DIAGNOSIS — Z6839 Body mass index (BMI) 39.0-39.9, adult: Secondary | ICD-10-CM | POA: Diagnosis not present

## 2015-12-11 LAB — LIPID PANEL
CHOLESTEROL: 143 mg/dL (ref 0–200)
HDL: 32 mg/dL — AB (ref >40)
LDL Cholesterol: 54 mg/dL (ref 0–99)
TRIGLYCERIDES: 286 mg/dL — AB (ref <150)
Total CHOL/HDL Ratio: 4.5 RATIO
VLDL: 57 mg/dL — ABNORMAL HIGH (ref 0–40)

## 2015-12-11 LAB — GLUCOSE, CAPILLARY
GLUCOSE-CAPILLARY: 124 mg/dL — AB (ref 65–99)
Glucose-Capillary: 139 mg/dL — ABNORMAL HIGH (ref 65–99)
Glucose-Capillary: 118 mg/dL — ABNORMAL HIGH (ref 65–99)
Glucose-Capillary: 127 mg/dL — ABNORMAL HIGH (ref 65–99)

## 2015-12-11 LAB — TSH: TSH: 0.405 u[IU]/mL (ref 0.350–4.500)

## 2015-12-11 LAB — CARBAMAZEPINE LEVEL, TOTAL: CARBAMAZEPINE LVL: 6.4 ug/mL (ref 4.0–12.0)

## 2015-12-11 MED ORDER — CARBAMAZEPINE 200 MG PO TABS
200.0000 mg | ORAL_TABLET | Freq: Three times a day (TID) | ORAL | Status: DC
  Administered 2015-12-11 (×2): 200 mg via ORAL
  Filled 2015-12-11: qty 1

## 2015-12-11 MED ORDER — FENOFIBRATE 160 MG PO TABS
160.0000 mg | ORAL_TABLET | Freq: Every day | ORAL | Status: DC
  Administered 2015-12-11: 160 mg via ORAL
  Filled 2015-12-11: qty 1

## 2015-12-11 MED ORDER — ATORVASTATIN CALCIUM 20 MG PO TABS: 40.0000 mg | ORAL_TABLET | Freq: Every day | ORAL | Status: DC

## 2015-12-11 MED ORDER — ACETAMINOPHEN 500 MG PO TABS
ORAL_TABLET | ORAL | Status: AC
  Administered 2015-12-11: 1000 mg via ORAL
  Filled 2015-12-11: qty 2

## 2015-12-11 MED ORDER — ALBIGLUTIDE 50 MG ~~LOC~~ PEN
50.0000 mg | PEN_INJECTOR | SUBCUTANEOUS | Status: AC
  Administered 2015-12-11 – 2015-12-18 (×2): 50 mg via SUBCUTANEOUS
  Filled 2015-12-11 (×2): qty 1

## 2015-12-11 MED ORDER — ENALAPRIL MALEATE 5 MG PO TABS
5.0000 mg | ORAL_TABLET | Freq: Two times a day (BID) | ORAL | Status: DC
  Administered 2015-12-11 (×2): 5 mg via ORAL
  Filled 2015-12-11 (×3): qty 1

## 2015-12-11 MED ORDER — TEMAZEPAM 15 MG PO CAPS
15.0000 mg | ORAL_CAPSULE | Freq: Every evening | ORAL | Status: DC | PRN
  Administered 2015-12-11: 15 mg via ORAL
  Filled 2015-12-11: qty 2

## 2015-12-11 MED ORDER — QUETIAPINE FUMARATE 300 MG PO TABS
800.0000 mg | ORAL_TABLET | Freq: Every day | ORAL | Status: DC
  Administered 2015-12-11: 800 mg via ORAL
  Filled 2015-12-11: qty 4

## 2015-12-11 MED ORDER — CARBAMAZEPINE 200 MG PO TABS
400.0000 mg | ORAL_TABLET | Freq: Two times a day (BID) | ORAL | Status: DC
  Administered 2015-12-11: 600 mg via ORAL
  Filled 2015-12-11: qty 1
  Filled 2015-12-11: qty 3

## 2015-12-11 MED ORDER — LAMOTRIGINE 100 MG PO TABS
400.0000 mg | ORAL_TABLET | Freq: Every day | ORAL | Status: DC
  Administered 2015-12-11: 400 mg via ORAL
  Filled 2015-12-11: qty 2

## 2015-12-11 MED ORDER — CARBAMAZEPINE ER 200 MG PO TB12
600.0000 mg | ORAL_TABLET | Freq: Every day | ORAL | Status: DC
  Administered 2015-12-11 – 2015-12-31 (×21): 600 mg via ORAL
  Filled 2015-12-11 (×20): qty 3

## 2015-12-11 MED ORDER — TEMAZEPAM 15 MG PO CAPS
30.0000 mg | ORAL_CAPSULE | Freq: Every day | ORAL | Status: DC
  Administered 2015-12-11 – 2015-12-12 (×2): 30 mg via ORAL
  Filled 2015-12-11 (×2): qty 2

## 2015-12-11 MED ORDER — CARBAMAZEPINE ER 400 MG PO TB12
600.0000 mg | ORAL_TABLET | Freq: Every day | ORAL | Status: DC
  Filled 2015-12-11: qty 1

## 2015-12-11 MED ORDER — ENALAPRIL MALEATE 5 MG PO TABS
5.0000 mg | ORAL_TABLET | Freq: Two times a day (BID) | ORAL | Status: DC
  Administered 2015-12-11 – 2015-12-30 (×37): 5 mg via ORAL
  Filled 2015-12-11 (×39): qty 1

## 2015-12-11 MED ORDER — ALBIGLUTIDE 50 MG ~~LOC~~ PEN: 50.0000 mg | PEN_INJECTOR | SUBCUTANEOUS | Status: DC

## 2015-12-11 MED ORDER — CANAGLIFLOZIN 100 MG PO TABS
100.0000 mg | ORAL_TABLET | Freq: Every day | ORAL | Status: DC
  Filled 2015-12-11: qty 1

## 2015-12-11 MED ORDER — LAMOTRIGINE 100 MG PO TABS
400.0000 mg | ORAL_TABLET | Freq: Every day | ORAL | Status: DC
  Administered 2015-12-11 – 2015-12-31 (×21): 400 mg via ORAL
  Filled 2015-12-11 (×21): qty 4

## 2015-12-11 MED ORDER — INSULIN ASPART 100 UNIT/ML ~~LOC~~ SOLN
0.0000 [IU] | Freq: Three times a day (TID) | SUBCUTANEOUS | Status: DC
  Administered 2015-12-11 – 2015-12-18 (×6): 1 [IU] via SUBCUTANEOUS
  Administered 2015-12-18: 2 [IU] via SUBCUTANEOUS
  Administered 2015-12-19 – 2015-12-22 (×3): 1 [IU] via SUBCUTANEOUS
  Administered 2015-12-23: 2 [IU] via SUBCUTANEOUS
  Administered 2015-12-24: 1 [IU] via SUBCUTANEOUS
  Administered 2015-12-26: 2 [IU] via SUBCUTANEOUS
  Administered 2015-12-28: 1 [IU] via SUBCUTANEOUS
  Administered 2015-12-28: 2 [IU] via SUBCUTANEOUS
  Administered 2015-12-28 – 2015-12-30 (×3): 1 [IU] via SUBCUTANEOUS
  Administered 2015-12-31 (×2): 2 [IU] via SUBCUTANEOUS
  Administered 2016-01-01: 1 [IU] via SUBCUTANEOUS
  Filled 2015-12-11: qty 2
  Filled 2015-12-11 (×4): qty 1
  Filled 2015-12-11: qty 2
  Filled 2015-12-11: qty 1
  Filled 2015-12-11: qty 2
  Filled 2015-12-11 (×2): qty 1
  Filled 2015-12-11: qty 2
  Filled 2015-12-11: qty 1
  Filled 2015-12-11: qty 2
  Filled 2015-12-11 (×4): qty 1
  Filled 2015-12-11: qty 2

## 2015-12-11 MED ORDER — FENOFIBRATE 160 MG PO TABS
160.0000 mg | ORAL_TABLET | Freq: Every day | ORAL | Status: DC
  Administered 2015-12-12 – 2016-01-01 (×22): 160 mg via ORAL
  Filled 2015-12-11 (×21): qty 1

## 2015-12-11 MED ORDER — WARFARIN SODIUM 4 MG PO TABS: 12.5000 mg | ORAL_TABLET | Freq: Every day | ORAL | Status: DC

## 2015-12-11 MED ORDER — ALUM & MAG HYDROXIDE-SIMETH 200-200-20 MG/5ML PO SUSP: 30.0000 mL | ORAL | Status: DC | PRN

## 2015-12-11 MED ORDER — ENOXAPARIN SODIUM 150 MG/ML ~~LOC~~ SOLN
1.0000 mg/kg | Freq: Two times a day (BID) | SUBCUTANEOUS | Status: DC
  Administered 2015-12-11 – 2015-12-16 (×10): 145 mg via SUBCUTANEOUS
  Filled 2015-12-11 (×11): qty 0.95

## 2015-12-11 MED ORDER — ACETAMINOPHEN 500 MG PO TABS
1000.0000 mg | ORAL_TABLET | Freq: Three times a day (TID) | ORAL | Status: DC | PRN
Start: 2015-12-11 — End: 2015-12-11
  Administered 2015-12-11: 1000 mg via ORAL

## 2015-12-11 MED ORDER — ARIPIPRAZOLE 10 MG PO TABS: 30.0000 mg | ORAL_TABLET | Freq: Every evening | ORAL | Status: DC

## 2015-12-11 MED ORDER — CANAGLIFLOZIN 100 MG PO TABS
100.0000 mg | ORAL_TABLET | ORAL | Status: DC
  Administered 2015-12-12 – 2016-01-01 (×20): 100 mg via ORAL
  Filled 2015-12-11 (×20): qty 1

## 2015-12-11 MED ORDER — QUETIAPINE FUMARATE 200 MG PO TABS
800.0000 mg | ORAL_TABLET | Freq: Every day | ORAL | Status: DC
  Administered 2015-12-11 – 2015-12-31 (×21): 800 mg via ORAL
  Filled 2015-12-11 (×21): qty 4

## 2015-12-11 MED ORDER — INSULIN GLARGINE 100 UNIT/ML ~~LOC~~ SOLN
60.0000 [IU] | Freq: Every day | SUBCUTANEOUS | Status: DC
  Administered 2015-12-11 – 2015-12-17 (×7): 60 [IU] via SUBCUTANEOUS
  Filled 2015-12-11 (×8): qty 0.6

## 2015-12-11 MED ORDER — ACETAMINOPHEN 325 MG PO TABS
650.0000 mg | ORAL_TABLET | Freq: Four times a day (QID) | ORAL | Status: DC | PRN
  Administered 2015-12-13 – 2016-01-01 (×23): 650 mg via ORAL
  Filled 2015-12-11 (×25): qty 2

## 2015-12-11 MED ORDER — ADULT MULTIVITAMIN W/MINERALS CH
1.0000 | ORAL_TABLET | Freq: Every day | ORAL | Status: DC
  Administered 2015-12-11: 1 via ORAL
  Filled 2015-12-11: qty 1

## 2015-12-11 MED ORDER — INSULIN ASPART 100 UNIT/ML ~~LOC~~ SOLN
0.0000 [IU] | Freq: Every day | SUBCUTANEOUS | Status: DC
  Filled 2015-12-11 (×2): qty 1

## 2015-12-11 MED ORDER — INSULIN GLARGINE 100 UNIT/ML ~~LOC~~ SOLN
60.0000 [IU] | Freq: Every day | SUBCUTANEOUS | Status: DC
  Filled 2015-12-11: qty 0.6

## 2015-12-11 MED ORDER — WARFARIN - PHARMACIST DOSING INPATIENT
Freq: Every day | Status: DC
  Administered 2015-12-16 – 2015-12-18 (×3)
  Administered 2015-12-19: 7.5
  Administered 2015-12-20 – 2015-12-29 (×8)

## 2015-12-11 MED ORDER — ARIPIPRAZOLE 10 MG PO TABS
30.0000 mg | ORAL_TABLET | Freq: Every evening | ORAL | Status: DC
  Administered 2015-12-11 – 2015-12-31 (×21): 30 mg via ORAL
  Filled 2015-12-11 (×21): qty 3

## 2015-12-11 MED ORDER — ENOXAPARIN SODIUM 150 MG/ML ~~LOC~~ SOLN
1.0000 mg/kg | Freq: Two times a day (BID) | SUBCUTANEOUS | Status: DC
  Administered 2015-12-11: 145 mg via SUBCUTANEOUS
  Filled 2015-12-11 (×2): qty 0.95

## 2015-12-11 MED ORDER — ATORVASTATIN CALCIUM 20 MG PO TABS
40.0000 mg | ORAL_TABLET | Freq: Every day | ORAL | Status: DC
  Administered 2015-12-12 – 2016-01-01 (×22): 40 mg via ORAL
  Filled 2015-12-11 (×22): qty 2

## 2015-12-11 MED ORDER — WARFARIN SODIUM 2.5 MG PO TABS
12.5000 mg | ORAL_TABLET | Freq: Every day | ORAL | Status: DC
  Filled 2015-12-11: qty 1

## 2015-12-11 MED ORDER — WARFARIN SODIUM 5 MG PO TABS
12.5000 mg | ORAL_TABLET | Freq: Every day | ORAL | Status: DC
  Administered 2015-12-11: 12.5 mg via ORAL
  Filled 2015-12-11 (×2): qty 2.5

## 2015-12-11 MED ORDER — CANAGLIFLOZIN 100 MG PO TABS: 100.0000 mg | ORAL_TABLET | Freq: Every day | ORAL | Status: DC

## 2015-12-11 MED ORDER — CARBAMAZEPINE 200 MG PO TABS
400.0000 mg | ORAL_TABLET | Freq: Every day | ORAL | Status: DC
  Administered 2015-12-12 – 2016-01-01 (×20): 400 mg via ORAL
  Filled 2015-12-11 (×21): qty 2

## 2015-12-11 MED ORDER — MAGNESIUM HYDROXIDE 400 MG/5ML PO SUSP
30.0000 mL | Freq: Every day | ORAL | Status: DC | PRN
  Filled 2015-12-11: qty 30

## 2015-12-11 MED ORDER — WARFARIN - PHARMACIST DOSING INPATIENT
Freq: Every day | Status: DC
  Filled 2015-12-11 (×3): qty 1

## 2015-12-11 NOTE — Progress Notes (Addendum)
ANTICOAGULATION CONSULT NOTE - Initial Consult  Pharmacy Consult for Warfarin/Lovenox Indication: VTE treatment  No Known Allergies  Patient Measurements:   Heparin Dosing Weight:   Vital Signs: BP: 131/73 mmHg (07/10 1500) Pulse Rate: 94 (07/10 1500)  Labs:  Recent Labs  12/10/15 2225  HGB 12.9*  HCT 36.9*  PLT 240  LABPROT 22.5*  INR 1.99  CREATININE 1.60*    Estimated Creatinine Clearance: 80 mL/min (by C-G formula based on Cr of 1.6).   Medical History: Past Medical History  Diagnosis Date  . Bipolar 1 disorder ([place])   . Diabetes mellitus without complication ([place])   . Hypertension   . Hypercholesteremia   . CKD (chronic kidney disease), stage III   . DVT (deep venous thrombosis) ([facility])     Medications:  Scheduled:  . [START ON 12/18/2015] Albiglutide  50 mg Subcutaneous Q7 days  . ARIPiprazole  30 mg Oral QPM  . [START ON 12/12/2015] atorvastatin  40 mg Oral Daily  . [START ON 12/12/2015] canagliflozin  100 mg Oral QAC breakfast  . carbamazepine  600 mg Oral QHS  . [START ON 12/12/2015] carbamazepine  400 mg Oral Q breakfast  . enalapril  5 mg Oral BID  . enoxaparin (LOVENOX) injection  1 mg/kg Subcutaneous BID  . [START ON 12/12/2015] fenofibrate  160 mg Oral Daily  . insulin aspart  0-5 Units Subcutaneous QHS  . insulin aspart  0-9 Units Subcutaneous TID WC  . insulin glargine  60 Units Subcutaneous Daily  . lamoTRIgine  400 mg Oral QHS  . QUEtiapine  800 mg Oral QHS  . temazepam  30 mg Oral QHS  . [START ON 12/12/2015] warfarin  12.5 mg Oral q1800  . Warfarin - Pharmacist Dosing Inpatient   Does not apply q1800    Assessment: Pharmacy consulted to dose warfarin for VTE treatment. in this 50 year old male.    7/9 @ 22:25 :  INR = 1.99 Last dose was given on 7/10 @ 200.    Goal of Therapy:  INR 2-3   Plan:  Will continue pt on home dose of warfarin 12.5 mg PO daily and recheck INR on 7/11 with AM labs.  Will continue lovenox 145 mg SQ Q12H until  INR is therapeutic.   Tatem Fesler D 12/11/2015,4:36 PM

## 2015-12-11 NOTE — Telephone Encounter (Signed)
Pt was scheduled for new pt Coumadin Clinic appt today 12/11/15 at 2:30pm.  Pt is currently admitted to [facility], cancelled new pt appt for today.  Pt will need to have Coumadin follow-up appt scheduled with pt's primary MD office, this appt was scheduled in error, pt does not have a [facility] MD/cardiologist therefore he cannot be seen in our Coumadin Clinic. Attempted to contact and notify pt/pt's wife, LMOM TCB.

## 2015-12-11 NOTE — ED Notes (Signed)
Report called to [facility] at ED [facility].

## 2015-12-11 NOTE — Progress Notes (Signed)
Patient with bright affect, hyperverbal. Patient with good memory, remembering staff names from last admission. Patient states he has been managing his bipolar disorder at home with his wife's help and wife suggested he come to hospital and be checked for mania. No SI/HI at this time. Skin check with no contraband found. Skin check with reddened rash noted to bilateral lower extremities. Dinner tray ordered carb controlled diet. Blood glucose monitored and recorded with no s/s of hypo/hyperglycemia. Meds to be administered on arrival from pharmacy. Safety maintained.

## 2015-12-11 NOTE — ED Notes (Signed)
Albiglutide not on hospital formulary.  Patient instructed that he would need to call his wife in the morning and have her bring this medication from home.

## 2015-12-11 NOTE — ED Notes (Signed)
Patient remains calm and cooperative.No issues to report this shift. Q15 minute monitoring and security observation continues for safety.

## 2015-12-11 NOTE — Progress Notes (Signed)
Patient to [facility] from ED ambulatory without difficulty, to room.  Patient is alert and and in no apparent distress. Patient talked about having "satin buds" at home that he used his 401k to purchase, that he had to call his wife to warn his daughter about driving and State troopers, that he was going to pray for everyone in the [place] "to get better" and that he likes animals and saves them.Patient denies SI, HI. Patient talkative but cooperative.Patient made aware of security cameras and Q15 minute rounds. Patient encouraged to let Nursing staff know of any concerns or needs.

## 2015-12-11 NOTE — Progress Notes (Signed)
Patient is to be admitted to [facility] by Dr. Jerilee Hoh.  Attending Physician will be Dr. Bary Leriche.   Patient has been assigned to room 312 , by [facility] Nurse Marcie Bal.   Intake Paper Work has been signed and placed on patient chart.  ER staff is aware of the admission [facility].; Dr. Darl Householder, ER MD; Abigail Butts  Patient's Nurse & Gerhard Perches Patient Access).   12/11/2015 Con Memos, MS, [place], [facility]CA Therapeutic Triage Specialist

## 2015-12-11 NOTE — [facility] Group Notes (Signed)
[facility] Group Notes:  (Nursing/MHT/Case Management/Adjunct)  Date:  12/11/2015  Time:  9:22 PM  Type of Therapy:  Group Therapy  Participation Level:  Active  Participation Quality:  Appropriate  Affect:  Appropriate  Cognitive:  Appropriate  Insight:  Appropriate  Engagement in Group:  Engaged  Modes of Intervention:  Discussion  Summary of Progress/Problems:  Kandis Fantasia 12/11/2015, 9:22 PM

## 2015-12-11 NOTE — Tx Team (Signed)
Initial Interdisciplinary Treatment Plan   PATIENT STRESSORS: Health problems Medication change or noncompliance   PATIENT STRENGTHS: Average or above average intelligence Capable of independent living Communication skills   PROBLEM LIST: Problem List/Patient Goals Date to be addressed Date deferred Reason deferred Estimated date of resolution  Medicine adjustments  7/10           Mania 7/10                                          DISCHARGE CRITERIA:  Adequate post-discharge living arrangements Improved stabilization in mood, thinking, and/or behavior Medical problems require only outpatient monitoring Motivation to continue treatment in a less acute level of care  PRELIMINARY DISCHARGE PLAN: Attend aftercare/continuing care group Outpatient therapy Return to previous living arrangement  PATIENT/FAMIILY INVOLVEMENT: This treatment plan has been presented to and reviewed with the patient, Cameron [name], and/or family member, .  The patient and family have been given the opportunity to ask questions and make suggestions.  [name] 12/11/2015, 5:25 PM

## 2015-12-11 NOTE — ED Notes (Signed)
Patient states that His headache has improved.

## 2015-12-11 NOTE — Consult Note (Signed)
[place] [facility] Consult   Reason for Consult:  mania Referring Physician:  ER Patient Identification: Cameron [name] MRN:  [id number] Principal Diagnosis: Bipolar 1 disorder Cameron [name]) Diagnosis:   Patient Active Problem List   Diagnosis Date Noted  . Pulmonary embolism (Cameron [name]) [I26.99] 12/04/2015  . Bipolar 1 disorder (Cameron [name]) [F31.9]   . Diabetes mellitus without complication (Cameron [name]) [W62.0]   . Hypercholesteremia [E78.00]   . CKD (chronic kidney disease), stage III [N18.3]   . Acute deep vein thrombosis (DVT) of femoral vein of right lower extremity (Cameron [name]) [I82.411]     Total Time spent with patient: 1 hour  Subjective:   Cameron [name] is a 50 y.o. male patient admitted with mania.  HPI:    The patient is a 50 year old married Caucasian male who carries a diagnosis of bipolar disorder type I. He came in with family on July 9 requesting psychiatric evaluation for mania. Just a few days ago this patient was admitted at [name] for a pulmonary embolism and DVT.  His wife reports that the patient did not receive his psychiatric medications for 24 hours and when the medications were reassessed started the doses were incorrectly he did not receive the correct dose for Tegretol.  Patient is currently under psychiatric care by Dr. Thurmond Butts will is prescribing him with Abilify 30 mg a day, Tegretol 400 mg Every morning and 600 mg at bedtime, Seroquel 800 mg daily at bedtime, Lamictal 400 mg at bedtime and Restoril 60m daily at bedtime.  Patient has had a multitude of psychiatric hospitalizations due to mania. He has had ECT at least 3 times in the past. Wife reports that once he becomes manic he usually ECT is the only thing that will help.  Wife reports that at home he was hyperverbal ("he was talking nonstop"), he had random thoughts, he started given things away, and was not sleeping or at times only sleeping for 2 hours. The wife reported that he was highly energetic and ready to go  in the morning after only sleeping for 2 hours. She reports that he has been complaint with all his medications.   Family and patient both deny patient is abusing alcohol or any illicit substances.  Patient was interviewed today he was hyperverbal and tangential. He was calm and cooperative with the assessment. Patient is okay with voluntary admission to the psychiatric unit.  Patient denies today having SI, HI or auditory or visual hallucinations. He denies having any physical complaints or side effects from his medications.  Past Psychiatric History: Multiple psychiatric hospitalizations for mania patient says he has had at least 8 or 10 hospitalizations. He has had ECT in the past and he responded quite well  Risk to Self: Suicidal Ideation: No Suicidal Intent: No Is patient at risk for suicide?: No Suicidal Plan?: No Access to Means: No What has been your use of drugs/alcohol within the last 12 months?: None reported by patient How many times?: 0 Other Self Harm Risks: None reported Triggers for Past Attempts: None known Intentional Self Injurious Behavior: None Risk to Others: Homicidal Ideation: No Thoughts of Harm to Others: No Current Homicidal Intent: No Current Homicidal Plan: No Access to Homicidal Means: No Identified Victim: None identified History of harm to others?: No Assessment of Violence: None Noted Violent Behavior Description: None identified Does patient have access to weapons?: No Criminal Charges Pending?: No Does patient have a court date: No Prior Inpatient Therapy: Prior Inpatient Therapy: Yes Prior Therapy Dates: 2009  Prior Therapy Facilty/Provider(s): [facility] Reason for Treatment: bipolar Prior Outpatient Therapy: Prior Outpatient Therapy: Yes Prior Therapy Dates: current Prior Therapy Facilty/Provider(s): Dr. Thurmond Butts Reason for Treatment: bipolar Does patient have an ACCT team?: No Does patient have Intensive In-House Services?  : No Does patient have  [facility] services? : No Does patient have P4CC services?: No  Past Medical History:  Past Medical History  Diagnosis Date  . Bipolar 1 disorder (Cameron [name])   . Diabetes mellitus without complication (Cameron [name])   . Hypertension   . Hypercholesteremia   . CKD (chronic kidney disease), stage III   . DVT (deep venous thrombosis) [facility])     Past Surgical History  Procedure Laterality Date  . Appendectomy     Family History:  Family History  Problem Relation Age of Onset  . Stroke Other   . Diabetes Other    Family Psychiatric  History: Patient reports that his father and his grandmother both were bipolar  Social History: Patient is a Civil engineer, contracting. He is married and lives at home with his wife and their 77 year old daughter. History  Alcohol Use  . Yes    Comment: rarely     History  Drug Use No    Social History   Social History  . Marital Status: Married    Spouse Name: N/A  . Number of Children: N/A  . Years of Education: N/A   Social History Main Topics  . Smoking status: Never Smoker   . Smokeless tobacco: None  . Alcohol Use: Yes     Comment: rarely  . Drug Use: No  . Sexual Activity: Not Asked   Other Topics Concern  . None   Social History Narrative   Additional Social History:    Allergies:  No Known Allergies  Labs:  Results for orders placed or performed during the hospital encounter of 12/10/15 (from the past 48 hour(s))  CBC with Differential     Status: Abnormal   Collection Time: 12/10/15 10:25 PM  Result Value Ref Range   WBC 4.1 3.8 - 10.6 K/uL   RBC 4.32 (L) 4.40 - 5.90 MIL/uL   Hemoglobin 12.9 (L) 13.0 - 18.0 g/dL   HCT 36.9 (L) 40.0 - 52.0 %   MCV 85.5 80.0 - 100.0 fL   MCH 29.8 26.0 - 34.0 pg   MCHC 34.8 32.0 - 36.0 g/dL   RDW 12.7 11.5 - 14.5 %   Platelets 240 150 - 440 K/uL   Neutrophils Relative % 60 %   Neutro Abs 2.5 1.4 - 6.5 K/uL   Lymphocytes Relative 26 %   Lymphs Abs 1.1 1.0 - 3.6 K/uL   Monocytes Relative 13 %   Monocytes  Absolute 0.5 0.2 - 1.0 K/uL   Eosinophils Relative 0 %   Eosinophils Absolute 0.0 0 - 0.7 K/uL   Basophils Relative 1 %   Basophils Absolute 0.0 0 - 0.1 K/uL  Comprehensive metabolic panel     Status: Abnormal   Collection Time: 12/10/15 10:25 PM  Result Value Ref Range   Sodium 140 135 - 145 mmol/L   Potassium 3.8 3.5 - 5.1 mmol/L   Chloride 108 101 - 111 mmol/L   CO2 25 22 - 32 mmol/L   Glucose, Bld 151 (H) 65 - 99 mg/dL   BUN 20 6 - 20 mg/dL   Creatinine, Ser 1.60 (H) 0.61 - 1.24 mg/dL   Calcium 9.0 8.9 - 10.3 mg/dL   Total Protein 7.5 6.5 - 8.1 g/dL  Albumin 4.3 3.5 - 5.0 g/dL   AST 70 (H) 15 - 41 U/L   ALT 72 (H) 17 - 63 U/L   Alkaline Phosphatase 38 38 - 126 U/L   Total Bilirubin 0.6 0.3 - 1.2 mg/dL   GFR calc non Af Amer 48 (L) >60 mL/min   GFR calc Af Amer 56 (L) >60 mL/min    Comment: (NOTE) The eGFR has been calculated using the CKD EPI equation. This calculation has not been validated in all clinical situations. eGFR's persistently <60 mL/min signify possible Chronic Kidney Disease.    Anion gap 7 5 - 15  Ethanol     Status: None   Collection Time: 12/10/15 10:25 PM  Result Value Ref Range   Alcohol, Ethyl (B) <5 <5 mg/dL    Comment:        LOWEST DETECTABLE LIMIT FOR SERUM ALCOHOL IS 5 mg/dL FOR MEDICAL PURPOSES ONLY   Acetaminophen level     Status: Abnormal   Collection Time: 12/10/15 10:25 PM  Result Value Ref Range   Acetaminophen (Tylenol), Serum <10 (L) 10 - 30 ug/mL    Comment:        THERAPEUTIC CONCENTRATIONS VARY SIGNIFICANTLY. A RANGE OF 10-30 ug/mL MAY BE AN EFFECTIVE CONCENTRATION FOR MANY PATIENTS. HOWEVER, SOME ARE BEST TREATED AT CONCENTRATIONS OUTSIDE THIS RANGE. ACETAMINOPHEN CONCENTRATIONS >150 ug/mL AT 4 HOURS AFTER INGESTION AND >50 ug/mL AT 12 HOURS AFTER INGESTION ARE OFTEN ASSOCIATED WITH TOXIC REACTIONS.   Salicylate level     Status: None   Collection Time: 12/10/15 10:25 PM  Result Value Ref Range   Salicylate Lvl  <5.6 2.8 - 30.0 mg/dL  Protime-INR     Status: Abnormal   Collection Time: 12/10/15 10:25 PM  Result Value Ref Range   Prothrombin Time 22.5 (H) 11.4 - 15.0 seconds   INR 1.99   Carbamazepine level, total     Status: None   Collection Time: 12/10/15 10:25 PM  Result Value Ref Range   Carbamazepine Lvl 6.4 4.0 - 12.0 ug/mL  Urinalysis complete, with microscopic ([facility] only)     Status: Abnormal   Collection Time: 12/10/15 11:36 PM  Result Value Ref Range   Color, Urine YELLOW (A) YELLOW   APPearance CLEAR (A) CLEAR   Glucose, UA >500 (A) NEGATIVE mg/dL   Bilirubin Urine NEGATIVE NEGATIVE   Ketones, ur NEGATIVE NEGATIVE mg/dL   Specific Gravity, Urine 1.012 1.005 - 1.030   Hgb urine dipstick NEGATIVE NEGATIVE   pH 5.0 5.0 - 8.0   Protein, ur NEGATIVE NEGATIVE mg/dL   Nitrite NEGATIVE NEGATIVE   Leukocytes, UA NEGATIVE NEGATIVE   RBC / HPF NONE SEEN 0 - 5 RBC/hpf   WBC, UA 0-5 0 - 5 WBC/hpf   Bacteria, UA NONE SEEN NONE SEEN   Squamous Epithelial / LPF 0-5 (A) NONE SEEN  Urine Drug Screen, Qualitative ([facility] only)     Status: Abnormal   Collection Time: 12/10/15 11:36 PM  Result Value Ref Range   Tricyclic, Ur Screen POSITIVE (A) NONE DETECTED   Amphetamines, Ur Screen NONE DETECTED NONE DETECTED   MDMA (Ecstasy)Ur Screen NONE DETECTED NONE DETECTED   Cocaine Metabolite,Ur [place] NONE DETECTED NONE DETECTED   Opiate, Ur Screen NONE DETECTED NONE DETECTED   Phencyclidine (PCP) Ur S NONE DETECTED NONE DETECTED   Cannabinoid 50 Ng, Ur [place] NONE DETECTED NONE DETECTED   Barbiturates, Ur Screen NONE DETECTED NONE DETECTED   Benzodiazepine, Ur Scrn POSITIVE (A) NONE DETECTED   Methadone  Scn, Ur NONE DETECTED NONE DETECTED    Comment: (NOTE) 329  Tricyclics, urine               Cutoff 1000 ng/mL 200  Amphetamines, urine             Cutoff 1000 ng/mL 300  MDMA (Ecstasy), urine           Cutoff 500 ng/mL 400  Cocaine Metabolite, urine       Cutoff 300 ng/mL 500  Opiate, urine                    Cutoff 300 ng/mL 600  Phencyclidine (PCP), urine      Cutoff 25 ng/mL 700  Cannabinoid, urine              Cutoff 50 ng/mL 800  Barbiturates, urine             Cutoff 200 ng/mL 900  Benzodiazepine, urine           Cutoff 200 ng/mL 1000 Methadone, urine                Cutoff 300 ng/mL 1100 1200 The urine drug screen provides only a preliminary, unconfirmed 1300 analytical test result and should not be used for non-medical 1400 purposes. Clinical consideration and professional judgment should 1500 be applied to any positive drug screen result due to possible 1600 interfering substances. A more specific alternate chemical method 1700 must be used in order to obtain a confirmed analytical result.  1800 Gas chromato graphy / mass spectrometry (GC/MS) is the preferred 1900 confirmatory method.   Glucose, capillary     Status: Abnormal   Collection Time: 12/11/15  7:59 AM  Result Value Ref Range   Glucose-Capillary 139 (H) 65 - 99 mg/dL  Glucose, capillary     Status: Abnormal   Collection Time: 12/11/15 12:31 PM  Result Value Ref Range   Glucose-Capillary 118 (H) 65 - 99 mg/dL    Current Facility-Administered Medications  Medication Dose Route Frequency Provider Last Rate Last Dose  . Albiglutide PEN 50 mg  50 mg Subcutaneous Q7 days Paulette Blanch, MD   50 mg at 12/11/15 0317  . ARIPiprazole (ABILIFY) tablet 30 mg  30 mg Oral QPM Paulette Blanch, MD      . atorvastatin (LIPITOR) tablet 40 mg  40 mg Oral Daily Paulette Blanch, MD   40 mg at 12/11/15 0903  . carbamazepine (TEGRETOL) tablet 200 mg  200 mg Oral TID Wandra Arthurs, MD   200 mg at 12/11/15 0929  . enalapril (VASOTEC) tablet 5 mg  5 mg Oral BID Paulette Blanch, MD   5 mg at 12/11/15 0930  . enoxaparin (LOVENOX) injection 145 mg  1 mg/kg Subcutaneous BID Paulette Blanch, MD   145 mg at 12/11/15 0223  . fenofibrate tablet 160 mg  160 mg Oral Daily Paulette Blanch, MD   160 mg at 12/11/15 0929  . insulin glargine (LANTUS) injection 60 Units  60 Units  Subcutaneous Daily Paulette Blanch, MD   60 Units at 12/11/15 [phone]  . lamoTRIgine (LAMICTAL) tablet 400 mg  400 mg Oral QHS Paulette Blanch, MD   400 mg at 12/11/15 0227  . multivitamin with minerals tablet 1 tablet  1 tablet Oral Daily Paulette Blanch, MD   1 tablet at 12/11/15 0929  . QUEtiapine (SEROQUEL) tablet 800 mg  800 mg Oral QHS Paulette Blanch, MD  800 mg at 12/11/15 0300  . temazepam (RESTORIL) capsule 15 mg  15 mg Oral QHS PRN Paulette Blanch, MD   15 mg at 12/11/15 0304  . warfarin (COUMADIN) tablet 12.5 mg  12.5 mg Oral q1800 Paulette Blanch, MD      . Warfarin - Pharmacist Dosing Inpatient   Does not apply Amb[place], MD       Current Outpatient Prescriptions  Medication Sig Dispense Refill  . ARIPiprazole (ABILIFY) 30 MG tablet Take 30 mg by mouth every evening.     Marland Kitchen atorvastatin (LIPITOR) 40 MG tablet Take 40 mg by mouth daily.  11  . Canagliflozin (INVOKANA) 100 MG TABS Take 100 mg by mouth daily with breakfast.     . carbamazepine (TEGRETOL) 200 MG tablet Take 400-600 mg by mouth 2 (two) times daily. Take 2 tablets in the morning Take 3 tablets in the evening    . enalapril (VASOTEC) 5 MG tablet Take 5 mg by mouth 2 (two) times daily.    Marland Kitchen enoxaparin (LOVENOX) 150 MG/ML injection Inject 1 mL (150 mg total) into the skin every 12 (twelve) hours. 8 Syringe 0  . fenofibrate 160 MG tablet Take 160 mg by mouth daily.    . Insulin Glargine (LANTUS SOLOSTAR) 100 UNIT/ML Solostar Pen Inject 60 Units into the skin daily.     Marland Kitchen lamoTRIgine (LAMICTAL) 100 MG tablet Take 400 mg by mouth at bedtime.    . Multiple Vitamin (MULTIVITAMIN WITH MINERALS) TABS tablet Take 1 tablet by mouth daily.    . QUEtiapine (SEROQUEL) 400 MG tablet Take 800 mg by mouth at bedtime.    . temazepam (RESTORIL) 15 MG capsule Take 15 mg by mouth at bedtime as needed for sleep.    Marland Kitchen warfarin (COUMADIN) 5 MG tablet Take 2 tablets (10 mg total) by mouth daily. 60 tablet 0  . TANZEUM 50 MG PEN Inject 50 mg into the skin every 7  (seven) days.  2    Musculoskeletal: Strength & Muscle Tone: within normal limits Gait & Station: normal Patient leans: N/A  Psychiatric Specialty Exam: Physical Exam  Constitutional: He is oriented to person, place, and time. He appears well-developed and well-nourished.  HENT:  Head: Normocephalic and atraumatic.  Eyes: EOM are normal.  Neck: Normal range of motion.  Respiratory: Effort normal.  Musculoskeletal: Normal range of motion.  Neurological: He is alert and oriented to person, place, and time.    Review of Systems  Constitutional: Negative.   HENT: Negative.   Eyes: Negative.   Respiratory: Negative.   Cardiovascular: Negative.   Gastrointestinal: Negative.   Genitourinary: Negative.   Skin: Negative.   Neurological: Negative.   Psychiatric/Behavioral: Negative.     Blood pressure 114/59, pulse 111, temperature 98.6 F (37 C), temperature source Oral, resp. rate 18, SpO2 96 %.There is no weight on file to calculate BMI.  General Appearance: Disheveled  Eye Contact:  Good  Speech:  Clear and Coherent  Volume:  Normal  Mood:  Euphoric  Affect:  Congruent  Thought Process:  Disorganized and Descriptions of Associations: Tangential  Orientation:  Full (Time, Place, and Person)  Thought Content:  Hallucinations: None  Suicidal Thoughts:  No  Homicidal Thoughts:  No  Memory:  Immediate;   Fair Recent;   Fair Remote;   Good  Judgement:  Impaired  Insight:  Shallow  Psychomotor Activity:  Increased  Concentration:  Concentration: Fair and Attention Span: Fair  Recall:  Fair  Fund of Knowledge:  Good  Language:  Good  Akathisia:  No  Handed:    AIMS (if indicated):     Assets:  Agricultural consultant Housing Social Support  ADL's:  Intact  Cognition:  WNL  Sleep:        Treatment Plan Summary: Daily contact with patient to assess and evaluate symptoms and progress in treatment and Medication management  Bipolar  disorder continue Tegretol 400 mg in the morning and 600 mg in the evening, Lamictal 400 mg daily at bedtime. Continue Seroquel 800 mg at bedtime and Abilify 30 mg a day  Insomnia continue Restoril but I will increase the dose to 30 mg daily at bedtime  Cholesterol continue Lipitor 40 mg a day and fenofibrate 160 mg  Hypertension continue enalapril 5 mg a day  Diabetes continue Lantus 60 units at bedtime, continue Invokanna100 mg with breakfast  DVT and PE continue Coumadin and Lovenox.  Precautions every 15 minute checks  Diet low sodium and carb modified  Labs I will order prolactin, hemoglobin A1c, lipid panel  Hospitalization and status voluntary  Consider ECT referral.  Collateral information has been obtained from his wife  Disposition: Recommend psychiatric Inpatient admission when medically cleared.  Hildred Priest, MD 12/11/2015 1:28 PM

## 2015-12-11 NOTE — BH Assessment (Signed)
Assessment Note  Cameron [name] is an 50 y.o. male, with history of bipolar, presenting to ED voluntarily for manic behavior.  According to patient's wife, pt has been  "riding high" since being  discharged Thursday from [facility].  Pt reports he stayed for 5 days with DVT and PE and feels he's fortunate to be alive.  Pt reports he wife was "nagging him about coming to the ED.  He says that he is here to "make them happy" (wife and daughter) and "to chill".  Pt reports he was last hospitalized about 6 years ago for his bipolar disorder.  He states that he is compliant with him medications and has an appointment on Wednesday to see his psychiatrist, Dr. Thurmond Butts.    Diagnosis: Bipolar Disorder  Past Medical History:  Past Medical History  Diagnosis Date  . Bipolar 1 disorder ([place])   . Diabetes mellitus without complication ([place])   . Hypertension   . Hypercholesteremia   . CKD (chronic kidney disease), stage III   . DVT (deep venous thrombosis) [facility])     Past Surgical History  Procedure Laterality Date  . Appendectomy      Family History:  Family History  Problem Relation Age of Onset  . Stroke Other   . Diabetes Other     Social History:  reports that he has never smoked. He does not have any smokeless tobacco history on file. He reports that he drinks alcohol. He reports that he does not use illicit drugs.  Additional Social History:  Alcohol / Drug Use History of alcohol / drug use?: No history of alcohol / drug abuse  CIWA: CIWA-Ar BP: (!) 127/108 mmHg Pulse Rate: (!) 107 COWS:    Allergies: No Known Allergies  Home Medications:  (Not in a hospital admission)  OB/GYN Status:  No LMP for male patient.  General Assessment Data Location of Assessment: [facility] ED TTS Assessment: In system Is this a Tele or Face-to-Face Assessment?: Face-to-Face Is this an Initial Assessment or a Re-assessment for this encounter?: Initial Assessment Marital status: Married [place] name:  n/a Is patient pregnant?: No Pregnancy Status: No Living Arrangements: Spouse/significant other, Children Can pt return to current living arrangement?: Yes Admission Status: Voluntary Is patient capable of signing voluntary admission?: Yes Referral Source: Self/Family/Friend Insurance type: [facility]  Medical Screening Exam ([place]) Medical Exam completed: Yes  Crisis Care Plan Living Arrangements: Spouse/significant other, Children Legal Guardian: Other: (self) Name of Psychiatrist: Dr. Thurmond Butts Name of Therapist: Dr. Thurmond Butts  Education Status Is patient currently in school?: No Current Grade: n/a Highest grade of school patient has completed: 12 Name of school: n/a Contact person: n/a  Risk to self with the past 6 months Suicidal Ideation: No Has patient been a risk to self within the past 6 months prior to admission? : No Suicidal Intent: No Has patient had any suicidal intent within the past 6 months prior to admission? : No Is patient at risk for suicide?: No Suicidal Plan?: No Has patient had any suicidal plan within the past 6 months prior to admission? : No Access to Means: No What has been your use of drugs/alcohol within the last 12 months?: None reported by patient Previous Attempts/Gestures: No How many times?: 0 Other Self Harm Risks: None reported Triggers for Past Attempts: None known Intentional Self Injurious Behavior: None Family Suicide History: No Recent stressful life event(s): Recent negative physical changes Persecutory voices/beliefs?: No Depression: No Substance abuse history and/or treatment for substance  abuse?: No Suicide prevention information given to non-admitted patients: Not applicable  Risk to Others within the past 6 months Homicidal Ideation: No Does patient have any lifetime risk of violence toward others beyond the six months prior to admission? : No Thoughts of Harm to Others: No Current Homicidal Intent: No Current Homicidal  Plan: No Access to Homicidal Means: No Identified Victim: None identified History of harm to others?: No Assessment of Violence: None Noted Violent Behavior Description: None identified Does patient have access to weapons?: No Criminal Charges Pending?: No Does patient have a court date: No Is patient on probation?: No  Psychosis Hallucinations: None noted Delusions: Grandiose  Mental Status Report Appearance/Hygiene: In scrubs Eye Contact: Good Motor Activity: Freedom of movement Speech: Rapid Level of Consciousness: Alert Mood: Elated, Pleasant Affect: Appropriate to circumstance Anxiety Level: None Thought Processes: Flight of Ideas Judgement: Partial Orientation: Person, Place, Time, Situation Obsessive Compulsive Thoughts/Behaviors: Minimal  Cognitive Functioning Concentration: Good Memory: Recent Intact, Remote Intact IQ: Average Insight: Good Impulse Control: Good Appetite: Good Weight Loss: 0 Weight Gain: 0 Sleep: Decreased Total Hours of Sleep: 4 Vegetative Symptoms: None  ADLScreening [facility] Assessment Services) Patient's cognitive ability adequate to safely complete daily activities?: Yes Patient able to express need for assistance with ADLs?: Yes Independently performs ADLs?: Yes (appropriate for developmental age)  Prior Inpatient Therapy Prior Inpatient Therapy: Yes Prior Therapy Dates: 2009 Prior Therapy Facilty/Provider(s): [facility] Reason for Treatment: bipolar  Prior Outpatient Therapy Prior Outpatient Therapy: Yes Prior Therapy Dates: current Prior Therapy Facilty/Provider(s): Dr. Thurmond Butts Reason for Treatment: bipolar Does patient have an ACCT team?: No Does patient have Intensive In-House Services?  : No Does patient have [facility] services? : No Does patient have P4CC services?: No  ADL Screening (condition at time of admission) Patient's cognitive ability adequate to safely complete daily activities?: Yes Patient able to express need for  assistance with ADLs?: Yes Independently performs ADLs?: Yes (appropriate for developmental age)       Abuse/Neglect Assessment (Assessment to be complete while patient is alone) Physical Abuse: Denies Verbal Abuse: Denies Sexual Abuse: Denies Exploitation of patient/patient's resources: Denies Self-Neglect: Denies Values / Beliefs Cultural Requests During Hospitalization: None Spiritual Requests During Hospitalization: None Consults Spiritual Care Consult Needed: No Social Work Consult Needed: No Regulatory affairs officer (For Healthcare) Does patient have an advance directive?: No Would patient like information on creating an advanced directive?: No - patient declined information    Additional Information 1:1 In Past 12 Months?: No CIRT Risk: No Elopement Risk: No Does patient have medical clearance?: Yes     Disposition:  Disposition Initial Assessment Completed for this Encounter: Yes Disposition of Patient: Other dispositions Other disposition(s): Other (Comment) (Pending Psych MD consult)  On Site Evaluation by:   Reviewed with Physician:    Barnaby Rippeon C Rhenda [place] 12/11/2015 1:17 AM

## 2015-12-11 NOTE — ED Notes (Signed)
Nurse administered tylenol for headache to Patient. Patient states that pain was moderate and He took tylenol at home.

## 2015-12-11 NOTE — ED Notes (Signed)
Patient with discharge orders to be admitted to [facility], Patient aware and cooperative, patient transferred via w/c with police custody. Patient's belongings taken with him.

## 2015-12-11 NOTE — ED Notes (Signed)
Called report to [facility] in [place], patient transferring to room 312.

## 2015-12-11 NOTE — Progress Notes (Signed)
ANTICOAGULATION CONSULT NOTE - Initial Consult  Pharmacy Consult for LMWH/VKA Indication: VTE treatment  No Known Allergies  Patient Measurements:   Heparin Dosing Weight:   Vital Signs: Temp: 98.5 F (36.9 C) (07/10 0030) Temp Source: Oral (07/10 0030) BP: 127/108 mmHg (07/10 0030) Pulse Rate: 107 (07/10 0030)  Labs:  Recent Labs  12/10/15 2225  HGB 12.9*  HCT 36.9*  PLT 240  LABPROT 22.5*  INR 1.99  CREATININE 1.60*    Estimated Creatinine Clearance: 80 mL/min (by C-G formula based on Cr of 1.6).   Medical History: Past Medical History  Diagnosis Date  . Bipolar 1 disorder ([place])   . Diabetes mellitus without complication ([place])   . Hypertension   . Hypercholesteremia   . CKD (chronic kidney disease), stage III   . DVT (deep venous thrombosis) (HCC)     Medications:  Infusions:    Assessment: 50 yom cc family concern. Recently discharged from [facility] with DVT/PE, had been bridging LMWH and VKA, INR tonight 1.99.   Goal of Therapy:  INR 2-3 Monitor platelets by anticoagulation protocol: Yes   Plan:  Lovenox 145 mg subcutaneously BID until INR > 2 for 2 days. Warfarin 12.5 mg po daily, last dose Saturday evening. Will give one dose now, then start maintenance doses at 1800. Daily INRs.  Laural Benes, Pharm.D., BCPS Clinical Pharmacist 12/11/2015,1:28 AM

## 2015-12-11 NOTE — ED Notes (Signed)
Patient did not want to eat breakfast, He is alert and oriented, blood sugar obtained and it was 138, patient is talkative and will change topics rapidly, Patient states that He has been bi-polar since He was 50 years old and that stress makes him worse, talked about His mom that had almost died. Patient denies Si.Hi and avh. Patient is polite and pleasant, but is scattered in thought process. q 15 min. Check s and camera surveillance at all times.

## 2015-12-11 NOTE — ED Notes (Signed)
Patient is alert and oriented, Nurse gave Patient items to take a shower, He is calm and cooperative, q 15 min. Checks and He has camera surveillance at all times.

## 2015-12-11 NOTE — Progress Notes (Signed)
Report received from Dawn, RN

## 2015-12-11 NOTE — ED Notes (Signed)
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 Checks.

## 2015-12-12 ENCOUNTER — Encounter: Payer: Self-pay | Admitting: Psychiatry

## 2015-12-12 DIAGNOSIS — F312 Bipolar disorder, current episode manic severe with psychotic features: Principal | ICD-10-CM

## 2015-12-12 LAB — PROLACTIN: PROLACTIN: 4.4 ng/mL (ref 4.0–15.2)

## 2015-12-12 LAB — GLUCOSE, CAPILLARY
GLUCOSE-CAPILLARY: 131 mg/dL — AB (ref 65–99)
GLUCOSE-CAPILLARY: 120 mg/dL — AB (ref 65–99)
GLUCOSE-CAPILLARY: 80 mg/dL (ref 65–99)
Glucose-Capillary: 141 mg/dL — ABNORMAL HIGH (ref 65–99)

## 2015-12-12 LAB — PROTIME-INR
INR: 2.97
Prothrombin Time: 30.4 seconds — ABNORMAL HIGH (ref 11.4–15.0)

## 2015-12-12 MED ORDER — WARFARIN SODIUM 4 MG PO TABS: 10.0000 mg | ORAL_TABLET | Freq: Every day | ORAL | Status: DC

## 2015-12-12 MED ORDER — WARFARIN SODIUM 4 MG PO TABS: 11.0000 mg | ORAL_TABLET | Freq: Every day | ORAL | Status: DC

## 2015-12-12 NOTE — BHH Suicide Risk Assessment (Signed)
[facility] Admission Suicide Risk Assessment   Nursing information obtained from:    Demographic factors:    Current Mental Status:    Loss Factors:    Historical Factors:    Risk Reduction Factors:     Total Time spent with patient: 1 hour Principal Problem: Bipolar I disorder, current or most recent episode manic, with psychotic features [facility]) Diagnosis:   Patient Active Problem List   Diagnosis Date Noted  . Bipolar I disorder, current or most recent episode manic, with psychotic features ([place]) [F31.2] 12/11/2015  . Pulmonary embolism ([place]) [I26.99] 12/04/2015  . Diabetes mellitus without complication ([place]) [id number]   . Hypercholesteremia [E78.00]   . CKD (chronic kidney disease), stage III [N18.3]   . Acute deep vein thrombosis (DVT) of femoral vein of right lower extremity (HCC) [I82.411]    Subjective Data: Elevated mood, hyperactivity, hallucinations, paranoia.  Continued Clinical Symptoms:  Alcohol Use Disorder Identification Test Final Score (AUDIT): 0 The "Alcohol Use Disorders Identification Test", Guidelines for Use in Primary Care, Second Edition.  World Pharmacologist [facility]). Score between 0-7:  no or low risk or alcohol related problems. Score between 8-15:  moderate risk of alcohol related problems. Score between 16-19:  high risk of alcohol related problems. Score 20 or above:  warrants further diagnostic evaluation for alcohol dependence and treatment.   CLINICAL FACTORS:   Bipolar Disorder:   Mixed State   Musculoskeletal: Strength & Muscle Tone: within normal limits Gait & Station: normal Patient leans: N/A  Psychiatric Specialty Exam: Physical Exam  Nursing note and vitals reviewed.   Review of Systems  Psychiatric/Behavioral: Positive for hallucinations. The patient has insomnia.   All other systems reviewed and are negative.   Blood pressure 111/85, pulse 105, temperature 98.6 F (37 C), temperature source Oral, resp. rate 16, height 6' (1.829 m),  weight 143.79 kg (317 lb), SpO2 96 %.Body mass index is 42.98 kg/(m^2).  General Appearance: Casual  Eye Contact:  Good  Speech:  Pressured  Volume:  Normal  Mood:  Euphoric  Affect:  Appropriate  Thought Process:  Disorganized  Orientation:  Full (Time, Place, and Person)  Thought Content:  Hallucinations: Visual  Suicidal Thoughts:  No  Homicidal Thoughts:  No  Memory:  Immediate;   Fair Recent;   Fair Remote;   Fair  Judgement:  Impaired  Insight:  Shallow  Psychomotor Activity:  Increased  Concentration:  Concentration: Fair  Recall:  [employer] of Knowledge:  Fair  Language:  Fair  Akathisia:  No  Handed:  Right  AIMS (if indicated):     Assets:  Communication Skills Desire for Improvement Financial Resources/Insurance Housing Intimacy Physical Health Resilience Social Support Transportation Vocational/Educational  ADL's:  Intact  Cognition:  WNL  Sleep:  Number of Hours: 5      COGNITIVE FEATURES THAT CONTRIBUTE TO RISK:  None    SUICIDE RISK:   Minimal: No identifiable suicidal ideation.  Patients presenting with no risk factors but with morbid ruminations; may be classified as minimal risk based on the severity of the depressive symptoms  PLAN OF CARE: Hospital admission, medication management, discharge planning.  Mr. Sorich is a 50 year old male with history of bipolar illness admitted in a manic, psychotic episode in the context of recent severe physical illness and unintentional medication changes.  1. Mood and psychosis. The patient was restarted on a combination of Abilify and Seroquel for psychosis and Tegretol and Lamictal for mood stabilization.  2. Insomnia. He was restarted  on Restoril 30  3. History of PE and DVT. He is on Lovenox and warfarin.  4. Diabetes. He is on weekly Tenzeum, Invokana, ADA diet, blood glucose monitoring and sliding scale insulin.  5. Dyslipidemia. He is on Lipitor and Fenofibrate.  6. Hypertension. He is on  Vasotec.  7. Metabolic syndrome screening. Lipid profile, TSH, hemoglobin A1c and prolactin are pending.  8. Disposition. He will be discharged to home with his wife. He will follow up with Dr. Thurmond Butts, his primary psychiatrist.      I certify that inpatient services furnished can reasonably be expected to improve the patient's condition.   Orson Slick, MD 12/12/2015, 2:13 PM

## 2015-12-12 NOTE — Tx Team (Signed)
Interdisciplinary Treatment Plan Update (Adult)  Date:  12/12/2015 Time Reviewed:  3:33 PM  Progress in Treatment: Attending groups: Yes. Participating in groups:  Yes. Taking medication as prescribed:  Yes. Tolerating medication:  Yes. Family/Significant othe contact made:  No, will contact:    Patient understands diagnosis:  Yes. Discussing patient identified problems/goals with staff:  Yes. Medical problems stabilized or resolved:  Yes. Denies suicidal/homicidal ideation: Yes. Issues/concerns per patient self-inventory:  Yes. Other:  New problem(s) identified: No, Describe:     Discharge Plan or [place] home with wife, follow up with Dr. Thurmond Butts  Reason for Continuation of Hospitalization: Delusions  Mania Other; describe insomnia  Comments:The patient has a long history of bipolar Disorder with multiple psychiatric hospitalizations he has been stable since 2009 on a combination of Abilify, Seroquel, Tegretol, Lamictal, and Restoril in the care of dr. Thurmond Butts, his primary psychiatrist. The patient has been compliant with medications and doctors appointments. On July 2 he was admitted to medical floor for DVT and pulmonary embolism. At that time some of his medications were discontinued or the doses were lowered. The patient started developing manic symptoms. He became insomniac, hyperactive, hyperverbal and possibly psychotic. He called exterminators to his house twice to deal with critters that he could see on the walls. The wife suggested that he comes to the hospital and the patient agreed. He is very proud of himself that he committed himself. He is very proud of the fact the years he developed good coping skills. This includes good treatment compliance as well as ability to listen and not to interrupt. He denies symptoms of anxiety. There are no alcohol or drugs involved.  Estimated length of stay: 7 days  New goal(s):  Review of initial/current patient goals per problem  list:   1.  Goal(s): Patient will participate in aftercare plan * Met: YES * Target date: at discharge * As evidenced by: Patient will participate within aftercare plan AEB aftercare provider and housing plan at discharge being identified. 7/11-Pt will follow up with Dr. Thurmond Butts and return home.  2.  Goal(s): Patient will demonstrate decreased signs and symptoms of Mania. * Met: NO * Target date: at discharge * As evidenced by: Patient will demonstrate reduced signs of mania and be deemed stable for discharge by MD  3.  Goal (s): Patient will demonstrate decreased symptoms of psychosis. * Met: No  *  Target date: at discharge As evidenced by: Patient will not endorse signs of psychosis or be deemed stable for discharge by MD.  Attendees: Patient:  Cameron [name] 7/11/20173:33 PM  Family:   7/11/20173:33 PM  Physician:  Orson Slick 7/11/20173:33 PM  Nursing:   Carolynn Sayers, RN 7/11/20173:33 PM  Case Manager:   7/11/20173:33 PM  Counselor:  Dossie Arbour, LCSW 7/11/20173:33 PM  Other:  Everitt Amber, LRT 7/11/20173:33 PM  Other:   7/11/20173:33 PM  Other:   7/11/20173:33 PM  Other:  7/11/20173:33 PM  Other:  7/11/20173:33 PM  Other:  7/11/20173:33 PM  Other:  7/11/20173:33 PM  Other:  7/11/20173:33 PM  Other:  7/11/20173:33 PM  Other:   7/11/20173:33 PM   Scribe for Treatment Team:   August Saucer, 12/12/2015, 3:33 PM, MSW, LCSW

## 2015-12-12 NOTE — BHH Group Notes (Signed)
[place] Group Notes:  (Nursing/MHT/Case Management/Adjunct)  Date:  12/12/2015  Time:  2:15 PM  Type of Therapy:  Psychoeducational Skills  Participation Level:  Active  Participation Quality:  Appropriate, Attentive, Sharing and Supportive  Affect:  Anxious and Appropriate  Cognitive:  Oriented  Insight:  Improving  Engagement in Group:  Engaged  Modes of Intervention:  Discussion and Education  Summary of Progress/Problems:  Charise Killian 12/12/2015, 2:15 PM

## 2015-12-12 NOTE — Plan of Care (Signed)
Problem: Safety: Goal: Periods of time without injury will increase Outcome: Progressing Patient has remained free from harm during this shift.

## 2015-12-12 NOTE — Progress Notes (Signed)
Visitation by wife and daughter Cameron [name] and Cameron [name]) went well, Cameron [name] was pleasant to introduce family; denied pain, denied SI/HI, denied AV/H. BIB wife after hospital stay on a Medical Unit for DVT & PE., he began to have Auditory Hallucinations of "Crickets on the wall."

## 2015-12-12 NOTE — BHH Group Notes (Signed)
[place] LCSW Group Therapy   12/12/2015 3pm  Type of Therapy: Group Therapy   Participation Level: Active   Participation Quality: Attentive, Sharing and Supportive   Affect: Appropriate  Cognitive: Alert and Oriented   Insight: Developing/Improving and Engaged   Engagement in Therapy: Developing/Improving and Engaged   Modes of Intervention: Clarification, Confrontation, Discussion, Education, Exploration,  Limit-setting, Orientation, Problem-solving, Rapport Building, Art therapist, Socialization and Support  Summary of Progress/Problems: The topic for group therapy was feelings about diagnosis. Pt actively participated in group discussion on their past and current diagnosis and how they feel towards this. Pt also identified how society and family members judge them, based on their diagnosis as well as stereotypes and stigmas. Pt shared pt was hospitalized at his choice in order to appease his wife due to his diagnosis.  Pt shared he wants to please his wife and as such is seeking treatment voluntarily.  Pt presented as manic and at times began to speak in a disorganized manner but was always polite and cooperative with the CSW and other group members and focused and attentive to the topics discussed and the sharing of others.     Cameron [name]. Hidaya Daniel, LCSWA, LCAS

## 2015-12-12 NOTE — Progress Notes (Signed)
ANTICOAGULATION CONSULT NOTE - Follow Up Consult  Pharmacy Consult for LMWH/VKA Indication: VTE treatment  No Known Allergies  Patient Measurements: Height: 6' (182.9 cm) Weight: (!) 317 lb (143.79 kg) IBW/kg (Calculated) : 77.6   Vital Signs: Temp: 98.6 F (37 C) (07/11 0700) Temp Source: Oral (07/11 0700) BP: 111/85 mmHg (07/11 0700) Pulse Rate: 105 (07/11 0700)  Labs:  Recent Labs  12/10/15 2225 12/12/15 0641  HGB 12.9*  --   HCT 36.9*  --   PLT 240  --   LABPROT 22.5* 30.4*  INR 1.99 2.97  CREATININE 1.60*  --     Estimated Creatinine Clearance: 80.4 mL/min (by C-G formula based on Cr of 1.6).   Medical History: Past Medical History  Diagnosis Date  . Bipolar 1 disorder ([place])   . Diabetes mellitus without complication ([place])   . Hypertension   . Hypercholesteremia   . CKD (chronic kidney disease), stage III   . DVT (deep venous thrombosis) (HCC)     Medications:  Warfarin 12.5 mg po daily Lovenox 145 mg subq q12h  Assessment: 34 yom admitted to ED BHU with manic symptoms. Recently discharged from [facility] with DVT/PE, had been bridging LMWH and VKA.  INR History: 7/9: INR - 1.99 7/10: no INR- warfarin 12.5 mg po daily at 0200 7/11: INR - 2.97  Goal of Therapy:  INR 2-3 Monitor platelets by anticoagulation protocol: Yes   Plan:  Continue Lovenox 145 mg subcutaneously BID until INR > 2 for 2 days.  INR increased quickly from 1.99>>2.97.  Will hold today's dose of warfarin as concern that INR will continue to rise.  Will recheck INR in AM and restart lower dose of warfarin 10 mg if INR <3.   Pharmacy will continue to follow.   Primitivo Merkey G, Pharm.D., BCPS Clinical Pharmacist 12/12/2015,9:01 AM

## 2015-12-12 NOTE — Progress Notes (Signed)
Recreation Therapy Notes  Date: 07.11.17 Time: 9:30 am Location: Craft Room  Group Topic: Self-expression  Goal Area(s) Addresses:  Patient will identify one color per emotion listed on wheel. Patient will verbalize one emotion experienced during session. Patient will be educated on other forms of self-expression.  Behavioral Response: Attentive, Interactive, Off Topic  Intervention: Emotion Wheel  Activity: Patients were given an Emotion Wheel worksheet and instructed to pick a color for each emotion listed on the wheel.  Education: LRT educated patients on different forms of self-expression.  Education Outcome: Acknowledges education/In group clarification offered  Clinical Observations/Feedback: Patient completed activity by picking a color for each emotion. Patient contributed to group discussion by stating what colors he picked for certain emotions, and what emotion he felt while he was coloring. Patient got off topic and talked about going to the store and googling about a Higher Power.  [name], LRT/CTRS 12/12/2015 10:24 AM

## 2015-12-12 NOTE — BHH Suicide Risk Assessment (Deleted)
[place] INPATIENT:  Family/Significant Other Suicide Prevention Education  Suicide Prevention Education:  Education Completed; Nathaneil Canary and Aline August,  (name of family member/significant other) has been identified by the patient as the family member/significant other with whom the patient will be residing, and identified as the person(s) who will aid the patient in the event of a mental health crisis (suicidal ideations/suicide attempt).  With written consent from the patient, the family member/significant other has been provided the following suicide prevention education, prior to the and/or following the discharge of the patient.  The suicide prevention education provided includes the following:  Suicide risk factors  Suicide prevention and interventions  National Suicide Hotline telephone number  [facility] assessment telephone number  [facility] Emergency Assistance [place] and/or Residential Mobile Crisis Unit telephone number  Request made of family/significant other to:  Remove weapons (e.g., guns, rifles, knives), all items previously/currently identified as safety concern.    Remove drugs/medications (over-the-counter, prescriptions, illicit drugs), all items previously/currently identified as a safety concern.  The family member/significant other verbalizes understanding of the suicide prevention education information provided.  The family member/significant other agrees to remove the items of safety concern listed above.  August Saucer, MSW, LCSW 12/12/2015, 3:36 PM

## 2015-12-12 NOTE — Progress Notes (Signed)
D: Patient stated slept good last night .Stated appetitegoodand energy level  low. Stated concentration is good . Stated on Depression scale0 , hopeless 0 and anxiety0.( low 0-10 high) Denies suicidal  homicidal ideations  .  No auditory hallucinations  No pain concerns . Appropriate ADL'S. Interacting with peers and staff. Patient concerns a bout a weeping wound . Cellulitis on both legs  With edema.   A: Encourage patient participation with unit programming . Instruction  Given on  Medication , verbalize understanding. Assisted with bed change .  R: Voice no other concerns. Staff continue to monitor

## 2015-12-12 NOTE — BHH Group Notes (Signed)
Goals Group Date/Time: 12/12/15 9am Type of Therapy and Topic: Group Therapy: Goals Group: SMART Goals   Participation Level: Moderate  Description of Group:    The purpose of a daily goals group is to assist and guide patients in setting recovery/wellness-related goals. The objective is to set goals as they relate to the crisis in which they were admitted. Patients will be using SMART goal modalities to set measurable goals. Characteristics of realistic goals will be discussed and patients will be assisted in setting and processing how one will reach their goal. Facilitator will also assist patients in applying interventions and coping skills learned in psycho-education groups to the SMART goal and process how one will achieve defined goal.   Therapeutic Goals:   -Patients will develop and document one goal related to or their crisis in which brought them into treatment.  -Patients will be guided by LCSW using SMART goal setting modality in how to set a measurable, attainable, realistic and time sensitive goal.  -Patients will process barriers in reaching goal.  -Patients will process interventions in how to overcome and successful in reaching goal.   Patient's Goal: Pt did not share extensively, but did report he used to work at [employer] presented as bright, energetic and happy and was focused and attentive to the sharing of others.     Therapeutic Modalities:  Motivational Interviewing  Art gallery manager  SMART goals setting   [name]. Orvile Corona, LCSWA, LCAS

## 2015-12-12 NOTE — Progress Notes (Signed)
D: Patient is very hyperverbal. Met with wife during visitation and she communicated when he was to receive his Invokana and Albiglutide. This was communicated to pharmacy and MD on call. Medication was sent to pharmacy. Patient has a swollen red area on R leg that appears to be cellulitis. Area is shiny and weeping. There is +2 edema in the L leg pitting and +3 pitting edema and in the R leg.  A: Medication was given with education. Encouragement was provided.  R: Patient was compliant with medication. He has been calm and cooperative. Safety maintained with 15 min checks.

## 2015-12-12 NOTE — H&P (Addendum)
Psychiatric Admission Assessment Adult  Patient Identification: Cameron [name] MRN:  [id number] Date of Evaluation:  12/12/2015 Chief Complaint:  Bipolar 1 Disorder Principal Diagnosis: Bipolar I disorder, current or most recent episode manic, with psychotic features [facility]) Diagnosis:   Patient Active Problem List   Diagnosis Date Noted  . Bipolar I disorder, current or most recent episode manic, with psychotic features ([place]) [F31.2] 12/11/2015  . Pulmonary embolism ([place]) [I26.99] 12/04/2015  . Diabetes mellitus without complication ([place]) [id number]   . Hypercholesteremia [E78.00]   . CKD (chronic kidney disease), stage III [N18.3]   . Acute deep vein thrombosis (DVT) of femoral vein of right lower extremity (HCC) [I82.411]    History of Present Illness:   Identifying data. Cameron [name] is a 50 year old male with a history of bipolar disorder.  Chief complaint. "I committed myself."  History of present illness. Information was obtained from the patient and the chart. The patient has a long history of bipolar disorder with multiple psychiatric hospitalizations. He has been stable since 2009 on a combination of Abilify, Seroquel, Tegretol, Lamictal, and Restoril in the care of Dr. Thurmond Butts, his primary psychiatrist. The patient has been compliant with medications and doctors appointments. On July 2 he was admitted to medical floor for DVT and pulmonary embolism. At that time some of his medications were discontinued or the doses were lowered. The patient started developing manic symptoms. He became insomniac, hyperactive, hyperverbal and grandiose. The wife suggested that he comes to the hospital and the patient agreed. He is very proud of himself that he "committed himself". He is very proud of the fact that over the years he developed good coping skills. This includes good treatment compliance as well as ability to listen and not to interrupt. He denies symptoms of anxiety. There are no alcohol or  drugs involved.  Past psychiatric history. Long history of bipolar mania. He used to be hospitalized every 2 years for severe manic episode that it will require extended hospitalizations and ECT treatments. His last episode was in 2009. She received ECT treatment according to the patient flipped him to depression.   Family psychiatric history. Daughter with bipolar and ADHD.  Social history. He experienced several stressors in the pas ttwo years, sickness of his mother and job loss, that did not result in exacerbation of his illness. Recent hospital admission were some omissions in his medication regimen were made brought him to the hospital again. 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  Total Time spent with patient: 1 hour  Is the patient at risk to self? No.  Has the patient been a risk to self in the past 6 months? No.  Has the patient been a risk to self within the distant past? No.  Is the patient a risk to others? No.  Has the patient been a risk to others in the past 6 months? No.  Has the patient been a risk to others within the distant past? No.   Prior Inpatient Therapy:   Prior Outpatient Therapy:    Alcohol Screening: 1. How often do you have a drink containing alcohol?: Never 9. Have you or someone else been injured as a result of your drinking?: No 10. Has a relative or friend or a doctor or another health worker been concerned about your drinking or suggested you cut down?: No Alcohol Use Disorder Identification Test Final Score (AUDIT): 0 Brief Intervention: AUDIT score less than 7 or less-screening does not  suggest unhealthy drinking-brief intervention not indicated Substance Abuse History in the last 12 months:  No. Consequences of Substance Abuse: NA Previous Psychotropic Medications: Yes  Psychological Evaluations: No  Past Medical History:  Past Medical History  Diagnosis Date  . Bipolar 1 disorder ([place])   . Diabetes mellitus  without complication ([place])   . Hypertension   . Hypercholesteremia   . CKD (chronic kidney disease), stage III   . DVT (deep venous thrombosis) [facility])     Past Surgical History  Procedure Laterality Date  . Appendectomy     Family History:  Family History  Problem Relation Age of Onset  . Stroke Other   . Diabetes Other    Tobacco Screening: @FLOW ([phone])::1)@ Social History:  History  Alcohol Use  . Yes    Comment: rarely     History  Drug Use No    Additional Social History:                           Allergies:  No Known Allergies Lab Results:  Results for orders placed or performed during the hospital encounter of 12/11/15 (from the past 48 hour(s))  Glucose, capillary     Status: Abnormal   Collection Time: 12/11/15  5:03 PM  Result Value Ref Range   Glucose-Capillary 124 (H) 65 - 99 mg/dL   Comment 1 Notify RN   Lipid panel, fasting     Status: Abnormal   Collection Time: 12/11/15  5:12 PM  Result Value Ref Range   Cholesterol 143 0 - 200 mg/dL   Triglycerides 286 (H) <150 mg/dL   HDL 32 (L) >40 mg/dL   Total CHOL/HDL Ratio 4.5 RATIO   VLDL 57 (H) 0 - 40 mg/dL   LDL Cholesterol 54 0 - 99 mg/dL    Comment:        Total Cholesterol/HDL:CHD Risk Coronary Heart Disease Risk Table                     Men   Women  1/2 Average Risk   3.4   3.3  Average Risk       5.0   4.4  2 X Average Risk   9.6   7.1  3 X Average Risk  23.4   11.0        Use the calculated Patient Ratio above and the CHD Risk Table to determine the patient's CHD Risk.        ATP III CLASSIFICATION (LDL):  <100     mg/dL   Optimal  100-129  mg/dL   Near or Above                    Optimal  130-159  mg/dL   Borderline  160-189  mg/dL   High  >190     mg/dL   Very High   Prolactin     Status: None   Collection Time: 12/11/15  5:12 PM  Result Value Ref Range   Prolactin 4.4 4.0 - 15.2 ng/mL    Comment: (NOTE) Performed At: [facility] [place], [place] [id number] Lindon Romp MD [id number]   TSH     Status: None   Collection Time: 12/11/15  5:12 PM  Result Value Ref Range   TSH 0.405 0.350 - 4.500 uIU/mL  Glucose, capillary     Status: Abnormal   Collection Time: 12/11/15  8:57 PM  Result  Value Ref Range   Glucose-Capillary 127 (H) 65 - 99 mg/dL  Glucose, capillary     Status: Abnormal   Collection Time: 12/12/15  6:33 AM  Result Value Ref Range   Glucose-Capillary 141 (H) 65 - 99 mg/dL  Protime-INR     Status: Abnormal   Collection Time: 12/12/15  6:41 AM  Result Value Ref Range   Prothrombin Time 30.4 (H) 11.4 - 15.0 seconds   INR 2.97   Glucose, capillary     Status: Abnormal   Collection Time: 12/12/15 11:23 AM  Result Value Ref Range   Glucose-Capillary 131 (H) 65 - 99 mg/dL    Blood Alcohol level:  Lab Results  Component Value Date   ETH <5 [id number]    Metabolic Disorder Labs:  Lab Results  Component Value Date   HGBA1C 8.3* 12/04/2015   MPG 192 12/04/2015   MPG 318* 08/24/2010   Lab Results  Component Value Date   PROLACTIN 4.4 12/11/2015   Lab Results  Component Value Date   CHOL 143 12/11/2015   TRIG 286* 12/11/2015   HDL 32* 12/11/2015   CHOLHDL 4.5 12/11/2015   VLDL 57* 12/11/2015   LDLCALC 54 12/11/2015   LDLCALC 56 12/05/2015    Current Medications: Current Facility-Administered Medications  Medication Dose Route Frequency Provider Last Rate Last Dose  . acetaminophen (TYLENOL) tablet 650 mg  650 mg Oral Q6H PRN Hildred Priest, MD      . Albiglutide PEN 50 mg  50 mg Subcutaneous Q Mon Clovis Fredrickson, MD   50 mg at 12/11/15 2218  . alum & mag hydroxide-simeth (MAALOX/MYLANTA) 200-200-20 MG/5ML suspension 30 mL  30 mL Oral Q4H PRN Hildred Priest, MD      . ARIPiprazole (ABILIFY) tablet 30 mg  30 mg Oral QPM Hildred Priest, MD   30 mg at 12/11/15 1759  . atorvastatin (LIPITOR) tablet 40 mg  40 mg Oral Daily Hildred Priest, MD   40 mg at 12/12/15 0842  . canagliflozin (INVOKANA) tablet 100 mg  100 mg Oral BH-q7a Clovis Fredrickson, MD   100 mg at 12/12/15 0642  . carbamazepine (TEGRETOL XR) 12 hr tablet 600 mg  600 mg Oral QHS Gifford Ballon B Simra Fiebig, MD   600 mg at 12/11/15 2221  . carbamazepine (TEGRETOL) tablet 400 mg  400 mg Oral Q breakfast Hildred Priest, MD   400 mg at 12/12/15 0835  . enalapril (VASOTEC) tablet 5 mg  5 mg Oral BID Hildred Priest, MD   5 mg at 12/12/15 0843  . enoxaparin (LOVENOX) injection 145 mg  1 mg/kg Subcutaneous BID Hildred Priest, MD   145 mg at 12/12/15 0842  . fenofibrate tablet 160 mg  160 mg Oral Daily Hildred Priest, MD   160 mg at 12/12/15 0844  . insulin aspart (novoLOG) injection 0-5 Units  0-5 Units Subcutaneous QHS Hildred Priest, MD   0 Units at 12/11/15 2241  . insulin aspart (novoLOG) injection 0-9 Units  0-9 Units Subcutaneous TID WC Hildred Priest, MD   1 Units at 12/12/15 1200  . insulin glargine (LANTUS) injection 60 Units  60 Units Subcutaneous Daily Hildred Priest, MD   60 Units at 12/11/15 1800  . lamoTRIgine (LAMICTAL) tablet 400 mg  400 mg Oral QHS Hildred Priest, MD   400 mg at 12/11/15 2220  . magnesium hydroxide (MILK OF MAGNESIA) suspension 30 mL  30 mL Oral Daily PRN Hildred Priest, MD      . QUEtiapine (SEROQUEL) tablet 800  mg  800 mg Oral QHS Hildred Priest, MD   800 mg at 12/11/15 2220  . temazepam (RESTORIL) capsule 30 mg  30 mg Oral QHS Hildred Priest, MD   30 mg at 12/11/15 2219  . Warfarin - Pharmacist Dosing Inpatient   Does not apply q1800 Hildred Priest, MD       PTA Medications: Prescriptions prior to admission  Medication Sig Dispense Refill Last Dose  . ARIPiprazole (ABILIFY) 30 MG tablet Take 30 mg by mouth every evening.    unknown  . atorvastatin (LIPITOR) 40 MG tablet Take 40 mg by mouth  daily.  11 unknown  . Canagliflozin (INVOKANA) 100 MG TABS Take 100 mg by mouth daily with breakfast.    unknown  . carbamazepine (TEGRETOL) 200 MG tablet Take 400-600 mg by mouth 2 (two) times daily. Take 2 tablets in the morning Take 3 tablets in the evening   unknown  . enalapril (VASOTEC) 5 MG tablet Take 5 mg by mouth 2 (two) times daily.   unknown  . enoxaparin (LOVENOX) 150 MG/ML injection Inject 1 mL (150 mg total) into the skin every 12 (twelve) hours. 8 Syringe 0 unknown  . fenofibrate 160 MG tablet Take 160 mg by mouth daily.   unknown  . Insulin Glargine (LANTUS SOLOSTAR) 100 UNIT/ML Solostar Pen Inject 60 Units into the skin daily.    unknown  . lamoTRIgine (LAMICTAL) 100 MG tablet Take 400 mg by mouth at bedtime.   unknown  . Multiple Vitamin (MULTIVITAMIN WITH MINERALS) TABS tablet Take 1 tablet by mouth daily.   unknown  . QUEtiapine (SEROQUEL) 400 MG tablet Take 800 mg by mouth at bedtime.   unknown  . TANZEUM 50 MG PEN Inject 50 mg into the skin every 7 (seven) days.  2 unknown  . temazepam (RESTORIL) 15 MG capsule Take 15 mg by mouth at bedtime as needed for sleep.   unknown  . warfarin (COUMADIN) 5 MG tablet Take 2 tablets (10 mg total) by mouth daily. 60 tablet 0 unknown    Musculoskeletal: Strength & Muscle Tone: within normal limits Gait & Station: normal Patient leans: N/A  Psychiatric Specialty Exam: Physical Exam  Nursing note and vitals reviewed. Constitutional: He is oriented to person, place, and time. He appears well-developed and well-nourished.  HENT:  Head: Normocephalic and atraumatic.  Eyes: Conjunctivae and EOM are normal. Pupils are equal, round, and reactive to light.  Neck: Normal range of motion. Neck supple.  Cardiovascular: Normal rate, regular rhythm and normal heart sounds.   Respiratory: Effort normal and breath sounds normal.  GI: Soft. Bowel sounds are normal.  Musculoskeletal: Normal range of motion.  Neurological: He is alert and  oriented to person, place, and time. He has normal reflexes.  Skin: Skin is warm and dry.    Review of Systems  Psychiatric/Behavioral: Positive for hallucinations. The patient has insomnia.   All other systems reviewed and are negative.   Blood pressure 111/85, pulse 105, temperature 98.6 F (37 C), temperature source Oral, resp. rate 16, height 6' (1.829 m), weight 143.79 kg (317 lb), SpO2 96 %.Body mass index is 42.98 kg/(m^2).  See SRA.                                                  Sleep:  Number of Hours: 5  Treatment Plan Summary: Daily contact with patient to assess and evaluate symptoms and progress in treatment and Medication management   Mr. Carton is a 50 year old male with history of bipolar illness admitted in a manic, psychotic episode in the context of recent severe physical illness and unintentional medication changes.  1. Mood and psychosis. The patient was restarted on a combination of Abilify and Seroquel for psychosis and Tegretol and Lamictal for mood stabilization.  2. Insomnia. He was restarted on Restoril 30  3. History of PE and DVT. He is on Lovenox and warfarin.  4. Diabetes. He is on weekly Tenzeum, Invokana, ADA diet, blood glucose monitoring and sliding scale insulin.  5. Dyslipidemia. He is on Lipitor and Fenofibrate.  6. Hypertension. He is on Vasotec.  7. Metabolic syndrome screening. Lipid profile, TSH, hemoglobin A1c and prolactin are pending.  8. Disposition. He will be discharged to home with his wife. He will follow up with Dr. Thurmond Butts, his primary psychiatrist.   Observation Level/Precautions:  15 minute checks  Laboratory:  CBC Chemistry Profile UDS UA  Psychotherapy:    Medications:    Consultations:    Discharge Concerns:    Estimated LOS:  Other:     I certify that inpatient services furnished can reasonably be expected to improve the patient's condition.    Orson Slick,  MD 7/11/20172:26 PM

## 2015-12-12 NOTE — Plan of Care (Signed)
Problem: Coping: Goal: Ability to identify and develop effective coping behavior will improve Outcome: Progressing Patient attending  Unit programing  Working on coping skills.     

## 2015-12-13 LAB — GLUCOSE, CAPILLARY
GLUCOSE-CAPILLARY: 80 mg/dL (ref 65–99)
GLUCOSE-CAPILLARY: 134 mg/dL — AB (ref 65–99)
GLUCOSE-CAPILLARY: 96 mg/dL (ref 65–99)
GLUCOSE-CAPILLARY: 80 mg/dL (ref 65–99)

## 2015-12-13 LAB — CBC
HCT: 37 % — ABNORMAL LOW (ref 40.0–52.0)
HEMOGLOBIN: 12.6 g/dL — AB (ref 13.0–18.0)
MCH: 29.3 pg (ref 26.0–34.0)
MCHC: 34.1 g/dL (ref 32.0–36.0)
MCV: 85.9 fL (ref 80.0–100.0)
PLATELETS: 231 10*3/uL (ref 150–440)
RBC: 4.31 MIL/uL — AB (ref 4.40–5.90)
RDW: 12.9 % (ref 11.5–14.5)
WBC: 4 10*3/uL (ref 3.8–10.6)

## 2015-12-13 LAB — PROTIME-INR
INR: 1.92
PROTHROMBIN TIME: 21.9 s — AB (ref 11.4–15.0)

## 2015-12-13 MED ORDER — TEMAZEPAM 15 MG PO CAPS
60.0000 mg | ORAL_CAPSULE | Freq: Every day | ORAL | Status: DC
  Administered 2015-12-13 – 2015-12-21 (×8): 60 mg via ORAL
  Filled 2015-12-13 (×10): qty 4

## 2015-12-13 MED ORDER — WARFARIN SODIUM 4 MG PO TABS
10.0000 mg | ORAL_TABLET | Freq: Every day | ORAL | Status: DC
  Administered 2015-12-13 – 2015-12-14 (×2): 10 mg via ORAL
  Filled 2015-12-13 (×2): qty 2

## 2015-12-13 NOTE — [facility] Suicide Risk Assessment (Addendum)
[facility] INPATIENT:  Family/Significant Other Suicide Prevention Education  Suicide Prevention Education:  Education Completed; with pt's wife Cameron [name] at ph:[phone] option 4, Ext [phone] ,  (name of family member/significant other) has been identified by the patient as the family member/significant other with whom the patient will be residing, and identified as the person(s) who will aid the patient in the event of a mental health crisis (suicidal ideations/suicide attempt).  With written consent from the patient, the family member/significant other has been provided the following suicide prevention education, prior to the and/or following the discharge of the patient.  CSW completed SPE with pt.  The suicide prevention education provided includes the following:  Suicide risk factors  Suicide prevention and interventions  National Suicide Hotline telephone number  [facility] assessment telephone number  [facility] Emergency Assistance [place] and/or Residential Mobile Crisis Unit telephone number  Request made of family/significant other to:  Remove weapons (e.g., guns, rifles, knives), all items previously/currently identified as safety concern.    Remove drugs/medications (over-the-counter, prescriptions, illicit drugs), all items previously/currently identified as a safety concern.  The family member/significant other verbalizes understanding of the suicide prevention education information provided.  The family member/significant other agrees to remove the items of safety concern listed above.  Cameron [name] 12/19/2015, 1:52 PM

## 2015-12-13 NOTE — Progress Notes (Signed)
Recreation Therapy Notes  Date: 07.12.17 Time: 9:30 am Location: Craft Room  Group Topic: Self-esteem  Goal Area(s) Addresses:  Patient will write at least one positive trait about self. Patient will verbalize benefit of having healthy self-esteem.  Behavioral Response: Attentive, Interactive, Off topic  Intervention: I Am  Activity: Patients were given a worksheet with the letter I on it and instructed to write as many positive traits about themselves inside the letter.  Education: LRT educated patients on ways they can increase their self-esteem.  Education Outcome: In group clarification offered  Clinical Observations/Feedback: Patient wrote one positive trait. Patient contributed to group discussion by stating why he wrote what he did, but patient got off topic while doing so.  [name], LRT/CTRS 12/13/2015 10:13 AM

## 2015-12-13 NOTE — Progress Notes (Signed)
   12/13/15 0435  Psych Admission Type (Psych Patients Only)  Admission Status Voluntary  Psychosocial Assessment  Patient Complaints Crying spells;Irritability;Insomnia;Loneliness;Sadness;[place];Intense  Facial Expression Animated;Sad;Worried  Affect Depressed;Inconsistent with thought content;Preoccupied;Sad;Constricted  Speech Soft  Interaction Attention-seeking;Demanding;Manipulative  Motor Activity Pacing;Restless  Appearance/Hygiene Disheveled  Behavior Characteristics Irritable;Pacing  Mood Depressed;Irritable;Preoccupied;Sad  Thought Process  Coherency Disorganized;Flight of ideas;Loose associations;Tangential  Content Blaming others;Preoccupation;Religiosity  Delusions Religious;Somatic  Perception Derealization  Hallucination None reported or observed  Judgment Impaired  Confusion None  Danger to Self  Current suicidal ideation? Denies  Danger to Others  Danger to Others None reported or observed
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
## 2015-12-13 NOTE — Progress Notes (Signed)
ANTICOAGULATION CONSULT NOTE - Follow Up Consult  Pharmacy Consult for LMWH/VKA Indication: VTE treatment  No Known Allergies  Patient Measurements: Height: 6' (182.9 cm) Weight: (!) 317 lb (143.79 kg) IBW/kg (Calculated) : 77.6   Vital Signs: Temp: 98.6 F (37 C) (07/12 0651) Temp Source: Oral (07/12 0651) BP: 116/64 mmHg (07/12 0651) Pulse Rate: 91 (07/12 0651)  Labs:  Recent Labs  12/10/15 2225 12/12/15 0641 12/13/15 0616  HGB 12.9*  --  12.6*  HCT 36.9*  --  37.0*  PLT 240  --  231  LABPROT 22.5* 30.4* 21.9*  INR 1.99 2.97 1.92  CREATININE 1.60*  --   --     Estimated Creatinine Clearance: 80.4 mL/min (by C-G formula based on Cr of 1.6).   Medical History: Past Medical History  Diagnosis Date  . Bipolar 1 disorder ([place])   . Diabetes mellitus without complication ([place])   . Hypertension   . Hypercholesteremia   . CKD (chronic kidney disease), stage III   . DVT (deep venous thrombosis) (HCC)     Medications:  Warfarin 12.5 mg po daily Lovenox 145 mg subq q12h  Assessment: 71 yom admitted to ED B[facility] with manic symptoms. Recently discharged from [facility] with DVT/PE, had been bridging LMWH and VKA.  INR History: 7/9: INR - 1.99 7/10: no INR- warfarin 12.5 mg po daily at 0200 7/11: INR - 2.97- warfarin held 7/12: INR - 1.92  Goal of Therapy:  INR 2-3 Monitor platelets by anticoagulation protocol: Yes   Plan:  Continue Lovenox 145 mg subcutaneously BID until INR > 2 for 2 days.  Will resume warfarin at 10 mg daily after being held yesterday due to rapidly rising INR.   Pharmacy will continue to follow.   Ulice Dash D, Pharm.D., BCPS Clinical Pharmacist 12/13/2015,9:42 AM

## 2015-12-13 NOTE — Plan of Care (Signed)
Problem: Coping: Goal: Ability to verbalize frustrations and anger appropriately will improve Outcome: Not Progressing Patient cries if he is not been attended to; patient is hyper verbal and hypomanic with crying spells; easily frustrated with angry affect, "curses" when agitated.
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
## 2015-12-13 NOTE — Progress Notes (Signed)
Patient is pleasant, cooperative and hyperverbal.Active in the milieu.Denies suicidal or homicidal ideations and AV hallucinations.Attended groups.Compliant with medications.Appetite & energy level good.

## 2015-12-13 NOTE — Progress Notes (Signed)
Hypomanic, excessive talking with FOI, Loose association ([place]), attention seeking, "Sorry, I know that you are busy, just one more thing, can I show you my glasses container, ... Just one more thing, I googled to come up with 100 pages "where does Jesus live?" He lives in all heart. Don't procrastinate, I procrastinated with my right knee, I got twisted, I played football to impress my father and grandfather. Romans Oceanographer killed each other for sports, just for sport .Marland Kitchen...they have aqui-ducts to the cities; like the [place], the grand [place] ... 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

## 2015-12-13 NOTE — BHH Group Notes (Signed)
[facility] LCSW Group Therapy   12/13/2015  1pm   Type of Therapy: Group Therapy   Participation Level: Active   Participation Quality: Attentive, Sharing and Supportive   Affect: Depressed and Flat   Cognitive: Alert and Oriented   Insight: Developing/Improving and Engaged   Engagement in Therapy: Developing/Improving and Engaged   Modes of Intervention: Clarification, Confrontation, Discussion, Education, Exploration, Limit-setting, Orientation, Problem-solving, Rapport Building, Art therapist, Socialization and Support   Summary of Progress/Problems: The topic for group today was emotional regulation. This group focused on both positive and negative emotion identification and allowed  group members to process ways to identify feelings, regulate negative emotions, and find healthy ways to manage internal/external emotions. Group members were asked to reflect on a time when their reaction to an emotion led to a negative outcome and explored how alternative responses using emotion regulation would have benefited them. Group members were also asked to discuss a time when emotion regulation was utilized when a negative emotion was experienced. Pt shared at length but had to be redirected multiple times.  Pt presented as manic and with thoughts that were disorganized and disconnected.  Pt was polite and cooperative with the CSW and other group members and focused and attentive to the topics discussed and the sharing of others.     Cameron [name]. Cameron [name], MSW, LCSWA, LCAS

## 2015-12-13 NOTE — Consult Note (Signed)
  ECT: Consult was requested by Dr. Bary Leriche to evaluate patient for ECT treatment. Patient interviewed. Chart reviewed. Case discussed with primary psychiatrist and nursing staff. I also had a conversation by telephone with Dr. Thurmond Butts who is the patient's primary outpatient psychiatrist and has treated him for many years.  Patient with bipolar disorder currently exhibiting symptoms of mania. Current episode possibly triggered by an interruption of his medication regimen during an unrelated hospital stay. Current symptoms include racing thoughts, euphoric as well as labile mood and affect, disorganized thinking with flight of ideas, grandiosity and hyperreligiosity. Patient is hyperverbal and agitated although at this point not physically aggressive or threatening. He is currently being treated with a combination of medicines that have been effective for him for years in controlling his bipolar disorder.  Patient has a past history of ECT being used to treat his bipolar disorder. He has had ECT in the past for treatment of manic episodes with good response. Treatments in the past to been well-tolerated although there apparently was one episode in the past in which ECT may have brought on some degree of depression after a mania. Nevertheless it appears clear that ECT has been very effective. He is outpatient psychiatrist, Dr. Thurmond Butts, believes that without ECT it will be extremely difficult to control this manic episode.  Patient was cooperative with the interview but was unable to engage in rational conversation. His thoughts are extremely disorganized. He exhibited constant flight of ideas. He was unable to answer even basic questions without coming tangential to the point of complete disorganization. Most of his speech is grandiose and hyper religious. His affect is mostly euphoric but he has moments of interruption where he appears to become overwhelmed and start to cry.  Based on his diagnosis and his  history I strongly suggested to the patient that we resume ECT treatment. I proposed starting ECT treatment at our next session on Friday morning. I told him that Dr. Thurmond Butts was in favor of this and that the nurses on the unit who knew him were also in favor. Unfortunately, every time I asked him whether he would agree to ECT treatment the patient very definitively shook his head and said "no no no". When I asked him to explain to me what his objections were he immediately became completely disorganized and was unable to give even a slightly rational reason for refusing the treatment.  Unfortunately, I think that this patient has crossed over from hypomania into full-blown mania. Based on my conversation with him today I can be sure whether he even has capacity to consider the treatment. Forcing treatment would be extremely difficult even if we did believe that he lacked capacity or had temporary guardianship.  Dr. Thurmond Butts has told me that he will come by the unit and try to talk with the patient in the next day. I will also come by again tomorrow and try to follow-up with the patient. I would be very happy to pursue ECT treatment at the earliest opportunity if it becomes possible.

## 2015-12-13 NOTE — Tx Team (Signed)
Interdisciplinary Treatment Plan Update (Adult)        Date: 12/13/2015   Time Reviewed: 9:30 AM   Progress in Treatment: Improving  Attending groups: Continuing to assess, patient new to milieu  Participating in groups: Continuing to assess, patient new to milieu  Taking medication as prescribed: Yes  Tolerating medication: Yes  Family/Significant other contact made: No, CSW assessing for appropriate contacts  Patient understands diagnosis: Yes  Discussing patient identified problems/goals with staff: Yes  Medical problems stabilized or resolved: Yes  Denies suicidal/homicidal ideation: Yes  Issues/concerns per patient self-inventory: Yes  Other:   New problem(s) identified: N/A   Discharge Plan or Barriers: CSW continuing to assess, patient new to milieu.   Reason for Continuation of Hospitalization:   Depression   Anxiety   Medication Stabilization   Comments: N/A   Estimated length of stay: 3-5 days     Patient is a 50 year old male with a history of bipolar disorder.  Pt's hief complaint. "I committed myself."   History of present illness. Information was obtained from the patient and the chart. The patient has a long history of bipolar Disorder with multiple psychiatric hospitalizations he has been stable since 2009 on a combination of Abilify, Seroquel, Tegretol, Lamictal, and Restoril in the care of dr. Thurmond Butts, his primary psychiatrist. The patient has been compliant with medications and doctors appointments. On July 2 he was admitted to medical floor for DVT and pulmonary embolism. At that time some of his medications were discontinued or the doses were lowered. The patient started developing manic symptoms. He became insomniac, hyperactive, hyperverbal and possibly psychotic. He called exterminators to his house twice to deal with critters that he could see on the walls. The wife suggested that he comes to the hospital and the patient agreed. He is very proud of himself  that he committed himself. He is very proud of the fact the years he developed good coping skills. This includes good treatment compliance as well as ability to listen and not to interrupt. He denies symptoms of anxiety. There are no alcohol or drugs involved.   Past psychiatric history. Long history of bipolar mania. He used to be hospitalized every 2 years for severe manic episode that it will require extended hospitalizations and ECT treatments. His last episode was in 2009. She received ECT treatment according to the patient flipped him to depression.    Family psychiatric history. Daughter with bipolar and ADHD.   Social history. He experienced several stressors in the pas ttwo years, sickness of his mother and job loss, that did not result in exacerbation of his illness. Recent hospital admission were some omissions in his medication regimen were made brought him to the hospital again. 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 . Patient lives in [place]. Patient will benefit from crisis stabilization, medication evaluation, group therapy, and psycho education in addition to case management for discharge planning. Patient and CSW reviewed pt's identified goals and treatment plan. Pt verbalized understanding and agreed to treatment plan.    Review of initial/current patient goals per problem list:  1. Goal(s): Patient will participate in aftercare plan   Met: No Target date: 3-5 days post admission date   As evidenced by: Patient will participate within aftercare plan AEB aftercare provider and housing plan at discharge being identified.   7/12: CSW assessing for appropriate contacts      2. . Goal(s): Patient will demonstrate decreased signs of psychosis  *  Met: No * Target date: 3-5 days post admission date  * As evidenced by: Patient will demonstrate decreased frequency of AVH or return to baseline function   7/12: Goal progressing.   3. Goal (s):  Patient will demonstrate decreased signs of mania  * Met: No * Target date: 3-5 days post admission date  * As evidenced by: Patient demonstrate decreased signs of mania AEB decreased mood instability and demonstration of stable mood   7/12: Goal progressing. Attendees:  Patient:  Family:  Physician: Dr. Bary Leriche, MD    12/13/2015 9:30 AM  Nursing: Polly Cobia, RN     12/13/2015 9:30 AM  Clinical Social Worker: Marylou Flesher, [facility]  12/13/2015 9:30 AM  Clinical Social Worker: Bonnye Fava, [place]  12/13/2015 9:30 AM  Other: , NP       12/13/2015 9:30 AM  Other:        12/13/2015 9:30 AM  Other:        12/13/2015 9:30 AM

## 2015-12-13 NOTE — BHH Group Notes (Signed)
[facility] Group Notes:  (Nursing/MHT/Case Management/Adjunct)  Date:  12/13/2015  Time:  6:20 PM  Type of Therapy:  Psychoeducational Skills  Participation Level:  Active  Participation Quality:  Appropriate, Attentive and Sharing  Affect:  Appropriate and Not Congruent  Cognitive:  Appropriate and Lacking  Insight:  Lacking  Engagement in Group:  Engaged and Off Topic  Modes of Intervention:  Discussion, Education and Support  Summary of Progress/Problems:  Cameron [name] [facility] 12/13/2015, 6:20 PM

## 2015-12-13 NOTE — Progress Notes (Signed)
[facility] MD Progress Note  12/13/2015 1:06 PM Cameron [name]  MRN:  [id number]  Subjective:  Mr. Cameron [name] was goofy, grandiose and hypomanic yesterday but today he feels depressed and tearful. He slept 3 hours on the last night. He seemed worse than yesterday. I spoke with Dr. Thurmond [name], his primary psychiatrist who recommends ECT treatment. I spoke with Dr. Weber [name] and asked for ECT consult.   Principal Problem: Bipolar I disorder, current or most recent episode manic, with psychotic features ([place]) Diagnosis:   Patient Active Problem List   Diagnosis Date Noted  . Bipolar I disorder, current or most recent episode manic, with psychotic features ([place]) [F31.2] 12/11/2015  . Pulmonary embolism ([place]) [I26.99] 12/04/2015  . Diabetes mellitus without complication ([place]) [id number]   . Hypercholesteremia [E78.00]   . CKD (chronic kidney disease), stage III [N18.3]   . Acute deep vein thrombosis (DVT) of femoral vein of right lower extremity (HCC) [I82.411]    Total Time spent with patient: 20 minutes  Past Psychiatric History: Bipolar disorder.  Past Medical History:  Past Medical History  Diagnosis Date  . Bipolar 1 disorder ([place])   . Diabetes mellitus without complication ([place])   . Hypertension   . Hypercholesteremia   . CKD (chronic kidney disease), stage III   . DVT (deep venous thrombosis) [facility])     Past Surgical History  Procedure Laterality Date  . Appendectomy     Family History:  Family History  Problem Relation Age of Onset  . Stroke Other   . Diabetes Other    Family Psychiatric  History: Daughter with bipolar. Social History:  History  Alcohol Use  . Yes    Comment: rarely     History  Drug Use No    Social History   Social History  . Marital Status: Married    Spouse Name: N/A  . Number of Children: N/A  . Years of Education: N/A   Social History Main Topics  . Smoking status: Never Smoker   . Smokeless tobacco: None  . Alcohol Use: Yes     Comment: rarely   . Drug Use: No  . Sexual Activity: Not Asked   Other Topics Concern  . None   Social History Narrative   Additional Social History:                         Sleep: Poor  Appetite:  Fair  Current Medications: Current Facility-Administered Medications  Medication Dose Route Frequency Provider Last Rate Last Dose  . acetaminophen (TYLENOL) tablet 650 mg  650 mg Oral Q6H PRN Hildred Priest, MD   650 mg at 12/13/15 0957  . Albiglutide PEN 50 mg  50 mg Subcutaneous Q Mon Clovis Fredrickson, MD   50 mg at 12/11/15 2218  . alum & mag hydroxide-simeth (MAALOX/MYLANTA) 200-200-20 MG/5ML suspension 30 mL  30 mL Oral Q4H PRN Hildred Priest, MD      . ARIPiprazole (ABILIFY) tablet 30 mg  30 mg Oral QPM Hildred Priest, MD   30 mg at 12/12/15 1726  . atorvastatin (LIPITOR) tablet 40 mg  40 mg Oral Daily Hildred Priest, MD   40 mg at 12/13/15 [id number]  . canagliflozin (INVOKANA) tablet 100 mg  100 mg Oral BH-q7a Clovis Fredrickson, MD   100 mg at 12/13/15 0631  . carbamazepine (TEGRETOL XR) 12 hr tablet 600 mg  600 mg Oral QHS Josslyn Ciolek B Soledad Budreau, MD   600 mg at 12/12/15 2147  .  carbamazepine (TEGRETOL) tablet 400 mg  400 mg Oral Q breakfast Hildred Priest, MD   400 mg at 12/13/15 [id number]  . enalapril (VASOTEC) tablet 5 mg  5 mg Oral BID Hildred Priest, MD   5 mg at 12/13/15 [id number]  . enoxaparin (LOVENOX) injection 145 mg  1 mg/kg Subcutaneous BID Hildred Priest, MD   145 mg at 12/13/15 0823  . fenofibrate tablet 160 mg  160 mg Oral Daily Hildred Priest, MD   160 mg at 12/13/15 [id number]  . insulin aspart (novoLOG) injection 0-5 Units  0-5 Units Subcutaneous QHS Hildred Priest, MD   0 Units at 12/11/15 2241  . insulin aspart (novoLOG) injection 0-9 Units  0-9 Units Subcutaneous TID WC Hildred Priest, MD   1 Units at 12/13/15 1230  . insulin glargine (LANTUS) injection 60 Units  60 Units  Subcutaneous Daily Hildred Priest, MD   60 Units at 12/12/15 1725  . lamoTRIgine (LAMICTAL) tablet 400 mg  400 mg Oral QHS Hildred Priest, MD   400 mg at 12/12/15 2146  . magnesium hydroxide (MILK OF MAGNESIA) suspension 30 mL  30 mL Oral Daily PRN Hildred Priest, MD      . QUEtiapine (SEROQUEL) tablet 800 mg  800 mg Oral QHS Hildred Priest, MD   800 mg at 12/12/15 2146  . temazepam (RESTORIL) capsule 60 mg  60 mg Oral QHS Meika Earll B Aleynah Rocchio, MD      . warfarin (COUMADIN) tablet 10 mg  10 mg Oral q1800 Clovis Fredrickson, MD      . Warfarin - Pharmacist Dosing Inpatient   Does not apply [id number] Hildred Priest, MD        Lab Results:  Results for orders placed or performed during the hospital encounter of 12/11/15 (from the past 48 hour(s))  Glucose, capillary     Status: Abnormal   Collection Time: 12/11/15  5:03 PM  Result Value Ref Range   Glucose-Capillary 124 (H) 65 - 99 mg/dL   Comment 1 Notify RN   Lipid panel, fasting     Status: Abnormal   Collection Time: 12/11/15  5:12 PM  Result Value Ref Range   Cholesterol 143 0 - 200 mg/dL   Triglycerides 286 (H) <150 mg/dL   HDL 32 (L) >40 mg/dL   Total CHOL/HDL Ratio 4.5 RATIO   VLDL 57 (H) 0 - 40 mg/dL   LDL Cholesterol 54 0 - 99 mg/dL    Comment:        Total Cholesterol/HDL:CHD Risk Coronary Heart Disease Risk Table                     Men   Women  1/2 Average Risk   3.4   3.3  Average Risk       5.0   4.4  2 X Average Risk   9.6   7.1  3 X Average Risk  23.4   11.0        Use the calculated Patient Ratio above and the CHD Risk Table to determine the patient's CHD Risk.        ATP III CLASSIFICATION (LDL):  <100     mg/dL   Optimal  100-129  mg/dL   Near or Above                    Optimal  130-159  mg/dL   Borderline  160-189  mg/dL   High  >190     mg/dL   Very  High   Prolactin     Status: None   Collection Time: 12/11/15  5:12 PM  Result Value Ref Range    Prolactin 4.4 4.0 - 15.2 ng/mL    Comment: (NOTE) Performed At: [facility] [place], [place] [id number] Lindon Romp MD [id number]   TSH     Status: None   Collection Time: 12/11/15  5:12 PM  Result Value Ref Range   TSH 0.405 0.350 - 4.500 uIU/mL  Glucose, capillary     Status: Abnormal   Collection Time: 12/11/15  8:57 PM  Result Value Ref Range   Glucose-Capillary 127 (H) 65 - 99 mg/dL  Glucose, capillary     Status: Abnormal   Collection Time: 12/12/15  6:33 AM  Result Value Ref Range   Glucose-Capillary 141 (H) 65 - 99 mg/dL  Protime-INR     Status: Abnormal   Collection Time: 12/12/15  6:41 AM  Result Value Ref Range   Prothrombin Time 30.4 (H) 11.4 - 15.0 seconds   INR 2.97   Glucose, capillary     Status: Abnormal   Collection Time: 12/12/15 11:23 AM  Result Value Ref Range   Glucose-Capillary 131 (H) 65 - 99 mg/dL  Glucose, capillary     Status: Abnormal   Collection Time: 12/12/15  4:49 PM  Result Value Ref Range   Glucose-Capillary 120 (H) 65 - 99 mg/dL   Comment 1 Notify RN   Glucose, capillary     Status: None   Collection Time: 12/12/15  8:39 PM  Result Value Ref Range   Glucose-Capillary 80 65 - 99 mg/dL   Comment 1 Notify RN   Protime-INR     Status: Abnormal   Collection Time: 12/13/15  6:16 AM  Result Value Ref Range   Prothrombin Time 21.9 (H) 11.4 - 15.0 seconds   INR 1.92   CBC     Status: Abnormal   Collection Time: 12/13/15  6:16 AM  Result Value Ref Range   WBC 4.0 3.8 - 10.6 K/uL   RBC 4.31 (L) 4.40 - 5.90 MIL/uL   Hemoglobin 12.6 (L) 13.0 - 18.0 g/dL   HCT 37.0 (L) 40.0 - 52.0 %   MCV 85.9 80.0 - 100.0 fL   MCH 29.3 26.0 - 34.0 pg   MCHC 34.1 32.0 - 36.0 g/dL   RDW 12.9 11.5 - 14.5 %   Platelets 231 150 - 440 K/uL  Glucose, capillary     Status: None   Collection Time: 12/13/15  6:36 AM  Result Value Ref Range   Glucose-Capillary 80 65 - 99 mg/dL  Glucose, capillary     Status: Abnormal   Collection  Time: 12/13/15 11:52 AM  Result Value Ref Range   Glucose-Capillary 134 (H) 65 - 99 mg/dL   Comment 1 Notify RN     Blood Alcohol level:  Lab Results  Component Value Date   ETH <5 [id number]    Metabolic Disorder Labs: Lab Results  Component Value Date   HGBA1C 8.3* 12/04/2015   MPG 192 12/04/2015   MPG 318* 08/24/2010   Lab Results  Component Value Date   PROLACTIN 4.4 12/11/2015   Lab Results  Component Value Date   CHOL 143 12/11/2015   TRIG 286* 12/11/2015   HDL 32* 12/11/2015   CHOLHDL 4.5 12/11/2015   VLDL 57* 12/11/2015   LDLCALC 54 12/11/2015   LDLCALC 56 12/05/2015    Physical Findings: AIMS:  , ,  ,  ,  CIWA:    COWS:     Musculoskeletal: Strength & Muscle Tone: within normal limits Gait & Station: normal Patient leans: N/A  Psychiatric Specialty Exam: Physical Exam  Nursing note and vitals reviewed.   Review of Systems  Psychiatric/Behavioral: Positive for depression. The patient has insomnia.   All other systems reviewed and are negative.   Blood pressure 116/64, pulse 91, temperature 98.6 F (37 C), temperature source Oral, resp. rate 20, height 6' (1.829 m), weight 143.79 kg (317 lb), SpO2 96 %.Body mass index is 42.98 kg/(m^2).  General Appearance: Casual  Eye Contact:  Good  Speech:  Clear and Coherent  Volume:  Normal  Mood:  Depressed and Worthless  Affect:  Appropriate  Thought Process:  Disorganized  Orientation:  Full (Time, Place, and Person)  Thought Content:  Delusions and Paranoid Ideation  Suicidal Thoughts:  No  Homicidal Thoughts:  No  Memory:  Immediate;   Fair Recent;   Fair Remote;   Fair  Judgement:  Impaired  Insight:  Shallow  Psychomotor Activity:  Normal  Concentration:  Concentration: Fair and Attention Span: Fair  Recall:  [employer] of Knowledge:  Fair  Language:  Fair  Akathisia:  No  Handed:  Right  AIMS (if indicated):     Assets:  Communication Skills Desire for Improvement Financial  Resources/Insurance Housing Intimacy Physical Health Resilience Social Support Transportation Vocational/Educational  ADL's:  Intact  Cognition:  WNL  Sleep:  Number of Hours: 2.45     Treatment Plan Summary: Daily contact with patient to assess and evaluate symptoms and progress in treatment and Medication management   Mr. Vukovich is a 50 year old male with history of bipolar illness admitted in a manic, psychotic episode in the context of recent severe physical illness and unintentional medication changes.  1. Mood and psychosis. The patient was restarted on a combination of Abilify and Seroquel for psychosis and Tegretol and Lamictal for mood stabilization.  2. Insomnia. He was restarted on Restoril 30 mg but slept 2 hours only. Will increase to 60 mg tonight.   3. History of PE and DVT. He is on Lovenox and warfarin.  4. Diabetes. He is on weekly Tenzeum, Invokana, ADA diet, blood glucose monitoring and sliding scale insulin.  5. Dyslipidemia. He is on Lipitor and Fenofibrate.  6. Hypertension. He is on Vasotec.  7. Metabolic syndrome screening. Lipid profile, TSH, hemoglobin A1c and prolactin are pending.  8. ECT. Spoke with Drs. Ryan and [employer] about ECT.   9. Disposition. He will be discharged to home with his wife. He will follow up with Dr. Thurmond [name], his primary psychiatrist.  Orson Slick, MD 12/13/2015, 1:06 PM

## 2015-12-14 LAB — PROTIME-INR
INR: 1.95
PROTHROMBIN TIME: 22.1 s — AB (ref 11.4–15.0)

## 2015-12-14 LAB — GLUCOSE, CAPILLARY
GLUCOSE-CAPILLARY: 84 mg/dL (ref 65–99)
Glucose-Capillary: 108 mg/dL — ABNORMAL HIGH (ref 65–99)
Glucose-Capillary: 109 mg/dL — ABNORMAL HIGH (ref 65–99)
Glucose-Capillary: 95 mg/dL (ref 65–99)

## 2015-12-14 MED ORDER — HALOPERIDOL 5 MG PO TABS
10.0000 mg | ORAL_TABLET | Freq: Four times a day (QID) | ORAL | Status: DC | PRN
  Administered 2015-12-15: 10 mg via ORAL
  Filled 2015-12-14: qty 2

## 2015-12-14 MED ORDER — DIPHENHYDRAMINE HCL 25 MG PO CAPS
25.0000 mg | ORAL_CAPSULE | Freq: Once | ORAL | Status: AC
  Administered 2015-12-14: 25 mg via ORAL
  Filled 2015-12-14: qty 1

## 2015-12-14 MED ORDER — HALOPERIDOL 5 MG PO TABS
20.0000 mg | ORAL_TABLET | Freq: Once | ORAL | Status: AC
  Administered 2015-12-14: 20 mg via ORAL
  Filled 2015-12-14: qty 4

## 2015-12-14 MED ORDER — HALOPERIDOL 5 MG PO TABS
20.0000 mg | ORAL_TABLET | Freq: Every day | ORAL | Status: DC
  Filled 2015-12-14: qty 4

## 2015-12-14 NOTE — Progress Notes (Signed)
Recreation Therapy Notes  Date: 07.13.17 Time: 9:30 am Location: Craft Room  Group Topic: Leisure Education  Goal Area(s) Addresses:  Patient will identify activities for each letter of the alphabet. Patient will verbalize ability to use leisure as a Technical sales engineer.  Behavioral Response: Did not attend  Intervention: Leisure Alphabet  Activity: Patients were given a Leisure Air traffic controller and instructed to identify a leisure activity for each letter of the alphabet.   Education: LRT educated patients on what they need to participate in leisure.  Education Outcome: Patient did not attend group.   Clinical Observations/Feedback: Patient did not attend group.  [name], LRT/CTRS 12/14/2015 10:13 AM

## 2015-12-14 NOTE — BHH Group Notes (Signed)
[facility] Group Notes:  (Nursing/MHT/Case Management/Adjunct)  Date:  12/14/2015  Time:  3:11 AM  Type of Therapy:  Psychoeducational Skills  Participation Level:  Did Not Attend  Summary of Progress/Problems:  [name] 12/14/2015, 3:11 AM

## 2015-12-14 NOTE — Consult Note (Signed)
WOC wound consult note Reason for Consult: Edema, blistering to lower legs.  Recent infestation with bed bugs, per patient and that is when this began.   Wound type:Dermatitis from bed bugs, scratching Pressure Ulcer POA: N/A Measurement:Left anterior pretibial leg with 2 cm x 1 cm blood filled blister, scattered nonintact lesions, weeping serosanguinous drainage.  Feet are edematous. Left metatarsals are bruised.  Patient states he is on blood thinners and bruises easily.  Was wearing water shoes and encouraged to wear nonslip socks here and not the water shoes.   Right anterior leg with scattered nonintact lesions 0.5 cm in diameter.  Wound [id number] and moist Drainage (amount, consistency, odor) minimal serosanguinous.  No odor from wounds.  Foot odor from water shoes.  Periwound:Edema, erythema Dressing procedure/placement/frequency:Cleanse bilateral legs with soap and water.  Zinc layer secured with self adherent Coban layer. Can be removed in one week on Thursday.  Will reevaluate if still here.  Otherwise, patient advised to remove in one week.  Will not follow at this time.  Please re-consult if needed.  Domenic Moras RN BSN [place] Pager [phone]

## 2015-12-14 NOTE — BHH Group Notes (Signed)
Goals Group Date/Time: 12/14/15 9am Type of Therapy and Topic: Group Therapy: Goals Group: SMART Goals   Participation Level: Moderate  Description of Group:    The purpose of a daily goals group is to assist and guide patients in setting recovery/wellness-related goals. The objective is to set goals as they relate to the crisis in which they were admitted. Patients will be using SMART goal modalities to set measurable goals. Characteristics of realistic goals will be discussed and patients will be assisted in setting and processing how one will reach their goal. Facilitator will also assist patients in applying interventions and coping skills learned in psycho-education groups to the SMART goal and process how one will achieve defined goal.   Therapeutic Goals:   -Patients will develop and document one goal related to or their crisis in which brought them into treatment.  -Patients will be guided by LCSW using SMART goal setting modality in how to set a measurable, attainable, realistic and time sensitive goal.  -Patients will process barriers in reaching goal.  -Patients will process interventions in how to overcome and successful in reaching goal.   Patient's Goal: Pt was disorganized in speech and thoughts were disconnected and CSw had to redirect the pt.  Pt was polite and cooperative with the CSW and other group members and focused and attentive to the topics discussed and the sharing of others.      Therapeutic Modalities:  Motivational Interviewing  Art gallery manager  SMART goals setting   [name]. Adonai Selsor, LCSWA, LCAS

## 2015-12-14 NOTE — Plan of Care (Signed)
Problem: Education: Goal: Emotional status will improve Outcome: Progressing Based on previous documentation, no improvement in mental status observed.

## 2015-12-14 NOTE — BHH Group Notes (Signed)
[facility] Group Notes:  (Nursing/MHT/Case Management/Adjunct)  Date:  12/14/2015  Time:  3:59 PM  Type of Therapy:  Psychoeducational Skills  Participation Level:  Minimal  Participation Quality:  Intrusive  Affect:  Not Congruent  Cognitive:  Disorganized  Insight:  None  Engagement in Group:  Distracting and Poor  Modes of Intervention:  Discussion, Education and Support  Summary of Progress/Problems:  Cameron [name] 12/14/2015, 3:59 PM

## 2015-12-14 NOTE — BHH Group Notes (Signed)
[facility] LCSW Group Therapy   12/14/2015 1pm  Type of Therapy: Group Therapy   Participation Level: Active   Participation Quality: Attentive, Sharing and Supportive   Affect: Appropriate   Cognitive: Alert and Oriented   Insight: Developing/Improving and Engaged   Engagement in Therapy: Developing/Improving and Engaged   Modes of Intervention: Clarification, Confrontation, Discussion, Education, Exploration, Limit-setting, Orientation, Problem-solving, Rapport Building, Art therapist, Socialization and Support   Summary of Progress/Problems: The topic for group was balance in life. Today's group focused on defining balance in one's own words, identifying things that can knock one off balance, and exploring healthy ways to maintain balance in life. Group members were asked to provide an example of a time when they felt off balance, describe how they handled that situation, and process healthier ways to regain balance in the future. Group members were asked to share the most important tool for maintaining balance that they learned while at [facility] and how they plan to apply this method after discharge. Pt presented as pleasant but manic and while his thoughts were at times connected, soon the pt would begin to talk with pressured speech and his soon speak in disconnected, short, clipped phrases.  [name]. Fanchon Papania, LCSWA, LCAS  12/14/15

## 2015-12-14 NOTE — Progress Notes (Signed)
ANTICOAGULATION CONSULT NOTE - Follow Up Consult  Pharmacy Consult for LMWH/VKA Indication: VTE treatment  No Known Allergies  Patient Measurements: Height: 6' (182.9 cm) Weight: (!) 317 lb (143.79 kg) IBW/kg (Calculated) : 77.6   Vital Signs: BP: 142/108 mmHg (07/12 2100) Pulse Rate: 106 (07/12 2100)  Labs:  Recent Labs  12/12/15 0641 12/13/15 0616 12/14/15 0714  HGB  --  12.6*  --   HCT  --  37.0*  --   PLT  --  231  --   LABPROT 30.4* 21.9* 22.1*  INR 2.97 1.92 1.95    Estimated Creatinine Clearance: 80.4 mL/min (by C-G formula based on Cr of 1.6).   Medical History: Past Medical History  Diagnosis Date  . Bipolar 1 disorder ([place])   . Diabetes mellitus without complication ([place])   . Hypertension   . Hypercholesteremia   . CKD (chronic kidney disease), stage III   . DVT (deep venous thrombosis) ([facility])     Medications:  Warfarin 12.5 mg po daily Lovenox 145 mg subq q12h  Assessment: 36 yom admitted to ED [facility] with manic symptoms. Recently discharged from [facility] with D[facility]/PE, had been bridging LMWH and VKA.  INR History: 7/9: INR - 1.99 7/10: no INR- warfarin 12.5 mg po daily at 0200 7/11: INR - 2.97- warfarin held 7/12: INR - 1.92 - warfarin 10 mg 7/13: INR - 1.95  Goal of Therapy:  INR 2-3 Monitor platelets by anticoagulation protocol: Yes   Plan:  Continue Lovenox 145 mg subcutaneously BID until INR > 2 for 2 days.  Will continue warfarin at 10 mg daily and recheck INR in AM.  If INR <2, may consider dose increase to 11 mg daily.   Pharmacy will continue to follow.   Hamzeh Tall G, Pharm.D., BCPS Clinical Pharmacist 12/14/2015,8:55 AM

## 2015-12-14 NOTE — Consult Note (Signed)
  ECT: Follow-up today. Spoke briefly with the patient and reviewed the chart. Patient continues to be manic. Constant disorganized flight of ideas and hyperverbal bowel at a. Fortunately it does not look like he's gotten to the point of physical aggression yet and he has been compliant with treatment. Patient was meeting with his wife this evening and I was able to speak briefly with her as well. I repeated to the patient my strong recommendation that we pursue ECT and reminded him that Dr. Thurmond Butts is endorsing this. Wife also made it clear that she was in favor of the treatment. Once again the patient had flight of ideas and was not able to be rational but refused to consent to ECT.  I will continue to keep up with him for now. I'm on call over the weekend. We will try and keep urging him to reconsider ECT.

## 2015-12-14 NOTE — Plan of Care (Signed)
Problem: Coping: Goal: Ability to verbalize frustrations and anger appropriately will improve Outcome: Not Progressing Patient is still intrusive & hyperverbal.

## 2015-12-14 NOTE — Progress Notes (Signed)
D: Observed pt pacing in with hallway and walking up to nurses station. Patient alert and oriented x4. Patient denies SI/HI/AVH, but pt is definitely responding to internal stimuli. Pt talking to self very loudly all throughout the night.  Pt affect is depressed, preoccupied, sad and labile. Pt is very demanding, needy, and intrusive. Pt very hyperverbal with religous preoccupation. Pt also paranoid, talking about being tracked and watched by "russians" through his self phone. Whistled a lot.  A: Offered active listening and support. Provided therapeutic communication. Administered scheduled medications. Redirected pt multiple times. Educated pt on unit policy and appropriate behavior on the unit. Attempted to provide a calm environment.  R: Pt needs constant redirection. Pt got very little sleep. Pt continues to talk to self all night. Pt medication compliant. Will continue Q15 min. checks. Safety maintained.

## 2015-12-14 NOTE — Progress Notes (Signed)
Patient is intrusive with some patient and hyperverbal with disorganized talks.Denies suicidal and homicidal ideations.Patient is talking to himself loud in his room.Wound care consulted.Dressing applied is good for a week.Compliant with medications.Attended groups.

## 2015-12-15 LAB — GLUCOSE, CAPILLARY
GLUCOSE-CAPILLARY: 71 mg/dL (ref 65–99)
GLUCOSE-CAPILLARY: 88 mg/dL (ref 65–99)
GLUCOSE-CAPILLARY: 105 mg/dL — AB (ref 65–99)
GLUCOSE-CAPILLARY: 95 mg/dL (ref 65–99)

## 2015-12-15 LAB — PROTIME-INR
INR: 2.56
Prothrombin Time: 27.2 seconds — ABNORMAL HIGH (ref 11.4–15.0)

## 2015-12-15 MED ORDER — HALOPERIDOL 5 MG PO TABS
20.0000 mg | ORAL_TABLET | Freq: Four times a day (QID) | ORAL | Status: DC | PRN
  Administered 2015-12-16 – 2015-12-22 (×6): 20 mg via ORAL
  Filled 2015-12-15 (×6): qty 4

## 2015-12-15 MED ORDER — WARFARIN SODIUM 2.5 MG PO TABS
5.0000 mg | ORAL_TABLET | Freq: Every day | ORAL | Status: DC
  Administered 2015-12-15 – 2015-12-16 (×2): 5 mg via ORAL
  Filled 2015-12-15 (×2): qty 2

## 2015-12-15 NOTE — BHH Group Notes (Signed)
[facility] Group Notes:  (Nursing/MHT/Case Management/Adjunct)  Date:  12/15/2015  Time:  1:56 AM  Type of Therapy:  Group Therapy  Participation Level:  Did Not Attend   Summary of Progress/Problems:  Cameron [name] 12/15/2015, 1:56 AM

## 2015-12-15 NOTE — Progress Notes (Signed)
D: Observed pt pacing in with hallway and walking up to nurses station. Patient alert and oriented. Patient denies SI/HI/AVH, but pt is definitely responding to internal stimuli. Pt talking very loudly to self all throughout the night.Pt affect is preoccupied, euphoric, labile and anxious. Pt is very demanding, needy, and intrusive. Pt very hyperverbal with religous preoccupation.Pt conversation is filled with flight of ideas. Pt very tangential and has difficulty staying focused. Pt has not exhibited any aggression. Pt legs are covered with bandages from  wound care nurse. Pt mentioned multiple times that staff is "awesome-sauce."  A: Offered active listening and support. Provided therapeutic communication. Administered scheduled medications. Redirected pt multiple times. Educated pt on unit policy and appropriate behavior on the unit. Attempted to provide a calm environment.  R: Pt needs constant redirection. Pt got very little sleep and needed to be walked back to room with assistance. Pt slept through the night. Pt medication compliant. Will continue Q15 min. checks. Safety maintained.

## 2015-12-15 NOTE — Progress Notes (Signed)
Recreation Therapy Notes  Date: 07.14.17 Time: 9:30 am Location: Craft Room  Group Topic: Coping Skills  Goal Area(s) Addresses:  Patient will participate in healthy coping skill. Patient will verbalize one emotion experienced during group.  Behavioral Response: Arrived late, Attentive, Interactive  Intervention: Coloring  Activity: Patients were given coloring sheets and instructed to color while thinking about the emotions they were feeling and what their mind was focused on.  Education: LRT educated patients on healthy coping skills.  Education Outcome: In group clarification offered   Clinical Observations/Feedback: Patient arrived to group at approximately 10:06 am. LRT explained activity. Patient colored coloring sheet. Patient contributed to group discussion by stating what his mind was focused on. After group, patient was talking to LRT about the Lamb of [employer], bed bugs, and his cat.  [name], LRT/CTRS 12/15/2015 10:35 AM
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
## 2015-12-15 NOTE — Progress Notes (Signed)
1:1 Note- per MD order, Patient placed on 1:1 with sitter due to increasing agitation and manic behavior, pt is tangential, having flight of ideas, grandiose, and religously preoccupied, causes disruption in the milieu and is difficult to redirect, pt is compliant with medications and is agreeable to stay on back hallway with sitter.

## 2015-12-15 NOTE — Progress Notes (Signed)
1:1 Note-Pt eating dinner in green hallway dayroom, still tangential, manic, hyperverbal, 1:1 continued for safety.

## 2015-12-15 NOTE — Plan of Care (Signed)
Problem: Activity: Goal: Sleeping patterns will improve Outcome: Progressing Per night shift report, pt slept 6 hours and 15 minutes last night which was an improvement from only 1.5 hours the night before.

## 2015-12-15 NOTE — Plan of Care (Signed)
Problem: Education: Goal: Will be free of psychotic symptoms Outcome: Progressing Pt remains hyper-verbal, talks to self throughout shift, tangential, with religious preoccupation.

## 2015-12-15 NOTE — BHH Group Notes (Signed)
[place] Group Notes:  (Nursing/MHT/Case Management/Adjunct)  Date:  12/15/2015  Time:  3:38 PM  Type of Therapy:  Movement Therapy  Participation Level:  Active  Participation Quality:  Attentive and Sharing  Affect:  Excited and extremely talkative   Cognitive:  Disorganized and Confused  Insight:  Lacking  Engagement in Group:  Off Topic  Modes of Intervention:  Socialization  Summary of Progress/Problems:  Cameron [name] Cameron [name] 12/15/2015, 3:38 PM

## 2015-12-15 NOTE — Progress Notes (Signed)
ANTICOAGULATION CONSULT NOTE - Follow Up Consult  Pharmacy Consult for LMWH/VKA Indication: VTE treatment  No Known Allergies  Patient Measurements: Height: 6' (182.9 cm) Weight: (!) 317 lb (143.79 kg) IBW/kg (Calculated) : 77.6   Vital Signs: Temp: 98.3 F (36.8 C) (07/14 0716) Temp Source: Oral (07/14 0716) BP: 116/76 mmHg (07/14 0716) Pulse Rate: 100 (07/14 0716)  Labs:  Recent Labs  12/13/15 0616 12/14/15 0714 12/15/15 0655  HGB 12.6*  --   --   HCT 37.0*  --   --   PLT 231  --   --   LABPROT 21.9* 22.1* 27.2*  INR 1.92 1.95 2.56    Estimated Creatinine Clearance: 80.4 mL/min (by C-G formula based on Cr of 1.6).   Medical History: Past Medical History  Diagnosis Date  . Bipolar 1 disorder ([place])   . Diabetes mellitus without complication ([place])   . Hypertension   . Hypercholesteremia   . CKD (chronic kidney disease), stage III   . DVT (deep venous thrombosis) ([facility])     Medications:  Warfarin 10 mg po daily Lovenox 145 mg subq q12h  Assessment: 29 yom admitted to ED [facility] with manic symptoms. Recently discharged from [facility] with DVT/PE, had been bridging LMWH and VKA.  INR History: 7/9: INR - 1.99 7/10: no INR- warfarin 12.5 mg po daily at 0200 7/11: INR - 2.97- warfarin held 7/12: INR - 1.92 - warfarin 10 mg 7/13: INR - 1.95 - warfarin 10 mg 7/14: INR - 2.56  Goal of Therapy:  INR 2-3 Monitor platelets by anticoagulation protocol: Yes   Plan:  Continue Lovenox 145 mg subcutaneously BID until INR > 2 for 2 days.  Will decrease dose of warfarin to 5mg  daily and recheck INR in AM.  Pharmacy will continue to follow.   Olivia Canter, RPh Clinical Pharmacist 12/15/2015,8:03 AM

## 2015-12-16 LAB — GLUCOSE, CAPILLARY
GLUCOSE-CAPILLARY: 117 mg/dL — AB (ref 65–99)
GLUCOSE-CAPILLARY: 119 mg/dL — AB (ref 65–99)
GLUCOSE-CAPILLARY: 111 mg/dL — AB (ref 65–99)
Glucose-Capillary: 111 mg/dL — ABNORMAL HIGH (ref 65–99)

## 2015-12-16 LAB — PROTIME-INR
INR: 2.73
Prothrombin Time: 28.5 seconds — ABNORMAL HIGH (ref 11.4–15.0)

## 2015-12-16 MED ORDER — LORAZEPAM 2 MG PO TABS
2.0000 mg | ORAL_TABLET | Freq: Once | ORAL | Status: AC
Start: 2015-12-16 — End: 2015-12-16
  Administered 2015-12-16: 2 mg via ORAL
  Filled 2015-12-16: qty 1

## 2015-12-16 NOTE — Plan of Care (Signed)
Problem: Coping: Goal: Ability to interact with others will improve Outcome: Not Progressing Patient is intrusive and loud with flight of ideas.  Requires frequent redirection.

## 2015-12-16 NOTE — Plan of Care (Signed)
Problem: Safety: Goal: Ability to redirect hostility and anger into socially appropriate behaviors will improve Outcome: Progressing Pt swears and yells when upset or feels like he's not getting his way.

## 2015-12-16 NOTE — BHH Group Notes (Signed)
[place] LCSW Group Therapy  12/16/2015 1:28 PM  Type of Therapy:  Group Therapy  Participation Level:  Did Not Attend but was invited to attend group.   Summary of Progress/Problems:  Emotional Regulation: Patients will identify both negative and positive emotions. They will discuss emotions they have difficulty regulating and how they impact their lives. Patients will be asked to identify healthy coping skills to combat unhealthy reactions to negative emotions.    Johnnie Moten [place]. [place], [place]  12/16/2015, 1:28 PM

## 2015-12-16 NOTE — Progress Notes (Signed)
D: Nurse was called at approximately, because sitter had brought pt to bathroom and pt would not get up off of toilet. Pt became very loud, and agitated demanding to be taken to a "closet across the nursing station" and screamed at staff for "not listening to me." Pt stated he could not stand up off of the toilet, and refused to get off because "we were not listening to him." Pt could not be calmed, or redirected. Pt would not listen to reason. A: Called Dr. Weber Cooks to make him aware of situation. Order for Ativan 2mg  oral was placed per MD order. Pt took oral prn haldol and ativan with encouragement.  R: Pt remained loud and agitated for the next hour, but started to become more sedated and agreeable to getting off toilet. Pt attempted to get into wheelchair with staff help, but was only able to get partially on the wheel chair. Pt rolled self out of bath room with staff assistance with part of body on wheelchair, and staff then assisted pt to lay on floor with pillows under. Lift equipment was used to get pt from floor to bed, as pt was not able to get self up. Pt remained very manic. Pt eventually regained some strength, and was able to be assisted to recliner chair as patient was very resistant to being in bed. Pt eventually fell asleep in recliner. 1:1 sitter remains.

## 2015-12-16 NOTE — BHH Group Notes (Signed)
[place] LCSW Group Therapy   12/16/2015 9:30 AM   Type of Therapy: Group Therapy   Participation Level: Active   Participation Quality: Attentive, Sharing and Supportive   Affect: Appropriate   Cognitive: Alert and Oriented   Insight: Developing/Improving and Engaged   Engagement in Therapy: Developing/Improving and Engaged   Modes of Intervention: Clarification, Confrontation, Discussion, Education, Exploration, Limit-setting, Orientation, Problem-solving, Rapport Building, Art therapist, Socialization and Support   Summary of Progress/Problems: The topic for today was feelings about relapse. Pt discussed what relapse prevention is to them and identified triggers that they are on the path to relapse. Pt processed their feeling towards relapse and was able to relate to peers. Pt discussed coping skills that can be used for relapse prevention. Pt shared that in order to prevent relapse the pt has to learn to cope .  When prompted by the CSW to clarify the pt began to speak quickly and with disconnected thoughts and speech, and presented as manic.  Pt initially presented as pleasant and calm, aeb the pt being able to listen more than talk which was unusual for the pt aeb previous sessions, but soon thereafter the pt began to interrupt the CSW and others.  Pt was overly polite and cooperative with the CSW and other group members and focused and attentive to the topics discussed and the sharing of others, but did share out of turn a great deal.  Pt seems to have made little improvement during group, as evidenced by dominating group sharing, sharing at length even while CSW is attempting to redirect the pt and pt presents as overly energetic and increasingly manic, as compared to previous sessions.    [name]. Iridiana Fonner, MSW, LCSWA, LCAS

## 2015-12-16 NOTE — Progress Notes (Signed)
Spoke with Dr. Weber Cooks about pt being on enoxaparin and warfarin with INR >2 for two days. MD stated he would d/c the enoxaparin order.

## 2015-12-16 NOTE — Progress Notes (Signed)
D:  1:1 sitter at bedside.  Patient is hyper-verbal.  Disorganized thought process.  Flight of ideas.  Speech loud and pressured.  Religiously preoccupied.  Labile.  Difficult to re-direct. A:  Support offered.  Safety maintained.  Scheduled medications administered. R:  Verbally aggressive and argumentative.

## 2015-12-16 NOTE — Progress Notes (Signed)
D: Observed pt in back dayroom with 1:1 sitter.  Patient alert and oriented. Patient denies SI/HI/AVH, but pt is definitely responding to internal stimuli. Pt talking very loudly to self all throughout the night.Pt affect is preoccupied, euphoric, labile and anxious. Pt is very demanding, needy, and intrusive. Pt very hyperverbal with religous preoccupation.Pt conversation is filled with flight of ideas. Pt very tangential and has difficulty staying focused. Pt seems increasingly manic, louder, and more difficult to redirect than previous night.  A: Offered active listening and support. Provided therapeutic communication. Administered scheduled medications. Redirected pt multiple times. Educated pt on unit policy and appropriate behavior on the unit. Attempted to provide a calm environment.  R: Pt needs constant redirection. Pt medication compliant. Will continue Q15 min. checks. Safety maintained. 1:1 sitter maintained per MD order.

## 2015-12-16 NOTE — Progress Notes (Signed)
Patient remains manic, He keeps talking and pacing, sitter with him at all times, Patient will jump from topic to topic and then burst into laughter, patient did take all po medications this am, Patient denies Si/hi or avh at this time, staff will continue to monitor.

## 2015-12-16 NOTE — Progress Notes (Signed)
ANTICOAGULATION CONSULT NOTE - Follow Up Consult  Pharmacy Consult for LMWH/VKA Indication: VTE treatment  No Known Allergies  Patient Measurements: Height: 6' (182.9 cm) Weight: (!) 317 lb (143.79 kg) IBW/kg (Calculated) : 77.6   Vital Signs: Temp: 97.8 F (36.6 C) (07/15 0703) Temp Source: Oral (07/15 0703) BP: 116/100 mmHg (07/15 0703) Pulse Rate: 93 (07/15 0703)  Labs:  Recent Labs  12/14/15 0714 12/15/15 0655 12/16/15 1059  LABPROT 22.1* 27.2* 28.5*  INR 1.95 2.56 2.73    Estimated Creatinine Clearance: 80.4 mL/min (by C-G formula based on Cr of 1.6).   Medical History: Past Medical History  Diagnosis Date  . Bipolar 1 disorder ([place])   . Diabetes mellitus without complication ([place])   . Hypertension   . Hypercholesteremia   . CKD (chronic kidney disease), stage III   . DVT (deep venous thrombosis) (HCC)     Medications:  Warfarin 10 mg po daily Lovenox 145 mg subq q12h  Assessment: 51 yom admitted to ED BHU with manic symptoms. Recently discharged from [facility] with DVT/PE, had been bridging LMWH and VKA.  INR History: 7/9: INR - 1.99 7/10: no INR- warfarin 12.5 mg po daily at 0200 7/11: INR - 2.97- warfarin held 7/12: INR - 1.92 - warfarin 10 mg 7/13: INR - 1.95 - warfarin 10 mg 7/14: INR - 2.56 - warfarin 5 mg 7/15: INR - 2.73  Goal of Therapy:  INR 2-3 Monitor platelets by anticoagulation protocol: Yes   Plan:  INR now > 2 for 2 days and increasing. Will page MD to d/c LMWH. Will continue warfarin 5mg  daily and recheck INR in AM.  Pharmacy will continue to follow.   Napoleon Form, RPh Clinical Pharmacist 12/16/2015,11:54 AM
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
## 2015-12-17 LAB — GLUCOSE, CAPILLARY
GLUCOSE-CAPILLARY: 85 mg/dL (ref 65–99)
GLUCOSE-CAPILLARY: 106 mg/dL — AB (ref 65–99)
GLUCOSE-CAPILLARY: 115 mg/dL — AB (ref 65–99)
Glucose-Capillary: 102 mg/dL — ABNORMAL HIGH (ref 65–99)

## 2015-12-17 LAB — PROTIME-INR
INR: 2.39
PROTHROMBIN TIME: 25.8 s — AB (ref 11.4–15.0)

## 2015-12-17 LAB — CBC
HCT: 36.6 % — ABNORMAL LOW (ref 40.0–52.0)
Hemoglobin: 12.6 g/dL — ABNORMAL LOW (ref 13.0–18.0)
MCH: 29.3 pg (ref 26.0–34.0)
MCHC: 34.3 g/dL (ref 32.0–36.0)
MCV: 85.2 fL (ref 80.0–100.0)
PLATELETS: 283 10*3/uL (ref 150–440)
RBC: 4.29 MIL/uL — ABNORMAL LOW (ref 4.40–5.90)
RDW: 12.7 % (ref 11.5–14.5)
WBC: 4.3 10*3/uL (ref 3.8–10.6)

## 2015-12-17 MED ORDER — WARFARIN SODIUM 2.5 MG PO TABS
7.5000 mg | ORAL_TABLET | Freq: Every day | ORAL | Status: DC
  Administered 2015-12-17 – 2015-12-20 (×4): 7.5 mg via ORAL
  Filled 2015-12-17 (×4): qty 3

## 2015-12-17 MED ORDER — LORAZEPAM 2 MG PO TABS
2.0000 mg | ORAL_TABLET | ORAL | Status: DC | PRN
  Administered 2015-12-17 – 2015-12-30 (×12): 2 mg via ORAL
  Filled 2015-12-17 (×13): qty 1

## 2015-12-17 NOTE — Progress Notes (Signed)
In courtyard with sitter and security.

## 2015-12-17 NOTE — Progress Notes (Signed)
eating 

## 2015-12-17 NOTE — Progress Notes (Signed)
Standing in hallway talking to sitter.

## 2015-12-17 NOTE — Progress Notes (Signed)
ANTICOAGULATION CONSULT NOTE - Follow Up Consult  Pharmacy Consult for LMWH/VKA Indication: VTE treatment  No Known Allergies  Patient Measurements: Height: 6' (182.9 cm) Weight: (!) 317 lb (143.79 kg) IBW/kg (Calculated) : 77.6   Vital Signs:    Labs:  Recent Labs  12/15/15 0655 12/16/15 1059 12/17/15 0809  HGB  --   --  12.6*  HCT  --   --  36.6*  PLT  --   --  283  LABPROT 27.2* 28.5* 25.8*  INR 2.56 2.73 2.39    Estimated Creatinine Clearance: 80.4 mL/min (by C-G formula based on Cr of 1.6).   Medical History: Past Medical History  Diagnosis Date  . Bipolar 1 disorder ([place])   . Diabetes mellitus without complication ([place])   . Hypertension   . Hypercholesteremia   . CKD (chronic kidney disease), stage III   . DVT (deep venous thrombosis) (HCC)     Medications:  Warfarin 10 mg po daily Lovenox 145 mg subq q12h  Assessment: 22 yom admitted to ED BHU with manic symptoms. Recently discharged from [facility] with DVT/PE, had been bridging LMWH and VKA.  INR History: 7/9: INR - 1.99 7/10: no INR- warfarin 12.5 mg po daily at 0200 7/11: INR - 2.97- warfarin held 7/12: INR - 1.92 - warfarin 10 mg 7/13: INR - 1.95 - warfarin 10 mg 7/14: INR - 2.56 - warfarin 5 mg 7/15: INR - 2.73 - warfarin 5 mg 7/16: INR - 2.39   Goal of Therapy:  INR 2-3 Monitor platelets by anticoagulation protocol: Yes   Plan:  INR remains therapeutic, decreased somewhat. Will order Coumadin 7.5 mg daily and f/u AM INR.  Pharmacy will continue to follow.   Napoleon Form, [place] Clinical Pharmacist 12/17/2015,9:02 AM

## 2015-12-17 NOTE — Progress Notes (Signed)
Eating dinner

## 2015-12-17 NOTE — Progress Notes (Signed)
D: 1:1 sitter at bedside. Patient is hyper-verbal. Disorganized thought process. Flight of ideas. Speech loud and pressured. Religiously preoccupied. Labile. Difficult to re-direct. A: Support offered. Safety maintained, Redirection given. Simple instructions given,  Scheduled medications administered. R: Remains hyperverbal and

## 2015-12-17 NOTE — [facility] Group Notes (Signed)
[facility] LCSW Group Therapy  12/17/2015 1:29 PM  Type of Therapy:  Group Therapy  Participation Level:  Did Not Attend  Modes of Intervention:  Discussion, Education, Socialization and Support  Summary of Progress/Problems: Mindfulness: Patient discussed mindfulness and relaxing techniques and why they are beneficial. Pt discussed ways to incorporate mindfulness in their lives. Pt practiced a mindfulness techique and discussed how it made them feel.   Sadaf Przybysz L [place] MSW, LCSWA  12/17/2015, 1:29 PM  

## 2015-12-17 NOTE — Progress Notes (Signed)
In dayroom on green hall.

## 2015-12-17 NOTE — Progress Notes (Signed)
asleep
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
## 2015-12-17 NOTE — Progress Notes (Signed)
agitated

## 2015-12-17 NOTE — Progress Notes (Signed)
Remains on 1:1.  Continues to exhibit manic behavior.  Speech is loud an rapid.  Flight of ideas noted.  Stated at one point "there are snakes in the sink and thy spit on me"  Argumentative at times.  Requires frequent redirection.  Started pulling curtains down from bathroom door, had to be told not to do that.  Started to explain that he was not pulling them down.

## 2015-12-17 NOTE — Progress Notes (Signed)
Up in dayroom on green hall.

## 2015-12-18 ENCOUNTER — Inpatient Hospital Stay: Payer: 59

## 2015-12-18 LAB — GLUCOSE, CAPILLARY
GLUCOSE-CAPILLARY: 58 mg/dL — AB (ref 65–99)
GLUCOSE-CAPILLARY: 72 mg/dL (ref 65–99)
GLUCOSE-CAPILLARY: 185 mg/dL — AB (ref 65–99)
GLUCOSE-CAPILLARY: 138 mg/dL — AB (ref 65–99)
Glucose-Capillary: 138 mg/dL — ABNORMAL HIGH (ref 65–99)

## 2015-12-18 LAB — CARBAMAZEPINE LEVEL, TOTAL: Carbamazepine Lvl: 5.2 ug/mL (ref 4.0–12.0)

## 2015-12-18 LAB — PROTIME-INR
INR: 2.22
PROTHROMBIN TIME: 24.4 s — AB (ref 11.4–15.0)

## 2015-12-18 MED ORDER — GLUCAGON HCL RDNA (DIAGNOSTIC) 1 MG IJ SOLR
1.0000 mg | Freq: Once | INTRAMUSCULAR | Status: AC | PRN
  Administered 2015-12-18: 1 mg via INTRAVENOUS
  Filled 2015-12-18: qty 1

## 2015-12-18 MED ORDER — INSULIN GLARGINE 100 UNIT/ML ~~LOC~~ SOLN
45.0000 [IU] | Freq: Every day | SUBCUTANEOUS | Status: DC
  Administered 2015-12-18 – 2015-12-31 (×14): 45 [IU] via SUBCUTANEOUS
  Filled 2015-12-18 (×15): qty 0.45
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
## 2015-12-18 NOTE — [facility] Group Notes (Signed)
[facility] Group Notes:  (Nursing/MHT/Case Management/Adjunct)  Date:  12/18/2015  Time:  4:21 AM  Type of Therapy:  Psychoeducational Skills  Participation Level:  Active  Participation Quality:  Appropriate  Affect:  Appropriate  Cognitive:  Appropriate  Insight:  Appropriate and Good  Engagement in Group:  Engaged  Modes of Intervention:  Discussion, Socialization and Support  Summary of Progress/Problems:  [name] 12/18/2015, 4:21 AM
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
## 2015-12-18 NOTE — Progress Notes (Signed)
CBG: 58  Treatment: Glucagon IM 1 mg  Symptoms: Pale and Sweaty  Follow-up CBG: Time:0712 CBG Result:72  Possible Reasons for Event: Inadequate meal intake and Change in activity  Comments/MD notified: Dr. Weber Cooks notified. Doctor gave order for 1mg  IM Glucagon as pt is NPO.     Laverle Patter Keyanah Kozicki

## 2015-12-18 NOTE — Progress Notes (Signed)
Perseverating, FOI, Loose association (Tangential Type), refused Temazepam 60 mg, Pharmacy affirmed dosage, attempted to reach his wife x3, "Call my wife, if she says take the medication, I will ..." Manipulative in his behaviors, 60 mg of Temazepam wasted in the Pysix and flushed into the sink with Ms Sharol Harness, RN as a witness.

## 2015-12-18 NOTE — BHH Group Notes (Signed)
[facility] LCSW Group Therapy   12/18/2015 9:30am Type of Therapy: Group Therapy   Participation Level: Active   Participation Quality: Attentive, Sharing and Supportive   Affect:Appropriate  Cognitive: Alert and Oriented   Insight: Developing/Improving and Engaged   Engagement in Therapy: Developing/Improving and Engaged   Modes of Intervention: Clarification, Confrontation, Discussion, Education, Exploration,  Limit-setting, Orientation, Problem-solving, Rapport Building, Art therapist, Socialization and Support   Summary of Progress/Problems: Pt identified obstacles faced currently and processed barriers involved in overcoming these obstacles. Pt identified steps necessary for overcoming these obstacles and explored motivation (internal and external) for facing these difficulties head on. Pt further identified one area of concern in their lives and chose a goal to focus on for today. Pt shared that the pt is from [place] will not be returning there upon discharge.  Pt shared that the pt has chosen to seek a better relationship with his wife as a goal and that the pt's wife was concerned her worsening symptoms of of the pt's bi-polar diagnosis and about never being able to accomplish their goal of a strengthened relationship.  Pt further shared that one barrier to the pt's goal is symptoms the pt connects with stress which is a trigger and other triggering events.  Pt was more polite and cooperative with the CSW than in previous sessions and with other group members and more focused and attentive to the topics discussed, as well as the sharing of others. Pt shared less extensively during group, as evidenced by decreased sharing, not sharing at length when prompted and re-directed and pt presents a happy and increasingly energetic, as compared to previous sessions.   Cameron [name]. Jinna Weinman, LCSWA, LCAS

## 2015-12-18 NOTE — Progress Notes (Signed)
1005 Dr. Weber Cooks in to talk with pt.   Pt refused ECT today.  Reported call to floor.  Spoke with GIGI.  Roselyn Reef here to transport pt back to room.

## 2015-12-18 NOTE — BHH Group Notes (Signed)
[facility] LCSW Aftercare Discharge Planning Group Note   12/18/2015 1 PM  ?  Participation Quality: Alert, Appropriate and Oriented   Mood/Affect: Depressed and Flat    Thoughts of Suicide: Pt denies SI/HI   Will you contract for safety? Yes   Current AVH: Pt denies   Plan for Discharge/Comments: Pt attended discharge planning group and actively participated in group. CSW provided pt with today's workbook. Pt presented as manic but has improved AEB decreased sharing, and is easily redirectable.  Pt has no plan for D/C.  Transportation Means: Pt reports access to transportation   Supports: No supports mentioned at this time     [name]. Uzma Hellmer, MSW, LCSWA, LCAS

## 2015-12-18 NOTE — Progress Notes (Signed)
Patient with depressed affect, irritable and angry behavior. Patient hyperverbal and hyper religious. No SI/HI/AVH at this time. Patient NPO for ECT this morning. Patient resistant to plan of care. Patient stalls and refuses am blood pressure med, throwing med cup on floor, insisting writer listen to him rant. Patient is tangential and angry. Patient to remain on 1:1 of staff. Patient reports he spoke with pastor about ECT. Patient transfer to ECT in wheelchair with security. Patient meets with ECT doctor and refuses ECT. Patient returns to unit, blood eats am meal and takes am meds. No S/S of hypo/hyperglycemia. Safety maintained.

## 2015-12-18 NOTE — BHH Group Notes (Signed)
[facility] Group Notes:  (Nursing/MHT/Case Management/Adjunct)  Date:  12/18/2015  Time:  3:31 PM  Type of Therapy:  Psychoeducational Skills  Participation Level:  Active  Participation Quality:  Appropriate  Affect:  Appropriate  Cognitive:  Appropriate  Insight:  Appropriate  Engagement in Group:  outsisde time played basketball  Modes of Intervention:  Socialization  Summary of Progress/Problems:  [name] Leach 12/18/2015, 3:31 PM

## 2015-12-18 NOTE — Plan of Care (Signed)
Problem: Education: Goal: Will be free of psychotic symptoms Outcome: Not Progressing Pt very manic this evening. Hyperverbal, flight of ideas, demanding, yelling, religious preoccupation, blaming others, very difficult to redirect.

## 2015-12-18 NOTE — Progress Notes (Signed)
ANTICOAGULATION CONSULT NOTE - Follow Up Consult  Pharmacy Consult for LMWH/VKA Indication: VTE treatment  No Known Allergies  Patient Measurements: Height: 6' (182.9 cm) Weight: (!) 317 lb (143.79 kg) IBW/kg (Calculated) : 77.6   Vital Signs: Temp: 98.3 F (36.8 C) (07/17 0713) Temp Source: Oral (07/17 0713) BP: 110/85 mmHg (07/17 0713) Pulse Rate: 103 (07/17 0713)  Labs:  Recent Labs  12/16/15 1059 12/17/15 0809 12/18/15 0717  HGB  --  12.6*  --   HCT  --  36.6*  --   PLT  --  283  --   LABPROT 28.5* 25.8* 24.4*  INR 2.73 2.39 2.22    Estimated Creatinine Clearance: 80.4 mL/min (by C-G formula based on Cr of 1.6).   Medical History: Past Medical History  Diagnosis Date  . Bipolar 1 disorder ([employer])   . Diabetes mellitus without complication ([place])   . Hypertension   . Hypercholesteremia   . CKD (chronic kidney disease), stage III   . DVT (deep venous thrombosis) (HCC)     Medications:  Warfarin 10 mg po daily Lovenox 145 mg subq q12h  Assessment: 47 yom admitted to ED [facility] with manic symptoms. Recently discharged from [facility] with DVT/PE, had been bridging LMWH and VKA.  INR History: 7/9: INR - 1.99 7/10: no INR- warfarin 12.5 mg po daily at 0200 7/11: INR - 2.97- warfarin held 7/12: INR - 1.92 - warfarin 10 mg 7/13: INR - 1.95 - warfarin 10 mg 7/14: INR - 2.56 - warfarin 5 mg 7/15: INR - 2.73 - warfarin 7.5mg  7/16: INR - 2.22  Goal of Therapy:  INR 2-3 Monitor platelets by anticoagulation protocol: Yes   Plan:  Will continue warfarin 7.5mg  daily and recheck INR in AM.  Pharmacy will continue to follow.   [place] Clinical Pharmacist 12/18/2015,10:48 AM

## 2015-12-18 NOTE — Progress Notes (Signed)
Inpatient Diabetes Program Recommendations  AACE/ADA: New Consensus Statement on Inpatient Glycemic Control (2015)  Target Ranges:  Prepandial:   less than 140 mg/dL      Peak postprandial:   less than 180 mg/dL (1-2 hours)      Critically ill patients:  140 - 180 mg/dL   Lab Results  Component Value Date   GLUCAP 72 12/18/2015   HGBA1C 8.3* 12/04/2015    Review of Glycemic ControlResults for LAUREL, SIMARD (MRN [id number]) as of 12/18/2015 11:20  Ref. Range 12/17/2015 16:29 12/17/2015 21:03 12/18/2015 06:34 12/18/2015 07:12  Glucose-Capillary Latest Ref Range: 65-99 mg/dL 102 (H) 115 (H) 58 (L) 72   Diabetes history: Type 2 diabetes Outpatient Diabetes medications: Lantus 60 units daily, Tanzeum 50 mg daily, Invokana 100 mg daily Current orders for Inpatient glycemic control:  Lantus 60 units daily, Novolog sensitive tid with meals and HS  Inpatient Diabetes Program Recommendations:    Note low fasting blood sugar this morning.  Please consider reducing Lantus to 45 units daily.  Thanks, Adah Perl, RN, BC-ADM Inpatient Diabetes Coordinator Pager [phone] (8a-5p)

## 2015-12-18 NOTE — Consult Note (Signed)
  ECT: Patient had given me the impression he would consent, but then when we brought him to ECT he refused to consent to treatment. Treatment cancelled, patient returned to ward. Will continue to try to converse with him.

## 2015-12-19 LAB — PROTIME-INR
INR: 2.53
Prothrombin Time: 26.9 seconds — ABNORMAL HIGH (ref 11.4–15.0)

## 2015-12-19 LAB — GLUCOSE, CAPILLARY
GLUCOSE-CAPILLARY: 131 mg/dL — AB (ref 65–99)
GLUCOSE-CAPILLARY: 91 mg/dL (ref 65–99)
Glucose-Capillary: 96 mg/dL (ref 65–99)
Glucose-Capillary: 111 mg/dL — ABNORMAL HIGH (ref 65–99)

## 2015-12-19 LAB — LAMOTRIGINE LEVEL: LAMOTRIGINE LVL: 2.8 ug/mL (ref 2.0–20.0)
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
## 2015-12-19 NOTE — BHH Group Notes (Signed)
[facility] LCSW Group Therapy   12/19/2015 9:30am  Type of Therapy: Group Therapy   Participation Level: Active   Participation Quality: Attentive, Sharing and Supportive   Affect: Appropriate  Cognitive: Alert and Oriented   Insight: Developing/Improving and Engaged   Engagement in Therapy: Developing/Improving and Engaged   Modes of Intervention: Clarification, Confrontation, Discussion, Education, Exploration,  Limit-setting, Orientation, Problem-solving, Rapport Building, Art therapist, Socialization and Support  Summary of Progress/Problems: The topic for group therapy was feelings about diagnosis. Pt actively participated in group discussion on their past and current diagnosis and how they feel towards this. Pt also identified how society and family members judge them, based on their diagnosis as well as stereotypes and stigmas. Pt shared the pt's diagnosis is bi-polar and that this was something the pt has been aware of for quite some time.  Pt shared the pt intended to work with doctors to treat the pt's diagnosis.  Pt was polite and cooperative with the CSW and other group members and focused and attentive to the topics discussed and the sharing of others. Pt seems to have made great improvement during group, as evidenced by decreased sharing, sharing, but not at [place] when prompted and pt presents as happy and increasingly calm, as compared to previous sessions.       [name]. Abigail Teall, LCSWA, LCAS

## 2015-12-19 NOTE — Consult Note (Signed)
  Psychiatry ECT: Patient seen again today. Primary psychiatrist told me that he had spoken about now being agreeable to ECT. I talked with him this morning and he tells me that he is now agreeable to ECT treatment. Patient continues to be manic although it looks like it might be starting to tone down a little bit. He clearly understands the nature of ECT and the reason for recommendation. We will go ahead and keep him on the schedule and I will put in orders and we will once again try for ECT tomorrow.

## 2015-12-19 NOTE — Progress Notes (Signed)
D: Patient no ADLs preformed this am A:structions given on medication , verbalize understanding. Writer addressing issues R: Voice no other concerns , staff continue tor monitor .

## 2015-12-19 NOTE — Progress Notes (Signed)
D: Compliant with AM medications  A: Encourage  appropriate behavior. Positive strokes given. For efforts made.  R: Continue tor monitor.

## 2015-12-19 NOTE — Progress Notes (Signed)
D: Patient continues to be delusional and tangential. He denies SI/HI/AVH. He has been visible in the milieu interacting with peers. He states he wants to talk to a chaplin because he's hearing noises that are "freaking me out". He is preoccupied with ECT tomorrow. States he's getting "zapped" but seems excited about it.  A: Medication was given with education. Encouragement was provided.  R: Patient was compliant with all medication and mouth checks were done. He has remained cooperative. Safety maintained with 15 min checks and sitter.

## 2015-12-19 NOTE — Progress Notes (Signed)
D: Remains in dayroom  Drinking water  Affect pleasant ron approach but remains hyper verbal and delusional  ,partient gave writer  Pill that he refused to take from last  Night. A: Encourage compliance with   Unit programming  and  No cheek ing of medication R: continue to monitor.

## 2015-12-19 NOTE — [facility] Group Notes (Signed)
[place] Group Notes:  (Nursing/MHT/Case Management/Adjunct)  Date:  12/19/2015  Time:  2:26 AM  Type of Therapy:  Group Therapy  Participation Level:  Active  Participation Quality:  Appropriate  Affect:  Appropriate  Cognitive:  Appropriate and Oriented  Insight:  Improving  Engagement in Group:  Developing/Improving  Modes of Intervention:  Activity  Summary of Progress/Problems: Group was held outside. Pt came outside with his sitter. Pt was appropriate,  his tone of voice was calm and pleasant. He spent the group playing basketball with a fellow pt.   [name] 12/19/2015, 2:26 AM

## 2015-12-19 NOTE — Progress Notes (Signed)
ANTICOAGULATION CONSULT NOTE - Follow Up Consult  Pharmacy Consult for LMWH/VKA Indication: VTE treatment  No Known Allergies  Patient Measurements: Height: 6' (182.9 cm) Weight: (!) 317 lb (143.79 kg) IBW/kg (Calculated) : 77.6  Vital Signs: Temp: 98.6 F (37 C) (07/18 0700) Temp Source: Oral (07/18 0700) BP: 119/69 mmHg (07/18 0700) Pulse Rate: 109 (07/18 0700)  Labs:  Recent Labs  12/17/15 0809 12/18/15 0717 12/19/15 0752  HGB 12.6*  --   --   HCT 36.6*  --   --   PLT 283  --   --   LABPROT 25.8* 24.4* 26.9*  INR 2.39 2.22 2.53   Estimated Creatinine Clearance: 80.4 mL/min (by C-G formula based on Cr of 1.6).  Medical History: Past Medical History  Diagnosis Date  . Bipolar 1 disorder ([place])   . Diabetes mellitus without complication ([place])   . Hypertension   . Hypercholesteremia   . CKD (chronic kidney disease), stage III   . DVT (deep venous thrombosis) ([facility])    Medications:  Warfarin 10 mg po daily  Assessment: 37 yom admitted to ED [facility] with manic symptoms. Recently discharged from [facility] with DVT/PE, had been bridging LMWH and VKA prior to admission.  INR History: 7/9: INR - 1.99 7/10: no INR- warfarin 12.5 mg po daily at 0200 7/11: INR - 2.97- warfarin held 7/12: INR - 1.92 - warfarin 10 mg 7/13: INR - 1.95 - warfarin 10 mg 7/14: INR - 2.56 - warfarin 5 mg 7/15: INR - 2.73 - warfarin 5 mg 7/16: INR - 2.39 - warfarin 7.5 mg 7/17: INR - 2.22 - warfarin 7.5 mg 7/18: INR - 2.53 -   Goal of Therapy:  INR 2-3 Monitor platelets by anticoagulation protocol: Yes   Plan:  INR remains therapeutic, will continue warfarin 7.5mg  daily and recheck INR in AM.  Pharmacy will continue to follow.   Lenis Noon, PharmD, BCPS Clinical Pharmacist 12/19/2015,9:21 AM

## 2015-12-19 NOTE — Progress Notes (Signed)
D: Remains  hyper verbal . And Delusion disorganized. A Lower legs red and swollen  Noted to have bandage on Bright red.  R: Patient  Remains  Confused , staff continue to monitor

## 2015-12-19 NOTE — Plan of Care (Signed)
Problem: Education: Goal: Verbalization of understanding the information provided will improve Outcome: Not Progressing Psychotic still, depressive symptoms are still ongoing, prevalent dependent and attention seeking, staff-splitting behaviors; highly unpredictable for disruptive behaviors and required 1:1 Safety Sitter to maintain a safe milieu

## 2015-12-19 NOTE — Tx Team (Signed)
Interdisciplinary Treatment Plan Update (Adult)        Date: 12/19/2015   Time Reviewed: 9:30 AM   Progress in Treatment: Improving  Attending groups: Continuing to assess, patient new to milieu  Participating in groups: Continuing to assess, patient new to milieu  Taking medication as prescribed: Yes  Tolerating medication: Yes  Family/Significant other contact made:Yes, CSW spoke to the pt's wife Oluwademilade Mckiver at ph: [phone] option 4, Ext [phone] Patient understands diagnosis: Yes  Discussing patient identified problems/goals with staff: Yes  Medical problems stabilized or resolved: Yes  Denies suicidal/homicidal ideation: Yes  Issues/concerns per patient self-inventory: Yes  Other:   New problem(s) identified: N/A   Discharge Plan or Barriers: Pt will discharge home to [facility] to live with his family and will follow up with [facility] for medication management and with [facility] for therapy   Reason for Continuation of Hospitalization:   Depression   Anxiety   Medication Stabilization   Comments: N/A   Estimated length of stay: 3-5 days     Patient is a 50 year old male with a history of bipolar disorder.  Pt's hief complaint. "I committed myself."   History of present illness. Information was obtained from the patient and the chart. The patient has a long history of bipolar Disorder with multiple psychiatric hospitalizations he has been stable since 2009 on a combination of Abilify, Seroquel, Tegretol, Lamictal, and Restoril in the care of dr. [facility], his primary psychiatrist. The patient has been compliant with medications and doctors appointments. On July 2 he was admitted to medical floor for DVT and pulmonary embolism. At that time some of his medications were discontinued or the doses were lowered. The patient started developing manic symptoms. He became insomniac, hyperactive, hyperverbal and possibly psychotic. He called exterminators to his  house twice to deal with critters that he could see on the walls. The wife suggested that he comes to the hospital and the patient agreed. He is very proud of himself that he committed himself. He is very proud of the fact the years he developed good coping skills. This includes good treatment compliance as well as ability to listen and not to interrupt. He denies symptoms of anxiety. There are no alcohol or drugs involved.   Past psychiatric history. Long history of bipolar mania. He used to be hospitalized every 2 years for severe manic episode that it will require extended hospitalizations and ECT treatments. His last episode was in 2009. She received ECT treatment according to the patient flipped him to depression.    Family psychiatric history. Daughter with bipolar and ADHD.   Social history. He experienced several stressors in the pas ttwo years, sickness of his mother and job loss, that did not result in exacerbation of his illness. Recent hospital admission were some omissions in his medication regimen were made brought him to the hospital again. 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 . Patient lives in [place]. Patient will benefit from crisis stabilization, medication evaluation, group therapy, and psycho education in addition to case management for discharge planning. Patient and CSW reviewed pt's identified goals and treatment plan. Pt verbalized understanding and agreed to treatment plan.    Review of initial/current patient goals per problem list:  1. Goal(s): Patient will participate in aftercare plan   Met: No Target date: 3-5 days post admission date   As evidenced by: Patient will participate within aftercare plan AEB aftercare provider and housing plan  at discharge being identified.   7/12: CSW assessing for appropriate contacts  7/18: Pt will discharge home to [facility] to live with his family and will follow up with [facility] for  medication management and with [facility] for therapy   2. . Goal(s): Patient will demonstrate decreased signs of psychosis  * Met: No * Target date: 3-5 days post admission date  * As evidenced by: Patient will demonstrate decreased frequency of AVH or return to baseline function   7/12: Goal progressing.  7/18: Goal progressing.   3. Goal (s): Patient will demonstrate decreased signs of mania  * Met: No * Target date: 3-5 days post admission date  * As evidenced by: Patient demonstrate decreased signs of mania AEB decreased mood instability and demonstration of stable mood   7/12: Goal progressing.  7/18: Goal progressing.  Attendees:  Patient:  Family:  Physician: Dr. Bary Leriche, MD    12/19/2015 9:30 AM  Nursing: Polly Cobia, RN     12/19/2015 9:30 AM  Clinical Social Worker: Marylou Flesher, [place]  12/19/2015 9:30 AM  Clinical Social Worker: Dossie Arbour, [place]   12/19/2015 9:30 AM  Other: , NP       12/19/2015 9:30 AM  Other:        12/19/2015 9:30 AM  Other:        12/19/2015 9:30 AM   [name]. Zion Ta, LCSWA, LCAS  12/19/15

## 2015-12-19 NOTE — Progress Notes (Addendum)
Nursing 1:1 note  D:Pt observed .  In roomNo distress noted. Pt used the bathroom at 1100A: 1:1 observation continues for safety  R: pt remains safe

## 2015-12-19 NOTE — Plan of Care (Signed)
Problem: Coping: Goal: Ability to cope will improve Outcome: Not Progressing  No Attendance unit programming

## 2015-12-19 NOTE — [facility] Group Notes (Signed)
Goals Group Date/Time: 12/19/15 9am Type of Therapy and Topic: Group Therapy: Goals Group: SMART Goals   Participation Level: Moderate  Description of Group:    The purpose of a daily goals group is to assist and guide patients in setting recovery/wellness-related goals. The objective is to set goals as they relate to the crisis in which they were admitted. Patients will be using SMART goal modalities to set measurable goals. Characteristics of realistic goals will be discussed and patients will be assisted in setting and processing how one will reach their goal. Facilitator will also assist patients in applying interventions and coping skills learned in psycho-education groups to the SMART goal and process how one will achieve defined goal.   Therapeutic Goals:   -Patients will develop and document one goal related to or their crisis in which brought them into treatment.  -Patients will be guided by LCSW using SMART goal setting modality in how to set a measurable, attainable, realistic and time sensitive goal.  -Patients will process barriers in reaching goal.  -Patients will process interventions in how to overcome and successful in reaching goal.   Patient's Goal: Pt shared that the pt's goal is to discharge to [facility] and follow up with his psychiatrist in [place].  Pt reports he plans to focus on improving his health.  Pt does hope to think of a second and thirs attainable SMART goal. Pt was polite and cooperative with the CSW and other group members and focused and attentive to the topics discussed and the sharing of others.  Pt seems to have made great improvement during group, as evidenced by a pleasant affect, answering when prompted but being easily re-directable and pt presents as less manic than in previous sessions. Therapeutic Modalities:  Motivational Interviewing  Art gallery manager  SMART goals setting   [name]. Jame Seelig, LCSWA,  LCAS

## 2015-12-19 NOTE — Progress Notes (Signed)
DPatient able to go outside with his peers, Appropriate behavior note A: Interacting with peers and staff . Staff continue  With 1:! aassignment R:Remains loose with thought process .

## 2015-12-19 NOTE — Progress Notes (Signed)
D: Resting in bed with sitter at bedside  A: Positive strokes given for efforts made  R: Continue to monitor.

## 2015-12-19 NOTE — Progress Notes (Signed)
D: Patient stated slept good last night .Stated appetite is good and energy level  Is normal. Stated concentration is good . Stated on Depression scale 0 hopeless 0 and anxiety 0 .( low 0-10 high) Denies suicidal  homicidal ideations  .Constantly talking to himself  Remains on 1:1 No pain concerns . Appropriate ADL'S. Interacting with peers and staff.  A: Encourage patient participation with unit programming . Instruction  Given on  Medication , verbalize understanding. R: Voice no other concerns. Staff continue to monitor

## 2015-12-19 NOTE — Plan of Care (Signed)
Problem: Health Behavior/Discharge Planning: Goal: Compliance with treatment plan for underlying cause of condition will improve Outcome: Progressing Patient was compliant with all medications this evening.

## 2015-12-19 NOTE — [facility] Group Notes (Signed)
[place] Group Notes:  (Nursing/MHT/Case Management/Adjunct)  Date:  12/19/2015  Time:  11:17 PM  Type of Therapy:  Psychoeducational Skills  Participation Level:  Active  Participation Quality:  Appropriate  Affect:  Appropriate  Cognitive:  Appropriate  Insight:  Appropriate  Engagement in Group:  Engaged  Modes of Intervention:  Discussion, Education and Support  Summary of Progress/Problems:  Cameron [name] 12/19/2015, 11:17 PM

## 2015-12-19 NOTE — Progress Notes (Signed)
D: Patient  Remains to have 1:1 this shift . Noted in chair this am  With no reason for being in chair. Able to follow direction and  Get out . Hyper verbal , Delusion with staff trying to kill him . A: Encourage  appropriate behavior. Positive strokes given. For efforts made. R: Continue tor monitor.

## 2015-12-19 NOTE — Progress Notes (Addendum)
   D:Patient denies SI/HI at this time. Pt noted cooperative, and no distress noted.  A Encourage appropriate behavior  R:Voice no concerns

## 2015-12-20 ENCOUNTER — Encounter: Payer: Self-pay | Admitting: *Deleted

## 2015-12-20 ENCOUNTER — Inpatient Hospital Stay: Payer: 59

## 2015-12-20 LAB — GLUCOSE, CAPILLARY
GLUCOSE-CAPILLARY: 89 mg/dL (ref 65–99)
GLUCOSE-CAPILLARY: 102 mg/dL — AB (ref 65–99)
Glucose-Capillary: 71 mg/dL (ref 65–99)
Glucose-Capillary: 105 mg/dL — ABNORMAL HIGH (ref 65–99)

## 2015-12-20 LAB — PROTIME-INR
INR: 2.75
PROTHROMBIN TIME: 28.7 s — AB (ref 11.4–15.0)

## 2015-12-20 MED ORDER — SODIUM CHLORIDE 0.9 % IV SOLN: 250.0000 mL | Freq: Once | INTRAVENOUS | Status: DC

## 2015-12-20 MED ORDER — MIDAZOLAM HCL 2 MG/2ML IJ SOLN: 2.0000 mg | Freq: Once | INTRAMUSCULAR | Status: DC

## 2015-12-20 NOTE — Progress Notes (Signed)
Patient continues to be 1:1 with sitter. Patient in dayroom with peers.  Patient continues to be hyper verbal.

## 2015-12-20 NOTE — Progress Notes (Signed)
Patient calm and cooperative. Just received medication. Safety maintained with sitter 1:1.

## 2015-12-20 NOTE — Progress Notes (Signed)
Patient walking hallways. Still 1:1 with sitter. Voices no complaints or concerns.  Patient continues to be hyper verbal.

## 2015-12-20 NOTE — Progress Notes (Signed)
Pt refusing ECT treatment today.  Dr Weber Cooks in to see pt.  Pt cont to refuse ECT.  Behavior med notified spoke to [place] of pt refusal of ECT and he would be transferred back via W/C with sitter

## 2015-12-20 NOTE — Progress Notes (Signed)
Patient calm and cooperative. Safety maintained with sitter 1:1

## 2015-12-20 NOTE — Progress Notes (Signed)
Patient asleep in recliner. Remains on 1:1 with sitter.

## 2015-12-20 NOTE — Progress Notes (Signed)
Patient ambulating hallways.  Patient still 1:1 with sitter.  Patient complains of headache and received mediation (see EMAR).  Patient continues to be hyper verbal.

## 2015-12-20 NOTE — Progress Notes (Signed)
D: Patient continues to be delusional and hyperverbal. He denies SI/HI/AVH. He refused ECT and hyper verbalization is increasing. A: Medication was given with education. Encouragement was provided and patient responded positively.  R: Patient was compliant with all medication and mouth checks were done. He has remained cooperative. Safety maintained with 15 min checks and patient continues to have 1:1 sitter.
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
## 2015-12-20 NOTE — [facility] Group Notes (Signed)
[place] Group Notes:  (Nursing/MHT/Case Management/Adjunct)  Date:  12/20/2015  Time:  4:00 PM  Type of Therapy:  Psychoeducational Skills  Participation Level:  Active  Participation Quality:  Attentive and Redirectable  Affect:  Tearful  Cognitive:  Disorganized  Insight:  None  Engagement in Group:  Distracting  Modes of Intervention:  Limit-setting  Summary of Progress/Problems:  Cameron [name] 12/20/2015, 4:00 PM

## 2015-12-20 NOTE — Progress Notes (Signed)
ANTICOAGULATION CONSULT NOTE - Follow Up Consult  Pharmacy Consult for LMWH/VKA Indication: VTE treatment  Allergies  Allergen Reactions  . Asa [Aspirin] Other (See Comments)    Patient tolerates LOW DOSE ASPIRIN.    Patient Measurements: Height: 6' (182.9 cm) Weight: (!) 303 lb (137.44 kg) IBW/kg (Calculated) : 77.6  Vital Signs: Temp: 98.5 F (36.9 C) (07/19 1034) Temp Source: Oral (07/19 1034) BP: 115/76 mmHg (07/19 1034) Pulse Rate: 93 (07/19 1034)  Labs:  Recent Labs  12/18/15 0717 12/19/15 0752 12/20/15 0719  LABPROT 24.4* 26.9* 28.7*  INR 2.22 2.53 2.75   Estimated Creatinine Clearance: 78.4 mL/min (by C-G formula based on Cr of 1.6).  Medical History: Past Medical History  Diagnosis Date  . Bipolar 1 disorder ([place])   . Diabetes mellitus without complication ([place])   . Hypertension   . Hypercholesteremia   . CKD (chronic kidney disease), stage III   . DVT (deep venous thrombosis) (H[facility])    Medications:  Warfarin 10 mg po daily  Assessment: 36 yom admitted to ED BHU with manic symptoms. Recently discharged from [facility] with DVT/PE, had been bridging LMWH and VKA prior to admission.  INR History: 7/9: INR - 1.99 7/10: no INR- warfarin 12.5 mg po daily at 0200 7/11: INR - 2.97- warfarin held 7/12: INR - 1.92 - warfarin 10 mg 7/13: INR - 1.95 - warfarin 10 mg 7/14: INR - 2.56 - warfarin 5 mg 7/15: INR - 2.73 - warfarin 5 mg 7/16: INR - 2.39 - warfarin 7.5 mg 7/17: INR - 2.22 - warfarin 7.5 mg 7/18: INR - 2.53 - warfarin 7.5 mg 7/19: INR - 2.75  Goal of Therapy:  INR 2-3 Monitor platelets by anticoagulation protocol: Yes   Plan:  INR remains therapeutic, will continue warfarin 7.5mg  daily and recheck INR in AM.  Pharmacy will continue to follow.   [place] Clinical Pharmacist 12/20/2015,12:27 PM

## 2015-12-20 NOTE — Progress Notes (Signed)
Patient in room.  Patient still 1:1 with sitter.  Patient continues to be hyper verbal.

## 2015-12-20 NOTE — Progress Notes (Signed)
Patient returned to floor.  Patient refused ECT.  Patient continues to be more hyper verbal.  Patient still 1:1 with sitter.

## 2015-12-20 NOTE — Progress Notes (Signed)
Patient walking hallways. Still 1:1 with sitter. Voice no complaints or concerns.  Patient continues to be hyper verbal.

## 2015-12-20 NOTE — Progress Notes (Signed)
Patient awake walking in halls with sitter. Remains on 1:1 sitter.

## 2015-12-20 NOTE — Progress Notes (Signed)
Patient in dayroom.  Patient continues to be 1:1 with staff.  Patient's hyper verbalization continues to increase.  Patient voices no complaints or concerns.

## 2015-12-20 NOTE — Progress Notes (Signed)
Patient in recliner in room. Still 1:1 with sitter. Voice no complaints or concerns.  Patient continues to be hyper verbal.

## 2015-12-20 NOTE — Progress Notes (Signed)
Patient walking hallways. Still 1:1 with sitter. Voice no complaints or concerns.  Patient's hyper verbalization continues to increase.

## 2015-12-20 NOTE — Progress Notes (Signed)
Patient in day room interacting with peers. Calm in cooperative. Remains on 1:1 sitter case.

## 2015-12-20 NOTE — Progress Notes (Signed)
Patient left floor for a procedure.  Still 1:1 with sitter.  Patient continues to be hyper verbal.

## 2015-12-20 NOTE — Progress Notes (Signed)
Patient walking in hallways.  Patient continues to be 1:1 with staff.  Patient voices no complaints or concerns at this time.  Patient continues to be excessively hyper verbal.

## 2015-12-20 NOTE — Progress Notes (Signed)
Patient in bed asleep. Safety maintained with sitter.

## 2015-12-20 NOTE — Progress Notes (Signed)
Patient off floor for procedure 

## 2015-12-20 NOTE — Plan of Care (Signed)
Problem: Coping: Goal: Demonstration of participation in decision-making regarding own care will improve Outcome: Not Progressing Patient refused ECT treatment.  Patient continues to be hyper verbal and not able to make logical decisions regarding care.

## 2015-12-20 NOTE — Progress Notes (Signed)
Patient in medication room.  Patient still 1:1 with sitter.  Patient bandage changed and wound cleaned on left lower leg.  Patient continues to be hyper verbal.

## 2015-12-20 NOTE — Progress Notes (Signed)
Patient in room. Calm and cooperative. Remains on 1:1 sitter.

## 2015-12-20 NOTE — Progress Notes (Signed)
Patient asleep in recliner. Remains on 1:1 sitter.

## 2015-12-20 NOTE — Consult Note (Signed)
  ECT: Patient was brought to ECT preparation area this morning after having given verbal consent on more than one occasion yesterday for ECT. Had given nursing downstairs this morning also the indication that he would give consent for ECT. After speaking with nursing in the preparation area the patient abruptly refused to sign consent. He tells me that he absolutely will not consent to treatment. He appears to still be manic and confused in his thinking. Transport back to behavioral health unit.

## 2015-12-21 ENCOUNTER — Other Ambulatory Visit: Payer: Self-pay | Admitting: Psychiatry

## 2015-12-21 LAB — GLUCOSE, CAPILLARY
GLUCOSE-CAPILLARY: 111 mg/dL — AB (ref 65–99)
GLUCOSE-CAPILLARY: 96 mg/dL (ref 65–99)
Glucose-Capillary: 125 mg/dL — ABNORMAL HIGH (ref 65–99)
Glucose-Capillary: 122 mg/dL — ABNORMAL HIGH (ref 65–99)

## 2015-12-21 LAB — PROTIME-INR
INR: 2.99
Prothrombin Time: 30.5 seconds — ABNORMAL HIGH (ref 11.4–15.0)

## 2015-12-21 MED ORDER — WARFARIN SODIUM 4 MG PO TABS
6.5000 mg | ORAL_TABLET | Freq: Every day | ORAL | Status: DC
  Administered 2015-12-21 – 2015-12-22 (×2): 6.5 mg via ORAL
  Filled 2015-12-21 (×2): qty 1

## 2015-12-21 NOTE — Progress Notes (Signed)
Patient still 1:1 with sitter.  Patient in the dayroom.  Patient continues to be hyper verbal.

## 2015-12-21 NOTE — Progress Notes (Signed)
Patient in dayroom.  Patient continues to be 1:1 with sitter.  Patient continues to be hyper verbal.

## 2015-12-21 NOTE — Progress Notes (Addendum)
ANTICOAGULATION CONSULT NOTE - Follow Up Consult  Pharmacy Consult for LMWH/VKA Indication: VTE treatment  Allergies  Allergen Reactions  . Asa [Aspirin] Other (See Comments)    Patient tolerates LOW DOSE ASPIRIN.    Patient Measurements: Height: 6' (182.9 cm) Weight: (!) 303 lb (137.44 kg) IBW/kg (Calculated) : 77.6  Vital Signs: Temp: 98.7 F (37.1 C) (07/20 0500) Temp Source: Oral (07/19 2015) BP: 109/71 mmHg (07/20 0500) Pulse Rate: 90 (07/20 0500)  Labs:  Recent Labs  12/19/15 0752 12/20/15 0719 12/21/15 0646  LABPROT 26.9* 28.7* 30.5*  INR 2.53 2.75 2.99   Estimated Creatinine Clearance: 78.4 mL/min (by C-G formula based on Cr of 1.6).  Medical History: Past Medical History  Diagnosis Date  . Bipolar 1 disorder ([place])   . Diabetes mellitus without complication ([place])   . Hypertension   . Hypercholesteremia   . CKD (chronic kidney disease), stage III   . DVT (deep venous thrombosis) ([facility])    Medications:  Warfarin 10 mg po daily  Assessment: 52 yom admitted to ED BHU with manic symptoms. Recently discharged from [facility] with DVT/PE, had been bridging LMWH and VKA prior to admission.  INR History: 7/9: INR - 1.99 7/10: no INR- warfarin 12.5 mg po daily at 0200 7/11: INR - 2.97- warfarin held 7/12: INR - 1.92 - warfarin 10 mg 7/13: INR - 1.95 - warfarin 10 mg 7/14: INR - 2.56 - warfarin 5 mg 7/15: INR - 2.73 - warfarin 5 mg 7/16: INR - 2.39 - warfarin 7.5 mg 7/17: INR - 2.22 - warfarin 7.5 mg 7/18: INR - 2.53 - warfarin 7.5 mg 7/19: INR - 2.75 - warfarin 7.5 mg 7/20: INR - 2.99  Goal of Therapy:  INR 2-3 Monitor platelets by anticoagulation protocol: Yes   Plan:  INR remains therapeutic.  INR has increased steadily over the past 3 days.  Will transition patient to lower dose of 6.5 mg.    Patient with orders for carbamazepine which interacts with warfarin to decrease the INR.  Will follow INR closely.   Pharmacy will continue to follow.    Loleta Dicker, RPh Clinical Pharmacist 12/21/2015,8:09 AM
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
## 2015-12-21 NOTE — Progress Notes (Signed)
Patient continues to be 1:1 with sitter.  Patient in the dayroom.  Patient continues to be hyper verbal.

## 2015-12-21 NOTE — Plan of Care (Signed)
Problem: Coping: Goal: Ability to interact with others will improve Outcome: Progressing Patient interacting appropriately with peers and staff.  Patient pleasant to peers and staff and continues to be hyper verbal.

## 2015-12-21 NOTE — Progress Notes (Signed)
Patient ambulating in hallway.  Patient continues to be on 1:1 with staff.  Patient continues to be hyper verbal.

## 2015-12-21 NOTE — Tx Team (Deleted)
Initial Interdisciplinary Treatment Plan   PATIENT STRESSORS: Loss of brother Substance abuse   PATIENT STRENGTHS: Ability for insight Average or above average intelligence Communication skills Motivation for treatment/growth Physical Health   PROBLEM LIST: Problem List/Patient Goals Date to be addressed Date deferred Reason deferred Estimated date of resolution  Heroin Addiction      Difficulty coping with loss of brother      " I want to get off drugs"                                           DISCHARGE CRITERIA:  Ability to meet basic life and health needs Motivation to continue treatment in a less acute level of care  PRELIMINARY DISCHARGE PLAN: Attend aftercare/continuing care group Outpatient therapy  PATIENT/FAMIILY INVOLVEMENT: This treatment plan has been presented to and reviewed with the patient, Cameron [name].  The patient and family have been given the opportunity to ask questions and make suggestions.  Cameron [name] 12/21/2015, 4:20 AM

## 2015-12-21 NOTE — Progress Notes (Signed)
Patient continues to be 1:1.  Patient in the dayroom.  Patient continues to be hyper verbal.

## 2015-12-21 NOTE — Progress Notes (Signed)
Patient asleep.  Patient continues to be 1:1 with sitter.

## 2015-12-21 NOTE — Progress Notes (Signed)
D: Patient continues to be delusional and hyperverbal. He denies SI/HI/AVH. A: Medication was given with education. Encouragement was provided and patient responded positively.  R: Patient was compliant with all medication and mouth checks were done. He has remained cooperative. Safety maintained with 15 min checks and patient continues to have 1:1 sitter.

## 2015-12-21 NOTE — Tx Team (Signed)
Interdisciplinary Treatment Plan Update (Adult)  Date:  12/21/2015 Time Reviewed:  1:35 PM  Progress in Treatment: Attending groups: No. Participating in groups:  No. Taking medication as prescribed:  Yes. Tolerating medication:  Yes. Family/Significant othe contact made:  Yes, individual(s) contacted:  wife Patient understands diagnosis:  Yes. Discussing patient identified problems/goals with staff:  Yes. Medical problems stabilized or resolved:  Yes. Denies suicidal/homicidal ideation: Yes. Issues/concerns per patient self-inventory:  Yes. Other:  New problem(s) identified: No, Describe:  NA  Discharge Plan or Barriers: Pt plans to return home and follow up with outpatient.    Reason for Continuation of Hospitalization: Mania Medication stabilization  Comments:[name] continues to be euphoric and disorganized in his thinking  Estimated length of stay: 7 days   New goal(s): NA  Review of initial/current patient goals per problem list:   1.  Goal(s): Patient will participate in aftercare plan * Met:  * Target date: at discharge * As evidenced by: Patient will participate within aftercare plan AEB aftercare provider and housing plan at discharge being identified.  2.  Goal (s): Patient will demonstrate decreased symptoms of mania. * Met: No  *  Target date: at discharge * As evidenced by: Patient will not endorse signs of mania or be deemed stable for discharge by MD.   Attendees: Patient:  Cameron [name] 7/20/20171:35 PM  Family:   7/20/20171:35 PM  Physician:   Dr. Bary Leriche  7/20/20171:35 PM  Nursing:   Elige Radon, RN  7/20/20171:35 PM  Case Manager:   7/20/20171:35 PM  Counselor:   7/20/20171:35 PM  Other:  Wray Kearns, [place] 7/20/20171:35 PM  Other:  Anthoney Harada, [place] 7/20/20171:35 PM  Other:   7/20/20171:35 PM  Other:  7/20/20171:35 PM  Other:  7/20/20171:35 PM  Other:  7/20/20171:35 PM  Other:  7/20/20171:35 PM  Other:  7/20/20171:35 PM   Other:  7/20/20171:35 PM  Other:   7/20/20171:35 PM   Scribe for Treatment Team:   Wray Kearns, MSW, [place]  12/21/2015, 1:35 PM

## 2015-12-21 NOTE — Progress Notes (Signed)
Patient continues to be 1:1 with sitter.  Patient lying down in bed.  Patient continues to be hyper verbal.

## 2015-12-21 NOTE — BHH Group Notes (Signed)
[facility] LCSW Group Therapy  12/21/2015 1:21 PM  Type of Therapy:  Group Therapy  Participation Level:  Did Not Attend  Modes of Intervention:  Discussion, Education, Socialization and Support  Summary of Progress/Problems: Balance in life: Patients will discuss the concept of balance and how it looks and feels to be unbalanced. Pt will identify areas in their life that is unbalanced and ways to become more balanced.    Geronimo MSW, [place]  12/21/2015, 1:21 PM

## 2015-12-21 NOTE — Progress Notes (Signed)
Patient continues to be 1:1 with sitter.  Patient is in dayroom.  Patient continues to be hyper verbal.

## 2015-12-21 NOTE — Progress Notes (Signed)
Patient continues to be 1:1.  Patient eating dinner.  Patient continues to be hyper verbal.

## 2015-12-21 NOTE — Consult Note (Signed)
  Psychiatry: ECT consult again. Came by to talk to the patient this evening. Dr. Mamie Nick this morning had told me that the wife wanted to be involved in potentially trying to convince him to get treatment. First I spoke to the patient and once again he made it clear in no uncertain terms that he refused to get ECT treatment. His thoughts are still so disorganized that rational dialogue is not possible. I spoke with his wife who thinks that maybe she would be able to convince him to get treatment. I invited her to come to the unit tomorrow morning just before 10 the morning and see if she can convince him to sign consent. If so we will continue with plans to do ECT treatment but if he will not give consent downstairs there is no point in bringing them up stairs for treatment.

## 2015-12-21 NOTE — Progress Notes (Signed)
Patient continues to be 1:1 with sitter.  Patient continues to be hyper verbal.  Patient in room.

## 2015-12-21 NOTE — Progress Notes (Signed)
D: Pt denies SI/HI/AVH, but appears to be responding to internal stimuli. Pt is not cooperative with treatment plan, often argumentative and hostile towards staff.. Patient is very hyper verbal, speech is tangential and thoughts are disorganized, continues to be on 1:1 with sitter at bedside, not interacting appropriately with  peers and staff.  A: Pt was offered support and encouragement. Pt was given scheduled medications. Pt was encouraged to attend groups. Q 15 minute checks were done for safety.  R:Pt attends groups and interacts well with peers and staff. Pt is taking medication. Pt has no complaints.Pt receptive to treatment and safety maintained on unit.

## 2015-12-22 LAB — GLUCOSE, CAPILLARY
GLUCOSE-CAPILLARY: 91 mg/dL (ref 65–99)
GLUCOSE-CAPILLARY: 123 mg/dL — AB (ref 65–99)
Glucose-Capillary: 130 mg/dL — ABNORMAL HIGH (ref 65–99)
Glucose-Capillary: 109 mg/dL — ABNORMAL HIGH (ref 65–99)

## 2015-12-22 LAB — CBC
HEMATOCRIT: 35.4 % — AB (ref 40.0–52.0)
HEMOGLOBIN: 12.4 g/dL — AB (ref 13.0–18.0)
MCH: 29.8 pg (ref 26.0–34.0)
MCHC: 35 g/dL (ref 32.0–36.0)
MCV: 85.1 fL (ref 80.0–100.0)
PLATELETS: 239 10*3/uL (ref 150–440)
RBC: 4.16 MIL/uL — ABNORMAL LOW (ref 4.40–5.90)
RDW: 12.7 % (ref 11.5–14.5)
WBC: 3.3 10*3/uL — AB (ref 3.8–10.6)

## 2015-12-22 LAB — PROTIME-INR
INR: 2.64
Prothrombin Time: 27.8 seconds — ABNORMAL HIGH (ref 11.4–15.0)

## 2015-12-22 MED ORDER — TEMAZEPAM 15 MG PO CAPS
30.0000 mg | ORAL_CAPSULE | Freq: Every day | ORAL | Status: DC
  Administered 2015-12-22 – 2015-12-31 (×10): 30 mg via ORAL
  Filled 2015-12-22 (×10): qty 2

## 2015-12-22 NOTE — BHH Group Notes (Signed)
[name] LCSW Group Therapy  12/22/2015 2:31 PM  Type of Therapy:  Group Therapy  Participation Level:  Active  Participation Quality:  Sharing  Affect:  Appropriate  Cognitive:  Disorganized  Insight:  Off Topic  Engagement in Therapy:  Off Topic  Modes of Intervention:  Discussion, Education and Support  Summary of Progress/Problems: Feelings around Relapse. Group members discussed the meaning of relapse and shared personal stories of relapse, how it affected them and others, and how they perceived themselves during this time. Group members were encouraged to identify triggers, warning signs and coping skills used when facing the possibility of relapse. Social supports were discussed and explored in detail. Pt was off topic and would interrupt group to share inappropriate comments about random ideas. Pt was easily redirectable but had no insight for the group discussion.    [name], [place] 12/22/2015, 2:31 PM
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
## 2015-12-22 NOTE — Progress Notes (Signed)
ANTICOAGULATION CONSULT NOTE - Follow Up Consult  Pharmacy Consult for LMWH/VKA Indication: VTE treatment  Allergies  Allergen Reactions  . Asa [Aspirin] Other (See Comments)    Patient tolerates LOW DOSE ASPIRIN.    Patient Measurements: Height: 6' (182.9 cm) Weight: (!) 303 lb (137.44 kg) IBW/kg (Calculated) : 77.6  Vital Signs: Temp: 98.4 F (36.9 C) (07/21 0721) Temp Source: Oral (07/21 0721) BP: 95/66 mmHg (07/21 0721) Pulse Rate: 101 (07/21 0721)  Labs:  Recent Labs  12/20/15 0719 12/21/15 0646 12/22/15 0713  HGB  --   --  12.4*  HCT  --   --  35.4*  PLT  --   --  239  LABPROT 28.7* 30.5* 27.8*  INR 2.75 2.99 2.64   Estimated Creatinine Clearance: 78.4 mL/min (by C-G formula based on Cr of 1.6).  Medical History: Past Medical History  Diagnosis Date  . Bipolar 1 disorder ([place])   . Diabetes mellitus without complication ([place])   . Hypertension   . Hypercholesteremia   . CKD (chronic kidney disease), stage III   . DVT (deep venous thrombosis) (HCC)    Medications:  Warfarin 10 mg po daily  Assessment: 77 yom admitted to ED [facility] with manic symptoms. Recently discharged from [facility] with DVT/PE, had been bridging LMWH and VKA prior to admission.  INR History: 7/9: INR - 1.99 7/10: no INR- warfarin 12.5 mg po daily at 0200 7/11: INR - 2.97- warfarin held 7/12: INR - 1.92 - warfarin 10 mg 7/13: INR - 1.95 - warfarin 10 mg 7/14: INR - 2.56 - warfarin 5 mg 7/15: INR - 2.73 - warfarin 5 mg 7/16: INR - 2.39 - warfarin 7.5 mg 7/17: INR - 2.22 - warfarin 7.5 mg 7/18: INR - 2.53 - warfarin 7.5 mg 7/19: INR - 2.75 - warfarin 7.5 mg 7/20: INR - 2.99 - warfarin 6.5 mg 7/21: INR - 2.64 -  Goal of Therapy:  INR 2-3 Monitor platelets by anticoagulation protocol: Yes   Plan:  INR remains therapeutic. INR came back down a bit.  Will continue current dose of 6.5 mg.    Patient with orders for carbamazepine which interacts with warfarin to decrease the INR.  Will  follow INR closely.   Pharmacy will continue to follow.   Jamia Hoban K, RPh 12/22/2015,9:10 AM

## 2015-12-22 NOTE — Progress Notes (Signed)
1:1 Note- Pt in bathroom at this time, no complaints, 1:1 observation continued for patient safety.

## 2015-12-22 NOTE — Progress Notes (Signed)
1:1 Note- Pt in dayroom on green hall watching tv sitting in chair, no complaints at this time, 1:1 observation continued for patient safety.

## 2015-12-22 NOTE — Progress Notes (Signed)
1:1 Note- high energy level, eating breakfast states he does not want ECT treatment, no complaints at this time, flight of ideas,  1:1 observation continued for patient safety.

## 2015-12-22 NOTE — Progress Notes (Signed)
1:1 Note- Pt in group, no complaints at this time, 1:1 observation continued for patient safey, pt requires frequent redirection.

## 2015-12-22 NOTE — Progress Notes (Signed)
1:1 Note-Pt ate lunch, cooperative, pleasant at this time, no complaints, med compliant, CBG done was 130, pt received 1 unit SSI coverage, 1:1 observations continued for patient safety.

## 2015-12-22 NOTE — Progress Notes (Signed)
1:1 Note- Pt meeting with Dr. Bary Leriche and patient's wife in community room to discuss plans for ECT treatments, no complaints at this time, 1:1 observation continued for patient safety.

## 2015-12-22 NOTE — Progress Notes (Signed)
1:1 Note- Pt in green hall dayroom, comfortable, cooperative, pleasant, no complaints at this time, 1:1 observation continued for patient safety.

## 2015-12-22 NOTE — Progress Notes (Signed)
1:1 Note- Pt eating dinner, took pm medications, CBG done was 123 and patient received 1 unit SSI coverage, no complaints at this time, hyperverbal and flight of ideas during interaction,1:1 observation continued for patient safety.

## 2015-12-22 NOTE — Progress Notes (Signed)
1:1 Note- Pt in room resting comfortably, no complaints at this time, cooperative but still requires occasional limit setting and redirection, 1:1 observation continued for safety.

## 2015-12-22 NOTE — [facility] Group Notes (Signed)
[place] Group Notes:  (Nursing/MHT/Case Management/Adjunct)  Date:  12/22/2015  Time:  2:58 AM  Type of Therapy:  Psychoeducational Skills  Participation Level:  Active  Participation Quality:  Appropriate, Redirectable, Sharing and Supportive  Affect:  Appropriate  Cognitive:  Appropriate  Insight:  Appropriate  Engagement in Group:  Engaged  Modes of Intervention:  Discussion, Socialization and Support  Summary of Progress/Problems: Patient's performance overall was good, but still had to be redirected a few times. [name] 12/22/2015, 2:58 AM
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
## 2015-12-22 NOTE — Progress Notes (Signed)
1:1 Note- Pt attending afternoon group, appropriate but hyper verbal and still requires occasional redirection and boundary setting, no complaints at this time, 1:1 observation continued for patient safety.

## 2015-12-23 LAB — GLUCOSE, CAPILLARY
GLUCOSE-CAPILLARY: 118 mg/dL — AB (ref 65–99)
GLUCOSE-CAPILLARY: 159 mg/dL — AB (ref 65–99)
GLUCOSE-CAPILLARY: 109 mg/dL — AB (ref 65–99)
GLUCOSE-CAPILLARY: 114 mg/dL — AB (ref 65–99)

## 2015-12-23 LAB — PROTIME-INR
INR: 2.44
Prothrombin Time: 26.2 seconds — ABNORMAL HIGH (ref 11.4–15.0)

## 2015-12-23 MED ORDER — WARFARIN SODIUM 4 MG PO TABS
8.0000 mg | ORAL_TABLET | Freq: Every day | ORAL | Status: DC
  Administered 2015-12-23: 8 mg via ORAL
  Filled 2015-12-23: qty 2

## 2015-12-23 NOTE — Progress Notes (Signed)
Patient remains 1:1 with staff.  Patient in the hallway.

## 2015-12-23 NOTE — Progress Notes (Signed)
Patient continues to be 1:1 with sitter.  Patient continues to be hyper verbal.  Patient voices no complaints or concerns at this time.  Patient ambulating in the hallway.

## 2015-12-23 NOTE — Progress Notes (Signed)
Patient continues to be 1:1 with a sitter.  Patient in dayroom with peers.  Patient voices no complaints or concerns at this time.

## 2015-12-23 NOTE — Progress Notes (Signed)
Pt continues to be on 1:1. Pt in the dayroom. Remains free from harm. Will continue to monitor.

## 2015-12-23 NOTE — Progress Notes (Signed)
Pt remains hyper-verbal, pt is disorganized at times, but pt is appropriate on the unit at this time

## 2015-12-23 NOTE — Progress Notes (Signed)
Patient remains 1:1.  Patient in the dayroom.  Patient has no complaints or concerns at this time.  Patient remains hyper verbal.

## 2015-12-23 NOTE — [facility] Group Notes (Signed)
[place] LCSW Group Therapy  12/23/2015 3:33 PM  Type of Therapy:  Group Therapy  Participation Level:  Minimal  Participation Quality:  Attentive  Affect:  Labile   Cognitive:  Disorganized   Insight:  Limited  Engagement in Therapy:  Limited  Modes of Intervention:  Discussion, Education, Socialization and Support  Summary of Progress/Problems: Feelings around Relapse. Group members discussed the meaning of relapse and shared personal stories of relapse, how it affected them and others, and how they perceived themselves during this time. Group members were encouraged to identify triggers, warning signs and coping skills used when facing the possibility of relapse. Social supports were discussed and explored in detail. Pt attended group and stayed the entire time. Pt attempted to participate but it was difficult to stay on topic.   [place] MSW, [place]  12/23/2015, 3:33 PM
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
## 2015-12-23 NOTE — Progress Notes (Signed)
Patient continues to be 1:1 with sitter.  Patient voices no concerns or complaints at this time.  Patient is in the dayroom with peers.  Patient continues to be hyper verbal.

## 2015-12-23 NOTE — Progress Notes (Signed)
Patient continues to be 1:1 with sitter.  Patient in dayroom engaging with peers.  Patient continues to be hyper verbal.  Patient voices no complaints or concerns at this time.

## 2015-12-23 NOTE — Progress Notes (Signed)
Nursing 1:1 note D:Pt observed walking in hallway with sitter. RR even and unlabored. No distress noted.  A: 1:1 observation continues for safety  R: pt remains safe

## 2015-12-23 NOTE — Progress Notes (Signed)
Pt continues to be on 1:1. He is asleep in his chair at this time. Remains free from harm. Will continue to monitor.

## 2015-12-23 NOTE — Progress Notes (Signed)
Patient continues to be 1:1 with sitter.  Patient outside interacting with peers.

## 2015-12-23 NOTE — Progress Notes (Signed)
Patient continues to be 1:1 with sitter.  Patient in dayroom interacting with peers and dancing.  Patient continues to be hyper verbal.

## 2015-12-23 NOTE — Progress Notes (Signed)
Pt continues to be on 1:1. Pt is medication compliant. In dayroom. Remains free from harm. Will continue to monitor.

## 2015-12-23 NOTE — Progress Notes (Signed)
Pt remains on 1:1. Pt is calm and hyperverbal. Easily redirectable. Pt requests PRN medication for pain. Pt remains free from harm. Will continue to monitor.

## 2015-12-23 NOTE — Progress Notes (Addendum)
Pt continues to be on 1:1. He is asleep in his chair at this time. Remains free from harm. Will continue to monitor.

## 2015-12-23 NOTE — Progress Notes (Signed)
Pt in dayroom interacting, 1:1 continues for safety

## 2015-12-23 NOTE — Progress Notes (Signed)
Patient remains 1:1 with sitter.  Patient ambulating hallways.  Patient voices no complaints or concerns at this time.

## 2015-12-23 NOTE — Progress Notes (Signed)
Pt continues to be on 1:1. Pt outside with staff and peers this evening. Remains free from harm. Will continue to monitor.

## 2015-12-23 NOTE — Progress Notes (Signed)
Pt continues to be on 1:1. Pt is awake at this time. He is calm and cooperative. Remains free from harm. Will continue to monitor.

## 2015-12-23 NOTE — Progress Notes (Signed)
ANTICOAGULATION CONSULT NOTE - Follow Up Consult  Pharmacy Consult for LMWH/VKA Indication: VTE treatment  Allergies  Allergen Reactions  . Asa [Aspirin] Other (See Comments)    Patient tolerates LOW DOSE ASPIRIN.    Patient Measurements: Height: 6' (182.9 cm) Weight: (!) 303 lb (137.44 kg) IBW/kg (Calculated) : 77.6  Vital Signs: Temp: 98.2 F (36.8 C) (07/22 0500) BP: 124/53 mmHg (07/22 0500) Pulse Rate: 92 (07/22 0500)  Labs:  Recent Labs  12/21/15 0646 12/22/15 0713 12/23/15 0706  HGB  --  12.4*  --   HCT  --  35.4*  --   PLT  --  239  --   LABPROT 30.5* 27.8* 26.2*  INR 2.99 2.64 2.44   Estimated Creatinine Clearance: 78.4 mL/min (by C-G formula based on Cr of 1.6).  Medical History: Past Medical History  Diagnosis Date  . Bipolar 1 disorder ([place])   . Diabetes mellitus without complication ([place])   . Hypertension   . Hypercholesteremia   . CKD (chronic kidney disease), stage III   . DVT (deep venous thrombosis) (H[facility])    Medications:  Warfarin 10 mg po daily  Assessment: 34 yom admitted to ED BHU with manic symptoms. Recently discharged from [facility] with D[facility]/PE, had been bridging LMWH and VKA prior to admission.  INR History: 7/9: INR - 1.99 7/10: no INR- warfarin 12.5 mg po daily at 0200 7/11: INR - 2.97- warfarin held 7/12: INR - 1.92 - warfarin 10 mg 7/13: INR - 1.95 - warfarin 10 mg 7/14: INR - 2.56 - warfarin 5 mg 7/15: INR - 2.73 - warfarin 5 mg 7/16: INR - 2.39 - warfarin 7.5 mg 7/17: INR - 2.22 - warfarin 7.5 mg 7/18: INR - 2.53 - warfarin 7.5 mg 7/19: INR - 2.75 - warfarin 7.5 mg 7/20: INR - 2.99 - warfarin 6.5 mg 7/21: INR - 2.64 - warfarin 6.5 mg  7/22: INR: 2.44   Goal of Therapy:  INR 2-3 Monitor platelets by anticoagulation protocol: Yes   Plan:  INR remains therapeutic. INR came back down a bit.  Will give warfarin 8 mg PO daily.   Patient with orders for carbamazepine which interacts with warfarin to decrease the INR.  Will  follow INR closely.   Pharmacy will continue to follow.   Reni Hausner D, RPh 12/23/2015,8:02 AM

## 2015-12-23 NOTE — Plan of Care (Signed)
Problem: Health Behavior/Discharge Planning: Goal: Compliance with prescribed medication regimen will improve Outcome: Progressing Pt taking medications as prescribed.  Problem: Safety: Goal: Ability to remain free from injury will improve Outcome: Progressing Pt remains free from harm.

## 2015-12-23 NOTE — Progress Notes (Signed)
Patient continues to be 1:1 with sitter.  Patient in his room.  Patient continues to be hyper verbal.

## 2015-12-23 NOTE — Progress Notes (Signed)
Patient continues to be 1:1 with sitter.  Patient continues to be hyper verbal.  Patient is in the dayroom interacting with peers.

## 2015-12-23 NOTE — Progress Notes (Signed)
Nursing 1:1 note D:Pt was in dayroom earlier, pt took medications without trouble. RR even and unlabored. No distress noted. A: 1:1 observation continues for safety  R: pt remains safe

## 2015-12-23 NOTE — Progress Notes (Signed)
Patient continues to be 1:1 with staff.  Patient continues to be hyper verbal.  Patient voices no concerns at this time.

## 2015-12-23 NOTE — Progress Notes (Signed)
D: Patient continues to display manic characteristics and continues to be hyperverbal. He denies SI/HI/AVH. A: Medication was given with education. Encouragement was provided and patient responded positively.  R: Patient was compliant with all medication and mouth checks were done. He has remained cooperative. Safety maintained with 15 min checks and patient continues to have 1:1 sitter.

## 2015-12-23 NOTE — Plan of Care (Signed)
Problem: Safety: Goal: Periods of time without injury will increase Outcome: Progressing Patient has remained free from injury this shift.  Patient has been compliant with safety rules of the facility.  Will continue to monitor.

## 2015-12-23 NOTE — Progress Notes (Signed)
Pt remains on 1:1. Pt approaches staff appropriately requesting supplies in order to take a shower. Pt requests for bandages on bilateral legs to be changed. He continues to be hyperverbal and is easily redirectable. Pt requests to speak with Chaplain today. Denies SI/HI/AVH at this time. Pt remains free from harm. Will continue to monitor.

## 2015-12-24 LAB — GLUCOSE, CAPILLARY
GLUCOSE-CAPILLARY: 90 mg/dL (ref 65–99)
GLUCOSE-CAPILLARY: 140 mg/dL — AB (ref 65–99)
GLUCOSE-CAPILLARY: 113 mg/dL — AB (ref 65–99)
Glucose-Capillary: 122 mg/dL — ABNORMAL HIGH (ref 65–99)

## 2015-12-24 LAB — PROTIME-INR
INR: 2.6
PROTHROMBIN TIME: 27.5 s — AB (ref 11.4–15.0)

## 2015-12-24 MED ORDER — WARFARIN SODIUM 4 MG PO TABS
10.0000 mg | ORAL_TABLET | Freq: Every day | ORAL | Status: DC
  Administered 2015-12-24 – 2015-12-26 (×3): 10 mg via ORAL
  Filled 2015-12-24 (×3): qty 2

## 2015-12-24 NOTE — Progress Notes (Signed)
Patient continues to be 1:1 with staff.  Patient continues to be hyper verbal but redirectable.  Patient voices no concerns or complaints at this time.  Patient in the dayroom.

## 2015-12-24 NOTE — Progress Notes (Signed)
Patient remains 1:1 with sitter.  Patient in dayroom.  Patient voices no concerns or complaints at this time.

## 2015-12-24 NOTE — Progress Notes (Signed)
ANTICOAGULATION CONSULT NOTE - Follow Up Consult  Pharmacy Consult for LMWH/VKA Indication: VTE treatment  Allergies  Allergen Reactions  . Asa [Aspirin] Other (See Comments)    Patient tolerates LOW DOSE ASPIRIN.    Patient Measurements: Height: 6' (182.9 cm) Weight: (!) 303 lb (137.4 kg) IBW/kg (Calculated) : 77.6  Vital Signs: Temp: 98.3 F (36.8 C) (07/23 0700) Temp Source: Oral (07/23 0700) BP: 122/69 (07/23 0700) Pulse Rate: 88 (07/23 0700)  Labs:  Recent Labs  12/22/15 0713 12/23/15 0706 12/24/15 0816  HGB 12.4*  --   --   HCT 35.4*  --   --   PLT 239  --   --   LABPROT 27.8* 26.2* 27.5*  INR 2.64 2.44 2.60   Estimated Creatinine Clearance: 78.4 mL/min (by C-G formula based on SCr of 1.6 mg/dL).  Medical History: Past Medical History:  Diagnosis Date  . Bipolar 1 disorder ([place])   . CKD (chronic kidney disease), stage III   . Diabetes mellitus without complication ([place])   . DVT (deep venous thrombosis) ([place])   . Hypercholesteremia   . Hypertension    Medications:  Warfarin 10 mg po daily  Assessment: 62 yom admitted to ED BHU with manic symptoms. Recently discharged from [facility] with DVT/PE, had been bridging LMWH and VKA prior to admission.  INR History: 7/9: INR - 1.99 7/10: no INR- warfarin 12.5 mg po daily at 0200 7/11: INR - 2.97- warfarin held 7/12: INR - 1.92 - warfarin 10 mg 7/13: INR - 1.95 - warfarin 10 mg 7/14: INR - 2.56 - warfarin 5 mg 7/15: INR - 2.73 - warfarin 5 mg 7/16: INR - 2.39 - warfarin 7.5 mg 7/17: INR - 2.22 - warfarin 7.5 mg 7/18: INR - 2.53 - warfarin 7.5 mg 7/19: INR - 2.75 - warfarin 7.5 mg 7/20: INR - 2.99 - warfarin 6.5 mg 7/21: INR - 2.64 - warfarin 6.5 mg  7/22: INR: 2.44; warfarin 8  7/23: INR: 2.60;  Goal of Therapy:  INR 2-3 Monitor platelets by anticoagulation protocol: Yes   Plan:  INR remains therapeutic. Will restart home dose of  warfarin 10 mg PO daily.   Patient with orders for carbamazepine which  interacts with warfarin to decrease the INR.  Will follow INR closely.   Pharmacy will continue to follow.   Jaydon Avina D, RPh 12/24/2015,9:19 AM

## 2015-12-24 NOTE — Progress Notes (Signed)
Nursing 1:1 note D:Pt sitting sleeping in chair with eyes closed. RR even and unlabored. No distress noted. A: 1:1 observation continues for safety  R: pt remains safe

## 2015-12-24 NOTE — Plan of Care (Signed)
Problem: Safety: Goal: Ability to remain free from injury will improve Outcome: Progressing Patient has remained free from injury this shift.  Patient is in compliance with rules for safety on the unit.  Will continue to monitor.

## 2015-12-24 NOTE — Progress Notes (Signed)
Patient continues to be 1:1 with staff. Patient continues to be hyper verbal but redirectable. Patient voices no concerns or complaints at this time. Patient eating lunch.

## 2015-12-24 NOTE — Progress Notes (Signed)
Patient continues to be 1:1 with staff. Patient continues to be hyper verbal but redirectable. Patient voices no concerns or complaints at this time. Patient in dayroom.

## 2015-12-24 NOTE — Progress Notes (Signed)
Pt up in room , continues to be delusional at times, but is redirectable. 1:1 continues for safety

## 2015-12-24 NOTE — Progress Notes (Signed)
Patient continues to be 1:1 with staff. Patient continues to be hyper verbal but redirectable. Patient voices no concerns or complaints at this time. Patient is attending group.

## 2015-12-24 NOTE — Progress Notes (Signed)
Patient remains 1:1 with sitter.  Patient in the dayroom interacting with peers.  No complaints or concerns voiced from patient at this time.

## 2015-12-24 NOTE — Progress Notes (Signed)
Pt up in room. Pt continues to be hyper-verbal and disorganized. 1:1 sitter remains for safety

## 2015-12-24 NOTE — Progress Notes (Signed)
Nursing 1:1 note D:Pt observed sleeping in chair with eyes closed. RR even and unlabored. No distress noted. A: 1:1 observation continues for safety  R: pt remains safe

## 2015-12-24 NOTE — Progress Notes (Signed)
Patient continues to be 1:1 with staff. Patient continues to be hyper verbal but redirectable. Patient voices no concerns or complaints at this time. Patient on telephone.

## 2015-12-24 NOTE — Progress Notes (Signed)
D: Patient continues to display manic characteristics and continues to be hyperverbal. He denies SI/HI/AVH. A: Medication was given with education. Encouragement was provided and patient responded positively.  R: Patient was compliant with all medication and mouth checks were done. He has remained cooperative. Safety maintained with 15 min checks and patient continues to have 1:1 sitter.

## 2015-12-24 NOTE — [facility] Group Notes (Signed)
[place] LCSW Group Therapy  12/24/2015 3:45 PM  Type of Therapy:  Group Therapy  Participation Level:  Minimal  Participation Quality:  Monopolizing  Affect:  Excited  Cognitive:  Disorganized  Insight:  Distracting  Engagement in Therapy:  Monopolizing  Modes of Intervention:  Discussion, Education and Support  Summary of Progress/Problems: Patients defined and discussed healthy coping skills. Patients identified healthy coping skills they would like to try during hospitalization and after discharge. CSW offered insight to varying coping skills that may have been new to patients such as practicing mindfulness. Pt was distracting group and would interrupt others when they shared during group. CSW would redirect pt in which he would response "I can't help it". Pt was able to participate in the mindfulness exercise.   Lya Holben G. [place], [place] 12/24/2015, 3:45 PM

## 2015-12-24 NOTE — [facility] Group Notes (Signed)
[place] Group Notes:  (Nursing/MHT/Case Management/Adjunct)  Date:  12/24/2015  Time:  11:26 PM  Type of Therapy:  Group Therapy  Participation Level:  Active  Participation Quality:  Intrusive, Monopolizing and Redirectable  Affect:  Excited  Cognitive:  Disorganized  Insight:  Lacking  Engagement in Group:  Monopolizing and Off Topic  Modes of Intervention:  Discussion  Summary of Progress/Problems: Pt interrupted fellow pt's each time they attempted to share goals. Staff had to redirect pt numerous times. Pt stated that his goal was to go home. Staff advised pt to take his ECT treatment tomorrow. Pt said he was thinking about it.   Jenetta Downer Oreta [place] 12/24/2015, 11:26 PM
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
## 2015-12-25 ENCOUNTER — Inpatient Hospital Stay: Payer: 59 | Admitting: Anesthesiology

## 2015-12-25 LAB — GLUCOSE, CAPILLARY
GLUCOSE-CAPILLARY: 93 mg/dL (ref 65–99)
GLUCOSE-CAPILLARY: 111 mg/dL — AB (ref 65–99)
GLUCOSE-CAPILLARY: 99 mg/dL (ref 65–99)
Glucose-Capillary: 107 mg/dL — ABNORMAL HIGH (ref 65–99)

## 2015-12-25 LAB — PROTIME-INR
INR: 2.44
Prothrombin Time: 26.2 seconds — ABNORMAL HIGH (ref 11.4–15.0)

## 2015-12-25 MED ORDER — METHOHEXITAL SODIUM 100 MG/10ML IV SOSY
PREFILLED_SYRINGE | INTRAVENOUS | Status: DC | PRN
  Administered 2015-12-25: 100 mg via INTRAVENOUS

## 2015-12-25 MED ORDER — SODIUM CHLORIDE 0.9 % IV SOLN
250.0000 mL | Freq: Once | INTRAVENOUS | Status: AC
  Administered 2015-12-25: 12:00:00 via INTRAVENOUS

## 2015-12-25 MED ORDER — METOPROLOL TARTRATE 5 MG/5ML IV SOLN
INTRAVENOUS | Status: DC | PRN
  Administered 2015-12-25: 2 mg via INTRAVENOUS

## 2015-12-25 MED ORDER — SODIUM CHLORIDE 0.9 % IV SOLN
250.0000 mL | Freq: Once | INTRAVENOUS | Status: AC
  Administered 2015-12-25: 500 mL via INTRAVENOUS

## 2015-12-25 MED ORDER — SODIUM CHLORIDE 0.9 % IV SOLN
INTRAVENOUS | Status: DC
Start: 2015-12-25 — End: 2015-12-29
  Administered 2015-12-27 – 2015-12-29 (×2): via INTRAVENOUS

## 2015-12-25 MED ORDER — SUCCINYLCHOLINE CHLORIDE 200 MG/10ML IV SOSY
PREFILLED_SYRINGE | INTRAVENOUS | Status: DC | PRN
  Administered 2015-12-25: 150 mg via INTRAVENOUS

## 2015-12-25 NOTE — Progress Notes (Signed)
Patient in room talking to sitter. Noted to be fidgety and hyper verbal.

## 2015-12-25 NOTE — Progress Notes (Signed)
Patient notified that wife is here to support him in his ECT treatment. He is agreeable to treatment as long as wife is present. ECT called to pick him up.

## 2015-12-25 NOTE — Procedures (Signed)
ECT SERVICES Physician's Interval Evaluation & Treatment Note  Patient Identification: BRIDGE MARZ MRN:  [id number] Date of Evaluation:  12/25/2015 TX #: 1  MADRS: 25  MMSE: 28  P.E. Findings:  Heart and lungs normal. Bilateral lower extremity edema wrapped an Ace bandage is  Psychiatric Interval Note:  Hyperverbal hyperactive disorganized thinking manic hyper religious psychotic  Subjective:  Patient is a 50 y.o. male seen for evaluation for Electroconvulsive Therapy. Treatment for mania. Patient complains of agitation.  Treatment Summary:   []   Right Unilateral             [x]  Bilateral   % Energy : 1.0 ms 100%   Impedance: 560 ohms 934 V squared  Seizure Energy Index: 934 V squared  Postictal Suppression Index: 20%  Seizure Concordance Index: 93%  Medications  Pre Shock: Brevital 100 mg, succinylcholine 150 mg  Post Shock:    Seizure Duration: 10 seconds by EMG, very difficult to read the endpoint of the EEG. I am estimating about 48 seconds although the computer reads it longer than that.   Comments: We will do our best to try and get him in for treatment on Wednesday.   Lungs:  [x]   Clear to auscultation               []  Other:   Heart:    [x]   Regular rhythm             []  irregular rhythm    [x]   Previous H&P reviewed, patient examined and there are NO CHANGES                 []   Previous H&P reviewed, patient examined and there are changes noted.   Alethia Berthold, MD 7/24/201711:41 AM

## 2015-12-25 NOTE — Progress Notes (Signed)
The order for Tanzeum (albiglutide) has completed - it was entered for 2 doses. Pharmacy still has two pens available from patient own med. Please reorder if this should continue.  Bekki Tavenner A. [place], [place].D., BCPS Clinical Pharmacist 12/25/2015 0030

## 2015-12-25 NOTE — Progress Notes (Signed)
D: Observed pt visiting with wife and daughter, with 1:1 sitter. Patient alert and oriented x3, but appears disoriented to time. Patient denies SI/HI/AVH. Pt affect  Is preoccupied, labile, inconsistent with thought. Pt this evening was more calm and focused upon approach. Pt still very hyper verbal and tangential, but more redirectable and more capable of carrying on a conversation. Pt did become more preoccupied with religious imagery and ideology later in the evening claiming another nurse was "the devil...evil." Pt endorsed wanting to "leave tomorrow" and seemed to have paranoid delusions about staff and family working against him. Pt had no complaints A: Offered active listening and support. Provided therapeutic communication. Administered scheduled medications. Redirected pt when necessary. Encouraged pt to participate in ECT treatment tomorrow. Reeducated pt on appropriate behavior.  R: Pt cooperative this evening, with needed redirection. Pt medication compliant. Will continue Q15 min. checks. NPO status maintained. Safety maintained. Pt with 1:1 sitter per order.

## 2015-12-25 NOTE — Progress Notes (Signed)
Patient was escorted to ECT by staff, his wife and sitter. Patient cooperative.

## 2015-12-25 NOTE — Progress Notes (Signed)
[facility] MD Progress Note  12/25/2015 6:02 PM Cameron [name]  MRN:  [id number] Subjective:  This is a 50 year old man with bipolar disorder currently in a manic phase. 2 weeks into his hospitalization. In addition to manic bipolar disorder also has a history of a pulmonary embolism related to deep vein thromboses, diabetes, cellulitis, elevated cholesterol Principal Problem: Bipolar I disorder, current or most recent episode manic, with psychotic features ([place]) Diagnosis:   Patient Active Problem List   Diagnosis Date Noted  . Bipolar I disorder, current or most recent episode manic, with psychotic features ([place]) [F31.2] 12/11/2015  . Pulmonary embolism ([place]) [I26.99] 12/04/2015  . Diabetes mellitus without complication ([place]) [id number]   . Hypercholesteremia [E78.00]   . CKD (chronic kidney disease), stage III [N18.3]   . Acute deep vein thrombosis (DVT) of femoral vein of right lower extremity (HCC) [I82.411]    Total Time spent with patient: 30 minutes  Past Psychiatric History: Long-standing history of bipolar disorder with multiple episodes of mania. Also has had depression. Very sensitive to changes in his medicine.  Past Medical History:  Past Medical History:  Diagnosis Date  . Bipolar 1 disorder ([place])   . CKD (chronic kidney disease), stage III   . Diabetes mellitus without complication ([place])   . DVT (deep venous thrombosis) ([place])   . Hypercholesteremia   . Hypertension     Past Surgical History:  Procedure Laterality Date  . APPENDECTOMY     Family History:  Family History  Problem Relation Age of Onset  . Stroke Other   . Diabetes Other    Family Psychiatric  History: Positive for bipolar disorder in at least 2 first-degree relatives Social History:  History  Alcohol Use  . Yes    Comment: rarely     History  Drug Use No    Social History   Social History  . Marital status: Married    Spouse name: N/A  . Number of children: N/A  . Years of education: N/A    Social History Main Topics  . Smoking status: Never Smoker  . Smokeless tobacco: None  . Alcohol use Yes     Comment: rarely  . Drug use: No  . Sexual activity: Not Asked   Other Topics Concern  . None   Social History Narrative  . None   Additional Social History:                         Sleep: Poor  Appetite:  Good  Current Medications: Current Facility-Administered Medications  Medication Dose Route Frequency Provider Last Rate Last Dose  . 0.9 %  sodium chloride infusion   Intravenous Continuous Gonzella Lex, MD      . acetaminophen (TYLENOL) tablet 650 mg  650 mg Oral Q6H PRN Hildred Priest, MD   650 mg at 12/25/15 1227  . alum & mag hydroxide-simeth (MAALOX/MYLANTA) 200-200-20 MG/5ML suspension 30 mL  30 mL Oral Q4H PRN Hildred Priest, MD      . ARIPiprazole (ABILIFY) tablet 30 mg  30 mg Oral QPM Hildred Priest, MD   30 mg at 12/25/15 1730  . atorvastatin (LIPITOR) tablet 40 mg  40 mg Oral Daily Hildred Priest, MD   40 mg at 12/25/15 1251  . canagliflozin (INVOKANA) tablet 100 mg  100 mg Oral BH-q7a Clovis Fredrickson, MD   100 mg at 12/25/15 [id number]  . carbamazepine (TEGRETOL XR) 12 hr tablet 600 mg  600 mg  Oral QHS Clovis Fredrickson, MD   600 mg at 12/24/15 2151  . carbamazepine (TEGRETOL) tablet 400 mg  400 mg Oral Q breakfast Hildred Priest, MD   400 mg at 12/25/15 1251  . enalapril (VASOTEC) tablet 5 mg  5 mg Oral BID Hildred Priest, MD   5 mg at 12/25/15 0850  . fenofibrate tablet 160 mg  160 mg Oral Daily Hildred Priest, MD   160 mg at 12/25/15 1250  . haloperidol (HALDOL) tablet 20 mg  20 mg Oral Q6H PRN Clovis Fredrickson, MD   20 mg at 12/22/15 0249  . insulin aspart (novoLOG) injection 0-5 Units  0-5 Units Subcutaneous QHS Hildred Priest, MD      . insulin aspart (novoLOG) injection 0-9 Units  0-9 Units Subcutaneous TID WC Hildred Priest, MD    Stopped at 12/25/15 [id number]  . insulin glargine (LANTUS) injection 45 Units  45 Units Subcutaneous Daily Clovis Fredrickson, MD   45 Units at 12/25/15 1735  . lamoTRIgine (LAMICTAL) tablet 400 mg  400 mg Oral QHS Hildred Priest, MD   400 mg at 12/24/15 2150  . LORazepam (ATIVAN) tablet 2 mg  2 mg Oral Q2H PRN Gonzella Lex, MD   2 mg at 12/24/15 1703  . magnesium hydroxide (MILK OF MAGNESIA) suspension 30 mL  30 mL Oral Daily PRN Hildred Priest, MD      . midazolam (VERSED) injection 2 mg  2 mg Intravenous Once Gonzella Lex, MD      . QUEtiapine (SEROQUEL) tablet 800 mg  800 mg Oral QHS Hildred Priest, MD   800 mg at 12/24/15 2150  . temazepam (RESTORIL) capsule 30 mg  30 mg Oral QHS Clovis Fredrickson, MD   30 mg at 12/24/15 2151  . warfarin (COUMADIN) tablet 10 mg  10 mg Oral q1800 Hildred Priest, MD   10 mg at 12/25/15 1730  . Warfarin - Pharmacist Dosing Inpatient   Does not apply q1800 Hildred Priest, MD        Lab Results:  Results for orders placed or performed during the hospital encounter of 12/11/15 (from the past 48 hour(s))  Glucose, capillary     Status: Abnormal   Collection Time: 12/23/15  8:15 PM  Result Value Ref Range   Glucose-Capillary 114 (H) 65 - 99 mg/dL  Glucose, capillary     Status: None   Collection Time: 12/24/15  6:35 AM  Result Value Ref Range   Glucose-Capillary 90 65 - 99 mg/dL  Protime-INR     Status: Abnormal   Collection Time: 12/24/15  8:16 AM  Result Value Ref Range   Prothrombin Time 27.5 (H) 11.4 - 15.0 seconds   INR 2.60   Glucose, capillary     Status: Abnormal   Collection Time: 12/24/15 11:28 AM  Result Value Ref Range   Glucose-Capillary 140 (H) 65 - 99 mg/dL  Glucose, capillary     Status: Abnormal   Collection Time: 12/24/15  4:09 PM  Result Value Ref Range   Glucose-Capillary 122 (H) 65 - 99 mg/dL  Glucose, capillary     Status: Abnormal   Collection Time: 12/24/15  9:55 PM    Result Value Ref Range   Glucose-Capillary 113 (H) 65 - 99 mg/dL  Protime-INR     Status: Abnormal   Collection Time: 12/25/15  6:27 AM  Result Value Ref Range   Prothrombin Time 26.2 (H) 11.4 - 15.0 seconds   INR 2.44   Glucose, capillary  Status: Abnormal   Collection Time: 12/25/15  6:38 AM  Result Value Ref Range   Glucose-Capillary 107 (H) 65 - 99 mg/dL  Glucose, capillary     Status: None   Collection Time: 12/25/15 12:49 PM  Result Value Ref Range   Glucose-Capillary 93 65 - 99 mg/dL  Glucose, capillary     Status: Abnormal   Collection Time: 12/25/15  4:51 PM  Result Value Ref Range   Glucose-Capillary 111 (H) 65 - 99 mg/dL   Comment 1 Notify RN     Blood Alcohol level:  Lab Results  Component Value Date   ETH <5 [id number]    Metabolic Disorder Labs: Lab Results  Component Value Date   HGBA1C 8.3 (H) 12/04/2015   MPG 192 12/04/2015   MPG 318 (H) 08/24/2010   Lab Results  Component Value Date   PROLACTIN 4.4 12/11/2015   Lab Results  Component Value Date   CHOL 143 12/11/2015   TRIG 286 (H) 12/11/2015   HDL 32 (L) 12/11/2015   CHOLHDL 4.5 12/11/2015   VLDL 57 (H) 12/11/2015   LDLCALC 54 12/11/2015   LDLCALC 56 12/05/2015    Physical Findings: AIMS:  , ,  ,  ,    CIWA:    COWS:     Musculoskeletal: Strength & Muscle Tone: within normal limits Gait & Station: normal Patient leans: N/A  Psychiatric Specialty Exam: Physical Exam  Constitutional: He appears well-developed and well-nourished.  HENT:  Head: Normocephalic and atraumatic.  Eyes: Conjunctivae are normal. Pupils are equal, round, and reactive to light.  Neck: Normal range of motion.  Cardiovascular: Normal heart sounds.   Respiratory: Effort normal.  GI: Soft.  Musculoskeletal: Normal range of motion.  Neurological: He is alert.  Skin: Skin is warm and dry.     Psychiatric: His affect is labile and inappropriate. His speech is rapid and/or pressured and tangential. He is  agitated and hyperactive. Thought content is delusional. Cognition and memory are impaired. He expresses impulsivity. He expresses no suicidal ideation. He is inattentive.    Review of Systems  Constitutional: Negative.   HENT: Negative.   Eyes: Negative.   Respiratory: Negative.   Cardiovascular: Negative.   Gastrointestinal: Negative.   Musculoskeletal: Negative.   Skin: Negative.   Neurological: Negative.   Psychiatric/Behavioral: Positive for memory loss. Negative for depression, hallucinations, substance abuse and suicidal ideas. The patient is nervous/anxious and has insomnia.     Blood pressure 113/61, pulse 93, temperature 98.7 F (37.1 C), temperature source Oral, resp. rate 20, height 6' (1.829 m), weight (!) 137.4 kg (303 lb), SpO2 95 %.Body mass index is 41.09 kg/m.  General Appearance: Disheveled  Eye Contact:  Fair  Speech:  Pressured  Volume:  Increased  Mood:  Euphoric  Affect:  Inappropriate, Labile and Full Range  Thought Process:  Irrelevant  Orientation:  Full (Time, Place, and Person)  Thought Content:  Illogical  Suicidal Thoughts:  No  Homicidal Thoughts:  No  Memory:  Immediate;   Fair Recent;   Poor Remote;   Fair  Judgement:  Fair  Insight:  Good and Fair  Psychomotor Activity:  Increased and Restlessness  Concentration:  Concentration: Poor  Recall:  [place] of Knowledge:  Good  Language:  Good  Akathisia:  No  Handed:  Right  AIMS (if indicated):     Assets:  Agricultural consultant Housing Resilience Social Support  ADL's:  Intact  Cognition:  WNL  Sleep:  Number  of Hours: 5.75     Treatment Plan Summary: Daily contact with patient to assess and evaluate symptoms and progress in treatment, Medication management and Plan Patient is a 50 year old man with bipolar disorder. Currently manic. Still psychotic with very disorganized thoughts delusional thinking hyperreligious thinking. Patient can just barely  hold enough of a conversation to answer simple questions. He is not violent or suicidal but is clearly not able to take care of himself or function socially. Today he finally gave consent for ECT treatment after several days of discussion. He had bilateral ECT treatment and had an effective length seizure. Tolerated well. No complications and no physical complaints this afternoon. Nursing tells me for a couple hours after the treatment he seemed to be a little bit calmer but now he is back to his previous condition but no worse. No sign of delirium.Patient's blood sugars are staying in the low 100s which is good.Last Tegretol level was about a week ago and was on the low side of normal. We will probably want to recheck that and may need to increase his Tegretol dosage. Patient is on antipsychotic and mood stabilizers. Appears to be compliant with medicine. His history is one of extended manias that usually require both medication and ECT to improve. I'm hoping we will be able to get him to continue ECT throughout the week.  Alethia Berthold, MD 12/25/2015, 6:02 PM

## 2015-12-25 NOTE — Progress Notes (Signed)
Patient at ECT.

## 2015-12-25 NOTE — Progress Notes (Signed)
Patient in his room talking to sitter. Patient hyper verbal and tangential but pleasant. Discussed plan for ECT today. Patient agreed to having wife present for ECT.

## 2015-12-25 NOTE — Transfer of Care (Signed)
Immediate Anesthesia Transfer of Care Note  Patient: Cameron [name]  Procedure(s) Performed: * No procedures listed *  Patient Location: PACU  Anesthesia Type:General  Level of Consciousness: sedated  Airway & Oxygen Therapy: Patient Spontanous Breathing and Patient connected to face mask oxygen  Post-op Assessment: Report given to RN and Post -op Vital signs reviewed and stable  Post vital signs: Reviewed and stable  Last Vitals:  Vitals:   12/25/15 0959 12/25/15 1202  BP: 138/70 130/77  Pulse: 85 98  Resp: 18 15  Temp: 36.4 C [id number] C    Complications: No apparent anesthesia complications

## 2015-12-25 NOTE — Anesthesia Preprocedure Evaluation (Signed)
Anesthesia Evaluation  Patient identified by MRN, date of birth, ID band Patient awake    Reviewed: Allergy & Precautions, NPO status , Patient's Chart, lab work & pertinent test results  History of Anesthesia Complications Negative for: history of anesthetic complications  Airway Mallampati: II  TM Distance: >3 FB Neck ROM: Full    Dental no notable dental hx.    Pulmonary neg COPD, PE (hx of PE, on anticoagulation)   breath sounds clear to auscultation- rhonchi (-) wheezing      Cardiovascular hypertension, Pt. on medications (-) angina+ Peripheral Vascular Disease  (-) CAD and (-) Past MI  Rhythm:Regular Rate:Normal - Systolic murmurs and - Diastolic murmurs    Neuro/Psych PSYCHIATRIC DISORDERS Bipolar Disorder negative neurological ROS     GI/Hepatic negative GI ROS, Neg liver ROS,   Endo/Other  diabetes, Well Controlled, Insulin Dependent  Renal/GU CRFRenal disease (baseline Cr 1.6)     Musculoskeletal negative musculoskeletal ROS (+)   Abdominal (+) + obese,   Peds  Hematology negative hematology ROS (+)   Anesthesia Other Findings Past Medical History: No date: Bipolar 1 disorder (HCC) No date: CKD (chronic kidney disease), stage III No date: Diabetes mellitus without complication (HCC) No date: DVT (deep venous thrombosis) (HCC) No date: Hypercholesteremia No date: Hypertension   Reproductive/Obstetrics                             Anesthesia Physical Anesthesia Plan  ASA: III  Anesthesia Plan: General   Post-op Pain Management:    Induction: Intravenous  Airway Management Planned: Mask  Additional Equipment:   Intra-op Plan:   Post-operative Plan:   Informed Consent: I have reviewed the patients History and Physical, chart, labs and discussed the procedure including the risks, benefits and alternatives for the proposed anesthesia with the patient or authorized  representative who has indicated his/her understanding and acceptance.     Plan Discussed with:   Anesthesia Plan Comments:         Anesthesia Quick Evaluation

## 2015-12-25 NOTE — Progress Notes (Addendum)
Patient returned from ECT at 12:45. VS obtained and Medications administered. Patient was incontinent during ECT, he showered, changed his clothes and escorted to the dayroom for his lunch. BG checked =93, with no insulin coverage needed. Patient removed his bandages from his legs even after being instructed not to.

## 2015-12-25 NOTE — Progress Notes (Signed)
ANTICOAGULATION CONSULT NOTE - Follow Up Consult  Pharmacy Consult for LMWH/VKA Indication: VTE treatment  Allergies  Allergen Reactions  . Asa [Aspirin] Other (See Comments)    Patient tolerates LOW DOSE ASPIRIN.    Patient Measurements: Height: 6' (182.9 cm) Weight: (!) 303 lb (137.4 kg) IBW/kg (Calculated) : 77.6  Vital Signs: Temp: 97.6 F (36.4 C) (07/24 0714) Temp Source: Oral (07/24 0714) BP: 106/69 (07/24 0714) Pulse Rate: 87 (07/24 0714)  Labs:  Recent Labs  12/23/15 0706 12/24/15 0816 12/25/15 0627  LABPROT 26.2* 27.5* 26.2*  INR 2.44 2.60 2.44   Estimated Creatinine Clearance: 78.4 mL/min (by C-G formula based on SCr of 1.6 mg/dL).  Medical History: Past Medical History:  Diagnosis Date  . Bipolar 1 disorder ([place])   . CKD (chronic kidney disease), stage III   . Diabetes mellitus without complication ([place])   . DVT (deep venous thrombosis) ([place])   . Hypercholesteremia   . Hypertension    Medications:  Warfarin 10 mg po daily  Assessment: 42 yom admitted to ED B[facility] with manic symptoms. [facility] with DVT/PE, had been bridging LMWH and VKA prior to admission.  INR History: 7/9: INR - 1.99 7/10: no INR- warfarin 12.5 mg po daily at 0200 7/11: INR - 2.97- warfarin held 7/12: INR - 1.92 - warfarin 10 mg 7/13: INR - 1.95 - warfarin 10 mg 7/14: INR - 2.56 - warfarin 5 mg 7/15: INR - 2.73 - warfarin 5 mg 7/16: INR - 2.39 - warfarin 7.5 mg 7/17: INR - 2.22 - warfarin 7.5 mg 7/18: INR - 2.53 - warfarin 7.5 mg 7/19: INR - 2.75 - warfarin 7.5 mg 7/20: INR - 2.99 - warfarin 6.5 mg 7/21: INR - 2.64 - warfarin 6.5 mg  7/22: INR: 2.44; warfarin 8  7/23: INR: 2.60;  Warfarin 10mg  7/24: INR 2.44 -  Goal of Therapy:  INR 2-3 Monitor platelets by anticoagulation protocol: Yes   Plan:  INR remains therapeutic. Continue current dose.  Patient with orders for carbamazepine which interacts with warfarin to decrease the INR.  Will follow  INR closely.   Pharmacy will continue to follow.   Ripley Bogosian K, RPh 12/25/2015,8:59 AM

## 2015-12-25 NOTE — H&P (Signed)
Cameron [name] is an 50 y.o. male.   Chief Complaint: Patient continues to be hyperactive hyperverbal disorganized tangential HPI: Bipolar disorder manic  Past Medical History:  Diagnosis Date  . Bipolar 1 disorder ([place])   . CKD (chronic kidney disease), stage III   . Diabetes mellitus without complication ([place])   . DVT (deep venous thrombosis) ([place])   . Hypercholesteremia   . Hypertension     Past Surgical History:  Procedure Laterality Date  . APPENDECTOMY      Family History  Problem Relation Age of Onset  . Stroke Other   . Diabetes Other    Social History:  reports that he has never smoked. He does not have any smokeless tobacco history on file. He reports that he drinks alcohol. He reports that he does not use drugs.  Allergies:  Allergies  Allergen Reactions  . Asa [Aspirin] Other (See Comments)    Patient tolerates LOW DOSE ASPIRIN.    Medications Prior to Admission  Medication Sig Dispense Refill  . ARIPiprazole (ABILIFY) 30 MG tablet Take 30 mg by mouth every evening.     Marland Kitchen atorvastatin (LIPITOR) 40 MG tablet Take 40 mg by mouth daily.  11  . Canagliflozin (INVOKANA) 100 MG TABS Take 100 mg by mouth daily with breakfast.     . carbamazepine (TEGRETOL) 200 MG tablet Take 400-600 mg by mouth 2 (two) times daily. Take 2 tablets in the morning Take 3 tablets in the evening    . enalapril (VASOTEC) 5 MG tablet Take 5 mg by mouth 2 (two) times daily.    Marland Kitchen enoxaparin (LOVENOX) 150 MG/ML injection Inject 1 mL (150 mg total) into the skin every 12 (twelve) hours. 8 Syringe 0  . fenofibrate 160 MG tablet Take 160 mg by mouth daily.    . Insulin Glargine (LANTUS SOLOSTAR) 100 UNIT/ML Solostar Pen Inject 60 Units into the skin daily.     Marland Kitchen lamoTRIgine (LAMICTAL) 100 MG tablet Take 400 mg by mouth at bedtime.    . Multiple Vitamin (MULTIVITAMIN WITH MINERALS) TABS tablet Take 1 tablet by mouth daily.    . QUEtiapine (SEROQUEL) 400 MG tablet Take 800 mg by mouth at  bedtime.    Marland Kitchen TANZEUM 50 MG PEN Inject 50 mg into the skin every 7 (seven) days.  2  . temazepam (RESTORIL) 15 MG capsule Take 15 mg by mouth at bedtime as needed for sleep.    Marland Kitchen warfarin (COUMADIN) 5 MG tablet Take 2 tablets (10 mg total) by mouth daily. 60 tablet 0    Results for orders placed or performed during the hospital encounter of 12/11/15 (from the past 48 hour(s))  Glucose, capillary     Status: Abnormal   Collection Time: 12/23/15  4:30 PM  Result Value Ref Range   Glucose-Capillary 109 (H) 65 - 99 mg/dL  Glucose, capillary     Status: Abnormal   Collection Time: 12/23/15  8:15 PM  Result Value Ref Range   Glucose-Capillary 114 (H) 65 - 99 mg/dL  Glucose, capillary     Status: None   Collection Time: 12/24/15  6:35 AM  Result Value Ref Range   Glucose-Capillary 90 65 - 99 mg/dL  Protime-INR     Status: Abnormal   Collection Time: 12/24/15  8:16 AM  Result Value Ref Range   Prothrombin Time 27.5 (H) 11.4 - 15.0 seconds   INR 2.60   Glucose, capillary     Status: Abnormal   Collection Time: 12/24/15  11:28 AM  Result Value Ref Range   Glucose-Capillary 140 (H) 65 - 99 mg/dL  Glucose, capillary     Status: Abnormal   Collection Time: 12/24/15  4:09 PM  Result Value Ref Range   Glucose-Capillary 122 (H) 65 - 99 mg/dL  Glucose, capillary     Status: Abnormal   Collection Time: 12/24/15  9:55 PM  Result Value Ref Range   Glucose-Capillary 113 (H) 65 - 99 mg/dL  Protime-INR     Status: Abnormal   Collection Time: 12/25/15  6:27 AM  Result Value Ref Range   Prothrombin Time 26.2 (H) 11.4 - 15.0 seconds   INR 2.44   Glucose, capillary     Status: Abnormal   Collection Time: 12/25/15  6:38 AM  Result Value Ref Range   Glucose-Capillary 107 (H) 65 - 99 mg/dL   No results found.  Review of Systems  Constitutional: Negative.   HENT: Negative.   Eyes: Negative.   Respiratory: Negative.   Cardiovascular: Negative.   Gastrointestinal: Negative.   Musculoskeletal:  Negative.   Skin: Negative.   Neurological: Negative.   Psychiatric/Behavioral: Positive for memory loss. Negative for depression, hallucinations, substance abuse and suicidal ideas. The patient has insomnia.     Blood pressure 138/70, pulse 85, temperature 97.6 F (36.4 C), temperature source Oral, resp. rate 18, height 6' (1.829 m), weight (!) 137.4 kg (303 lb), SpO2 98 %. Physical Exam  Nursing note and vitals reviewed. Constitutional: He appears well-developed and well-nourished.  HENT:  Head: Normocephalic and atraumatic.  Eyes: Conjunctivae are normal. Pupils are equal, round, and reactive to light.  Neck: Normal range of motion.  Cardiovascular: Regular rhythm and normal heart sounds.   Respiratory: Effort normal and breath sounds normal.  GI: Soft.  Musculoskeletal: Normal range of motion.       Feet:  Neurological: He is alert.  Skin: Skin is warm and dry.  Psychiatric: His affect is labile and inappropriate. His speech is rapid and/or pressured and tangential. He is agitated and hyperactive. Thought content is paranoid and delusional. Cognition and memory are impaired. He expresses impulsivity. He is inattentive.     Assessment/Plan ECT today as per his previous treatment. Anticipate hopefully treatment for the rest of the week at least  Alethia Berthold, MD 12/25/2015, 11:39 AM

## 2015-12-25 NOTE — Anesthesia Postprocedure Evaluation (Signed)
Anesthesia Post Note  Patient: Cameron [name]  Procedure(s) Performed: * No procedures listed *  Patient location during evaluation: PACU Anesthesia Type: General Level of consciousness: awake and alert Pain management: pain level controlled Vital Signs Assessment: post-procedure vital signs reviewed and stable Respiratory status: spontaneous breathing, nonlabored ventilation, respiratory function stable and patient connected to nasal cannula oxygen Cardiovascular status: blood pressure returned to baseline and stable Postop Assessment: no signs of nausea or vomiting Anesthetic complications: no    Last Vitals:  Vitals:   12/25/15 1212 12/25/15 1232  BP: 134/79 134/73  Pulse: 97 95  Resp: 17 (!) 22  Temp:  36.8 C    Last Pain:  Vitals:   12/25/15 1232  TempSrc:   PainSc: 5                  Martha Clan

## 2015-12-25 NOTE — Progress Notes (Signed)
Patient in the dayroom eating lunch with staff/sitter present.

## 2015-12-25 NOTE — Consult Note (Signed)
[place] Nurse wound consult note  Reason for Consult: Edema, blistering to lower legs.  Recent infestation with bed bugs, per patient and that is when this began.  Unnas boots applied a week ago and patient removed them a few days later. Wound type:Dermatitis from bed bugs, scratching Pressure Ulcer POA: N/A Measurement:Left anterior pretibial leg with 2 cm x 2 x 0.2 cm nonintact lesion, scattered nonintact lesions, weeping serosanguinous drainage. Right anterior leg 2 cm x 1 cm x 0.1 cm nonintact lesion.   Feet are edematous. Left metatarsal bruising is resolving.  Patient states he is on blood thinners and bruises easily.  Was wearing water shoes and encouraged to wear nonslip socks here and not the water shoes.   Right anterior leg with scattered nonintact lesions 0.5 cm in diameter. Wound [id number] and moist Drainage (amount, consistency, odor) minimal serosanguinous.  No odor from wounds.  Foot odor from water shoes.  Periwound:Edema, erythema Dressing procedure/placement/frequency:Cleanse bilateral legs with soap and water.  Zinc layer secured with self adherent [place] layer. Change weekly. Patient instructed to leave wraps on. Will reevaluate if still here.  Will not follow at this time.  Please re-consult if needed.  Domenic Moras RN BSN [place] Pager [phone]

## 2015-12-25 NOTE — Progress Notes (Signed)
Patient in his room with sitter, talkative, fidgety, preoccupied and tangential.

## 2015-12-25 NOTE — Progress Notes (Signed)
Patient in his room with 1:1 staff present. Patient taking another shower to "cool off". Noted to be taking a shower with his underwear on, very hyper verbal and unable to complete task without going into a tangent.

## 2015-12-25 NOTE — Progress Notes (Signed)
Patient in room talking to his 1:1 staff. Remains hyper verbal.

## 2015-12-25 NOTE — [facility] Group Notes (Signed)
[facility] Group Notes:  (Nursing/MHT/Case Management/Adjunct)  Date:  12/25/2015  Time:  11:13 PM  Type of Therapy:  Evening Wrap-up Group  Participation Level:  Active  Participation Quality:  Intrusive and Hyperverbal  Affect:  Not Congruent/Off topic  Cognitive:  Disorganized  Insight:  Limited  Engagement in Group:  Off Topic and Hyperverbal  Modes of Intervention:  Activity  Summary of Progress/Problems: Patient participated in outside activity but had to be redirected numerous times. Patient hyper verbal and off topic.  Worley Radermacher Nanta Melony Tenpas 12/25/2015, 11:13 PM

## 2015-12-25 NOTE — [facility] Group Notes (Signed)
[facility] LCSW Group Therapy   12/25/2015 9:30 am Type of Therapy: Group Therapy   Participation Level: Invited but did not attend.  Participation Quality: Invited but did not attend.  Glorious Peach, LCSWA

## 2015-12-25 NOTE — Progress Notes (Signed)
Patient's BG at 4:50pm = 111. No correctional coverage insulin given. Scheduled 45 units of Lantus given and patient was cooperative. Patient in the dayroom talking to other peers and staff.

## 2015-12-25 NOTE — Progress Notes (Signed)
Patient met with MD briefly, then seen on the phone. No falls and no aggressive behaviors exhibited by patient.

## 2015-12-25 NOTE — Plan of Care (Signed)
Problem: Education: Goal: Will be free of psychotic symptoms Outcome: Progressing Pt still exhibiting manic symptoms, but was more calm, and focused this evening. No late night outbursts or events.

## 2015-12-26 ENCOUNTER — Other Ambulatory Visit: Payer: Self-pay | Admitting: Psychiatry

## 2015-12-26 LAB — GLUCOSE, CAPILLARY
GLUCOSE-CAPILLARY: 82 mg/dL (ref 65–99)
GLUCOSE-CAPILLARY: 159 mg/dL — AB (ref 65–99)
GLUCOSE-CAPILLARY: 107 mg/dL — AB (ref 65–99)
Glucose-Capillary: 101 mg/dL — ABNORMAL HIGH (ref 65–99)

## 2015-12-26 LAB — PROTIME-INR
INR: 3.22
Prothrombin Time: 32.3 seconds — ABNORMAL HIGH (ref 11.4–15.0)

## 2015-12-26 NOTE — Progress Notes (Signed)
[facility] MD Progress Note  12/26/2015 5:53 PM Cameron [name]  MRN:  [id number] Subjective:  This is a 50 year old man with bipolar disorder currently in a manic phase. 2 weeks into his hospitalization. In addition to manic bipolar disorder also has a history of a pulmonary embolism related to deep vein thromboses, diabetes, cellulitis, elevated cholesterol  Follow-up on Tuesday the 25th. Patient interviewed. Chart reviewed. Reviewed with treatment team. Patient tolerated his first ECT treatment with very little complication only a little bit of soreness in his jaw. He has clearly shown some improvement in his mood and thinking but it's just as clear to me that he is still hypomanic bordering into manic at times. Patient is argumentative with me about his illness and shows poor insight. He is suggesting that he may not agree to a further treatment tomorrow. Principal Problem: Bipolar I disorder, current or most recent episode manic, with psychotic features ([place]) Diagnosis:   Patient Active Problem List   Diagnosis Date Noted  . Bipolar I disorder, current or most recent episode manic, with psychotic features ([place]) [F31.2] 12/11/2015  . Pulmonary embolism ([place]) [I26.99] 12/04/2015  . Diabetes mellitus without complication ([place]) [id number]   . Hypercholesteremia [E78.00]   . CKD (chronic kidney disease), stage III [N18.3]   . Acute deep vein thrombosis (DVT) of femoral vein of right lower extremity (HCC) [I82.411]    Total Time spent with patient: 30 minutes  Past Psychiatric History: Long-standing history of bipolar disorder with multiple episodes of mania. Also has had depression. Very sensitive to changes in his medicine.  Past Medical History:  Past Medical History:  Diagnosis Date  . Bipolar 1 disorder ([place])   . CKD (chronic kidney disease), stage III   . Diabetes mellitus without complication ([place])   . DVT (deep venous thrombosis) ([place])   . Hypercholesteremia   . Hypertension     Past  Surgical History:  Procedure Laterality Date  . APPENDECTOMY     Family History:  Family History  Problem Relation Age of Onset  . Stroke Other   . Diabetes Other    Family Psychiatric  History: Positive for bipolar disorder in at least 2 first-degree relatives Social History:  History  Alcohol Use  . Yes    Comment: rarely     History  Drug Use No    Social History   Social History  . Marital status: Married    Spouse name: N/A  . Number of children: N/A  . Years of education: N/A   Social History Main Topics  . Smoking status: Never Smoker  . Smokeless tobacco: None  . Alcohol use Yes     Comment: rarely  . Drug use: No  . Sexual activity: Not Asked   Other Topics Concern  . None   Social History Narrative  . None   Additional Social History:                         Sleep: Poor  Appetite:  Good  Current Medications: Current Facility-Administered Medications  Medication Dose Route Frequency Provider Last Rate Last Dose  . 0.9 %  sodium chloride infusion   Intravenous Continuous Gonzella Lex, MD      . acetaminophen (TYLENOL) tablet 650 mg  650 mg Oral Q6H PRN Hildred Priest, MD   650 mg at 12/26/15 1107  . alum & mag hydroxide-simeth (MAALOX/MYLANTA) 200-200-20 MG/5ML suspension 30 mL  30 mL Oral Q4H PRN Seth Bake  Hernandez-Gonzalez, MD      . ARIPiprazole (ABILIFY) tablet 30 mg  30 mg Oral QPM Hildred Priest, MD   30 mg at 12/26/15 1712  . atorvastatin (LIPITOR) tablet 40 mg  40 mg Oral Daily Hildred Priest, MD   40 mg at 12/26/15 0901  . canagliflozin (INVOKANA) tablet 100 mg  100 mg Oral BH-q7a Clovis Fredrickson, MD   100 mg at 12/26/15 0629  . carbamazepine (TEGRETOL XR) 12 hr tablet 600 mg  600 mg Oral QHS Jolanta B Pucilowska, MD   600 mg at 12/25/15 2209  . carbamazepine (TEGRETOL) tablet 400 mg  400 mg Oral Q breakfast Hildred Priest, MD   400 mg at 12/26/15 0901  . enalapril (VASOTEC) tablet  5 mg  5 mg Oral BID Hildred Priest, MD   5 mg at 12/26/15 0901  . fenofibrate tablet 160 mg  160 mg Oral Daily Hildred Priest, MD   160 mg at 12/26/15 0901  . haloperidol (HALDOL) tablet 20 mg  20 mg Oral Q6H PRN Clovis Fredrickson, MD   20 mg at 12/22/15 0249  . insulin aspart (novoLOG) injection 0-5 Units  0-5 Units Subcutaneous QHS Hildred Priest, MD      . insulin aspart (novoLOG) injection 0-9 Units  0-9 Units Subcutaneous TID WC Hildred Priest, MD   Stopped at 12/26/15 1708  . insulin glargine (LANTUS) injection 45 Units  45 Units Subcutaneous Daily Clovis Fredrickson, MD   45 Units at 12/26/15 1709  . lamoTRIgine (LAMICTAL) tablet 400 mg  400 mg Oral QHS Hildred Priest, MD   400 mg at 12/25/15 2209  . LORazepam (ATIVAN) tablet 2 mg  2 mg Oral Q2H PRN Gonzella Lex, MD   2 mg at 12/26/15 1107  . magnesium hydroxide (MILK OF MAGNESIA) suspension 30 mL  30 mL Oral Daily PRN Hildred Priest, MD      . midazolam (VERSED) injection 2 mg  2 mg Intravenous Once Gonzella Lex, MD      . QUEtiapine (SEROQUEL) tablet 800 mg  800 mg Oral QHS Hildred Priest, MD   800 mg at 12/25/15 2209  . temazepam (RESTORIL) capsule 30 mg  30 mg Oral QHS Jolanta B Pucilowska, MD   30 mg at 12/25/15 2210  . warfarin (COUMADIN) tablet 10 mg  10 mg Oral q1800 Hildred Priest, MD   10 mg at 12/26/15 1711  . Warfarin - Pharmacist Dosing Inpatient   Does not apply [id number] Hildred Priest, MD        Lab Results:  Results for orders placed or performed during the hospital encounter of 12/11/15 (from the past 48 hour(s))  Glucose, capillary     Status: Abnormal   Collection Time: 12/24/15  9:55 PM  Result Value Ref Range   Glucose-Capillary 113 (H) 65 - 99 mg/dL  Protime-INR     Status: Abnormal   Collection Time: 12/25/15  6:27 AM  Result Value Ref Range   Prothrombin Time 26.2 (H) 11.4 - 15.0 seconds   INR 2.44     Glucose, capillary     Status: Abnormal   Collection Time: 12/25/15  6:38 AM  Result Value Ref Range   Glucose-Capillary 107 (H) 65 - 99 mg/dL  Glucose, capillary     Status: None   Collection Time: 12/25/15 12:49 PM  Result Value Ref Range   Glucose-Capillary 93 65 - 99 mg/dL  Glucose, capillary     Status: Abnormal   Collection Time: 12/25/15  4:51 PM  Result Value Ref Range   Glucose-Capillary 111 (H) 65 - 99 mg/dL   Comment 1 Notify RN   Glucose, capillary     Status: None   Collection Time: 12/25/15  8:36 PM  Result Value Ref Range   Glucose-Capillary 99 65 - 99 mg/dL  Glucose, capillary     Status: None   Collection Time: 12/26/15  6:02 AM  Result Value Ref Range   Glucose-Capillary 82 65 - 99 mg/dL  Protime-INR     Status: Abnormal   Collection Time: 12/26/15  6:31 AM  Result Value Ref Range   Prothrombin Time 32.3 (H) 11.4 - 15.0 seconds   INR 3.22   Glucose, capillary     Status: Abnormal   Collection Time: 12/26/15 11:24 AM  Result Value Ref Range   Glucose-Capillary 159 (H) 65 - 99 mg/dL  Glucose, capillary     Status: Abnormal   Collection Time: 12/26/15  4:37 PM  Result Value Ref Range   Glucose-Capillary 101 (H) 65 - 99 mg/dL    Blood Alcohol level:  Lab Results  Component Value Date   ETH <5 [id number]    Metabolic Disorder Labs: Lab Results  Component Value Date   HGBA1C 8.3 (H) 12/04/2015   MPG 192 12/04/2015   MPG 318 (H) 08/24/2010   Lab Results  Component Value Date   PROLACTIN 4.4 12/11/2015   Lab Results  Component Value Date   CHOL 143 12/11/2015   TRIG 286 (H) 12/11/2015   HDL 32 (L) 12/11/2015   CHOLHDL 4.5 12/11/2015   VLDL 57 (H) 12/11/2015   LDLCALC 54 12/11/2015   LDLCALC 56 12/05/2015    Physical Findings: AIMS:  , ,  ,  ,    CIWA:    COWS:     Musculoskeletal: Strength & Muscle Tone: within normal limits Gait & Station: normal Patient leans: N/A  Psychiatric Specialty Exam: Physical Exam  Constitutional: He  appears well-developed and well-nourished.  HENT:  Head: Normocephalic and atraumatic.  Eyes: Conjunctivae are normal. Pupils are equal, round, and reactive to light.  Neck: Normal range of motion.  Cardiovascular: Normal heart sounds.   Respiratory: Effort normal.  GI: Soft.  Musculoskeletal: Normal range of motion.  Neurological: He is alert.  Skin: Skin is warm and dry.     Psychiatric: His affect is labile and inappropriate. His speech is rapid and/or pressured and tangential. He is agitated and hyperactive. Thought content is delusional. Cognition and memory are impaired. He expresses impulsivity. He expresses no suicidal ideation. He is inattentive.    Review of Systems  Constitutional: Negative.   HENT: Negative.   Eyes: Negative.   Respiratory: Negative.   Cardiovascular: Negative.   Gastrointestinal: Negative.   Musculoskeletal: Negative.   Skin: Negative.   Neurological: Negative.   Psychiatric/Behavioral: Positive for memory loss. Negative for depression, hallucinations, substance abuse and suicidal ideas. The patient is nervous/anxious and has insomnia.     Blood pressure 113/74, pulse 79, temperature 98.3 F (36.8 C), temperature source Oral, resp. rate 18, height 6' (1.829 m), weight (!) 137.4 kg (303 lb), SpO2 95 %.Body mass index is 41.09 kg/m.  General Appearance: Disheveled  Eye Contact:  Fair  Speech:  Pressured  Volume:  Increased  Mood:  Euphoric  Affect:  Inappropriate, Labile and Full Range  Thought Process:  Irrelevant  Orientation:  Full (Time, Place, and Person)  Thought Content:  Illogical  Suicidal Thoughts:  No  Homicidal Thoughts:  No  Memory:  Immediate;   Fair Recent;   Poor Remote;   Fair  Judgement:  Fair  Insight:  Good and Fair  Psychomotor Activity:  Increased and Restlessness  Concentration:  Concentration: Poor  Recall:  [place] of Knowledge:  Good  Language:  Good  Akathisia:  No  Handed:  Right  AIMS (if indicated):       Assets:  Agricultural consultant Housing Resilience Social Support  ADL's:  Intact  Cognition:  WNL  Sleep:  Number of Hours: 4.75     Treatment Plan Summary: Daily contact with patient to assess and evaluate symptoms and progress in treatment, Medication management and Plan I'm putting him on the schedule for ECT tomorrow. I have left a message with his wife hoping that she would be able to come and convince him to get treatment. I have also left a message at Dr. Reuel Derby office updating him on the current situation. I have ordered a carbamazepine level to be done tomorrow morning. No other changes to medicine right now. Spends some time with him this evening trying to establish rapport and see how good his insight is but the longer we talked the more disorganized he got. He is still showing a lot of flight of ideas grandiosity and very irrational thinking.  Alethia Berthold, MD 12/26/2015, 5:53 PM

## 2015-12-26 NOTE — Progress Notes (Signed)
Patient on the phones talking to family. Venting and stating that he will not agree to ECT treatment.

## 2015-12-26 NOTE — Progress Notes (Signed)
D: Patient is still very paranoid, intrusive, and delusional. He had to be redirected multiple times tonight. He denies SI/HI/AVH currently.  A: Medication given with education. Encouragement provided.  R: Patient was compliant with medication. He has remained calm and cooperative. Safety maintained with 15 min checks and 1:1 sitter.

## 2015-12-26 NOTE — Progress Notes (Signed)
Patient attended and participated in the 1pm group. Remains hyper verbal with flight of ideas.

## 2015-12-26 NOTE — Progress Notes (Signed)
Patient taking a walk on the hallways with 1:1 staff present. Patient complained of pain and feeling agitated/anxiuos. PRN medication administered (Tylenol 650mg  and Ativan 2mg  PO) with positive effects(see MAR).

## 2015-12-26 NOTE — Progress Notes (Signed)
ANTICOAGULATION CONSULT NOTE - Follow Up Consult  Pharmacy Consult for LMWH/VKA Indication: VTE treatment  Allergies  Allergen Reactions  . Asa [Aspirin] Other (See Comments)    Patient tolerates LOW DOSE ASPIRIN.    Patient Measurements: Height: 6' (182.9 cm) Weight: (!) 303 lb (137.4 kg) IBW/kg (Calculated) : 77.6  Vital Signs: Temp: 98.3 F (36.8 C) (07/25 0651) Temp Source: Oral (07/25 0651) BP: 113/74 (07/25 0651) Pulse Rate: 79 (07/25 0651)  Labs:  Recent Labs  12/24/15 0816 12/25/15 0627 12/26/15 0631  LABPROT 27.5* 26.2* 32.3*  INR 2.60 2.44 3.22   Estimated Creatinine Clearance: 78.4 mL/min (by C-G formula based on SCr of 1.6 mg/dL).  Medical History: Past Medical History:  Diagnosis Date  . Bipolar 1 disorder ([place])   . CKD (chronic kidney disease), stage III   . Diabetes mellitus without complication ([place])   . DVT (deep venous thrombosis) ([place])   . Hypercholesteremia   . Hypertension    Medications:  Warfarin 10 mg po daily  Assessment: 30 yom admitted to ED B[facility] with manic symptoms. Recently discharged from [facility] with DVT/PE, had been bridging LMWH and VKA prior to admission.  INR History: 7/9: INR - 1.99 7/10: no INR- warfarin 12.5 mg po daily at 0200 7/11: INR - 2.97- warfarin held 7/12: INR - 1.92 - warfarin 10 mg 7/13: INR - 1.95 - warfarin 10 mg 7/14: INR - 2.56 - warfarin 5 mg 7/15: INR - 2.73 - warfarin 5 mg 7/16: INR - 2.39 - warfarin 7.5 mg 7/17: INR - 2.22 - warfarin 7.5 mg 7/18: INR - 2.53 - warfarin 7.5 mg 7/19: INR - 2.75 - warfarin 7.5 mg 7/20: INR - 2.99 - warfarin 6.5 mg 7/21: INR - 2.64 - warfarin 6.5 mg  7/22: INR: 2.44; warfarin 8  7/23: INR: 2.60;  Warfarin 10mg  7/24: INR 2.44 - warfarin 10mg  7/25:  INR 3.22  Goal of Therapy:  INR 2-3 Monitor platelets by anticoagulation protocol: Yes   Plan:  INR slightly elevated.  Will change dose to 8mg  daily.  Patient with orders for carbamazepine which interacts with  warfarin to decrease the INR.  Will follow INR closely.   Pharmacy will continue to follow.   Montrell Cessna K, RPh 12/26/2015,11:25 AM

## 2015-12-26 NOTE — Progress Notes (Signed)
Medications administered, patient cooperative with administration. No cheeking noted. Attended and participated appropriately in community meeting.

## 2015-12-26 NOTE — Progress Notes (Signed)
Patient in the dayroom watching TV, dancing and talking to peer. BG checked=159. 2 Units of Novolog given at 1200. Patient cooperative.

## 2015-12-26 NOTE — Progress Notes (Signed)
Patient in room, hyper verbal, disorganized and grandiose in thought, whistling, fidgety with his papers and books.

## 2015-12-26 NOTE — Progress Notes (Signed)
Patient remains hyper verbal, dosing off in room but keeps waking up to talk. Patient took walks in the hallways with 1:1 staff present. No Falls noted.

## 2015-12-26 NOTE — Progress Notes (Signed)
Patient no longer on 1:1 sitter. Patient int he dayroom interacting with staff and peers.

## 2015-12-26 NOTE — Progress Notes (Signed)
Patient continues to have lots to talk about, keeps dosing between his talks, wakes up and continues to talk. Escorted patient to the dayroom, observed to be dancing and talking to staff and peers. Ate breakfast.

## 2015-12-26 NOTE — Progress Notes (Signed)
Patient talkative this morning, states that he literally talked himself to sleep last night. Patient requesting to eat breakfast in the back hall dining room. He states that he doesn't like eating in the regular dayroom because other peers are usually so happy and they look at him like a "villain." Patient jumping from topic to topic, "Have you ever noticed the world is full of triangles? Yeah, spilt a square in half and that's a triangle."

## 2015-12-26 NOTE — Progress Notes (Signed)
Patient upset after he spoke to the MD. Patient redirected and reassured. BG at 4:30pm =101. 45 Units of scheduled Lantus given. Patient insisted on calling Dr. Thurmond Butts.

## 2015-12-26 NOTE — Progress Notes (Signed)
Attended and participated in group.

## 2015-12-27 ENCOUNTER — Encounter: Payer: Self-pay | Admitting: *Deleted

## 2015-12-27 ENCOUNTER — Inpatient Hospital Stay: Payer: 59 | Admitting: Certified Registered Nurse Anesthetist

## 2015-12-27 LAB — GLUCOSE, CAPILLARY
GLUCOSE-CAPILLARY: 114 mg/dL — AB (ref 65–99)
GLUCOSE-CAPILLARY: 103 mg/dL — AB (ref 65–99)
Glucose-Capillary: 110 mg/dL — ABNORMAL HIGH (ref 65–99)
Glucose-Capillary: 83 mg/dL (ref 65–99)

## 2015-12-27 LAB — CARBAMAZEPINE LEVEL, TOTAL: CARBAMAZEPINE LVL: 4.6 ug/mL (ref 4.0–12.0)

## 2015-12-27 LAB — PROTIME-INR
INR: 3.08
Prothrombin Time: 32.5 seconds — ABNORMAL HIGH (ref 11.4–15.2)

## 2015-12-27 MED ORDER — METOPROLOL TARTRATE 5 MG/5ML IV SOLN
INTRAVENOUS | Status: DC | PRN
  Administered 2015-12-27: 2 mg via INTRAVENOUS

## 2015-12-27 MED ORDER — SUCCINYLCHOLINE CHLORIDE 20 MG/ML IJ SOLN
INTRAMUSCULAR | Status: DC | PRN
  Administered 2015-12-27: 150 mg via INTRAVENOUS

## 2015-12-27 MED ORDER — METHOHEXITAL SODIUM 100 MG/10ML IV SOSY
PREFILLED_SYRINGE | INTRAVENOUS | Status: DC | PRN
  Administered 2015-12-27: 100 mg via INTRAVENOUS

## 2015-12-27 MED ORDER — WARFARIN SODIUM 4 MG PO TABS
8.0000 mg | ORAL_TABLET | Freq: Every day | ORAL | Status: DC
  Administered 2015-12-27 – 2015-12-30 (×4): 8 mg via ORAL
  Filled 2015-12-27 (×4): qty 2

## 2015-12-27 MED ORDER — DEXTROSE 5 % IV SOLN: 250.0000 mL | Freq: Once | INTRAVENOUS | Status: DC

## 2015-12-27 NOTE — Procedures (Signed)
ECT SERVICES Physician's Interval Evaluation & Treatment Note  Patient Identification: Cameron [name] MRN:  [id number] Date of Evaluation:  12/27/2015 TX #: 2  MADRS:   MMSE:   P.E. Findings:  Lungs and heart remain clear vital signs stable no new findings. Legs are still inflamed but no worse.  Psychiatric Interval Note:  Continues to be manic hyperverbal hyperactive but not threatening or angry  Subjective:  Patient is a 50 y.o. male seen for evaluation for Electroconvulsive Therapy. Really has no subjective complaint. Poor insight.  Treatment Summary:   []   Right Unilateral             [x]  Bilateral   % Energy : 1.0 ms 100%   Impedance: 670 ohms  Seizure Energy Index: 1529 V squared  Postictal Suppression Index: No reading obtained  Seizure Concordance Index: 95%  Medications  Pre Shock: Brevital 100 mg succinylcholine 150 mg  Post Shock:    Seizure Duration: 12 seconds by EMG, probably 59 seconds by EEG by my reading   Comments: Patient once again seems to of tolerated treatment well. We would like to continue with this as we try to press on towards getting him through this manic episode.   Lungs:  [x]   Clear to auscultation               []  Other:   Heart:    [x]   Regular rhythm             []  irregular rhythm    [x]   Previous H&P reviewed, patient examined and there are NO CHANGES                 []   Previous H&P reviewed, patient examined and there are changes noted.   Alethia Berthold, MD 7/26/201711:48 AM

## 2015-12-27 NOTE — Anesthesia Postprocedure Evaluation (Signed)
Anesthesia Post Note  Patient: Cameron [name]  Procedure(s) Performed: * No procedures listed *  Patient location during evaluation: PACU Anesthesia Type: General Level of consciousness: awake and alert Pain management: pain level controlled Vital Signs Assessment: post-procedure vital signs reviewed and stable Respiratory status: spontaneous breathing, nonlabored ventilation and respiratory function stable Cardiovascular status: blood pressure returned to baseline and stable Postop Assessment: no signs of nausea or vomiting Anesthetic complications: no    Last Vitals:  Vitals:   12/27/15 1224 12/27/15 1235  BP: 109/70 115/74  Pulse: 95 96  Resp: (!) 21 17  Temp:      Last Pain:  Vitals:   12/27/15 1215  TempSrc:   PainSc: 0-No pain                 Shakevia Sarris

## 2015-12-27 NOTE — Progress Notes (Signed)
Post 1:1 D/C note Patient calm and cooperative. Still hyperverbal and paranoid but no aggressive behaviors in the milieu.

## 2015-12-27 NOTE — Progress Notes (Signed)
Post 1:1 D/C progress note Patient calm and cooperative. Currently in room asleep.

## 2015-12-27 NOTE — Anesthesia Procedure Notes (Signed)
Performed by: Colburn Asper Pre-anesthesia Checklist: Patient identified, Patient being monitored, Timeout performed, Emergency Drugs available and Suction available Patient Re-evaluated:Patient Re-evaluated prior to inductionOxygen Delivery Method: Circle system utilized Preoxygenation: Pre-oxygenation with 100% oxygen Ventilation: Mask ventilation without difficulty Airway Equipment and Method: Bite block Dental Injury: Teeth and Oropharynx as per pre-operative assessment        

## 2015-12-27 NOTE — Anesthesia Preprocedure Evaluation (Signed)
Anesthesia Evaluation  Patient identified by MRN, date of birth, ID band Patient awake    Reviewed: Allergy & Precautions, NPO status , Patient's Chart, lab work & pertinent test results  History of Anesthesia Complications Negative for: history of anesthetic complications  Airway Mallampati: II  TM Distance: >3 FB Neck ROM: Full    Dental no notable dental hx.    Pulmonary neg COPD, PE (hx of PE, on anticoagulation)   breath sounds clear to auscultation- rhonchi (-) wheezing      Cardiovascular hypertension, Pt. on medications (-) angina+ Peripheral Vascular Disease  (-) CAD and (-) Past MI  Rhythm:Regular Rate:Normal - Systolic murmurs and - Diastolic murmurs    Neuro/Psych PSYCHIATRIC DISORDERS Bipolar Disorder negative neurological ROS     GI/Hepatic negative GI ROS, Neg liver ROS,   Endo/Other  diabetes, Well Controlled, Insulin Dependent  Renal/GU CRFRenal disease (baseline Cr 1.6)     Musculoskeletal negative musculoskeletal ROS (+)   Abdominal (+) + obese,   Peds  Hematology negative hematology ROS (+)   Anesthesia Other Findings Past Medical History: No date: Bipolar 1 disorder (HCC) No date: CKD (chronic kidney disease), stage III No date: Diabetes mellitus without complication (HCC) No date: DVT (deep venous thrombosis) (HCC) No date: Hypercholesteremia No date: Hypertension   Reproductive/Obstetrics                             Anesthesia Physical  Anesthesia Plan  ASA: III  Anesthesia Plan: General   Post-op Pain Management:    Induction: Intravenous  Airway Management Planned: Mask  Additional Equipment:   Intra-op Plan:   Post-operative Plan:   Informed Consent: I have reviewed the patients History and Physical, chart, labs and discussed the procedure including the risks, benefits and alternatives for the proposed anesthesia with the patient or authorized  representative who has indicated his/her understanding and acceptance.     Plan Discussed with: Anesthesiologist and CRNA  Anesthesia Plan Comments:         Anesthesia Quick Evaluation

## 2015-12-27 NOTE — Progress Notes (Signed)
D: Patient has been very intrusive and paranoid. He keeps making statements such as, "I know what's going on around here." He denies SI/HI/AVH. He reports having a HA. Patient needed constant redirection,  A: Medication was given with education. Encouragement was provided. Tylenol given PRN.  R: Patient was compliant with medication. He has remained calm and cooperative. Safety maintained with 15 min checks.

## 2015-12-27 NOTE — Plan of Care (Signed)
Problem: Coping: Goal: Ability to cope will improve Outcome: Not Progressing Patient continues to be hyper verbal & intrusive.

## 2015-12-27 NOTE — Progress Notes (Signed)
Patient tolerated ECT.Ambulated in hallway.Had lunch.Patient continues to be hyperverbal and intrusive at times.While talking patient was in tears.Patient states "it is tears of joy."Compliant with medications.Attended groups.

## 2015-12-27 NOTE — Transfer of Care (Signed)
Immediate Anesthesia Transfer of Care Note  Patient: Cameron [name]  Procedure(s) Performed: * No procedures listed *  Patient Location: PACU  Anesthesia Type:General  Level of Consciousness: awake and alert   Airway & Oxygen Therapy: Patient Spontanous Breathing and Patient connected to face mask oxygen  Post-op Assessment: Report given to RN and Post -op Vital signs reviewed and stable  Post vital signs: Reviewed and stable  Last Vitals:  Vitals:   12/27/15 0710 12/27/15 0925  BP: 126/79 138/82  Pulse: 89 99  Resp: 18 18  Temp: 36.7 C 37.3 C    Last Pain:  Vitals:   12/27/15 0931  TempSrc:   PainSc: 7       Patients Stated Pain Goal: 0 ([id number])  Complications: No apparent anesthesia complications

## 2015-12-27 NOTE — [facility] Group Notes (Signed)
[place] LCSW Group Therapy   12/27/2015  9:30am  Type of Therapy: Group Therapy   Participation Level: Did Not Attend. Patient invited to participate but declined.    [name]. Juleon Narang, MSW, LCSWA, LCAS

## 2015-12-27 NOTE — Plan of Care (Signed)
Problem: Activity: Goal: Risk for activity intolerance will decrease Outcome: Progressing Patient paces around unit.

## 2015-12-27 NOTE — H&P (Signed)
Cameron [name] is an 50 y.o. male.   Chief Complaint: Patient does not really have a specific complaint HPI: Bipolar disorder continues to be manic. Disorganized hyperverbal grandiose. Poor insight.  Past Medical History:  Diagnosis Date  . Bipolar 1 disorder ([place])   . CKD (chronic kidney disease), stage III   . Diabetes mellitus without complication ([place])   . DVT (deep venous thrombosis) ([place])   . Hypercholesteremia   . Hypertension     Past Surgical History:  Procedure Laterality Date  . APPENDECTOMY      Family History  Problem Relation Age of Onset  . Stroke Other   . Diabetes Other    Social History:  reports that he has never smoked. He does not have any smokeless tobacco history on file. He reports that he drinks alcohol. He reports that he does not use drugs.  Allergies:  Allergies  Allergen Reactions  . Asa [Aspirin] Other (See Comments)    Patient tolerates LOW DOSE ASPIRIN.    Medications Prior to Admission  Medication Sig Dispense Refill  . ARIPiprazole (ABILIFY) 30 MG tablet Take 30 mg by mouth every evening.     Marland Kitchen atorvastatin (LIPITOR) 40 MG tablet Take 40 mg by mouth daily.  11  . Canagliflozin (INVOKANA) 100 MG TABS Take 100 mg by mouth daily with breakfast.     . carbamazepine (TEGRETOL) 200 MG tablet Take 400-600 mg by mouth 2 (two) times daily. Take 2 tablets in the morning Take 3 tablets in the evening    . enalapril (VASOTEC) 5 MG tablet Take 5 mg by mouth 2 (two) times daily.    Marland Kitchen enoxaparin (LOVENOX) 150 MG/ML injection Inject 1 mL (150 mg total) into the skin every 12 (twelve) hours. 8 Syringe 0  . fenofibrate 160 MG tablet Take 160 mg by mouth daily.    . Insulin Glargine (LANTUS SOLOSTAR) 100 UNIT/ML Solostar Pen Inject 60 Units into the skin daily.     Marland Kitchen lamoTRIgine (LAMICTAL) 100 MG tablet Take 400 mg by mouth at bedtime.    . Multiple Vitamin (MULTIVITAMIN WITH MINERALS) TABS tablet Take 1 tablet by mouth daily.    . QUEtiapine (SEROQUEL)  400 MG tablet Take 800 mg by mouth at bedtime.    Marland Kitchen TANZEUM 50 MG PEN Inject 50 mg into the skin every 7 (seven) days.  2  . temazepam (RESTORIL) 15 MG capsule Take 15 mg by mouth at bedtime as needed for sleep.    Marland Kitchen warfarin (COUMADIN) 5 MG tablet Take 2 tablets (10 mg total) by mouth daily. 60 tablet 0    Results for orders placed or performed during the hospital encounter of 12/11/15 (from the past 48 hour(s))  Glucose, capillary     Status: None   Collection Time: 12/25/15 12:49 PM  Result Value Ref Range   Glucose-Capillary 93 65 - 99 mg/dL  Glucose, capillary     Status: Abnormal   Collection Time: 12/25/15  4:51 PM  Result Value Ref Range   Glucose-Capillary 111 (H) 65 - 99 mg/dL   Comment 1 Notify RN   Glucose, capillary     Status: None   Collection Time: 12/25/15  8:36 PM  Result Value Ref Range   Glucose-Capillary 99 65 - 99 mg/dL  Glucose, capillary     Status: None   Collection Time: 12/26/15  6:02 AM  Result Value Ref Range   Glucose-Capillary 82 65 - 99 mg/dL  Protime-INR     Status:  Abnormal   Collection Time: 12/26/15  6:31 AM  Result Value Ref Range   Prothrombin Time 32.3 (H) 11.4 - 15.0 seconds   INR 3.22   Glucose, capillary     Status: Abnormal   Collection Time: 12/26/15 11:24 AM  Result Value Ref Range   Glucose-Capillary 159 (H) 65 - 99 mg/dL  Glucose, capillary     Status: Abnormal   Collection Time: 12/26/15  4:37 PM  Result Value Ref Range   Glucose-Capillary 101 (H) 65 - 99 mg/dL  Glucose, capillary     Status: Abnormal   Collection Time: 12/26/15  8:41 PM  Result Value Ref Range   Glucose-Capillary 107 (H) 65 - 99 mg/dL   Comment 1 Notify RN   Glucose, capillary     Status: Abnormal   Collection Time: 12/27/15  6:37 AM  Result Value Ref Range   Glucose-Capillary 110 (H) 65 - 99 mg/dL   Comment 1 Notify RN   Protime-INR     Status: Abnormal   Collection Time: 12/27/15  8:39 AM  Result Value Ref Range   Prothrombin Time 32.5 (H) 11.4 -  15.2 seconds   INR 3.08   Carbamazepine level, total     Status: None   Collection Time: 12/27/15  8:39 AM  Result Value Ref Range   Carbamazepine Lvl 4.6 4.0 - 12.0 ug/mL   No results found.  Review of Systems  Constitutional: Negative.   HENT: Negative.   Eyes: Negative.   Respiratory: Negative.   Cardiovascular: Negative.   Gastrointestinal: Negative.   Musculoskeletal: Negative.   Skin: Negative.   Neurological: Negative.   Psychiatric/Behavioral: Positive for memory loss. Negative for depression, hallucinations, substance abuse and suicidal ideas. The patient is nervous/anxious and has insomnia.     Blood pressure 138/82, pulse 99, temperature 99.2 F (37.3 C), temperature source Tympanic, resp. rate 18, height 6' (1.829 m), weight 136.1 kg (300 lb), SpO2 98 %. Physical Exam  Nursing note and vitals reviewed. Constitutional: He appears well-developed and well-nourished.  HENT:  Head: Normocephalic and atraumatic.  Eyes: Conjunctivae are normal. Pupils are equal, round, and reactive to light.  Neck: Normal range of motion.  Cardiovascular: Regular rhythm and normal heart sounds.   Respiratory: Effort normal and breath sounds normal. No respiratory distress.  GI: Soft.  Musculoskeletal: Normal range of motion.  Neurological: He is alert.  Skin: Skin is warm and dry.  Psychiatric: His affect is labile and inappropriate. His speech is rapid and/or pressured and tangential. He is agitated and hyperactive. Thought content is delusional. Cognition and memory are impaired. He expresses impulsivity. He expresses no suicidal ideation.     Assessment/Plan Patient tolerated previous treatment fine. Treatment today. We are hoping to continue 3 times a week as part of improvement plan for his bipolar disorder.  Alethia Berthold, MD 12/27/2015, 11:46 AM

## 2015-12-27 NOTE — Progress Notes (Signed)
Post D/C of 1:1 progress note Patient calm and cooperative. Currently asleep.

## 2015-12-27 NOTE — Tx Team (Signed)
Interdisciplinary Treatment Plan Update (Adult)        Date: 12/27/2015   Time Reviewed: 9:30 AM   Progress in Treatment: Improving  Attending groups: Yes Participating in groups: Yes Taking medication as prescribed: Yes  Tolerating medication: Yes  Family/Significant other contact made:Yes, CSW spoke to the pt's wife Johntae Broxterman at ph: [phone] option 4, Ext [phone] Patient understands diagnosis: Yes  Discussing patient identified problems/goals with staff: Yes  Medical problems stabilized or resolved: Yes  Denies suicidal/homicidal ideation: Yes  Issues/concerns per patient self-inventory: Yes  Other:   New problem(s) identified: N/A   Discharge Plan or Barriers: Pt will discharge home to [facility] to live with his family and will follow up with [facility] for medication management and with [facility] for therapy   Reason for Continuation of Hospitalization:   Depression   Anxiety   Medication Stabilization   Comments: N/A   Estimated length of stay: 3-5 days     Patient is a 50 year old male with a history of bipolar disorder.  Pt's chief complaint. "I committed myself."   History of present illness. Information was obtained from the patient and the chart. The patient has a long history of bipolar Disorder with multiple psychiatric hospitalizations he has been stable since 2009 on a combination of Abilify, Seroquel, Tegretol, Lamictal, and Restoril in the care of dr. [facility], his primary psychiatrist. The patient has been compliant with medications and doctors appointments. On July 2 he was admitted to medical floor for DVT and pulmonary embolism. At that time some of his medications were discontinued or the doses were lowered. The patient started developing manic symptoms. He became insomniac, hyperactive, hyperverbal and possibly psychotic. He called exterminators to his house twice to deal with critters that he could see on the walls. The wife  suggested that he comes to the hospital and the patient agreed. He is very proud of himself that he committed himself. He is very proud of the fact the years he developed good coping skills. This includes good treatment compliance as well as ability to listen and not to interrupt. He denies symptoms of anxiety. There are no alcohol or drugs involved.   Past psychiatric history. Long history of bipolar mania. He used to be hospitalized every 2 years for severe manic episode that it will require extended hospitalizations and ECT treatments. His last episode was in 2009. She received ECT treatment according to the patient flipped him to depression.    Family psychiatric history. Daughter with bipolar and ADHD.   Social history. He experienced several stressors in the pas ttwo years, sickness of his mother and job loss, that did not result in exacerbation of his illness. Recent hospital admission were some omissions in his medication regimen were made brought him to the hospital again. 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 . Patient lives in [place]. Patient will benefit from crisis stabilization, medication evaluation, group therapy, and psycho education in addition to case management for discharge planning. Patient and CSW reviewed pt's identified goals and treatment plan. Pt verbalized understanding and agreed to treatment plan.    Review of initial/current patient goals per problem list:  1. Goal(s): Patient will participate in aftercare plan   Met: Yes Target date: 3-5 days post admission date   As evidenced by: Patient will participate within aftercare plan AEB aftercare provider and housing plan at discharge being identified.   7/12: CSW assessing for appropriate contacts  7/18:  Pt will discharge home to [facility] to live with his family and will follow up with [facility] for medication management and with [facility] for therapy   2. .  Goal(s): Patient will demonstrate decreased signs of psychosis  * Met: No * Target date: 3-5 days post admission date  * As evidenced by: Patient will demonstrate decreased frequency of AVH or return to baseline function   7/12: Goal progressing.  7/18: Goal progressing.  7/20: Goal progressing.  7/26: Goal progressing.   3. Goal (s): Patient will demonstrate decreased signs of mania  * Met: No * Target date: 3-5 days post admission date  * As evidenced by: Patient demonstrate decreased signs of mania AEB decreased mood instability and demonstration of stable mood   7/12: Goal progressing.  7/18: Goal progressing.  7/20: Goal progressing.  7/26: Goal progressing.  Attendees:  Patient: Cameron [name] Family:  Physician: Dr. Algie Coffer, MD    12/27/2015 9:30 AM  Nursing: Elige Radon, RN     12/27/2015 3PM  Clinical Social Worker: Marylou Flesher, [place]  12/27/2015 3PM  Clinical Social Worker:   , [place]   12/27/2015 3PM  Other: , NP       12/27/2015 AM  Other:        7/26/2017AM  Other:        12/27/2015  AM   [name]. Cassidy Tabet, LCSWA, LCAS  12/27/15

## 2015-12-27 NOTE — Progress Notes (Signed)
Cameron [name] is still very manic, intrusive, aimlessly walking around, attention seeking still, disorganized, inattentive, disorganized and highly unpredictable. Wife and daughter were here during visitation, Cameron [name] just go about pacing the unit and into everyone's business, leaving his family alone in the Day Room. Wife said "he is social butterfly tonight.Marland KitchenMarland Kitchen"

## 2015-12-27 NOTE — Progress Notes (Signed)
[facility] MD Progress Note  12/27/2015 5:16 PM Cameron [name]  MRN:  [id number] Subjective:  This is a 50 year old man with bipolar disorder currently in a manic phase. 2 weeks into his hospitalization. In addition to manic bipolar disorder also has a history of a pulmonary embolism related to deep vein thromboses, diabetes, cellulitis, elevated cholesterol  Follow-up Wednesday the 26th. Patient agreed to and had an ECT treatment this morning. Treatment went fine no complications. Seen this afternoon the patient continues to be hyperverbal and disorganized and show flight of ideas but he is much less agitated and much less angry than he was. I think we are seeing daily improvement.  Today's INR is 3. Principal Problem: Bipolar I disorder, current or most recent episode manic, with psychotic features ([place]) Diagnosis:   Patient Active Problem List   Diagnosis Date Noted  . Bipolar I disorder, current or most recent episode manic, with psychotic features ([place]) [F31.2] 12/11/2015  . Pulmonary embolism ([place]) [I26.99] 12/04/2015  . Diabetes mellitus without complication ([place]) [id number]   . Hypercholesteremia [E78.00]   . CKD (chronic kidney disease), stage III [N18.3]   . Acute deep vein thrombosis (DVT) of femoral vein of right lower extremity (HCC) [I82.411]    Total Time spent with patient: 30 minutes  Past Psychiatric History: Long-standing history of bipolar disorder with multiple episodes of mania. Also has had depression. Very sensitive to changes in his medicine.  Past Medical History:  Past Medical History:  Diagnosis Date  . Bipolar 1 disorder ([place])   . CKD (chronic kidney disease), stage III   . Diabetes mellitus without complication ([place])   . DVT (deep venous thrombosis) ([place])   . Hypercholesteremia   . Hypertension     Past Surgical History:  Procedure Laterality Date  . APPENDECTOMY     Family History:  Family History  Problem Relation Age of Onset  . Stroke Other   .  Diabetes Other    Family Psychiatric  History: Positive for bipolar disorder in at least 2 first-degree relatives Social History:  History  Alcohol Use  . Yes    Comment: rarely     History  Drug Use No    Social History   Social History  . Marital status: Married    Spouse name: N/A  . Number of children: N/A  . Years of education: N/A   Social History Main Topics  . Smoking status: Never Smoker  . Smokeless tobacco: None  . Alcohol use Yes     Comment: rarely  . Drug use: No  . Sexual activity: Not Asked   Other Topics Concern  . None   Social History Narrative  . None   Additional Social History:                         Sleep: Poor  Appetite:  Good  Current Medications: Current Facility-Administered Medications  Medication Dose Route Frequency Provider Last Rate Last Dose  . 0.9 %  sodium chloride infusion   Intravenous Continuous Gonzella Lex, MD 50 mL/hr at 12/27/15 0955    . acetaminophen (TYLENOL) tablet 650 mg  650 mg Oral Q6H PRN Hildred Priest, MD   650 mg at 12/27/15 1318  . alum & mag hydroxide-simeth (MAALOX/MYLANTA) 200-200-20 MG/5ML suspension 30 mL  30 mL Oral Q4H PRN Hildred Priest, MD      . ARIPiprazole (ABILIFY) tablet 30 mg  30 mg Oral QPM Hildred Priest, MD  30 mg at 12/26/15 1712  . atorvastatin (LIPITOR) tablet 40 mg  40 mg Oral Daily Hildred Priest, MD   40 mg at 12/27/15 1315  . canagliflozin (INVOKANA) tablet 100 mg  100 mg Oral BH-q7a Clovis Fredrickson, MD   100 mg at 12/27/15 [id number]  . carbamazepine (TEGRETOL XR) 12 hr tablet 600 mg  600 mg Oral QHS Jolanta B Pucilowska, MD   600 mg at 12/26/15 2155  . carbamazepine (TEGRETOL) tablet 400 mg  400 mg Oral Q breakfast Hildred Priest, MD   400 mg at 12/27/15 1315  . dextrose 5 % solution 250 mL  250 mL Intravenous Once Gonzella Lex, MD      . enalapril (VASOTEC) tablet 5 mg  5 mg Oral BID Hildred Priest, MD    5 mg at 12/27/15 1008  . fenofibrate tablet 160 mg  160 mg Oral Daily Hildred Priest, MD   160 mg at 12/27/15 1315  . haloperidol (HALDOL) tablet 20 mg  20 mg Oral Q6H PRN Clovis Fredrickson, MD   20 mg at 12/22/15 0249  . insulin aspart (novoLOG) injection 0-5 Units  0-5 Units Subcutaneous QHS Hildred Priest, MD      . insulin aspart (novoLOG) injection 0-9 Units  0-9 Units Subcutaneous TID WC Hildred Priest, MD   Stopped at 12/26/15 1708  . insulin glargine (LANTUS) injection 45 Units  45 Units Subcutaneous Daily Clovis Fredrickson, MD   45 Units at 12/27/15 1636  . lamoTRIgine (LAMICTAL) tablet 400 mg  400 mg Oral QHS Hildred Priest, MD   400 mg at 12/26/15 2155  . LORazepam (ATIVAN) tablet 2 mg  2 mg Oral Q2H PRN Gonzella Lex, MD   2 mg at 12/26/15 1107  . magnesium hydroxide (MILK OF MAGNESIA) suspension 30 mL  30 mL Oral Daily PRN Hildred Priest, MD      . midazolam (VERSED) injection 2 mg  2 mg Intravenous Once Gonzella Lex, MD      . QUEtiapine (SEROQUEL) tablet 800 mg  800 mg Oral QHS Hildred Priest, MD   800 mg at 12/26/15 2154  . temazepam (RESTORIL) capsule 30 mg  30 mg Oral QHS Clovis Fredrickson, MD   30 mg at 12/26/15 2154  . warfarin (COUMADIN) tablet 8 mg  8 mg Oral q1800 Gonzella Lex, MD      . Warfarin - Pharmacist Dosing Inpatient   Does not apply [id number] Hildred Priest, MD        Lab Results:  Results for orders placed or performed during the hospital encounter of 12/11/15 (from the past 48 hour(s))  Glucose, capillary     Status: None   Collection Time: 12/25/15  8:36 PM  Result Value Ref Range   Glucose-Capillary 99 65 - 99 mg/dL  Glucose, capillary     Status: None   Collection Time: 12/26/15  6:02 AM  Result Value Ref Range   Glucose-Capillary 82 65 - 99 mg/dL  Protime-INR     Status: Abnormal   Collection Time: 12/26/15  6:31 AM  Result Value Ref Range   Prothrombin Time 32.3  (H) 11.4 - 15.0 seconds   INR 3.22   Glucose, capillary     Status: Abnormal   Collection Time: 12/26/15 11:24 AM  Result Value Ref Range   Glucose-Capillary 159 (H) 65 - 99 mg/dL  Glucose, capillary     Status: Abnormal   Collection Time: 12/26/15  4:37 PM  Result Value Ref  Range   Glucose-Capillary 101 (H) 65 - 99 mg/dL  Glucose, capillary     Status: Abnormal   Collection Time: 12/26/15  8:41 PM  Result Value Ref Range   Glucose-Capillary 107 (H) 65 - 99 mg/dL   Comment 1 Notify RN   Glucose, capillary     Status: Abnormal   Collection Time: 12/27/15  6:37 AM  Result Value Ref Range   Glucose-Capillary 110 (H) 65 - 99 mg/dL   Comment 1 Notify RN   Protime-INR     Status: Abnormal   Collection Time: 12/27/15  8:39 AM  Result Value Ref Range   Prothrombin Time 32.5 (H) 11.4 - 15.2 seconds   INR 3.08   Carbamazepine level, total     Status: None   Collection Time: 12/27/15  8:39 AM  Result Value Ref Range   Carbamazepine Lvl 4.6 4.0 - 12.0 ug/mL  Glucose, capillary     Status: None   Collection Time: 12/27/15  1:12 PM  Result Value Ref Range   Glucose-Capillary 83 65 - 99 mg/dL  Glucose, capillary     Status: Abnormal   Collection Time: 12/27/15  4:39 PM  Result Value Ref Range   Glucose-Capillary 114 (H) 65 - 99 mg/dL    Blood Alcohol level:  Lab Results  Component Value Date   ETH <5 [id number]    Metabolic Disorder Labs: Lab Results  Component Value Date   HGBA1C 8.3 (H) 12/04/2015   MPG 192 12/04/2015   MPG 318 (H) 08/24/2010   Lab Results  Component Value Date   PROLACTIN 4.4 12/11/2015   Lab Results  Component Value Date   CHOL 143 12/11/2015   TRIG 286 (H) 12/11/2015   HDL 32 (L) 12/11/2015   CHOLHDL 4.5 12/11/2015   VLDL 57 (H) 12/11/2015   LDLCALC 54 12/11/2015   LDLCALC 56 12/05/2015    Physical Findings: AIMS:  , ,  ,  ,    CIWA:    COWS:     Musculoskeletal: Strength & Muscle Tone: within normal limits Gait & Station:  normal Patient leans: N/A  Psychiatric Specialty Exam: Physical Exam  Nursing note and vitals reviewed. Constitutional: He appears well-developed and well-nourished.  HENT:  Head: Normocephalic and atraumatic.  Eyes: Conjunctivae are normal. Pupils are equal, round, and reactive to light.  Neck: Normal range of motion.  Cardiovascular: Normal heart sounds.   Respiratory: Effort normal.  GI: Soft.  Musculoskeletal: Normal range of motion.  Neurological: He is alert.  Skin: Skin is warm and dry.     Psychiatric: His affect is labile and inappropriate. His speech is rapid and/or pressured and tangential. He is agitated and hyperactive. Thought content is delusional. Cognition and memory are impaired. He expresses impulsivity. He expresses no suicidal ideation. He is inattentive.    Review of Systems  Constitutional: Negative.   HENT: Negative.   Eyes: Negative.   Respiratory: Negative.   Cardiovascular: Negative.   Gastrointestinal: Negative.   Musculoskeletal: Negative.   Skin: Negative.   Neurological: Negative.   Psychiatric/Behavioral: Positive for memory loss. Negative for depression, hallucinations, substance abuse and suicidal ideas. The patient is nervous/anxious and has insomnia.     Blood pressure 124/68, pulse 92, temperature 99.1 F (37.3 C), resp. rate 18, height 6' (1.829 m), weight 136.1 kg (300 lb), SpO2 97 %.Body mass index is 40.69 kg/m.  General Appearance: Disheveled  Eye Contact:  Fair  Speech:  Pressured  Volume:  Increased  Mood:  Euphoric  Affect:  Inappropriate, Labile and Full Range  Thought Process:  Irrelevant  Orientation:  Full (Time, Place, and Person)  Thought Content:  Illogical  Suicidal Thoughts:  No  Homicidal Thoughts:  No  Memory:  Immediate;   Fair Recent;   Poor Remote;   Fair  Judgement:  Fair  Insight:  Good and Fair  Psychomotor Activity:  Increased and Restlessness  Concentration:  Concentration: Poor  Recall:  [place]  of Knowledge:  Good  Language:  Good  Akathisia:  No  Handed:  Right  AIMS (if indicated):     Assets:  Agricultural consultant Housing Resilience Social Support  ADL's:  Intact  Cognition:  WNL  Sleep:  Number of Hours: 5     Treatment Plan Summary: Daily contact with patient to assess and evaluate symptoms and progress in treatment, Medication management and Plan Tegretol level was 4.6 still on the low end. I'm not going to increase it yet however because of the continued use of ECT. Continue current medicine. ECT on Friday. Case reviewed with social work and nursing and we are keeping in touch with his wife as well.  Alethia Berthold, MD 12/27/2015, 5:16 PM

## 2015-12-27 NOTE — Progress Notes (Signed)
ANTICOAGULATION CONSULT NOTE - Follow Up Consult  Pharmacy Consult for LMWH/VKA Indication: VTE treatment  Allergies  Allergen Reactions  . Asa [Aspirin] Other (See Comments)    Patient tolerates LOW DOSE ASPIRIN.    Patient Measurements: Height: 6' (182.9 cm) Weight: 300 lb (136.1 kg) IBW/kg (Calculated) : 77.6  Vital Signs: Temp: 99.2 F (37.3 C) (07/26 0925) Temp Source: Tympanic (07/26 0925) BP: 138/82 (07/26 0925) Pulse Rate: 99 (07/26 0925)  Labs:  Recent Labs  12/25/15 0627 12/26/15 0631 12/27/15 0839  LABPROT 26.2* 32.3* 32.5*  INR 2.44 3.22 3.08   Estimated Creatinine Clearance: 78 mL/min (by C-G formula based on SCr of 1.6 mg/dL).  Medical History: Past Medical History:  Diagnosis Date  . Bipolar 1 disorder ([place])   . CKD (chronic kidney disease), stage III   . Diabetes mellitus without complication ([place])   . DVT (deep venous thrombosis) ([place])   . Hypercholesteremia   . Hypertension    Medications:  Warfarin 10 mg po daily  Assessment: 50 yom admitted to ED B[facility] with manic symptoms. Recently discharged from [facility] with DVT/PE, had been bridging LMWH and VKA prior to admission.  INR History: 7/9: INR - 1.99 7/10: no INR- warfarin 12.5 mg po daily at 0200 7/11: INR - 2.97- warfarin held 7/12: INR - 1.92 - warfarin 10 mg 7/13: INR - 1.95 - warfarin 10 mg 7/14: INR - 2.56 - warfarin 5 mg 7/15: INR - 2.73 - warfarin 5 mg 7/16: INR - 2.39 - warfarin 7.5 mg 7/17: INR - 2.22 - warfarin 7.5 mg 7/18: INR - 2.53 - warfarin 7.5 mg 7/19: INR - 2.75 - warfarin 7.5 mg 7/20: INR - 2.99 - warfarin 6.5 mg 7/21: INR - 2.64 - warfarin 6.5 mg  7/22: INR: 2.44; warfarin 8  7/23: INR: 2.60;  Warfarin 10mg  7/24: INR 2.44 - warfarin 10mg  7/25:  INR 3.22 - warfarin 10mg  7/26:  INR 3.08  Goal of Therapy:  INR 2-3 Monitor platelets by anticoagulation protocol: Yes   Plan:  INR slightly elevated.  Will change to 8mg  daily.  Patient with orders for carbamazepine  which interacts with warfarin to decrease the INR.  Will follow INR closely.   Pharmacy will continue to follow.   Haidan Nhan K, RPh 12/27/2015,10:33 AM

## 2015-12-28 ENCOUNTER — Other Ambulatory Visit: Payer: Self-pay | Admitting: Psychiatry

## 2015-12-28 LAB — GLUCOSE, CAPILLARY
GLUCOSE-CAPILLARY: 154 mg/dL — AB (ref 65–99)
GLUCOSE-CAPILLARY: 132 mg/dL — AB (ref 65–99)
GLUCOSE-CAPILLARY: 116 mg/dL — AB (ref 65–99)
Glucose-Capillary: 135 mg/dL — ABNORMAL HIGH (ref 65–99)

## 2015-12-28 LAB — PROTIME-INR
INR: 3.24
Prothrombin Time: 33.8 seconds — ABNORMAL HIGH (ref 11.4–15.2)

## 2015-12-28 NOTE — Progress Notes (Signed)
[facility] MD Progress Note  12/28/2015 7:22 PM Cameron [name]  MRN:  [id number] Subjective:  This is a 50 year old man with bipolar disorder currently in a manic phase. 2 weeks into his hospitalization. In addition to manic bipolar disorder also has a history of a pulmonary embolism related to deep vein thromboses, diabetes, cellulitis, elevated cholesterol  Follow-up Thursday the 27th. Patient seen chart reviewed. Patient continues to be hyperverbal and tangential and a little grandiose but is not as bizarre as he was previously. He is not aggressive or threatening and for the most part is getting along well with other patients. He admits that he tolerated his last ECT treatment without difficulty. Today's INR is 3. Principal Problem: Bipolar I disorder, current or most recent episode manic, with psychotic features ([place]) Diagnosis:   Patient Active Problem List   Diagnosis Date Noted  . Bipolar I disorder, current or most recent episode manic, with psychotic features ([place]) [F31.2] 12/11/2015  . Pulmonary embolism ([place]) [I26.99] 12/04/2015  . Diabetes mellitus without complication ([place]) [id number]   . Hypercholesteremia [E78.00]   . CKD (chronic kidney disease), stage III [N18.3]   . Acute deep vein thrombosis (DVT) of femoral vein of right lower extremity (HCC) [I82.411]    Total Time spent with patient: 30 minutes  Past Psychiatric History: Long-standing history of bipolar disorder with multiple episodes of mania. Also has had depression. Very sensitive to changes in his medicine.  Past Medical History:  Past Medical History:  Diagnosis Date  . Bipolar 1 disorder ([place])   . CKD (chronic kidney disease), stage III   . Diabetes mellitus without complication ([place])   . DVT (deep venous thrombosis) ([place])   . Hypercholesteremia   . Hypertension     Past Surgical History:  Procedure Laterality Date  . APPENDECTOMY     Family History:  Family History  Problem Relation Age of Onset  . Stroke  Other   . Diabetes Other    Family Psychiatric  History: Positive for bipolar disorder in at least 2 first-degree relatives Social History:  History  Alcohol Use  . Yes    Comment: rarely     History  Drug Use No    Social History   Social History  . Marital status: Married    Spouse name: N/A  . Number of children: N/A  . Years of education: N/A   Social History Main Topics  . Smoking status: Never Smoker  . Smokeless tobacco: None  . Alcohol use Yes     Comment: rarely  . Drug use: No  . Sexual activity: Not Asked   Other Topics Concern  . None   Social History Narrative  . None   Additional Social History:                         Sleep: Poor  Appetite:  Good  Current Medications: Current Facility-Administered Medications  Medication Dose Route Frequency Provider Last Rate Last Dose  . 0.9 %  sodium chloride infusion   Intravenous Continuous Gonzella Lex, MD 50 mL/hr at 12/27/15 0955    . acetaminophen (TYLENOL) tablet 650 mg  650 mg Oral Q6H PRN Hildred Priest, MD   650 mg at 12/27/15 2220  . alum & mag hydroxide-simeth (MAALOX/MYLANTA) 200-200-20 MG/5ML suspension 30 mL  30 mL Oral Q4H PRN Hildred Priest, MD      . ARIPiprazole (ABILIFY) tablet 30 mg  30 mg Oral QPM Hildred Priest, MD  30 mg at 12/28/15 1744  . atorvastatin (LIPITOR) tablet 40 mg  40 mg Oral Daily Hildred Priest, MD   40 mg at 12/28/15 0840  . canagliflozin (INVOKANA) tablet 100 mg  100 mg Oral BH-q7a Clovis Fredrickson, MD   100 mg at 12/28/15 [id number]  . carbamazepine (TEGRETOL XR) 12 hr tablet 600 mg  600 mg Oral QHS Jolanta B Pucilowska, MD   600 mg at 12/27/15 2125  . carbamazepine (TEGRETOL) tablet 400 mg  400 mg Oral Q breakfast Hildred Priest, MD   400 mg at 12/28/15 0840  . dextrose 5 % solution 250 mL  250 mL Intravenous Once Gonzella Lex, MD      . enalapril (VASOTEC) tablet 5 mg  5 mg Oral BID Hildred Priest, MD   5 mg at 12/28/15 0840  . fenofibrate tablet 160 mg  160 mg Oral Daily Hildred Priest, MD   160 mg at 12/28/15 0840  . haloperidol (HALDOL) tablet 20 mg  20 mg Oral Q6H PRN Clovis Fredrickson, MD   20 mg at 12/22/15 0249  . insulin aspart (novoLOG) injection 0-5 Units  0-5 Units Subcutaneous QHS Hildred Priest, MD      . insulin aspart (novoLOG) injection 0-9 Units  0-9 Units Subcutaneous TID WC Hildred Priest, MD   1 Units at 12/28/15 1632  . insulin glargine (LANTUS) injection 45 Units  45 Units Subcutaneous Daily Clovis Fredrickson, MD   45 Units at 12/28/15 1632  . lamoTRIgine (LAMICTAL) tablet 400 mg  400 mg Oral QHS Hildred Priest, MD   400 mg at 12/27/15 2125  . LORazepam (ATIVAN) tablet 2 mg  2 mg Oral Q2H PRN Gonzella Lex, MD   2 mg at 12/27/15 2346  . magnesium hydroxide (MILK OF MAGNESIA) suspension 30 mL  30 mL Oral Daily PRN Hildred Priest, MD      . midazolam (VERSED) injection 2 mg  2 mg Intravenous Once Gonzella Lex, MD      . QUEtiapine (SEROQUEL) tablet 800 mg  800 mg Oral QHS Hildred Priest, MD   800 mg at 12/27/15 2126  . temazepam (RESTORIL) capsule 30 mg  30 mg Oral QHS Clovis Fredrickson, MD   30 mg at 12/27/15 2124  . warfarin (COUMADIN) tablet 8 mg  8 mg Oral q1800 Gonzella Lex, MD   8 mg at 12/28/15 1744  . Warfarin - Pharmacist Dosing Inpatient   Does not apply [id number] Hildred Priest, MD        Lab Results:  Results for orders placed or performed during the hospital encounter of 12/11/15 (from the past 48 hour(s))  Glucose, capillary     Status: Abnormal   Collection Time: 12/26/15  8:41 PM  Result Value Ref Range   Glucose-Capillary 107 (H) 65 - 99 mg/dL   Comment 1 Notify RN   Glucose, capillary     Status: Abnormal   Collection Time: 12/27/15  6:37 AM  Result Value Ref Range   Glucose-Capillary 110 (H) 65 - 99 mg/dL   Comment 1 Notify RN     Protime-INR     Status: Abnormal   Collection Time: 12/27/15  8:39 AM  Result Value Ref Range   Prothrombin Time 32.5 (H) 11.4 - 15.2 seconds   INR 3.08   Carbamazepine level, total     Status: None   Collection Time: 12/27/15  8:39 AM  Result Value Ref Range   Carbamazepine Lvl 4.6 4.0 -  12.0 ug/mL  Glucose, capillary     Status: None   Collection Time: 12/27/15  1:12 PM  Result Value Ref Range   Glucose-Capillary 83 65 - 99 mg/dL  Glucose, capillary     Status: Abnormal   Collection Time: 12/27/15  4:39 PM  Result Value Ref Range   Glucose-Capillary 114 (H) 65 - 99 mg/dL  Glucose, capillary     Status: Abnormal   Collection Time: 12/27/15  8:04 PM  Result Value Ref Range   Glucose-Capillary 103 (H) 65 - 99 mg/dL  Protime-INR     Status: Abnormal   Collection Time: 12/28/15  6:26 AM  Result Value Ref Range   Prothrombin Time 33.8 (H) 11.4 - 15.2 seconds   INR 3.24   Glucose, capillary     Status: Abnormal   Collection Time: 12/28/15  6:40 AM  Result Value Ref Range   Glucose-Capillary 135 (H) 65 - 99 mg/dL  Glucose, capillary     Status: Abnormal   Collection Time: 12/28/15 11:30 AM  Result Value Ref Range   Glucose-Capillary 154 (H) 65 - 99 mg/dL  Glucose, capillary     Status: Abnormal   Collection Time: 12/28/15  4:29 PM  Result Value Ref Range   Glucose-Capillary 132 (H) 65 - 99 mg/dL    Blood Alcohol level:  Lab Results  Component Value Date   ETH <5 [id number]    Metabolic Disorder Labs: Lab Results  Component Value Date   HGBA1C 8.3 (H) 12/04/2015   MPG 192 12/04/2015   MPG 318 (H) 08/24/2010   Lab Results  Component Value Date   PROLACTIN 4.4 12/11/2015   Lab Results  Component Value Date   CHOL 143 12/11/2015   TRIG 286 (H) 12/11/2015   HDL 32 (L) 12/11/2015   CHOLHDL 4.5 12/11/2015   VLDL 57 (H) 12/11/2015   LDLCALC 54 12/11/2015   LDLCALC 56 12/05/2015    Physical Findings: AIMS:  , ,  ,  ,    CIWA:    COWS:      Musculoskeletal: Strength & Muscle Tone: within normal limits Gait & Station: normal Patient leans: N/A  Psychiatric Specialty Exam: Physical Exam  Nursing note and vitals reviewed. Constitutional: He appears well-developed and well-nourished.  HENT:  Head: Normocephalic and atraumatic.  Eyes: Conjunctivae are normal. Pupils are equal, round, and reactive to light.  Neck: Normal range of motion.  Cardiovascular: Normal heart sounds.   Respiratory: Effort normal.  GI: Soft.  Musculoskeletal: Normal range of motion.  Neurological: He is alert.  Skin: Skin is warm and dry.     Psychiatric: His affect is labile and inappropriate. His speech is rapid and/or pressured and tangential. He is agitated and hyperactive. Thought content is delusional. Cognition and memory are impaired. He expresses impulsivity. He expresses no suicidal ideation. He is inattentive.    Review of Systems  Constitutional: Negative.   HENT: Negative.   Eyes: Negative.   Respiratory: Negative.   Cardiovascular: Negative.   Gastrointestinal: Negative.   Musculoskeletal: Negative.   Skin: Negative.   Neurological: Negative.   Psychiatric/Behavioral: Positive for memory loss. Negative for depression, hallucinations, substance abuse and suicidal ideas. The patient is nervous/anxious and has insomnia.     Blood pressure 119/61, pulse 72, temperature 98.9 F (37.2 C), temperature source Oral, resp. rate 20, height 6' (1.829 m), weight 136.1 kg (300 lb), SpO2 98 %.Body mass index is 40.69 kg/m.  General Appearance: Disheveled  Eye Contact:  Fair  Speech:  Pressured  Volume:  Increased  Mood:  Euphoric  Affect:  Inappropriate, Labile and Full Range  Thought Process:  Irrelevant  Orientation:  Full (Time, Place, and Person)  Thought Content:  Illogical  Suicidal Thoughts:  No  Homicidal Thoughts:  No  Memory:  Immediate;   Fair Recent;   Poor Remote;   Fair  Judgement:  Fair  Insight:  Good and Fair   Psychomotor Activity:  Increased and Restlessness  Concentration:  Concentration: Poor  Recall:  [place] of Knowledge:  Good  Language:  Good  Akathisia:  No  Handed:  Right  AIMS (if indicated):     Assets:  Agricultural consultant Housing Resilience Social Support  ADL's:  Intact  Cognition:  WNL  Sleep:  Number of Hours: 2.3     Treatment Plan Summary: Daily contact with patient to assess and evaluate symptoms and progress in treatment, Medication management and Plan No changes ordered to medicine. Supportive counseling and review of treatment plan including ECT. We are planning for ECT again tomorrow morning and he is at this point tentatively agreeable. Reported counseling and review his progress with him.  Alethia Berthold, MD 12/28/2015, 7:22 PM

## 2015-12-28 NOTE — B[facility] Group Notes (Signed)
[facility] LCSW Group Therapy   12/28/2015 9:30 am   Type of Therapy: Group Therapy   Participation Level: Active   Participation Quality: Attentive, Sharing and Supportive   Affect: Appropriate   Cognitive: Alert and Oriented   Insight: Developing/Improving and Engaged   Engagement in Therapy: Developing/Improving and Engaged   Modes of Intervention: Clarification, Confrontation, Discussion, Education, Exploration, Limit-setting, Orientation, Problem-solving, Rapport Building, Art therapist, Socialization and Support   Summary of Progress/Problems: The topic for group was balance in life. Today's group focused on defining balance in one's own words, identifying things that can knock one off balance, and exploring healthy ways to maintain balance in life. Group members were asked to provide an example of a time when they felt off balance, describe how they handled that situation, and process healthier ways to regain balance in the future. Group members were asked to share the most important tool for maintaining balance that they learned while at [facility] and how they plan to apply this method after discharge. Pt did share that for the pt achieving balance meant "keeping his eyes open". Pt was not clear on the meaning of this and others thoughts the pt shared with the group.  Pt does present as more pleasant and calm and less manic, but the pt's speech and thoughts were not connected. Pt was polite and cooperative with the CSW and other group members, however and focused and attentive to the topics discussed and the sharing of others.  Pt was supportive of the sharing of others.

## 2015-12-28 NOTE — [facility] Group Notes (Signed)
[facility] Group Notes:  (Nursing/MHT/Case Management/Adjunct)  Date:  12/28/2015  Time:  3:01 AM  Type of Therapy:  Group Therapy  Participation Level:  Active  Participation Quality:  Attentive and Redirectable  Affect:  Excited  Cognitive:  Disorganized  Insight:  Lacking  Engagement in Group:  Lacking  Modes of Intervention:  Discussion  Summary of Progress/Problems: Pt interacted properly with fellow pt's. However when staff asked pt direct questions about his goal. Pt's thoughts were very disorganized.   Jenetta Downer Deveney Bayon 12/28/2015, 3:01 AM

## 2015-12-28 NOTE — Progress Notes (Signed)
ANTICOAGULATION CONSULT NOTE - Follow Up Consult  Pharmacy Consult for LMWH/VKA Indication: VTE treatment  Allergies  Allergen Reactions  . Asa [Aspirin] Other (See Comments)    Patient tolerates LOW DOSE ASPIRIN.    Patient Measurements: Height: 6' (182.9 cm) Weight: 300 lb (136.1 kg) IBW/kg (Calculated) : 77.6  Vital Signs: Temp: 98.9 F (37.2 C) (07/27 0712) Temp Source: Oral (07/27 0712) BP: 119/61 (07/27 0712) Pulse Rate: 72 (07/27 0712)  Labs:  Recent Labs  12/26/15 0631 12/27/15 0839 12/28/15 0626  LABPROT 32.3* 32.5* 33.8*  INR 3.22 3.08 3.24   Estimated Creatinine Clearance: 78 mL/min (by C-G formula based on SCr of 1.6 mg/dL).  Medical History: Past Medical History:  Diagnosis Date  . Bipolar 1 disorder ([place])   . CKD (chronic kidney disease), stage III   . Diabetes mellitus without complication ([place])   . DVT (deep venous thrombosis) ([place])   . Hypercholesteremia   . Hypertension    Medications:  Warfarin 10 mg po daily  Assessment: 37 yom admitted to ED [facility] with manic symptoms. Recently discharged from [facility] with DVT/PE, had been bridging LMWH and VKA prior to admission.  INR History: 7/9: INR - 1.99 7/10: no INR- warfarin 12.5 mg po daily at 0200 7/11: INR - 2.97- warfarin held 7/12: INR - 1.92 - warfarin 10 mg 7/13: INR - 1.95 - warfarin 10 mg 7/14: INR - 2.56 - warfarin 5 mg 7/15: INR - 2.73 - warfarin 5 mg 7/16: INR - 2.39 - warfarin 7.5 mg 7/17: INR - 2.22 - warfarin 7.5 mg 7/18: INR - 2.53 - warfarin 7.5 mg 7/19: INR - 2.75 - warfarin 7.5 mg 7/20: INR - 2.99 - warfarin 6.5 mg 7/21: INR - 2.64 - warfarin 6.5 mg  7/22: INR: 2.44; warfarin 8  7/23: INR: 2.60;  Warfarin 10mg  7/24: INR 2.44 - warfarin 10mg  7/25:  INR 3.22 - warfarin 10mg  7/26:  INR 3.08 - warfarin 8mg  7/27:  INR 3.24 -  Goal of Therapy:  INR 2-3 Monitor platelets by anticoagulation protocol: Yes   Plan:  INR slightly elevated. Just reduced dose yesterday to 8mg .   Will see if INR comes back down tomorrow after 2 doses.  Patient with orders for carbamazepine which interacts with warfarin to decrease the INR.  Will follow INR closely.   Pharmacy will continue to follow.   Nishita Isaacks K, RPh 12/28/2015,8:50 AM

## 2015-12-28 NOTE — B[facility] Group Notes (Signed)
Goals Group Date/Time: 12/28/2015 9:00 AM Type of Therapy and Topic: Group Therapy: Goals Group: SMART Goals   Participation Level: Moderate  Description of Group:    The purpose of a daily goals group is to assist and guide patients in setting recovery/wellness-related goals. The objective is to set goals as they relate to the crisis in which they were admitted. Patients will be using SMART goal modalities to set measurable goals. Characteristics of realistic goals will be discussed and patients will be assisted in setting and processing how one will reach their goal. Facilitator will also assist patients in applying interventions and coping skills learned in psycho-education groups to the SMART goal and process how one will achieve defined goal.   Therapeutic Goals:   -Patients will develop and document one goal related to or their crisis in which brought them into treatment.  -Patients will be guided by LCSW using SMART goal setting modality in how to set a measurable, attainable, realistic and time sensitive goal.  -Patients will process barriers in reaching goal.  -Patients will process interventions in how to overcome and successful in reaching goal.   Patient's Goal: Pt shared the pt's goal is to focus on seeing his psychiatrist.  Pt shared the pt's primary motivation for achieving the pt's goal is to then begin focusing on other goals. Pt did present as manic but has during group, as evidenced by not dominating group sharing, sharing at length when only when prompted and pt presents as pleasant and calm, as compared to previous sessions. Pt was polite and cooperative with the CSW and other group members and focused and attentive to the topics discussed and the sharing of others.     Therapeutic Modalities:  Motivational Interviewing  Art gallery manager  SMART goals setting   [name]. Gilda Abboud, LCSWA, LCAS

## 2015-12-28 NOTE — Plan of Care (Signed)
Problem: Coping: Goal: Ability to interact with others will improve Outcome: Not Progressing Continues to be hyperverbal and disorganized.

## 2015-12-28 NOTE — Progress Notes (Signed)
Patient is continues to be hyperverbal and disorganized.Today patient is preoccupied with phone numbers.Stated that all his family's numbers are hooked up with the hospital number.Compliant with medications.Appetite & energy level good.

## 2015-12-29 ENCOUNTER — Inpatient Hospital Stay: Payer: 59 | Admitting: Anesthesiology

## 2015-12-29 LAB — PROTIME-INR
INR: 2.79
PROTHROMBIN TIME: 30 s — AB (ref 11.4–15.2)

## 2015-12-29 LAB — CBC
HCT: 34.9 % — ABNORMAL LOW (ref 40.0–52.0)
HEMOGLOBIN: 12.1 g/dL — AB (ref 13.0–18.0)
MCH: 29.6 pg (ref 26.0–34.0)
MCHC: 34.7 g/dL (ref 32.0–36.0)
MCV: 85.3 fL (ref 80.0–100.0)
Platelets: 255 10*3/uL (ref 150–440)
RBC: 4.1 MIL/uL — ABNORMAL LOW (ref 4.40–5.90)
RDW: 12.6 % (ref 11.5–14.5)
WBC: 3.9 10*3/uL (ref 3.8–10.6)

## 2015-12-29 LAB — GLUCOSE, CAPILLARY
GLUCOSE-CAPILLARY: 147 mg/dL — AB (ref 65–99)
Glucose-Capillary: 108 mg/dL — ABNORMAL HIGH (ref 65–99)
Glucose-Capillary: 104 mg/dL — ABNORMAL HIGH (ref 65–99)

## 2015-12-29 MED ORDER — MIDAZOLAM HCL 2 MG/2ML IJ SOLN: 4.0000 mg | Freq: Once | INTRAMUSCULAR | Status: DC

## 2015-12-29 MED ORDER — KETOROLAC TROMETHAMINE 30 MG/ML IJ SOLN
30.0000 mg | Freq: Once | INTRAMUSCULAR | Status: AC
  Administered 2015-12-29: 30 mg via INTRAVENOUS

## 2015-12-29 MED ORDER — DEXTROSE 5 % IV SOLN: 250.0000 mL | Freq: Once | INTRAVENOUS | Status: DC

## 2015-12-29 MED ORDER — SUCCINYLCHOLINE CHLORIDE 200 MG/10ML IV SOSY
PREFILLED_SYRINGE | INTRAVENOUS | Status: DC | PRN
  Administered 2015-12-29: 150 mg via INTRAVENOUS

## 2015-12-29 MED ORDER — KETOROLAC TROMETHAMINE 30 MG/ML IJ SOLN
INTRAMUSCULAR | Status: AC
  Filled 2015-12-29: qty 1

## 2015-12-29 MED ORDER — AMMONIUM LACTATE 12 % EX LOTN
TOPICAL_LOTION | Freq: Every morning | CUTANEOUS | Status: DC
  Administered 2015-12-29 – 2016-01-01 (×4): via TOPICAL
  Filled 2015-12-29 (×2): qty 400

## 2015-12-29 MED ORDER — METHOHEXITAL SODIUM 100 MG/10ML IV SOSY
PREFILLED_SYRINGE | INTRAVENOUS | Status: DC | PRN
Start: 2015-12-29 — End: 2015-12-29
  Administered 2015-12-29: 100 mg via INTRAVENOUS

## 2015-12-29 MED ORDER — KETOROLAC TROMETHAMINE 30 MG/ML IJ SOLN: 30.0000 mg | Freq: Once | INTRAMUSCULAR | Status: DC

## 2015-12-29 NOTE — Procedures (Signed)
ECT SERVICES Physician's Interval Evaluation & Treatment Note  Patient Identification: Cameron [name] MRN:  [id number] Date of Evaluation:  12/29/2015 TX #: 3  MADRS:   MMSE:   P.E. Findings:  Legs are wrapped but still looks sore. Lungs and heart normal.  Psychiatric Interval Note:  Significantly better calm or still tangential but much more lucid.  Subjective:  Patient is a 50 y.o. male seen for evaluation for Electroconvulsive Therapy. No specific complaint  Treatment Summary:   []   Right Unilateral             [x]  Bilateral   % Energy : 1.0 ms, 100%   Impedance: 600 ohms  Seizure Energy Index: 1345 V squared  Postictal Suppression Index:  12%  Seizure Concordance Index: 97%  Medications  Pre Shock: Brevital 100 mg, succinylcholine 150 mg  Post Shock: None  Seizure Duration: 5 seconds by EMG, 72 seconds by EEG   Comments: Tolerated treatment well continue Monday Tuesday Wednesday looking for a total of 8 treatments.   Lungs:  [x]   Clear to auscultation               []  Other:   Heart:    [x]   Regular rhythm             []  irregular rhythm    [x]   Previous H&P reviewed, patient examined and there are NO CHANGES                 []   Previous H&P reviewed, patient examined and there are changes noted.   Alethia Berthold, MD 7/28/201710:19 AM

## 2015-12-29 NOTE — Anesthesia Postprocedure Evaluation (Signed)
Anesthesia Post Note  Patient: Cameron [name]  Procedure(s) Performed: * No procedures listed *  Patient location during evaluation: PACU Anesthesia Type: General Level of consciousness: awake and alert Pain management: pain level controlled Vital Signs Assessment: post-procedure vital signs reviewed and stable Respiratory status: spontaneous breathing, nonlabored ventilation, respiratory function stable and patient connected to nasal cannula oxygen Cardiovascular status: blood pressure returned to baseline and stable Postop Assessment: no signs of nausea or vomiting Anesthetic complications: no    Last Vitals:  Vitals:   12/29/15 1130 12/29/15 1140  BP: (!) 113/56   Pulse: 91   Resp: 18   Temp:  36.9 C    Last Pain:  Vitals:   12/29/15 1140  TempSrc:   PainSc: 0-No pain                 [name]

## 2015-12-29 NOTE — Anesthesia Procedure Notes (Signed)
Date/Time: 12/29/2015 10:57 AM Performed by: Dionne Bucy Pre-anesthesia Checklist: Patient identified, Emergency Drugs available, Suction available and Patient being monitored Patient Re-evaluated:Patient Re-evaluated prior to inductionOxygen Delivery Method: Circle system utilized Preoxygenation: Pre-oxygenation with 100% oxygen Intubation Type: IV induction Ventilation: Mask ventilation without difficulty and Mask ventilation throughout procedure Airway Equipment and Method: Bite block Placement Confirmation: positive ETCO2 Dental Injury: Teeth and Oropharynx as per pre-operative assessment

## 2015-12-29 NOTE — Progress Notes (Addendum)
Chaplain rounded the unit and visited the patient prior to a medical procedure at the request of the patient. Chaplain provided a compassionate presence and support to the patient which appeared to lower his anxiety. Patient and nurse further requested Chaplain's attendance Friday December 29, 2015 at 9:00a.m. Minerva Fester [phone]

## 2015-12-29 NOTE — Progress Notes (Addendum)
Patient cooperative with NPO status and ECT tx this am. Patient increasingly cooperative with recommended plan of care. Blood pressure medicines held this am rt orthostatic blood pressure. Writer spoke with ECT nurse. Wife requests reevaluation by wound care nurse. Writer initiates consult. Safety maintained. Wound care nurse phones writer and considers recommendations. Reports bites marks on legs were from bed bugs at patient's home. Recommends Lac-hydrin QD.

## 2015-12-29 NOTE — Plan of Care (Signed)
Problem: Safety: Goal: Ability to redirect hostility and anger into socially appropriate behaviors will improve Outcome: Progressing Pt not exhibiting any anger or hostility.

## 2015-12-29 NOTE — Transfer of Care (Signed)
Immediate Anesthesia Transfer of Care Note  Patient: Cameron [name]  Procedure(s) Performed: PACU  Patient Location: PACU  Anesthesia Type:General  Level of Consciousness: awake and patient cooperative  Airway & Oxygen Therapy: Patient Spontanous Breathing and Patient connected to face mask oxygen  Post-op Assessment: Report given to RN  Post vital signs: Reviewed and stable  Last Vitals:  Vitals:   12/29/15 0848 12/29/15 1110  BP: 118/66 115/64  Pulse: 92 96  Resp:  19  Temp: 36.9 C 37.1 C    Last Pain:  Vitals:   12/29/15 1110  TempSrc:   PainSc: 0-No pain      Patients Stated Pain Goal: 0 ([id number])  Complications: No apparent anesthesia complications

## 2015-12-29 NOTE — Progress Notes (Signed)
D: Observed pt in the dayroom interacting with peers. Patient alert and oriented x4. Patient denies SI/HI/AVH. Pt affect is inappropriate with circumstance. Pt appears much more calm this night. Pt speech is not loud, pt not as intrusive, pt speech less pressured. Pt content can be disorganized and circumstantial, but pt calm enough to listen to and wait for responses during conversations. Pt still appears to have some religious preoccupation, but it did not dominate conversation. Pt stated his mood is "extremely serious, but still having a good time...mello as cello." Pt indicated he wanted to go home tomorrow. A: Offered active listening and support. Provided therapeutic communication. Administered scheduled medications. Encouraged and supported pt's improvements in behavior and mood. Advised pt to maintain NPO status after midnight. Encouraged pt to follow through with ECT tomorrow. R: Pt pleasant and cooperative. Pt interacting well with peers. Pt agreed to undergo ECT tomorrow. Pt medication compliant. Will continue Q15 min. checks. Safety maintained.

## 2015-12-29 NOTE — Plan of Care (Signed)
Problem: Education: Goal: Emotional status will improve Mood slightly less maniac, decreased hyper verbal activity noted.

## 2015-12-29 NOTE — Consult Note (Addendum)
WOC Nurse wound follow up Wound type: Dermatitis from bed bugs, resolving.  Pt refuses to keep unna boots on.  Will replace with topical therapy. Dressing procedure/placement/frequency: Cleanse bilateral legs with soap and water. Pat dry. Apply amlactin cream to bilateral legs, silicone bordered foam dressing to non intact skin. Change every three days.   Dorna Bloom RN, BSN, WOCN

## 2015-12-29 NOTE — Progress Notes (Addendum)
ANTICOAGULATION CONSULT NOTE - Follow Up Consult  Pharmacy Consult for Warfarin Indication: VTE treatment  Allergies  Allergen Reactions  . Asa [Aspirin] Other (See Comments)    Patient tolerates LOW DOSE ASPIRIN.    Patient Measurements: Height: 6' (182.9 cm) Weight: 300 lb (136.1 kg) IBW/kg (Calculated) : 77.6  Vital Signs: Temp: 98.5 F (36.9 C) (07/28 0700) BP: 101/59 (07/28 0700) Pulse Rate: 103 (07/28 0700)  Labs:  Recent Labs  12/27/15 0839 12/28/15 0626 12/29/15 0650  LABPROT 32.5* 33.8* 30.0*  INR 3.08 3.24 2.79   Estimated Creatinine Clearance: 78 mL/min (by C-G formula based on SCr of 1.6 mg/dL).  Medical History: Past Medical History:  Diagnosis Date  . Bipolar 1 disorder ([place])   . CKD (chronic kidney disease), stage III   . Diabetes mellitus without complication ([place])   . DVT (deep venous thrombosis) ([place])   . Hypercholesteremia   . Hypertension    Medications:  Warfarin 10 mg po daily  Assessment: 55 yom admitted to ED [facility] with manic symptoms. Recently discharged from [facility] with DVT/PE, had been bridging LMWH and VKA prior to admission.  INR History: 7/9: INR - 1.99 7/10: no INR- warfarin 12.5 mg po daily at 0200 7/11: INR - 2.97- warfarin held 7/12: INR - 1.92 - warfarin 10 mg 7/13: INR - 1.95 - warfarin 10 mg 7/14: INR - 2.56 - warfarin 5 mg 7/15: INR - 2.73 - warfarin 5 mg 7/16: INR - 2.39 - warfarin 7.5 mg 7/17: INR - 2.22 - warfarin 7.5 mg 7/18: INR - 2.53 - warfarin 7.5 mg 7/19: INR - 2.75 - warfarin 7.5 mg 7/20: INR - 2.99 - warfarin 6.5 mg 7/21: INR - 2.64 - warfarin 6.5 mg  7/22: INR: 2.44; warfarin 8  7/23: INR: 2.60;  Warfarin 10mg  7/24: INR 2.44 - warfarin 10mg  7/25:  INR 3.22 - warfarin 10mg  7/26:  INR 3.08 - warfarin 8mg  7/27:  INR 3.24 - warfarin 8 mg 7/28:  INR 2.79 -   Goal of Therapy:  INR 2-3 Monitor platelets by anticoagulation protocol: Yes   Plan:  INR within goal range. Will repeat warfarin 8mg  PO today.   Follow up INR in AM.  Last CBC 7 days ago, will order add on CBC for today to trend Hgb.   Patient with orders for carbamazepine which interacts with warfarin to decrease the INR.  Will follow INR closely.   Pt will need CBC q3 days per policy.   Pharmacy will continue to follow.   Rocky Morel, RPh 12/29/2015,8:38 AM   Addendum: CBC today relatively stable from a week ago. Hgb 12.1, Plt 255  Rayna Sexton, PharmD, BCPS Clinical Pharmacist 12/29/2015 9:04 AM

## 2015-12-29 NOTE — H&P (Signed)
Cameron [name] is an 50 y.o. male.   Chief Complaint: Patient is feeling better. No specific complaint HPI: Mania as part of bipolar disorder gradually improving  Past Medical History:  Diagnosis Date  . Bipolar 1 disorder ([place])   . CKD (chronic kidney disease), stage III   . Diabetes mellitus without complication ([place])   . DVT (deep venous thrombosis) ([place])   . Hypercholesteremia   . Hypertension     Past Surgical History:  Procedure Laterality Date  . APPENDECTOMY      Family History  Problem Relation Age of Onset  . Stroke Other   . Diabetes Other    Social History:  reports that he has never smoked. He does not have any smokeless tobacco history on file. He reports that he drinks alcohol. He reports that he does not use drugs.  Allergies:  Allergies  Allergen Reactions  . Asa [Aspirin] Other (See Comments)    Patient tolerates LOW DOSE ASPIRIN.    Medications Prior to Admission  Medication Sig Dispense Refill  . ARIPiprazole (ABILIFY) 30 MG tablet Take 30 mg by mouth every evening.     Marland Kitchen atorvastatin (LIPITOR) 40 MG tablet Take 40 mg by mouth daily.  11  . Canagliflozin (INVOKANA) 100 MG TABS Take 100 mg by mouth daily with breakfast.     . carbamazepine (TEGRETOL) 200 MG tablet Take 400-600 mg by mouth 2 (two) times daily. Take 2 tablets in the morning Take 3 tablets in the evening    . enalapril (VASOTEC) 5 MG tablet Take 5 mg by mouth 2 (two) times daily.    Marland Kitchen enoxaparin (LOVENOX) 150 MG/ML injection Inject 1 mL (150 mg total) into the skin every 12 (twelve) hours. 8 Syringe 0  . fenofibrate 160 MG tablet Take 160 mg by mouth daily.    . Insulin Glargine (LANTUS SOLOSTAR) 100 UNIT/ML Solostar Pen Inject 60 Units into the skin daily.     Marland Kitchen lamoTRIgine (LAMICTAL) 100 MG tablet Take 400 mg by mouth at bedtime.    . Multiple Vitamin (MULTIVITAMIN WITH MINERALS) TABS tablet Take 1 tablet by mouth daily.    . QUEtiapine (SEROQUEL) 400 MG tablet Take 800 mg by mouth at  bedtime.    Marland Kitchen TANZEUM 50 MG PEN Inject 50 mg into the skin every 7 (seven) days.  2  . temazepam (RESTORIL) 15 MG capsule Take 15 mg by mouth at bedtime as needed for sleep.    Marland Kitchen warfarin (COUMADIN) 5 MG tablet Take 2 tablets (10 mg total) by mouth daily. 60 tablet 0    Results for orders placed or performed during the hospital encounter of 12/11/15 (from the past 48 hour(s))  Glucose, capillary     Status: None   Collection Time: 12/27/15  1:12 PM  Result Value Ref Range   Glucose-Capillary 83 65 - 99 mg/dL  Glucose, capillary     Status: Abnormal   Collection Time: 12/27/15  4:39 PM  Result Value Ref Range   Glucose-Capillary 114 (H) 65 - 99 mg/dL  Glucose, capillary     Status: Abnormal   Collection Time: 12/27/15  8:04 PM  Result Value Ref Range   Glucose-Capillary 103 (H) 65 - 99 mg/dL  Protime-INR     Status: Abnormal   Collection Time: 12/28/15  6:26 AM  Result Value Ref Range   Prothrombin Time 33.8 (H) 11.4 - 15.2 seconds   INR 3.24   Glucose, capillary     Status: Abnormal  Collection Time: 12/28/15  6:40 AM  Result Value Ref Range   Glucose-Capillary 135 (H) 65 - 99 mg/dL  Glucose, capillary     Status: Abnormal   Collection Time: 12/28/15 11:30 AM  Result Value Ref Range   Glucose-Capillary 154 (H) 65 - 99 mg/dL  Glucose, capillary     Status: Abnormal   Collection Time: 12/28/15  4:29 PM  Result Value Ref Range   Glucose-Capillary 132 (H) 65 - 99 mg/dL  Glucose, capillary     Status: Abnormal   Collection Time: 12/28/15  8:44 PM  Result Value Ref Range   Glucose-Capillary 116 (H) 65 - 99 mg/dL  Glucose, capillary     Status: Abnormal   Collection Time: 12/29/15  6:46 AM  Result Value Ref Range   Glucose-Capillary 147 (H) 65 - 99 mg/dL  Protime-INR     Status: Abnormal   Collection Time: 12/29/15  6:50 AM  Result Value Ref Range   Prothrombin Time 30.0 (H) 11.4 - 15.2 seconds   INR 2.79   CBC     Status: Abnormal   Collection Time: 12/29/15  6:50 AM   Result Value Ref Range   WBC 3.9 3.8 - 10.6 K/uL   RBC 4.10 (L) 4.40 - 5.90 MIL/uL   Hemoglobin 12.1 (L) 13.0 - 18.0 g/dL   HCT 34.9 (L) 40.0 - 52.0 %   MCV 85.3 80.0 - 100.0 fL   MCH 29.6 26.0 - 34.0 pg   MCHC 34.7 32.0 - 36.0 g/dL   RDW 12.6 11.5 - 14.5 %   Platelets 255 150 - 440 K/uL   No results found.  Review of Systems  Constitutional: Negative.   HENT: Negative.   Eyes: Negative.   Respiratory: Negative.   Cardiovascular: Negative.   Gastrointestinal: Negative.   Musculoskeletal: Negative.   Skin: Negative.   Neurological: Negative.   Psychiatric/Behavioral: Positive for memory loss. Negative for depression, hallucinations, substance abuse and suicidal ideas. The patient has insomnia. The patient is not nervous/anxious.     Blood pressure 118/66, pulse 92, temperature 98.4 F (36.9 C), temperature source Oral, resp. rate 20, height 6' (1.829 m), weight 136.1 kg (300 lb), SpO2 98 %. Physical Exam  Nursing note and vitals reviewed. Constitutional: He appears well-developed and well-nourished.  HENT:  Head: Normocephalic and atraumatic.  Eyes: Conjunctivae are normal. Pupils are equal, round, and reactive to light.  Neck: Normal range of motion.  Cardiovascular: Regular rhythm and normal heart sounds.   Respiratory: Effort normal and breath sounds normal. No respiratory distress.  GI: Soft.  Musculoskeletal: Normal range of motion.  Neurological: He is alert.  Skin: Skin is warm and dry.  Psychiatric: His behavior is normal. His affect is labile. His speech is rapid and/or pressured. Thought content is delusional. He expresses impulsivity. He exhibits abnormal recent memory.     Assessment/Plan Treatment today plan follow-up Monday monitoring day by day for improvement  Alethia Berthold, MD 12/29/2015, 10:17 AM

## 2015-12-29 NOTE — Anesthesia Preprocedure Evaluation (Signed)
Anesthesia Evaluation  Patient identified by MRN, date of birth, ID band Patient awake    Reviewed: Allergy & Precautions, H&P , NPO status , Patient's Chart, lab work & pertinent test results, reviewed documented beta blocker date and time   Airway Mallampati: II   Neck ROM: full    Dental  (+) Poor Dentition   Pulmonary neg pulmonary ROS,    Pulmonary exam normal        Cardiovascular hypertension, + Peripheral Vascular Disease  negative cardio ROS Normal cardiovascular exam Rate:Normal     Neuro/Psych negative neurological ROS  negative psych ROS   GI/Hepatic negative GI ROS, Neg liver ROS,   Endo/Other  negative endocrine ROSdiabetes  Renal/GU Renal diseasenegative Renal ROS  negative genitourinary   Musculoskeletal   Abdominal   Peds  Hematology negative hematology ROS (+)   Anesthesia Other Findings Past Medical History: No date: Bipolar 1 disorder (HCC) No date: CKD (chronic kidney disease), stage III No date: Diabetes mellitus without complication (HCC) No date: DVT (deep venous thrombosis) (HCC) No date: Hypercholesteremia No date: Hypertension Past Surgical History: No date: APPENDECTOMY BMI    Body Mass Index:  40.69 kg/m     Reproductive/Obstetrics                             Anesthesia Physical Anesthesia Plan  ASA: III  Anesthesia Plan: General   Post-op Pain Management:    Induction:   Airway Management Planned:   Additional Equipment:   Intra-op Plan:   Post-operative Plan:   Informed Consent: I have reviewed the patients History and Physical, chart, labs and discussed the procedure including the risks, benefits and alternatives for the proposed anesthesia with the patient or authorized representative who has indicated his/her understanding and acceptance.   Dental Advisory Given  Plan Discussed with: CRNA  Anesthesia Plan Comments:          Anesthesia Quick Evaluation

## 2015-12-29 NOTE — [facility] Group Notes (Signed)
[facility] LCSW Group Therapy   12/29/2015  9:30am  Type of Therapy: Group Therapy   Participation Level: Did Not Attend. Patient invited to participate but declined.    [name]. Judeen Geralds, MSW, LCSWA, LCAS

## 2015-12-29 NOTE — Progress Notes (Signed)
[facility] MD Progress Note  12/29/2015 6:42 PM Cameron [name]  MRN:  [id number] Subjective:  This is a 50 year old man with bipolar disorder currently in a manic phase. 2 weeks into his hospitalization. In addition to manic bipolar disorder also has a history of a pulmonary embolism related to deep vein thromboses, diabetes, cellulitis, elevated cholesterol  Follow-up 51 year old man with mania. Third ECT treatment today. Tolerated well. He seems much better to me. He is able to talk with me fairly lucidly. Even though he still has flight of ideas he does not seem to be as psychotic. He is much more pleasant and easy going and showing some improvement in insight. Had a visit today from Dr. Thurmond Butts which I think was very helpful in supporting the treatment   Principal Problem: Bipolar I disorder, current or most recent episode manic, with psychotic features [facility]) Diagnosis:   Patient Active Problem List   Diagnosis Date Noted  . Bipolar I disorder, current or most recent episode manic, with psychotic features ([place]) [F31.2] 12/11/2015  . Pulmonary embolism ([place]) [I26.99] 12/04/2015  . Diabetes mellitus without complication ([place]) [id number]   . Hypercholesteremia [E78.00]   . CKD (chronic kidney disease), stage III [N18.3]   . Acute deep vein thrombosis (DVT) of femoral vein of right lower extremity (HCC) [I82.411]    Total Time spent with patient: 30 minutes  Past Psychiatric History: Long-standing history of bipolar disorder with multiple episodes of mania. Also has had depression. Very sensitive to changes in his medicine.  Past Medical History:  Past Medical History:  Diagnosis Date  . Bipolar 1 disorder ([place])   . CKD (chronic kidney disease), stage III   . Diabetes mellitus without complication ([place])   . DVT (deep venous thrombosis) ([place])   . Hypercholesteremia   . Hypertension     Past Surgical History:  Procedure Laterality Date  . APPENDECTOMY     Family History:  Family History    Problem Relation Age of Onset  . Stroke Other   . Diabetes Other    Family Psychiatric  History: Positive for bipolar disorder in at least 2 first-degree relatives Social History:  History  Alcohol Use  . Yes    Comment: rarely     History  Drug Use No    Social History   Social History  . Marital status: Married    Spouse name: N/A  . Number of children: N/A  . Years of education: N/A   Social History Main Topics  . Smoking status: Never Smoker  . Smokeless tobacco: None  . Alcohol use Yes     Comment: rarely  . Drug use: No  . Sexual activity: Not Asked   Other Topics Concern  . None   Social History Narrative  . None   Additional Social History:                         Sleep: Poor  Appetite:  Good  Current Medications: Current Facility-Administered Medications  Medication Dose Route Frequency Provider Last Rate Last Dose  . acetaminophen (TYLENOL) tablet 650 mg  650 mg Oral Q6H PRN Hildred Priest, MD   650 mg at 12/27/15 2220  . alum & mag hydroxide-simeth (MAALOX/MYLANTA) 200-200-20 MG/5ML suspension 30 mL  30 mL Oral Q4H PRN Hildred Priest, MD      . ammonium lactate (LAC-HYDRIN) 12 % lotion   Topical q morning - 10a Gonzella Lex, MD      .  ARIPiprazole (ABILIFY) tablet 30 mg  30 mg Oral QPM Hildred Priest, MD   30 mg at 12/29/15 1657  . atorvastatin (LIPITOR) tablet 40 mg  40 mg Oral Daily Hildred Priest, MD   40 mg at 12/29/15 1658  . canagliflozin (INVOKANA) tablet 100 mg  100 mg Oral BH-q7a Clovis Fredrickson, MD   100 mg at 12/29/15 1658  . carbamazepine (TEGRETOL XR) 12 hr tablet 600 mg  600 mg Oral QHS Jolanta B Pucilowska, MD   600 mg at 12/28/15 2206  . carbamazepine (TEGRETOL) tablet 400 mg  400 mg Oral Q breakfast Hildred Priest, MD   Stopped at 12/29/15 0825  . dextrose 5 % solution 250 mL  250 mL Intravenous Once Gonzella Lex, MD      . dextrose 5 % solution 250 mL  250 mL  Intravenous Once Gonzella Lex, MD      . enalapril (VASOTEC) tablet 5 mg  5 mg Oral BID Hildred Priest, MD   5 mg at 12/28/15 2205  . fenofibrate tablet 160 mg  160 mg Oral Daily Hildred Priest, MD   160 mg at 12/29/15 1658  . haloperidol (HALDOL) tablet 20 mg  20 mg Oral Q6H PRN Clovis Fredrickson, MD   20 mg at 12/22/15 0249  . insulin aspart (novoLOG) injection 0-5 Units  0-5 Units Subcutaneous QHS Hildred Priest, MD      . insulin aspart (novoLOG) injection 0-9 Units  0-9 Units Subcutaneous TID WC Hildred Priest, MD   Stopped at 12/29/15 1653  . insulin glargine (LANTUS) injection 45 Units  45 Units Subcutaneous Daily Clovis Fredrickson, MD   45 Units at 12/29/15 1758  . ketorolac (TORADOL) 30 MG/ML injection 30 mg  30 mg Intravenous Once Gonzella Lex, MD      . ketorolac (TORADOL) 30 MG/ML injection           . lamoTRIgine (LAMICTAL) tablet 400 mg  400 mg Oral QHS Hildred Priest, MD   400 mg at 12/28/15 2206  . LORazepam (ATIVAN) tablet 2 mg  2 mg Oral Q2H PRN Gonzella Lex, MD   2 mg at 12/27/15 2346  . magnesium hydroxide (MILK OF MAGNESIA) suspension 30 mL  30 mL Oral Daily PRN Hildred Priest, MD      . midazolam (VERSED) injection 2 mg  2 mg Intravenous Once Gonzella Lex, MD      . midazolam (VERSED) injection 4 mg  4 mg Intravenous Once Gonzella Lex, MD      . QUEtiapine (SEROQUEL) tablet 800 mg  800 mg Oral QHS Hildred Priest, MD   800 mg at 12/28/15 2205  . temazepam (RESTORIL) capsule 30 mg  30 mg Oral QHS Clovis Fredrickson, MD   30 mg at 12/28/15 2205  . warfarin (COUMADIN) tablet 8 mg  8 mg Oral q1800 Gonzella Lex, MD   8 mg at 12/29/15 1701  . Warfarin - Pharmacist Dosing Inpatient   Does not apply [id number] Hildred Priest, MD        Lab Results:  Results for orders placed or performed during the hospital encounter of 12/11/15 (from the past 48 hour(s))  Glucose, capillary      Status: Abnormal   Collection Time: 12/27/15  8:04 PM  Result Value Ref Range   Glucose-Capillary 103 (H) 65 - 99 mg/dL  Protime-INR     Status: Abnormal   Collection Time: 12/28/15  6:26 AM  Result Value Ref  Range   Prothrombin Time 33.8 (H) 11.4 - 15.2 seconds   INR 3.24   Glucose, capillary     Status: Abnormal   Collection Time: 12/28/15  6:40 AM  Result Value Ref Range   Glucose-Capillary 135 (H) 65 - 99 mg/dL  Glucose, capillary     Status: Abnormal   Collection Time: 12/28/15 11:30 AM  Result Value Ref Range   Glucose-Capillary 154 (H) 65 - 99 mg/dL  Glucose, capillary     Status: Abnormal   Collection Time: 12/28/15  4:29 PM  Result Value Ref Range   Glucose-Capillary 132 (H) 65 - 99 mg/dL  Glucose, capillary     Status: Abnormal   Collection Time: 12/28/15  8:44 PM  Result Value Ref Range   Glucose-Capillary 116 (H) 65 - 99 mg/dL  Glucose, capillary     Status: Abnormal   Collection Time: 12/29/15  6:46 AM  Result Value Ref Range   Glucose-Capillary 147 (H) 65 - 99 mg/dL  Protime-INR     Status: Abnormal   Collection Time: 12/29/15  6:50 AM  Result Value Ref Range   Prothrombin Time 30.0 (H) 11.4 - 15.2 seconds   INR 2.79   CBC     Status: Abnormal   Collection Time: 12/29/15  6:50 AM  Result Value Ref Range   WBC 3.9 3.8 - 10.6 K/uL   RBC 4.10 (L) 4.40 - 5.90 MIL/uL   Hemoglobin 12.1 (L) 13.0 - 18.0 g/dL   HCT 34.9 (L) 40.0 - 52.0 %   MCV 85.3 80.0 - 100.0 fL   MCH 29.6 26.0 - 34.0 pg   MCHC 34.7 32.0 - 36.0 g/dL   RDW 12.6 11.5 - 14.5 %   Platelets 255 150 - 440 K/uL  Glucose, capillary     Status: Abnormal   Collection Time: 12/29/15  4:48 PM  Result Value Ref Range   Glucose-Capillary 108 (H) 65 - 99 mg/dL    Blood Alcohol level:  Lab Results  Component Value Date   ETH <5 [id number]    Metabolic Disorder Labs: Lab Results  Component Value Date   HGBA1C 8.3 (H) 12/04/2015   MPG 192 12/04/2015   MPG 318 (H) 08/24/2010   Lab Results    Component Value Date   PROLACTIN 4.4 12/11/2015   Lab Results  Component Value Date   CHOL 143 12/11/2015   TRIG 286 (H) 12/11/2015   HDL 32 (L) 12/11/2015   CHOLHDL 4.5 12/11/2015   VLDL 57 (H) 12/11/2015   LDLCALC 54 12/11/2015   LDLCALC 56 12/05/2015    Physical Findings: AIMS:  , ,  ,  ,    CIWA:    COWS:     Musculoskeletal: Strength & Muscle Tone: within normal limits Gait & Station: normal Patient leans: N/A  Psychiatric Specialty Exam: Physical Exam  Nursing note and vitals reviewed. Constitutional: He appears well-developed and well-nourished.  HENT:  Head: Normocephalic and atraumatic.  Eyes: Conjunctivae are normal. Pupils are equal, round, and reactive to light.  Neck: Normal range of motion.  Cardiovascular: Normal heart sounds.   Respiratory: Effort normal.  GI: Soft.  Musculoskeletal: Normal range of motion.  Neurological: He is alert.  Skin: Skin is warm and dry.     Psychiatric: His affect is labile and inappropriate. His speech is rapid and/or pressured and tangential. He is agitated and hyperactive. Thought content is delusional. Cognition and memory are impaired. He expresses impulsivity. He expresses no suicidal ideation. He is inattentive.  Review of Systems  Constitutional: Negative.   HENT: Negative.   Eyes: Negative.   Respiratory: Negative.   Cardiovascular: Negative.   Gastrointestinal: Negative.   Musculoskeletal: Negative.   Skin: Negative.   Neurological: Negative.   Psychiatric/Behavioral: Positive for memory loss. Negative for depression, hallucinations, substance abuse and suicidal ideas. The patient is nervous/anxious and has insomnia.     Blood pressure (!) 113/56, pulse 91, temperature 98.5 F (36.9 C), resp. rate 18, height 6' (1.829 m), weight 136.1 kg (300 lb), SpO2 98 %.Body mass index is 40.69 kg/m.  General Appearance: Disheveled  Eye Contact:  Fair  Speech:  Pressured  Volume:  Increased  Mood:  Euphoric   Affect:  Inappropriate, Labile and Full Range  Thought Process:  Irrelevant  Orientation:  Full (Time, Place, and Person)  Thought Content:  Illogical  Suicidal Thoughts:  No  Homicidal Thoughts:  No  Memory:  Immediate;   Fair Recent;   Poor Remote;   Fair  Judgement:  Fair  Insight:  Good and Fair  Psychomotor Activity:  Increased and Restlessness  Concentration:  Concentration: Poor  Recall:  [place] of Knowledge:  Good  Language:  Good  Akathisia:  No  Handed:  Right  AIMS (if indicated):     Assets:  Agricultural consultant Housing Resilience Social Support  ADL's:  Intact  Cognition:  WNL  Sleep:  Number of Hours: 6.75     Treatment Plan Summary: Daily contact with patient to assess and evaluate symptoms and progress in treatment, Medication management and Plan Again, no change to psychiatric medicine. Next ECTs treatment scheduled for Monday. Dr. Thurmond Butts has tried to convince him to agree to a total of 8 treatments and the patient is acting as though he is likely to agree to this. Continue his anticoagulation. We discussed wound management today. Apparently the wound nurses do not think they need to do anything further. Nursing will continue to watch out for his legs. Patient realizes he needs to keep them clean. Follow-up Monday.  Alethia Berthold, MD 12/29/2015, 6:42 PM

## 2015-12-30 LAB — GLUCOSE, CAPILLARY
GLUCOSE-CAPILLARY: 124 mg/dL — AB (ref 65–99)
Glucose-Capillary: 94 mg/dL (ref 65–99)
Glucose-Capillary: 147 mg/dL — ABNORMAL HIGH (ref 65–99)
Glucose-Capillary: 148 mg/dL — ABNORMAL HIGH (ref 65–99)

## 2015-12-30 LAB — PROTIME-INR
INR: 2.74
Prothrombin Time: 29.6 seconds — ABNORMAL HIGH (ref 11.4–15.2)

## 2015-12-30 MED ORDER — FAMOTIDINE 40 MG/5ML PO SUSR
20.0000 mg | Freq: Two times a day (BID) | ORAL | Status: DC
  Administered 2015-12-30 – 2016-01-01 (×5): 20 mg via ORAL
  Filled 2015-12-30 (×6): qty 2.5

## 2015-12-30 MED ORDER — ONDANSETRON HCL 4 MG PO TABS: 4.0000 mg | ORAL_TABLET | Freq: Three times a day (TID) | ORAL | Status: DC | PRN

## 2015-12-30 NOTE — [facility] Group Notes (Signed)
[place] Group Notes:  (Nursing/MHT/Case Management/Adjunct)  Date:  12/30/2015  Time:  9:17 PM  Type of Therapy:  Evening Wrap-up Group  Participation Level:  Did Not Attend  Participation Quality:  Did Not Attend  Affect:  N/A  Cognitive:  N/A  Insight:  None  Engagement in Group:  Did Not Attend  Modes of Intervention:  Activity  Summary of Progress/Problems:  Levonne Spiller 12/30/2015, 9:17 PM

## 2015-12-30 NOTE — Progress Notes (Signed)
[facility] MD Progress Note  12/30/2015 4:31 PM Cameron [name]  MRN:  [id number] Subjective:  This is a 50 year old man with bipolar disorder currently in a manic phase. 2 weeks into his hospitalization. In addition to manic bipolar disorder also has a history of a pulmonary embolism related to deep vein thromboses, diabetes, cellulitis, elevated cholesterol  Follow-up 50 year old man with mania. Third ECT treatment On 7/28. Tolerated well. He seems much better to me. He is able to talk with me fairly lucidly. Even though he still has flight of ideas he does not seem to be as psychotic. He is much more pleasant and easy going and showing some improvement in insight. Had a visit today from Dr. Thurmond Butts which I think was very helpful in supporting the treatment   Today talking with pushed speech. Thought process is disorganized.  He has been c/o nausea today and requested pepcid.  Compliant with medications.  Poor sleep , only 2 h.  No aggression or agitation  Principal Problem: Bipolar I disorder, current or most recent episode manic, with psychotic features ([place]) Diagnosis:   Patient Active Problem List   Diagnosis Date Noted  . Bipolar I disorder, current or most recent episode manic, with psychotic features ([place]) [F31.2] 12/11/2015  . Pulmonary embolism ([place]) [I26.99] 12/04/2015  . Diabetes mellitus without complication ([place]) [id number]   . Hypercholesteremia [E78.00]   . CKD (chronic kidney disease), stage III [N18.3]   . Acute deep vein thrombosis (DVT) of femoral vein of right lower extremity (HCC) [I82.411]    Total Time spent with patient: 30 minutes  Past Psychiatric History: Long-standing history of bipolar disorder with multiple episodes of mania. Also has had depression. Very sensitive to changes in his medicine.  Past Medical History:  Past Medical History:  Diagnosis Date  . Bipolar 1 disorder ([place])   . CKD (chronic kidney disease), stage III   . Diabetes mellitus without complication  ([place])   . DVT (deep venous thrombosis) ([place])   . Hypercholesteremia   . Hypertension     Past Surgical History:  Procedure Laterality Date  . APPENDECTOMY     Family History:  Family History  Problem Relation Age of Onset  . Stroke Other   . Diabetes Other    Family Psychiatric  History: Positive for bipolar disorder in at least 2 first-degree relatives Social History:  History  Alcohol Use  . Yes    Comment: rarely     History  Drug Use No    Social History   Social History  . Marital status: Married    Spouse name: N/A  . Number of children: N/A  . Years of education: N/A   Social History Main Topics  . Smoking status: Never Smoker  . Smokeless tobacco: None  . Alcohol use Yes     Comment: rarely  . Drug use: No  . Sexual activity: Not Asked   Other Topics Concern  . None   Social History Narrative  . None   Additional Social History:                         Sleep: Poor  Appetite:  Good  Current Medications: Current Facility-Administered Medications  Medication Dose Route Frequency Provider Last Rate Last Dose  . acetaminophen (TYLENOL) tablet 650 mg  650 mg Oral Q6H PRN Hildred Priest, MD   650 mg at 12/30/15 0110  . alum & mag hydroxide-simeth (MAALOX/MYLANTA) 200-200-20 MG/5ML suspension 30 mL  30 mL Oral Q4H PRN Hildred Priest, MD      . ammonium lactate (LAC-HYDRIN) 12 % lotion   Topical q morning - 10a Gonzella Lex, MD      . ARIPiprazole (ABILIFY) tablet 30 mg  30 mg Oral QPM Hildred Priest, MD   30 mg at 12/29/15 1657  . atorvastatin (LIPITOR) tablet 40 mg  40 mg Oral Daily Hildred Priest, MD   40 mg at 12/30/15 0902  . canagliflozin (INVOKANA) tablet 100 mg  100 mg Oral BH-q7a Clovis Fredrickson, MD   100 mg at 12/30/15 0655  . carbamazepine (TEGRETOL XR) 12 hr tablet 600 mg  600 mg Oral QHS Clovis Fredrickson, MD   600 mg at 12/29/15 2114  . carbamazepine (TEGRETOL) tablet 400 mg   400 mg Oral Q breakfast Hildred Priest, MD   400 mg at 12/30/15 0902  . dextrose 5 % solution 250 mL  250 mL Intravenous Once Gonzella Lex, MD      . dextrose 5 % solution 250 mL  250 mL Intravenous Once Gonzella Lex, MD      . enalapril (VASOTEC) tablet 5 mg  5 mg Oral BID Hildred Priest, MD   5 mg at 12/30/15 0902  . famotidine (PEPCID) 40 MG/5ML suspension 20 mg  20 mg Oral BID Hildred Priest, MD   20 mg at 12/30/15 1116  . fenofibrate tablet 160 mg  160 mg Oral Daily Hildred Priest, MD   160 mg at 12/30/15 0902  . haloperidol (HALDOL) tablet 20 mg  20 mg Oral Q6H PRN Clovis Fredrickson, MD   20 mg at 12/22/15 0249  . insulin aspart (novoLOG) injection 0-5 Units  0-5 Units Subcutaneous QHS Hildred Priest, MD      . insulin aspart (novoLOG) injection 0-9 Units  0-9 Units Subcutaneous TID WC Hildred Priest, MD   1 Units at 12/30/15 1240  . insulin glargine (LANTUS) injection 45 Units  45 Units Subcutaneous Daily Clovis Fredrickson, MD   45 Units at 12/29/15 1758  . ketorolac (TORADOL) 30 MG/ML injection 30 mg  30 mg Intravenous Once Gonzella Lex, MD      . lamoTRIgine (LAMICTAL) tablet 400 mg  400 mg Oral QHS Hildred Priest, MD   400 mg at 12/29/15 2114  . LORazepam (ATIVAN) tablet 2 mg  2 mg Oral Q2H PRN Gonzella Lex, MD   2 mg at 12/30/15 0025  . magnesium hydroxide (MILK OF MAGNESIA) suspension 30 mL  30 mL Oral Daily PRN Hildred Priest, MD      . midazolam (VERSED) injection 2 mg  2 mg Intravenous Once Gonzella Lex, MD      . midazolam (VERSED) injection 4 mg  4 mg Intravenous Once Gonzella Lex, MD      . QUEtiapine (SEROQUEL) tablet 800 mg  800 mg Oral QHS Hildred Priest, MD   800 mg at 12/29/15 2114  . temazepam (RESTORIL) capsule 30 mg  30 mg Oral QHS Jolanta B Pucilowska, MD   30 mg at 12/29/15 2114  . warfarin (COUMADIN) tablet 8 mg  8 mg Oral q1800 Gonzella Lex, MD   8 mg  at 12/29/15 1701  . Warfarin - Pharmacist Dosing Inpatient   Does not apply [id number] Hildred Priest, MD        Lab Results:  Results for orders placed or performed during the hospital encounter of 12/11/15 (from the past 48 hour(s))  Glucose, capillary  Status: Abnormal   Collection Time: 12/28/15  8:44 PM  Result Value Ref Range   Glucose-Capillary 116 (H) 65 - 99 mg/dL  Glucose, capillary     Status: Abnormal   Collection Time: 12/29/15  6:46 AM  Result Value Ref Range   Glucose-Capillary 147 (H) 65 - 99 mg/dL  Protime-INR     Status: Abnormal   Collection Time: 12/29/15  6:50 AM  Result Value Ref Range   Prothrombin Time 30.0 (H) 11.4 - 15.2 seconds   INR 2.79   CBC     Status: Abnormal   Collection Time: 12/29/15  6:50 AM  Result Value Ref Range   WBC 3.9 3.8 - 10.6 K/uL   RBC 4.10 (L) 4.40 - 5.90 MIL/uL   Hemoglobin 12.1 (L) 13.0 - 18.0 g/dL   HCT 34.9 (L) 40.0 - 52.0 %   MCV 85.3 80.0 - 100.0 fL   MCH 29.6 26.0 - 34.0 pg   MCHC 34.7 32.0 - 36.0 g/dL   RDW 12.6 11.5 - 14.5 %   Platelets 255 150 - 440 K/uL  Glucose, capillary     Status: Abnormal   Collection Time: 12/29/15  4:48 PM  Result Value Ref Range   Glucose-Capillary 108 (H) 65 - 99 mg/dL  Glucose, capillary     Status: Abnormal   Collection Time: 12/29/15  8:18 PM  Result Value Ref Range   Glucose-Capillary 104 (H) 65 - 99 mg/dL  Protime-INR     Status: Abnormal   Collection Time: 12/30/15  6:34 AM  Result Value Ref Range   Prothrombin Time 29.6 (H) 11.4 - 15.2 seconds   INR 2.74   Glucose, capillary     Status: None   Collection Time: 12/30/15  6:41 AM  Result Value Ref Range   Glucose-Capillary 94 65 - 99 mg/dL  Glucose, capillary     Status: Abnormal   Collection Time: 12/30/15 11:51 AM  Result Value Ref Range   Glucose-Capillary 147 (H) 65 - 99 mg/dL   Comment 1 Notify RN   Glucose, capillary     Status: Abnormal   Collection Time: 12/30/15  4:26 PM  Result Value Ref Range    Glucose-Capillary 148 (H) 65 - 99 mg/dL    Blood Alcohol level:  Lab Results  Component Value Date   ETH <5 [id number]    Metabolic Disorder Labs: Lab Results  Component Value Date   HGBA1C 8.3 (H) 12/04/2015   MPG 192 12/04/2015   MPG 318 (H) 08/24/2010   Lab Results  Component Value Date   PROLACTIN 4.4 12/11/2015   Lab Results  Component Value Date   CHOL 143 12/11/2015   TRIG 286 (H) 12/11/2015   HDL 32 (L) 12/11/2015   CHOLHDL 4.5 12/11/2015   VLDL 57 (H) 12/11/2015   LDLCALC 54 12/11/2015   LDLCALC 56 12/05/2015    Physical Findings: AIMS:  , ,  ,  ,    CIWA:    COWS:     Musculoskeletal: Strength & Muscle Tone: within normal limits Gait & Station: normal Patient leans: N/A  Psychiatric Specialty Exam: Physical Exam  Nursing note and vitals reviewed. Constitutional: He is oriented to person, place, and time. He appears well-developed and well-nourished.  HENT:  Head: Normocephalic and atraumatic.  Eyes: Conjunctivae and EOM are normal. Pupils are equal, round, and reactive to light.  Neck: Normal range of motion.  Cardiovascular: Normal heart sounds.   Respiratory: Effort normal.  GI: Soft.  Musculoskeletal: Normal  range of motion.  Neurological: He is alert and oriented to person, place, and time.  Skin: Skin is warm and dry.     Psychiatric: His affect is labile and inappropriate. His speech is rapid and/or pressured and tangential. He is agitated and hyperactive. Thought content is delusional. Cognition and memory are impaired. He expresses impulsivity. He expresses no suicidal ideation. He is inattentive.    Review of Systems  Constitutional: Negative.   HENT: Negative.   Eyes: Negative.   Respiratory: Negative.   Cardiovascular: Negative.   Gastrointestinal: Positive for nausea.  Genitourinary: Negative.   Musculoskeletal: Negative.   Skin: Negative.   Neurological: Negative.   Endo/Heme/Allergies: Negative.   Psychiatric/Behavioral:  Negative.  Negative for depression, hallucinations, substance abuse and suicidal ideas.    Blood pressure 119/61, pulse (!) 101, temperature 98.5 F (36.9 C), resp. rate 20, height 6' (1.829 m), weight 136.1 kg (300 lb), SpO2 98 %.Body mass index is 40.69 kg/m.  General Appearance: Disheveled  Eye Contact:  Fair  Speech:  Pressured  Volume:  Increased  Mood:  Euphoric  Affect:  Inappropriate, Labile and Full Range  Thought Process:  Irrelevant  Orientation:  Full (Time, Place, and Person)  Thought Content:  Illogical  Suicidal Thoughts:  No  Homicidal Thoughts:  No  Memory:  Immediate;   Fair Recent;   Poor Remote;   Fair  Judgement:  Fair  Insight:  Good and Fair  Psychomotor Activity:  Increased and Restlessness  Concentration:  Concentration: Poor  Recall:  [place] of Knowledge:  Good  Language:  Good  Akathisia:  No  Handed:  Right  AIMS (if indicated):     Assets:  Agricultural consultant Housing Resilience Social Support  ADL's:  Intact  Cognition:  WNL  Sleep:  Number of Hours: 2.5     Treatment Plan Summary: Daily contact with patient to assess and evaluate symptoms and progress in treatment, Medication management and Plan Again, no change to psychiatric medicine. Next ECTs treatment scheduled for Monday. Dr. Thurmond Butts has tried to convince him to agree to a total of 8 treatments and the patient is acting as though he is likely to agree to this. Continue his anticoagulation. We discussed wound management today. Apparently the wound nurses do not think they need to do anything further. Nursing will continue to watch out for his legs. Patient realizes he needs to keep them clean. Follow-up Monday.   No changes to his medications.  Appears to be slowly improving.   Plan to continue ECT next week  Pepcid order bid Hildred Priest, MD 12/30/2015, 4:31 PM

## 2015-12-30 NOTE — Progress Notes (Signed)
Pt was up pacing halls most of the night. Redirected several times for being loud and whistling in hall way. Pt  slept 2.5 hours.

## 2015-12-30 NOTE — [facility] Group Notes (Signed)
[facility] LCSW Group Therapy  12/30/2015 2:57 PM  Type of Therapy:  Group Therapy  Participation Level:  None, pt came to group but left group after about five minutes. Pt did not return to group.   Summary of Progress/Problems: Self care: Patients discussed self care and how it impacts them. Patients were asked to define self care in their own words. They discussed what aspects in their lives has influenced their self care. Patients also discussed self care in the areas of self regulation/control, hygiene/appearance, sleep/relaxation, healthy leisure, healthy eating habits, exercise, inner peace/spirituality, self improvement, sobriety, and health management. They were challenged to identify changes that are needed in order to improve self care.  Shantel Helwig G. Cuming, [place] 12/30/2015, 3:01 PM

## 2015-12-30 NOTE — Progress Notes (Signed)
Pt hyper verbal and restless. Flight of ideas. Compliant with prescribed medications. Currently, pacing halls. Was redirected not to whistle in halls after other Pts had gone to bed. Believes that headlights outside are his wife sitting in car waiting to come in and visit. PRN ativan given to Pt to reduce anxiety. Effectiveness to be determined. Q15 minute checks maintained for safety. Medications given as prescribed. Will continue to monitor.

## 2015-12-30 NOTE — Progress Notes (Signed)
ANTICOAGULATION CONSULT NOTE - Follow Up Consult  Pharmacy Consult for Warfarin Indication: VTE treatment  Allergies  Allergen Reactions  . Asa [Aspirin] Other (See Comments)    Patient tolerates LOW DOSE ASPIRIN.    Patient Measurements: Height: 6' (182.9 cm) Weight: 300 lb (136.1 kg) IBW/kg (Calculated) : 77.6  Vital Signs: BP: 119/61 (07/29 0656) Pulse Rate: 101 (07/29 0656)  Labs:  Recent Labs  12/28/15 0626 12/29/15 0650 12/30/15 0634  HGB  --  12.1*  --   HCT  --  34.9*  --   PLT  --  255  --   LABPROT 33.8* 30.0* 29.6*  INR 3.24 2.79 2.74   Estimated Creatinine Clearance: 78 mL/min (by C-G formula based on SCr of 1.6 mg/dL).  Medical History: Past Medical History:  Diagnosis Date  . Bipolar 1 disorder ([place])   . CKD (chronic kidney disease), stage III   . Diabetes mellitus without complication ([place])   . DVT (deep venous thrombosis) ([place])   . Hypercholesteremia   . Hypertension    Medications:  Warfarin 10 mg po daily  Assessment: 32 yom admitted to ED [facility] with manic symptoms. Recently discharged from [facility] with DVT/PE, had been bridging LMWH and VKA prior to admission.  INR History: 7/9: INR - 1.99 7/10: no INR- warfarin 12.5 mg po daily at 0200 7/11: INR - 2.97- warfarin held 7/12: INR - 1.92 - warfarin 10 mg 7/13: INR - 1.95 - warfarin 10 mg 7/14: INR - 2.56 - warfarin 5 mg 7/15: INR - 2.73 - warfarin 5 mg 7/16: INR - 2.39 - warfarin 7.5 mg 7/17: INR - 2.22 - warfarin 7.5 mg 7/18: INR - 2.53 - warfarin 7.5 mg 7/19: INR - 2.75 - warfarin 7.5 mg 7/20: INR - 2.99 - warfarin 6.5 mg 7/21: INR - 2.64 - warfarin 6.5 mg  7/22: INR: 2.44; warfarin 8  7/23: INR: 2.60;  Warfarin 10mg  7/24: INR 2.44 - warfarin 10mg  7/25:  INR 3.22 - warfarin 10mg  7/26:  INR 3.08 - warfarin 8mg  7/27:  INR 3.24 - warfarin 8 mg 7/28:  INR 2.79 - 8 mg 7/29:  INR 2.74  Goal of Therapy:  INR 2-3 Monitor platelets by anticoagulation protocol: Yes   Plan:  INR within  goal range. Will continue warfarin 8mg  PO daily.  Follow up INR in AM.   (Patient with orders for carbamazepine which interacts with warfarin to decrease the INR.  Will follow INR closely.)   Pt will need CBC q3 days per policy.   Pharmacy will continue to follow.   Salam Chesterfield A, RPh 12/30/2015,8:58 AM

## 2015-12-30 NOTE — Progress Notes (Addendum)
Patient with blunted affect, slightly calmer mood. Patient with slower motor activity. Remains hyper verbal and is intrusive with patients and staff. Patient needy with frequent requests to nurses station at a rate of one request per 15 minutes. Overly social with peers. If needs are not met immediately patient becomes demanding, and demeaning to staff. Does well with support and encouragement. No SI/HI at this time. Speaks with wife of phone long distance this am. Safety maintained. After speaking with wife patient is noted to be in a quiet sad mood. Patient states "my wife made me feel this way". Patient quiet and withdrawn in room. Denies SI/HI at this time. One on one with fair effect, discussed with patient importance of good night sleep and encouraged to stay awake at this time.

## 2015-12-31 LAB — GLUCOSE, CAPILLARY
Glucose-Capillary: 87 mg/dL (ref 65–99)
Glucose-Capillary: 173 mg/dL — ABNORMAL HIGH (ref 65–99)
Glucose-Capillary: 192 mg/dL — ABNORMAL HIGH (ref 65–99)
Glucose-Capillary: 128 mg/dL — ABNORMAL HIGH (ref 65–99)

## 2015-12-31 LAB — PROTIME-INR
INR: 3.19
PROTHROMBIN TIME: 33.4 s — AB (ref 11.4–15.2)

## 2015-12-31 MED ORDER — WARFARIN SODIUM 4 MG PO TABS
7.0000 mg | ORAL_TABLET | Freq: Every day | ORAL | Status: DC
  Administered 2015-12-31: 7 mg via ORAL
  Filled 2015-12-31: qty 1

## 2015-12-31 NOTE — Plan of Care (Signed)
Problem: Coping: Goal: Ability to interact with others will improve Outcome: Progressing Patient ability to interact with others slightly improved. Patient speech less pressured and patient able to listen to education rt bipolar disorder. Patient thinking process remains impaired. Patient unable or unwilling to focus on s/s of depression and/or mania. Patient's remains focused on what he perceives other people to be doing.

## 2015-12-31 NOTE — [facility] Group Notes (Signed)
Major Group Notes:  (Nursing/MHT/Case Management/Adjunct)  Date:  12/31/2015  Time:  10:46 PM  Type of Therapy:  Psychoeducational Skills  Participation Level:  Active  Participation Quality:  Appropriate  Affect:  Appropriate  Cognitive:  Appropriate  Insight:  Appropriate and Good  Engagement in Group:  Engaged  Modes of Intervention:  Discussion, Socialization and Support  Summary of Progress/Problems:  [name] 12/31/2015, 10:46 PM

## 2015-12-31 NOTE — Plan of Care (Signed)
Problem: Education: Goal: Will be free of psychotic symptoms Outcome: Progressing Pt very calm this evening. Pt was not hyperverbal. Pt was not expressing any delusions.

## 2015-12-31 NOTE — Progress Notes (Addendum)
Patient with depressed affect, cooperative with meals, meds and plan of care. Patient with pressured speech and remains tangential. Patient perseverates on what he perceives and how things appear to work. Patient states his goal is to focus on him, but patient focuses on others and states he focuses on his wife to much. Encouragement and support to focus on bipolar disorder and his s/s and how best he can manage bipolar disorder for himself. Self care encouraged with fair effect at this time. Does well with one on one of staff. Weepy at times during discussions. No SI/HI at this time, safety maintained. Patient ability to interact with others slightly improved. Patient speech less pressured and patient able to listen to education rt bipolar disorder. Patient thinking process remains impaired. Patient unable or unwilling to focus on s/s of depression and/or mania. Patient's remains focused on what he perceives other people to be doing.

## 2015-12-31 NOTE — Progress Notes (Signed)
[facility] MD Progress Note  12/31/2015 10:56 AM Cameron [name]  MRN:  [id number] Subjective:  This is a 50 year old man with bipolar disorder currently in a manic phase. 2 weeks into his hospitalization. In addition to manic bipolar disorder also has a history of a pulmonary embolism related to deep vein thromboses, diabetes, cellulitis, elevated cholesterol  Follow-up 50 year old man with mania. Third ECT treatment On 7/28. Tolerated well. He seems much better to me. He is able to talk with me fairly lucidly. Even though he still has flight of ideas he does not seem to be as psychotic. He is much more pleasant and easy going and showing some improvement in insight. Had a visit today from Dr. Thurmond Butts which I think was very helpful in supporting the treatment   Today talking with pushed speech, still hyperverbal. Thought process is less disorganized. Conversation was  easier to follow.  He denies problems with mood, appetite, energy, sleep or concentration. He says he's here in the hospital just to prove to his wife that he is okay  Compliant with medications.  No aggression or agitation.  He slept much better last night 8 hours  Yesterday he was complaining of nausea.  He received Pepcid and has not complained of nausea today. Yesterday he spiked a low fever in the evening but afebrile this morning  Principal Problem: Bipolar I disorder, current or most recent episode manic, with psychotic features ([place]) Diagnosis:   Patient Active Problem List   Diagnosis Date Noted  . Bipolar I disorder, current or most recent episode manic, with psychotic features ([place]) [F31.2] 12/11/2015  . Pulmonary embolism ([place]) [I26.99] 12/04/2015  . Diabetes mellitus without complication ([place]) [id number]   . Hypercholesteremia [E78.00]   . CKD (chronic kidney disease), stage III [N18.3]   . Acute deep vein thrombosis (DVT) of femoral vein of right lower extremity (HCC) [I82.411]    Total Time spent with patient: 30  minutes  Past Psychiatric History: Long-standing history of bipolar disorder with multiple episodes of mania. Also has had depression. Very sensitive to changes in his medicine.  Past Medical History:  Past Medical History:  Diagnosis Date  . Bipolar 1 disorder ([place])   . CKD (chronic kidney disease), stage III   . Diabetes mellitus without complication ([place])   . DVT (deep venous thrombosis) ([place])   . Hypercholesteremia   . Hypertension     Past Surgical History:  Procedure Laterality Date  . APPENDECTOMY     Family History:  Family History  Problem Relation Age of Onset  . Stroke Other   . Diabetes Other    Family Psychiatric  History: Positive for bipolar disorder in at least 2 first-degree relatives Social History:  History  Alcohol Use  . Yes    Comment: rarely     History  Drug Use No    Social History   Social History  . Marital status: Married    Spouse name: N/A  . Number of children: N/A  . Years of education: N/A   Social History Main Topics  . Smoking status: Never Smoker  . Smokeless tobacco: None  . Alcohol use Yes     Comment: rarely  . Drug use: No  . Sexual activity: Not Asked   Other Topics Concern  . None   Social History Narrative  . None   Additional Social History:  Sleep: Poor  Appetite:  Good  Current Medications: Current Facility-Administered Medications  Medication Dose Route Frequency Provider Last Rate Last Dose  . acetaminophen (TYLENOL) tablet 650 mg  650 mg Oral Q6H PRN Hildred Priest, MD   650 mg at 12/30/15 0110  . alum & mag hydroxide-simeth (MAALOX/MYLANTA) 200-200-20 MG/5ML suspension 30 mL  30 mL Oral Q4H PRN Hildred Priest, MD      . ammonium lactate (LAC-HYDRIN) 12 % lotion   Topical q morning - 10a Gonzella Lex, MD      . ARIPiprazole (ABILIFY) tablet 30 mg  30 mg Oral QPM Hildred Priest, MD   30 mg at 12/30/15 1717  . atorvastatin (LIPITOR)  tablet 40 mg  40 mg Oral Daily Hildred Priest, MD   40 mg at 12/31/15 0904  . canagliflozin (INVOKANA) tablet 100 mg  100 mg Oral BH-q7a Clovis Fredrickson, MD   100 mg at 12/31/15 0639  . carbamazepine (TEGRETOL XR) 12 hr tablet 600 mg  600 mg Oral QHS Jolanta B Pucilowska, MD   600 mg at 12/30/15 2150  . carbamazepine (TEGRETOL) tablet 400 mg  400 mg Oral Q breakfast Hildred Priest, MD   400 mg at 12/31/15 0904  . dextrose 5 % solution 250 mL  250 mL Intravenous Once Gonzella Lex, MD      . dextrose 5 % solution 250 mL  250 mL Intravenous Once Gonzella Lex, MD      . famotidine (PEPCID) 40 MG/5ML suspension 20 mg  20 mg Oral BID Hildred Priest, MD   20 mg at 12/31/15 0904  . fenofibrate tablet 160 mg  160 mg Oral Daily Hildred Priest, MD   160 mg at 12/31/15 0904  . haloperidol (HALDOL) tablet 20 mg  20 mg Oral Q6H PRN Clovis Fredrickson, MD   20 mg at 12/22/15 0249  . insulin aspart (novoLOG) injection 0-5 Units  0-5 Units Subcutaneous QHS Hildred Priest, MD      . insulin aspart (novoLOG) injection 0-9 Units  0-9 Units Subcutaneous TID WC Hildred Priest, MD   1 Units at 12/30/15 1709  . insulin glargine (LANTUS) injection 45 Units  45 Units Subcutaneous Daily Clovis Fredrickson, MD   45 Units at 12/30/15 1716  . ketorolac (TORADOL) 30 MG/ML injection 30 mg  30 mg Intravenous Once Gonzella Lex, MD      . lamoTRIgine (LAMICTAL) tablet 400 mg  400 mg Oral QHS Hildred Priest, MD   400 mg at 12/30/15 2150  . LORazepam (ATIVAN) tablet 2 mg  2 mg Oral Q2H PRN Gonzella Lex, MD   2 mg at 12/30/15 0025  . magnesium hydroxide (MILK OF MAGNESIA) suspension 30 mL  30 mL Oral Daily PRN Hildred Priest, MD      . midazolam (VERSED) injection 2 mg  2 mg Intravenous Once Gonzella Lex, MD      . midazolam (VERSED) injection 4 mg  4 mg Intravenous Once Gonzella Lex, MD      . ondansetron (ZOFRAN) tablet 4 mg   4 mg Oral Q8H PRN Hildred Priest, MD      . QUEtiapine (SEROQUEL) tablet 800 mg  800 mg Oral QHS Hildred Priest, MD   800 mg at 12/30/15 2150  . temazepam (RESTORIL) capsule 30 mg  30 mg Oral QHS Jolanta B Pucilowska, MD   30 mg at 12/30/15 2150  . warfarin (COUMADIN) tablet 7 mg  7 mg Oral q1800 Hildred Priest,  MD      . Warfarin - Pharmacist Dosing Inpatient   Does not apply [id number] Hildred Priest, MD        Lab Results:  Results for orders placed or performed during the hospital encounter of 12/11/15 (from the past 48 hour(s))  Glucose, capillary     Status: Abnormal   Collection Time: 12/29/15  4:48 PM  Result Value Ref Range   Glucose-Capillary 108 (H) 65 - 99 mg/dL  Glucose, capillary     Status: Abnormal   Collection Time: 12/29/15  8:18 PM  Result Value Ref Range   Glucose-Capillary 104 (H) 65 - 99 mg/dL  Protime-INR     Status: Abnormal   Collection Time: 12/30/15  6:34 AM  Result Value Ref Range   Prothrombin Time 29.6 (H) 11.4 - 15.2 seconds   INR 2.74   Glucose, capillary     Status: None   Collection Time: 12/30/15  6:41 AM  Result Value Ref Range   Glucose-Capillary 94 65 - 99 mg/dL  Glucose, capillary     Status: Abnormal   Collection Time: 12/30/15 11:51 AM  Result Value Ref Range   Glucose-Capillary 147 (H) 65 - 99 mg/dL   Comment 1 Notify RN   Glucose, capillary     Status: Abnormal   Collection Time: 12/30/15  4:26 PM  Result Value Ref Range   Glucose-Capillary 148 (H) 65 - 99 mg/dL  Glucose, capillary     Status: Abnormal   Collection Time: 12/30/15  9:32 PM  Result Value Ref Range   Glucose-Capillary 124 (H) 65 - 99 mg/dL  Protime-INR     Status: Abnormal   Collection Time: 12/31/15  6:19 AM  Result Value Ref Range   Prothrombin Time 33.4 (H) 11.4 - 15.2 seconds   INR 3.19   Glucose, capillary     Status: None   Collection Time: 12/31/15  6:21 AM  Result Value Ref Range   Glucose-Capillary 87 65 - 99 mg/dL     Blood Alcohol level:  Lab Results  Component Value Date   ETH <5 [id number]    Metabolic Disorder Labs: Lab Results  Component Value Date   HGBA1C 8.3 (H) 12/04/2015   MPG 192 12/04/2015   MPG 318 (H) 08/24/2010   Lab Results  Component Value Date   PROLACTIN 4.4 12/11/2015   Lab Results  Component Value Date   CHOL 143 12/11/2015   TRIG 286 (H) 12/11/2015   HDL 32 (L) 12/11/2015   CHOLHDL 4.5 12/11/2015   VLDL 57 (H) 12/11/2015   LDLCALC 54 12/11/2015   LDLCALC 56 12/05/2015    Physical Findings: AIMS:  , ,  ,  ,    CIWA:    COWS:     Musculoskeletal: Strength & Muscle Tone: within normal limits Gait & Station: normal Patient leans: N/A  Psychiatric Specialty Exam: Physical Exam  Nursing note and vitals reviewed. Constitutional: He is oriented to person, place, and time. He appears well-developed and well-nourished.  HENT:  Head: Normocephalic and atraumatic.  Eyes: Conjunctivae and EOM are normal. Pupils are equal, round, and reactive to light.  Neck: Normal range of motion.  Cardiovascular: Normal heart sounds.   Respiratory: Effort normal.  GI: Soft.  Musculoskeletal: Normal range of motion.  Neurological: He is alert and oriented to person, place, and time.  Skin: Skin is warm and dry.     Psychiatric: His affect is labile and inappropriate. His speech is rapid and/or pressured and tangential. He  is agitated and hyperactive. Thought content is delusional. Cognition and memory are impaired. He expresses impulsivity. He expresses no suicidal ideation. He is inattentive.    Review of Systems  Constitutional: Negative.   HENT: Negative.   Eyes: Negative.   Respiratory: Negative.   Cardiovascular: Negative.   Gastrointestinal: Positive for nausea.  Genitourinary: Negative.   Musculoskeletal: Negative.   Skin: Negative.   Neurological: Negative.   Endo/Heme/Allergies: Negative.   Psychiatric/Behavioral: Negative.  Negative for depression,  hallucinations, substance abuse and suicidal ideas.    Blood pressure (!) 114/53, pulse (!) 102, temperature 98.7 F (37.1 C), temperature source Oral, resp. rate 14, height 6' (1.829 m), weight 136.1 kg (300 lb), SpO2 96 %.Body mass index is 40.69 kg/m.  General Appearance: Disheveled  Eye Contact:  Fair  Speech:  Pressured  Volume:  Increased  Mood:  Euphoric  Affect:  Inappropriate, Labile and Full Range  Thought Process:  Irrelevant  Orientation:  Full (Time, Place, and Person)  Thought Content:  Illogical  Suicidal Thoughts:  No  Homicidal Thoughts:  No  Memory:  Immediate;   Fair Recent;   Poor Remote;   Fair  Judgement:  Fair  Insight:  Good and Fair  Psychomotor Activity:  Increased and Restlessness  Concentration:  Concentration: Poor  Recall:  [place] of Knowledge:  Good  Language:  Good  Akathisia:  No  Handed:  Right  AIMS (if indicated):     Assets:  Agricultural consultant Housing Resilience Social Support  ADL's:  Intact  Cognition:  WNL  Sleep:  Number of Hours: 8.75     Treatment Plan Summary: Daily contact with patient to assess and evaluate symptoms and progress in treatment, Medication management and Plan Again, no change to psychiatric medicine. Next ECTs treatment scheduled for Monday. Dr. Thurmond Butts has tried to convince him to agree to a total of 8 treatments and the patient is acting as though he is likely to agree to this. Continue his anticoagulation. We discussed wound management today. Apparently the wound nurses do not think they need to do anything further. Nursing will continue to watch out for his legs. Patient realizes he needs to keep them clean. Follow-up Monday.   No changes to his medications.  Appears to be slowly improving.   Plan to continue ECT next week  Pepcid order bid Hildred Priest, MD 12/31/2015, 10:56 AM

## 2015-12-31 NOTE — BHH Group Notes (Signed)
[facility] LCSW Group Therapy  12/31/2015 2:46 PM  Type of Therapy:  Group Therapy  Participation Level:  Minimal  Participation Quality:  Attentive  Affect:  Excited  Cognitive:  Disorganized  Insight:  Limited  Engagement in Therapy:  Limited  Modes of Intervention:  Activity, Discussion, Education, Reality Testing and Support  Summary of Progress/Problems: Self-responsibility/accountability- Patients discussed self responsibility/accountability  and how it impacts them. Patients were asked to define these concepts in their own words. They discussed taking ownership of their actions and the challenges they have with taking accountability for their self. CSW introduced "YOU vs. I" statements and explained how "I" statements identifies how they feel about the situation without being threatening or offensive to others and could help improve their communication with others. Examples were provided of each. They were challenged to identify changes that are needed in order to improve self responsibility/accountability. CSW provided inspirational quotes that focused on the patients taking accountability for actions both good and bad. Patients were asked to read the quotes out loud and share their thoughts with the group about their quote. Pt was attentive during group and was minimally disruptive to the group discussion. Pt left group for about 15 minutes but later returned. Although he minimally engaged, pt was able to support another peer on their progress to achieving their goals. CSW provided positive feedback to pt for providing support to his peer.   Clarita Mcelvain G. [place], [place] 12/31/2015, 2:51 PM

## 2015-12-31 NOTE — Progress Notes (Signed)
D: Observed pt in room laying in bed. Patient alert and oriented x4. Patient denies SI/HI/AVH. Pt affect is depressed. Pt very calm, to the point of having a depressed affect. Pt talked minimally this evening. Pt was more lethargic and much less interactive than usual. Pt rated depression 9/10 and anxiety 9/10. Pt stated "I'm just feeling crappy." Pt stated he felt hot. Pt temperature was 100.2. Pt denied pain. Pt visited with wife and daughter. A: Offered active listening and support. Provided therapeutic communication. Contacted Dr. Jerilee Hoh in regards to pt temperature.Gave tylenol per doctor's orders. Advised pt to talk to staff if develop any change in symptoms.  Administered scheduled medications.  R: Pt pleasant and cooperative. Pt medication compliant. Will continue Q15 min. checks. Safety maintained.

## 2015-12-31 NOTE — Progress Notes (Signed)
ANTICOAGULATION CONSULT NOTE - Follow Up Consult  Pharmacy Consult for Warfarin Indication: VTE treatment  Allergies  Allergen Reactions  . Asa [Aspirin] Other (See Comments)    Patient tolerates LOW DOSE ASPIRIN.    Patient Measurements: Height: 6' (182.9 cm) Weight: 300 lb (136.1 kg) IBW/kg (Calculated) : 77.6  Vital Signs: Temp: 98.7 F (37.1 C) (07/30 0658) Temp Source: Oral (07/30 0658) BP: 114/53 (07/30 0658) Pulse Rate: 102 (07/30 0658)  Labs:  Recent Labs  12/29/15 0650 12/30/15 0634 12/31/15 0619  HGB 12.1*  --   --   HCT 34.9*  --   --   PLT 255  --   --   LABPROT 30.0* 29.6* 33.4*  INR 2.79 2.74 3.19   Estimated Creatinine Clearance: 78 mL/min (by C-G formula based on SCr of 1.6 mg/dL).  Medical History: Past Medical History:  Diagnosis Date  . Bipolar 1 disorder ([place])   . CKD (chronic kidney disease), stage III   . Diabetes mellitus without complication ([place])   . DVT (deep venous thrombosis) ([place])   . Hypercholesteremia   . Hypertension    Medications:  Warfarin 10 mg po daily  Assessment: 72 yom admitted to ED BHU with manic symptoms. Recently discharged from [facility] with DVT/PE, had been bridging LMWH and VKA prior to admission.  INR History: 7/9: INR - 1.99 7/10: no INR- warfarin 12.5 mg po daily at 0200 7/11: INR - 2.97- warfarin held 7/12: INR - 1.92 - warfarin 10 mg 7/13: INR - 1.95 - warfarin 10 mg 7/14: INR - 2.56 - warfarin 5 mg 7/15: INR - 2.73 - warfarin 5 mg 7/16: INR - 2.39 - warfarin 7.5 mg 7/17: INR - 2.22 - warfarin 7.5 mg 7/18: INR - 2.53 - warfarin 7.5 mg 7/19: INR - 2.75 - warfarin 7.5 mg 7/20: INR - 2.99 - warfarin 6.5 mg 7/21: INR - 2.64 - warfarin 6.5 mg  7/22: INR: 2.44; warfarin 8  7/23: INR: 2.60;  Warfarin 10mg  7/24: INR 2.44 - warfarin 10mg  7/25:  INR 3.22 - warfarin 10mg  7/26:  INR 3.08 - warfarin 8mg  7/27:  INR 3.24 - warfarin 8 mg 7/28:  INR 2.79 - 8 mg 7/29:  INR 2.74- 8 mg 7/30:  INR 3.19  Goal of  Therapy:  INR 2-3 Monitor platelets by anticoagulation protocol: Yes   Plan:  INR slightly above goal range. Will decrease to warfarin 7 mg PO daily.  Follow up INR in AM.   (Patient with orders for carbamazepine which interacts with warfarin to decrease the INR.  Will follow INR closely.)   Pt will need CBC q3 days per policy.   Pharmacy will continue to follow.   Malahki Gasaway A, RPh 12/31/2015,8:49 AM

## 2016-01-01 ENCOUNTER — Inpatient Hospital Stay: Payer: 59

## 2016-01-01 ENCOUNTER — Encounter: Payer: Self-pay | Admitting: Psychiatry

## 2016-01-01 ENCOUNTER — Other Ambulatory Visit: Payer: Self-pay | Admitting: Psychiatry

## 2016-01-01 ENCOUNTER — Observation Stay
Admission: AD | Admit: 2016-01-01 | Discharge: 2016-01-04 | DRG: 603 | Disposition: A | Discharge status: Other Acute Inpatient Hospital | Payer: 59 | Source: Ambulatory Visit | Attending: Internal Medicine | Admitting: Internal Medicine

## 2016-01-01 ENCOUNTER — Encounter: Payer: Self-pay | Admitting: *Deleted

## 2016-01-01 ENCOUNTER — Inpatient Hospital Stay: Payer: 59 | Admitting: Anesthesiology

## 2016-01-01 DIAGNOSIS — E1122 Type 2 diabetes mellitus with diabetic chronic kidney disease: Secondary | ICD-10-CM | POA: Diagnosis present

## 2016-01-01 DIAGNOSIS — Z6839 Body mass index (BMI) 39.0-39.9, adult: Secondary | ICD-10-CM

## 2016-01-01 DIAGNOSIS — L03115 Cellulitis of right lower limb: Secondary | ICD-10-CM | POA: Diagnosis not present

## 2016-01-01 DIAGNOSIS — Z823 Family history of stroke: Secondary | ICD-10-CM

## 2016-01-01 DIAGNOSIS — Z833 Family history of diabetes mellitus: Secondary | ICD-10-CM

## 2016-01-01 DIAGNOSIS — I129 Hypertensive chronic kidney disease with stage 1 through stage 4 chronic kidney disease, or unspecified chronic kidney disease: Secondary | ICD-10-CM | POA: Diagnosis present

## 2016-01-01 DIAGNOSIS — Z86711 Personal history of pulmonary embolism: Secondary | ICD-10-CM

## 2016-01-01 DIAGNOSIS — E7800 Pure hypercholesterolemia, unspecified: Secondary | ICD-10-CM | POA: Diagnosis present

## 2016-01-01 DIAGNOSIS — Z7901 Long term (current) use of anticoagulants: Secondary | ICD-10-CM

## 2016-01-01 DIAGNOSIS — Z9119 Patient's noncompliance with other medical treatment and regimen: Secondary | ICD-10-CM

## 2016-01-01 DIAGNOSIS — E669 Obesity, unspecified: Secondary | ICD-10-CM | POA: Diagnosis present

## 2016-01-01 DIAGNOSIS — I82501 Chronic embolism and thrombosis of unspecified deep veins of right lower extremity: Secondary | ICD-10-CM | POA: Diagnosis present

## 2016-01-01 DIAGNOSIS — Z794 Long term (current) use of insulin: Secondary | ICD-10-CM

## 2016-01-01 DIAGNOSIS — F312 Bipolar disorder, current episode manic severe with psychotic features: Secondary | ICD-10-CM | POA: Diagnosis present

## 2016-01-01 DIAGNOSIS — I872 Venous insufficiency (chronic) (peripheral): Secondary | ICD-10-CM | POA: Diagnosis present

## 2016-01-01 DIAGNOSIS — E785 Hyperlipidemia, unspecified: Secondary | ICD-10-CM | POA: Diagnosis present

## 2016-01-01 DIAGNOSIS — N183 Chronic kidney disease, stage 3 (moderate): Secondary | ICD-10-CM | POA: Diagnosis present

## 2016-01-01 LAB — CBC
HCT: 33.3 % — ABNORMAL LOW (ref 40.0–52.0)
HEMOGLOBIN: 11.8 g/dL — AB (ref 13.0–18.0)
MCH: 29.6 pg (ref 26.0–34.0)
MCHC: 35.5 g/dL (ref 32.0–36.0)
MCV: 83.5 fL (ref 80.0–100.0)
Platelets: 220 10*3/uL (ref 150–440)
RBC: 3.99 MIL/uL — ABNORMAL LOW (ref 4.40–5.90)
RDW: 12.6 % (ref 11.5–14.5)
WBC: 5.5 10*3/uL (ref 3.8–10.6)

## 2016-01-01 LAB — BASIC METABOLIC PANEL
Anion gap: 9 (ref 5–15)
BUN: 24 mg/dL — AB (ref 6–20)
CHLORIDE: 102 mmol/L (ref 101–111)
CO2: 27 mmol/L (ref 22–32)
CREATININE: 1.73 mg/dL — AB (ref 0.61–1.24)
Calcium: 9 mg/dL (ref 8.9–10.3)
GFR calc Af Amer: 51 mL/min — ABNORMAL LOW (ref >60)
GFR calc non Af Amer: 44 mL/min — ABNORMAL LOW (ref >60)
Glucose, Bld: 119 mg/dL — ABNORMAL HIGH (ref 65–99)
POTASSIUM: 3.6 mmol/L (ref 3.5–5.1)
Sodium: 138 mmol/L (ref 135–145)

## 2016-01-01 LAB — PROTIME-INR
INR: 2.73
PROTHROMBIN TIME: 29.5 s — AB (ref 11.4–15.2)

## 2016-01-01 LAB — CBC WITH DIFFERENTIAL/PLATELET
BASOS ABS: 0 10*3/uL (ref 0–0.1)
BASOS PCT: 0 %
EOS PCT: 0 %
Eosinophils Absolute: 0 10*3/uL (ref 0–0.7)
HCT: 33.1 % — ABNORMAL LOW (ref 40.0–52.0)
Hemoglobin: 11.7 g/dL — ABNORMAL LOW (ref 13.0–18.0)
LYMPHS PCT: 16 %
Lymphs Abs: 0.8 10*3/uL — ABNORMAL LOW (ref 1.0–3.6)
MCH: 29.6 pg (ref 26.0–34.0)
MCHC: 35.3 g/dL (ref 32.0–36.0)
MCV: 84.1 fL (ref 80.0–100.0)
Monocytes Absolute: 0.6 10*3/uL (ref 0.2–1.0)
Monocytes Relative: 13 %
NEUTROS ABS: 3.4 10*3/uL (ref 1.4–6.5)
Neutrophils Relative %: 71 %
PLATELETS: 238 10*3/uL (ref 150–440)
RBC: 3.93 MIL/uL — AB (ref 4.40–5.90)
RDW: 12.5 % (ref 11.5–14.5)
WBC: 4.9 10*3/uL (ref 3.8–10.6)

## 2016-01-01 LAB — GLUCOSE, CAPILLARY
GLUCOSE-CAPILLARY: 137 mg/dL — AB (ref 65–99)
Glucose-Capillary: 117 mg/dL — ABNORMAL HIGH (ref 65–99)
Glucose-Capillary: 105 mg/dL — ABNORMAL HIGH (ref 65–99)

## 2016-01-01 MED ORDER — ONDANSETRON HCL 4 MG/2ML IJ SOLN: 4.0000 mg | Freq: Four times a day (QID) | INTRAMUSCULAR | Status: DC | PRN

## 2016-01-01 MED ORDER — CARBAMAZEPINE 200 MG PO TABS
600.0000 mg | ORAL_TABLET | Freq: Every evening | ORAL | Status: DC
  Administered 2016-01-01 – 2016-01-03 (×3): 600 mg via ORAL
  Filled 2016-01-01 (×3): qty 3

## 2016-01-01 MED ORDER — DOCUSATE SODIUM 100 MG PO CAPS
100.0000 mg | ORAL_CAPSULE | Freq: Two times a day (BID) | ORAL | Status: DC
  Administered 2016-01-01 – 2016-01-03 (×5): 100 mg via ORAL
  Filled 2016-01-01 (×5): qty 1

## 2016-01-01 MED ORDER — ARIPIPRAZOLE 10 MG PO TABS
30.0000 mg | ORAL_TABLET | Freq: Every evening | ORAL | Status: DC
  Administered 2016-01-01 – 2016-01-03 (×3): 30 mg via ORAL
  Filled 2016-01-01: qty 2
  Filled 2016-01-01: qty 3
  Filled 2016-01-01: qty 2

## 2016-01-01 MED ORDER — ENALAPRIL MALEATE 5 MG PO TABS
5.0000 mg | ORAL_TABLET | Freq: Two times a day (BID) | ORAL | Status: DC
  Administered 2016-01-01 – 2016-01-03 (×6): 5 mg via ORAL
  Filled 2016-01-01 (×6): qty 1

## 2016-01-01 MED ORDER — FENOFIBRATE 160 MG PO TABS
160.0000 mg | ORAL_TABLET | Freq: Every day | ORAL | Status: DC
  Administered 2016-01-01 – 2016-01-03 (×3): 160 mg via ORAL
  Filled 2016-01-01 (×3): qty 1

## 2016-01-01 MED ORDER — WARFARIN SODIUM 2.5 MG PO TABS: 7.5000 mg | ORAL_TABLET | Freq: Every day | ORAL | Status: DC

## 2016-01-01 MED ORDER — SODIUM CHLORIDE 0.9% FLUSH
3.0000 mL | Freq: Two times a day (BID) | INTRAVENOUS | Status: DC
  Administered 2016-01-01 – 2016-01-03 (×5): 3 mL via INTRAVENOUS

## 2016-01-01 MED ORDER — INSULIN ASPART 100 UNIT/ML ~~LOC~~ SOLN
0.0000 [IU] | Freq: Three times a day (TID) | SUBCUTANEOUS | Status: DC
  Administered 2016-01-02 (×2): 1 [IU] via SUBCUTANEOUS
  Filled 2016-01-01 (×2): qty 1

## 2016-01-01 MED ORDER — CANAGLIFLOZIN 100 MG PO TABS
100.0000 mg | ORAL_TABLET | Freq: Every day | ORAL | Status: DC
  Administered 2016-01-02: 100 mg via ORAL
  Filled 2016-01-01 (×5): qty 1

## 2016-01-01 MED ORDER — WARFARIN - PHARMACIST DOSING INPATIENT
Freq: Every day | Status: DC
  Administered 2016-01-01: 7.5
  Administered 2016-01-02: 19:00:00

## 2016-01-01 MED ORDER — TEMAZEPAM 15 MG PO CAPS: 15.0000 mg | ORAL_CAPSULE | Freq: Every evening | ORAL | Status: DC | PRN

## 2016-01-01 MED ORDER — ADULT MULTIVITAMIN W/MINERALS CH
1.0000 | ORAL_TABLET | Freq: Every day | ORAL | Status: DC
  Administered 2016-01-01 – 2016-01-03 (×3): 1 via ORAL
  Filled 2016-01-01 (×3): qty 1

## 2016-01-01 MED ORDER — INSULIN GLARGINE 100 UNIT/ML ~~LOC~~ SOLN
60.0000 [IU] | Freq: Every day | SUBCUTANEOUS | Status: DC
  Administered 2016-01-01 – 2016-01-02 (×2): 60 [IU] via SUBCUTANEOUS
  Filled 2016-01-01 (×4): qty 0.6

## 2016-01-01 MED ORDER — ONDANSETRON HCL 4 MG/2ML IJ SOLN: 4.0000 mg | Freq: Once | INTRAMUSCULAR | Status: DC | PRN

## 2016-01-01 MED ORDER — FENTANYL CITRATE (PF) 100 MCG/2ML IJ SOLN: 25.0000 ug | INTRAMUSCULAR | Status: DC | PRN

## 2016-01-01 MED ORDER — VANCOMYCIN HCL IN DEXTROSE 1-5 GM/200ML-% IV SOLN
1000.0000 mg | Freq: Once | INTRAVENOUS | Status: AC
  Administered 2016-01-01: 1000 mg via INTRAVENOUS
  Filled 2016-01-01: qty 200

## 2016-01-01 MED ORDER — LAMOTRIGINE 100 MG PO TABS
400.0000 mg | ORAL_TABLET | Freq: Every day | ORAL | Status: DC
  Administered 2016-01-01 – 2016-01-03 (×3): 400 mg via ORAL
  Filled 2016-01-01 (×3): qty 4

## 2016-01-01 MED ORDER — SODIUM CHLORIDE 0.9 % IV SOLN
INTRAVENOUS | Status: DC | PRN
  Administered 2016-01-01: 11:00:00 via INTRAVENOUS

## 2016-01-01 MED ORDER — ONDANSETRON HCL 4 MG PO TABS: 4.0000 mg | ORAL_TABLET | Freq: Four times a day (QID) | ORAL | Status: DC | PRN

## 2016-01-01 MED ORDER — METHOHEXITAL SODIUM 100 MG/10ML IV SOSY
PREFILLED_SYRINGE | INTRAVENOUS | Status: DC | PRN
  Administered 2016-01-01: 100 mg via INTRAVENOUS

## 2016-01-01 MED ORDER — ATORVASTATIN CALCIUM 20 MG PO TABS
40.0000 mg | ORAL_TABLET | Freq: Every day | ORAL | Status: DC
  Administered 2016-01-01 – 2016-01-03 (×3): 40 mg via ORAL
  Filled 2016-01-01 (×3): qty 2

## 2016-01-01 MED ORDER — INSULIN ASPART 100 UNIT/ML ~~LOC~~ SOLN: 0.0000 [IU] | Freq: Every day | SUBCUTANEOUS | Status: DC

## 2016-01-01 MED ORDER — CARBAMAZEPINE 200 MG PO TABS
400.0000 mg | ORAL_TABLET | Freq: Every morning | ORAL | Status: DC
  Administered 2016-01-02 – 2016-01-03 (×2): 400 mg via ORAL
  Filled 2016-01-01 (×2): qty 2

## 2016-01-01 MED ORDER — QUETIAPINE FUMARATE 200 MG PO TABS
800.0000 mg | ORAL_TABLET | Freq: Every day | ORAL | Status: DC
  Administered 2016-01-01: 800 mg via ORAL
  Filled 2016-01-01 (×2): qty 4

## 2016-01-01 MED ORDER — ALBIGLUTIDE 50 MG ~~LOC~~ PEN
50.0000 mg | PEN_INJECTOR | SUBCUTANEOUS | Status: DC
  Administered 2016-01-01: 50 mg via SUBCUTANEOUS
  Filled 2016-01-01 (×2): qty 1

## 2016-01-01 MED ORDER — WARFARIN SODIUM 5 MG PO TABS
7.5000 mg | ORAL_TABLET | Freq: Every day | ORAL | Status: DC
  Administered 2016-01-01 – 2016-01-02 (×2): 7.5 mg via ORAL
  Filled 2016-01-01 (×2): qty 2

## 2016-01-01 MED ORDER — SUCCINYLCHOLINE CHLORIDE 20 MG/ML IJ SOLN
INTRAMUSCULAR | Status: DC | PRN
  Administered 2016-01-01: 150 mg via INTRAVENOUS

## 2016-01-01 MED ORDER — VANCOMYCIN HCL IN DEXTROSE 1-5 GM/200ML-% IV SOLN
1000.0000 mg | Freq: Two times a day (BID) | INTRAVENOUS | Status: DC
  Administered 2016-01-02 – 2016-01-03 (×3): 1000 mg via INTRAVENOUS
  Filled 2016-01-01 (×4): qty 200

## 2016-01-01 NOTE — Transfer of Care (Signed)
Immediate Anesthesia Transfer of Care Note  Patient: Cameron [name]  Procedure(s) Performed: * No procedures listed *  Patient Location: PACU  Anesthesia Type:General  Level of Consciousness: sedated  Airway & Oxygen Therapy: Patient Spontanous Breathing and Patient connected to face mask oxygen  Post-op Assessment: Report given to RN and Post -op Vital signs reviewed and stable  Post vital signs: Reviewed and stable  Last Vitals:  Vitals:   01/01/16 1010 01/01/16 1116  BP: (!) 142/86 122/75  Pulse: 99 (!) 110  Resp: 18 (!) 22  Temp: 37.1 C [id number] C    Complications: No apparent anesthesia complications

## 2016-01-01 NOTE — Progress Notes (Signed)
Dr Weber Cooks here.  In to see pt.

## 2016-01-01 NOTE — Progress Notes (Signed)
ANTICOAGULATION CONSULT NOTE - Follow Up Consult  Pharmacy Consult for Warfarin Indication: VTE treatment  Allergies  Allergen Reactions  . Asa [Aspirin] Other (See Comments)    Patient tolerates LOW DOSE ASPIRIN.    Patient Measurements: Height: 6' (182.9 cm) Weight: 300 lb (136.1 kg) IBW/kg (Calculated) : 77.6  Vital Signs: Temp: 99.2 F (37.3 C) (07/31 0726) Temp Source: Oral (07/31 0726) BP: 120/70 (07/31 0726) Pulse Rate: 108 (07/31 0726)  Labs:  Recent Labs  12/30/15 0634 12/31/15 0619 01/01/16 0659  HGB  --   --  11.8*  HCT  --   --  33.3*  PLT  --   --  220  LABPROT 29.6* 33.4* 29.5*  INR 2.74 3.19 2.73   CrCl cannot be calculated (Patient's most recent lab result is older than the maximum 21 days allowed.).  Medical History: Past Medical History:  Diagnosis Date  . Bipolar 1 disorder ([place])   . CKD (chronic kidney disease), stage III   . Diabetes mellitus without complication ([place])   . DVT (deep venous thrombosis) ([place])   . Hypercholesteremia   . Hypertension    Medications:  Warfarin 10 mg po daily  Assessment: 55 yom admitted to ED BHU with manic symptoms. Recently discharged from [facility] with DVT/PE, had been bridging LMWH and VKA prior to admission.  INR History: 7/9: INR - 1.99 7/10: no INR- warfarin 12.5 mg po daily at 0200 7/11: INR - 2.97- warfarin held 7/12: INR - 1.92 - warfarin 10 mg 7/13: INR - 1.95 - warfarin 10 mg 7/14: INR - 2.56 - warfarin 5 mg 7/15: INR - 2.73 - warfarin 5 mg 7/16: INR - 2.39 - warfarin 7.5 mg 7/17: INR - 2.22 - warfarin 7.5 mg 7/18: INR - 2.53 - warfarin 7.5 mg 7/19: INR - 2.75 - warfarin 7.5 mg 7/20: INR - 2.99 - warfarin 6.5 mg 7/21: INR - 2.64 - warfarin 6.5 mg  7/22: INR: 2.44; warfarin 8  7/23: INR: 2.60;  Warfarin 10mg  7/24: INR 2.44 - warfarin 10mg  7/25:  INR 3.22 - warfarin 10mg  7/26:  INR 3.08 - warfarin 8mg  7/27:  INR 3.24 - warfarin 8 mg 7/28:  INR 2.79 - 8 mg 7/29:  INR 2.74- 8 mg 7/30:  INR  3.19 - 7 mg 7/31:  INR 2.73  Goal of Therapy:  INR 2-3 Monitor platelets by anticoagulation protocol: Yes   Plan:  INR has dropped down to within goal range. Will order warfarin 7.5 mg PO daily.  Follow up INR in AM.   (Patient with orders for carbamazepine which interacts with warfarin to decrease the INR.  Will follow INR closely.)   Pt will need CBC q3 days per policy.   Pharmacy will continue to follow.   Rocky Morel, RPh 01/01/2016,8:18 AM

## 2016-01-01 NOTE — H&P (Signed)
Cameron [name] is an 50 y.o. male.   Chief Complaint: Patient continues to have really no specific complaint. He continues to be hyperverbal somewhat agitated disorganized. Leg is swollen and red today and is "stinging" HPI: Patient with bipolar disorder mania gradually improving with ECT and medication  Past Medical History:  Diagnosis Date  . Bipolar 1 disorder ([place])   . CKD (chronic kidney disease), stage III   . Diabetes mellitus without complication ([place])   . DVT (deep venous thrombosis) ([place])   . Hypercholesteremia   . Hypertension     Past Surgical History:  Procedure Laterality Date  . APPENDECTOMY      Family History  Problem Relation Age of Onset  . Stroke Other   . Diabetes Other    Social History:  reports that he has never smoked. He does not have any smokeless tobacco history on file. He reports that he drinks alcohol. He reports that he does not use drugs.  Allergies:  Allergies  Allergen Reactions  . Asa [Aspirin] Other (See Comments)    Patient tolerates LOW DOSE ASPIRIN.    Medications Prior to Admission  Medication Sig Dispense Refill  . ARIPiprazole (ABILIFY) 30 MG tablet Take 30 mg by mouth every evening.     Marland Kitchen atorvastatin (LIPITOR) 40 MG tablet Take 40 mg by mouth daily.  11  . Canagliflozin (INVOKANA) 100 MG TABS Take 100 mg by mouth daily with breakfast.     . carbamazepine (TEGRETOL) 200 MG tablet Take 400-600 mg by mouth 2 (two) times daily. Take 2 tablets in the morning Take 3 tablets in the evening    . enalapril (VASOTEC) 5 MG tablet Take 5 mg by mouth 2 (two) times daily.    Marland Kitchen enoxaparin (LOVENOX) 150 MG/ML injection Inject 1 mL (150 mg total) into the skin every 12 (twelve) hours. 8 Syringe 0  . fenofibrate 160 MG tablet Take 160 mg by mouth daily.    . Insulin Glargine (LANTUS SOLOSTAR) 100 UNIT/ML Solostar Pen Inject 60 Units into the skin daily.     Marland Kitchen lamoTRIgine (LAMICTAL) 100 MG tablet Take 400 mg by mouth at bedtime.    . Multiple  Vitamin (MULTIVITAMIN WITH MINERALS) TABS tablet Take 1 tablet by mouth daily.    . QUEtiapine (SEROQUEL) 400 MG tablet Take 800 mg by mouth at bedtime.    Marland Kitchen TANZEUM 50 MG PEN Inject 50 mg into the skin every 7 (seven) days.  2  . temazepam (RESTORIL) 15 MG capsule Take 15 mg by mouth at bedtime as needed for sleep.    Marland Kitchen warfarin (COUMADIN) 5 MG tablet Take 2 tablets (10 mg total) by mouth daily. 60 tablet 0    Results for orders placed or performed during the hospital encounter of 12/11/15 (from the past 48 hour(s))  Glucose, capillary     Status: Abnormal   Collection Time: 12/30/15 11:51 AM  Result Value Ref Range   Glucose-Capillary 147 (H) 65 - 99 mg/dL   Comment 1 Notify RN   Glucose, capillary     Status: Abnormal   Collection Time: 12/30/15  4:26 PM  Result Value Ref Range   Glucose-Capillary 148 (H) 65 - 99 mg/dL  Glucose, capillary     Status: Abnormal   Collection Time: 12/30/15  9:32 PM  Result Value Ref Range   Glucose-Capillary 124 (H) 65 - 99 mg/dL  Protime-INR     Status: Abnormal   Collection Time: 12/31/15  6:19 AM  Result  Value Ref Range   Prothrombin Time 33.4 (H) 11.4 - 15.2 seconds   INR 3.19   Glucose, capillary     Status: None   Collection Time: 12/31/15  6:21 AM  Result Value Ref Range   Glucose-Capillary 87 65 - 99 mg/dL  Glucose, capillary     Status: Abnormal   Collection Time: 12/31/15 11:50 AM  Result Value Ref Range   Glucose-Capillary 173 (H) 65 - 99 mg/dL  Glucose, capillary     Status: Abnormal   Collection Time: 12/31/15  4:47 PM  Result Value Ref Range   Glucose-Capillary 192 (H) 65 - 99 mg/dL  Glucose, capillary     Status: Abnormal   Collection Time: 12/31/15  9:04 PM  Result Value Ref Range   Glucose-Capillary 128 (H) 65 - 99 mg/dL  Glucose, capillary     Status: Abnormal   Collection Time: 01/01/16  5:59 AM  Result Value Ref Range   Glucose-Capillary 137 (H) 65 - 99 mg/dL  Protime-INR     Status: Abnormal   Collection Time:  01/01/16  6:59 AM  Result Value Ref Range   Prothrombin Time 29.5 (H) 11.4 - 15.2 seconds   INR 2.73   CBC     Status: Abnormal   Collection Time: 01/01/16  6:59 AM  Result Value Ref Range   WBC 5.5 3.8 - 10.6 K/uL   RBC 3.99 (L) 4.40 - 5.90 MIL/uL   Hemoglobin 11.8 (L) 13.0 - 18.0 g/dL   HCT 33.3 (L) 40.0 - 52.0 %   MCV 83.5 80.0 - 100.0 fL   MCH 29.6 26.0 - 34.0 pg   MCHC 35.5 32.0 - 36.0 g/dL   RDW 12.6 11.5 - 14.5 %   Platelets 220 150 - 440 K/uL   No results found.  Review of Systems  Constitutional: Negative.   HENT: Negative.   Eyes: Negative.   Respiratory: Negative.   Cardiovascular: Negative.   Gastrointestinal: Negative.   Musculoskeletal: Negative.   Skin: Positive for rash.  Neurological: Negative.   Psychiatric/Behavioral: Positive for memory loss. Negative for depression, hallucinations, substance abuse and suicidal ideas. The patient has insomnia. The patient is not nervous/anxious.     Blood pressure (!) 142/86, pulse 99, temperature 98.8 F (37.1 C), temperature source Oral, resp. rate 18, height 6' (1.829 m), weight 136.1 kg (300 lb), SpO2 96 %. Physical Exam  Nursing note and vitals reviewed. Constitutional: He appears well-developed and well-nourished.  HENT:  Head: Normocephalic and atraumatic.  Eyes: Conjunctivae are normal. Pupils are equal, round, and reactive to light.  Neck: Normal range of motion.  Cardiovascular: Regular rhythm and normal heart sounds.   Respiratory: Effort normal and breath sounds normal. No respiratory distress.  GI: Soft.  Musculoskeletal: Normal range of motion.  Neurological: He is alert.  Skin: Skin is warm and dry. Rash noted. There is erythema.     Psychiatric: His affect is inappropriate. His speech is tangential. He is hyperactive. He expresses impulsivity. He expresses no suicidal ideation. He exhibits abnormal recent memory.     Assessment/Plan Treatment today. This will be #4. We have made a tentative  agreement on 8 treatments while continuing medication management.  Alethia Berthold, MD 01/01/2016, 10:48 AM

## 2016-01-01 NOTE — Progress Notes (Signed)
Pt presents to ECT with right leg extremely red from knee to ankle.  Skin warm to touch.  Pt states leg feels like it is going to pop and is stinging.  Appears to have rash at top of calf.   Left calf measured 18 1/2 inches and right calf measured 20 1/2 inches.  Dr. Marcello Moores in to see pt and Dr Weber Cooks notified of Pts condition.  Patient's wife Kieth Brightly also notified.  B/P 142/86 P 99  Resp 18 02 sat 97% and temp 98.8.

## 2016-01-01 NOTE — Progress Notes (Signed)
Patient just stated he knew he was NPO but he drank a small cup of water because, "I can choose to do ECT if I want." Patient was told to not drink anything else.

## 2016-01-01 NOTE — Progress Notes (Signed)
CSW called the pt's wife Arvis Warmkessel at [phone] option [phone] ext [phone]

## 2016-01-01 NOTE — Discharge Summary (Signed)
Physician Discharge Summary Note  Patient:  Cameron [name] is an 50 y.o., male MRN:  [id number] DOB:  06-04-1965 Patient phone:  [phone] (home)  Patient address:   [address] [place] [address],  Total Time spent with patient: 30 minutes  Date of Admission:  12/11/2015 Date of Discharge: 01/01/2016  Reason for Admission:  Patient with bipolar disorder admitted to the hospital for acute mania with agitation and psychotic symptoms and poor outpatient functioning  Principal Problem: Bipolar I disorder, current or most recent episode manic, with psychotic features [facility]) Discharge Diagnoses: Patient Active Problem List   Diagnosis Date Noted  . Bipolar I disorder, current or most recent episode manic, with psychotic features ([place]) [F31.2] 12/11/2015  . Pulmonary embolism ([place]) [I26.99] 12/04/2015  . Diabetes mellitus without complication ([place]) [id number]   . Hypercholesteremia [E78.00]   . CKD (chronic kidney disease), stage III [N18.3]   . Acute deep vein thrombosis (DVT) of femoral vein of right lower extremity ([facility]) [I82.411]     Past Psychiatric History: Long-standing history of bipolar disorder multiple episodes of mania as well as depression.  Past Medical History:  Past Medical History:  Diagnosis Date  . Bipolar 1 disorder ([place])   . CKD (chronic kidney disease), stage III   . Diabetes mellitus without complication ([place])   . DVT (deep venous thrombosis) ([place])   . Hypercholesteremia   . Hypertension     Past Surgical History:  Procedure Laterality Date  . APPENDECTOMY     Family History:  Family History  Problem Relation Age of Onset  . Stroke Other   . Diabetes Other    Family Psychiatric  History: Patient has a family history of bipolar disorder Social History:  History  Alcohol Use  . Yes    Comment: rarely     History  Drug Use No    Social History   Social History  . Marital status: Married    Spouse name: N/A  . Number of children: N/A    . Years of education: N/A   Social History Main Topics  . Smoking status: Never Smoker  . Smokeless tobacco: None  . Alcohol use Yes     Comment: rarely  . Drug use: No  . Sexual activity: Not Asked   Other Topics Concern  . None   Social History Narrative  . None    Hospital Course:  Patient has been managed on the psychiatry ward since July 3. He has been receiving medicines for his bipolar disorder and has more recently been receiving ECT. He is gradually showing some improvement but remains at least hypomanic to manic. He is also being treated for a deep venous thrombosis on his right side with accompanying cellulitis. Over the last couple days the redness on his right calf area has gotten much worse. More swollen. He had a slight fever over the weekend. Patient complains of a tingling and burning feeling on that side as well but denies any trouble walking. His anticoagulation levels have been adequate. Patient was seen after ECT today by hospitalist service and the decision is made to transfer him to the medical service for intravenous antibiotics and further evaluation. Patient is aware of the plan and is agreeable. He will be followed on the medicine service by psychiatry and we will take him back to psychiatry as needed. As now had 4 ECT treatments as of discharge which are being tolerated well and showing good benefit to his mood  Physical Findings:  AIMS:  , ,  ,  ,    CIWA:    COWS:     Musculoskeletal: Strength & Muscle Tone: within normal limits Gait & Station: normal Patient leans: N/A  Psychiatric Specialty Exam: Physical Exam  Nursing note and vitals reviewed. Constitutional: He appears well-developed and well-nourished.  HENT:  Head: Normocephalic and atraumatic.  Eyes: Conjunctivae are normal. Pupils are equal, round, and reactive to light.  Neck: Normal range of motion.  Cardiovascular: Regular rhythm and normal heart sounds.   Respiratory: Effort normal and  breath sounds normal. No respiratory distress.  GI: Soft.  Musculoskeletal: Normal range of motion.  Neurological: He is alert.  Skin: Skin is warm and dry. Rash noted. There is erythema.     Psychiatric: His affect is labile. His speech is tangential. Thought content is not paranoid. Cognition and memory are impaired. He expresses impulsivity. He expresses no suicidal ideation. He is inattentive.    Review of Systems  Constitutional: Negative.   HENT: Negative.   Eyes: Negative.   Respiratory: Negative.   Cardiovascular: Negative.   Gastrointestinal: Negative.   Musculoskeletal: Negative.   Skin: Positive for rash.  Neurological: Negative.   Psychiatric/Behavioral: Positive for memory loss. Negative for depression, hallucinations, substance abuse and suicidal ideas. The patient has insomnia. The patient is not nervous/anxious.     Blood pressure 115/79, pulse (!) 106, temperature 97.6 F (36.4 C), resp. rate 20, height 6' (1.829 m), weight 136.1 kg (300 lb), SpO2 97 %.Body mass index is 40.69 kg/m.  General Appearance: Disheveled  Eye Contact:  Good  Speech:  Garbled  Volume:  Increased  Mood:  Euphoric  Affect:  Labile  Thought Process:  Disorganized and Goal Directed  Orientation:  Full (Time, Place, and Person)  Thought Content:  Rumination and Tangential  Suicidal Thoughts:  No  Homicidal Thoughts:  No  Memory:  Immediate;   Good Recent;   Fair Remote;   Fair  Judgement:  Fair  Insight:  Fair  Psychomotor Activity:  Increased  Concentration:  Concentration: Fair  Recall:  [place] of Knowledge:  Fair  Language:  Fair  Akathisia:  No  Handed:  Right  AIMS (if indicated):     Assets:  Communication Skills Desire for Improvement Financial Resources/Insurance Housing Resilience Social Support  ADL's:  Intact  Cognition:  WNL  Sleep:  Number of Hours: 4        Has this patient used any form of tobacco in the last 30 days? (Cigarettes, Smokeless Tobacco,  Cigars, and/or Pipes) Yes, No  Blood Alcohol level:  Lab Results  Component Value Date   ETH <5 [id number]    Metabolic Disorder Labs:  Lab Results  Component Value Date   HGBA1C 8.3 (H) 12/04/2015   MPG 192 12/04/2015   MPG 318 (H) 08/24/2010   Lab Results  Component Value Date   PROLACTIN 4.4 12/11/2015   Lab Results  Component Value Date   CHOL 143 12/11/2015   TRIG 286 (H) 12/11/2015   HDL 32 (L) 12/11/2015   CHOLHDL 4.5 12/11/2015   VLDL 57 (H) 12/11/2015   LDLCALC 54 12/11/2015   LDLCALC 56 12/05/2015    See Psychiatric Specialty Exam and Suicide Risk Assessment completed by Attending Physician prior to discharge.  Discharge destination:  Other:  Patient is being transferred to the medical service per the hospitalist and will be followed up there by psychiatry  Is patient on multiple antipsychotic therapies at discharge:  No  Has Patient had three or more failed trials of antipsychotic monotherapy by history:  No  Recommended Plan for Multiple Antipsychotic Therapies: NA     Medication List    ASK your doctor about these medications     Indication  ARIPiprazole 30 MG tablet Commonly known as:  ABILIFY Take 30 mg by mouth every evening.    atorvastatin 40 MG tablet Commonly known as:  LIPITOR Take 40 mg by mouth daily.    carbamazepine 200 MG tablet Commonly known as:  TEGRETOL Take 400-600 mg by mouth 2 (two) times daily. Take 2 tablets in the morning Take 3 tablets in the evening    enalapril 5 MG tablet Commonly known as:  VASOTEC Take 5 mg by mouth 2 (two) times daily.    enoxaparin 150 MG/ML injection Commonly known as:  LOVENOX Inject 1 mL (150 mg total) into the skin every 12 (twelve) hours.    fenofibrate 160 MG tablet Take 160 mg by mouth daily.    INVOKANA 100 MG Tabs tablet Generic drug:  canagliflozin Take 100 mg by mouth daily with breakfast.    lamoTRIgine 100 MG tablet Commonly known as:  LAMICTAL Take 400 mg by mouth at  bedtime.    LANTUS SOLOSTAR 100 UNIT/ML Solostar Pen Generic drug:  Insulin Glargine Inject 60 Units into the skin daily.    multivitamin with minerals Tabs tablet Take 1 tablet by mouth daily.    QUEtiapine 400 MG tablet Commonly known as:  SEROQUEL Take 800 mg by mouth at bedtime.    TANZEUM 50 MG Pen Generic drug:  Albiglutide Inject 50 mg into the skin every 7 (seven) days.    temazepam 15 MG capsule Commonly known as:  RESTORIL Take 15 mg by mouth at bedtime as needed for sleep.    warfarin 5 MG tablet Commonly known as:  COUMADIN Take 2 tablets (10 mg total) by mouth daily.       Follow-up Information    Go to [facility] Psychiatry.   Why:  Please arrive on Friday July 21st at 4pm for your hospital follow up with Dr. Thurmond Butts for medication managment. Contact information: Doctor Lew Dawes MD [place], [place] [address] Ph: [phone] Fax: [phone] Call before faxing due to one line only        [facility]. Go on 01/09/2016.   Why:  4:00pm , Hospital Follow up, Therapy, Please reschedule if unable to make appointment. Contact information: Mobile  [address] [place], [place] [address] Ph: [phone] Fax: [phone]             Follow-up recommendations:  Activity:  Activity per hospitalist on medical service Diet:  Low carbohydrate diet Other:  He will be followed up by psychiatry and will continue his ECT course.  Comments:  Patient is being transferred to the medical service. Psychiatry will follow up.  Signed: Alethia Berthold, MD 01/01/2016, 12:03 PM

## 2016-01-01 NOTE — Progress Notes (Signed)
  [facility] Adult Case Management Discharge Plan :  Will you be returning to the same living situation after discharge:  No. Pt will discharge to the medical floor to receive IV administration of antibiotics  At discharge, do you have transportation home?: Yes,  pt will be transported to room [facility] by [facility] personnel Do you have the ability to pay for your medications: Yes,  pt will be provided with prescriptions at discharge  Release of information consent forms completed and in the chart;  Patient's signature needed at discharge.  Patient to Follow up at: Follow-up Information    Go today [facility] Psychiat[facility].   Why:  Please arrive on Wednesday August 16th at 4:30pm for your hospital follow up with Dr. Thurmond Butts for medication managment. Contact information: Doctor Lew Dawes MD [place], [place] [address] Ph: [phone] Fax: [phone] Call before faxing due to one line only        [facility]. Go on 01/09/2016.   Why:  Please arrive on Tuesday August 8th 4:00pm for your hospital follow up for therapy, Please reschedule if unable to make appointment. Contact information: [place]  [place], [place] [address] Ph: [phone] Fax: [phone]             Next level of care provider has access to [place] and Suicide Prevention discussed: Yes,  completed with pt     Has patient been referred to the Quitline?: N/A patient is not a smoker  Patient has been referred for addiction treatment: N/A  Cameron [name] Cameron [name] 01/01/2016, 1:13 PM

## 2016-01-01 NOTE — Care Management Note (Signed)
Case Management Note  Patient Details  Name: LAWERENCE PRUDEN MRN: [id number] Date of Birth: December 09, 1965  Subjective/Objective:                 Patient transferred from [facility] for evaluation/ treatment of cellulitis in setting DVT history.  [facility] admission 7-10 to 7-31, patient voluntarily checked himself in after family encouraged. Hx bipolar. Patient dx with DVT admitted to [facility] 7-2 to 7-6. Benefit check at that time listed enoxparin at $35.00. Coumadin listed on PTA med list.    Action/Plan:  CM will continue to follow.   Expected Discharge Date:                  Expected Discharge Plan:   (to be determined)  In-House Referral:  Clinical Social Work  Discharge planning Services  CM Consult  Post Acute Care Choice:    Choice offered to:     DME Arranged:    DME Agency:     HH Arranged:    [place] Agency:     Status of Service:  In process, will continue to follow  If discussed at Long Length of Stay Meetings, dates discussed:    Additional Comments:  Carles Collet, RN 01/01/2016, 2:19 PM

## 2016-01-01 NOTE — Progress Notes (Signed)
ANTIBIOTIC CONSULT NOTE - INITIAL  Pharmacy Consult for Vancomycin  Indication: cellulitis  Allergies  Allergen Reactions  . Asa [Aspirin] Other (See Comments)    Patient tolerates LOW DOSE ASPIRIN.    Patient Measurements: Height: 6' (182.9 cm) Weight: 299 lb (135.6 kg) IBW/kg (Calculated) : 77.6 Adjusted Body Weight: 100.8 kg   Vital Signs: Temp: 98.1 F (36.7 C) (07/31 1352) Temp Source: Oral (07/31 1352) BP: 108/63 (07/31 1352) Pulse Rate: 98 (07/31 1352) Intake/Output from previous day: No intake/output data recorded. Intake/Output from this shift: No intake/output data recorded.  Labs:  Recent Labs  01/01/16 0659 01/01/16 1545 01/01/16 2025  WBC 5.5 4.9  --   HGB 11.8* 11.7*  --   PLT 220 238  --   CREATININE  --   --  1.73*   Estimated Creatinine Clearance: 72 mL/min (by C-G formula based on SCr of 1.73 mg/dL). No results for input(s): VANCOTROUGH, VANCOPEAK, VANCORANDOM, GENTTROUGH, GENTPEAK, GENTRANDOM, TOBRATROUGH, TOBRAPEAK, TOBRARND, AMIKACINPEAK, AMIKACINTROU, AMIKACIN in the last 72 hours.   Microbiology: Recent Results (from the past 720 hour(s))  MRSA PCR Screening     Status: None   Collection Time: 12/04/15  5:40 AM  Result Value Ref Range Status   MRSA by PCR NEGATIVE NEGATIVE Final    Comment:        The GeneXpert MRSA Assay (FDA approved for NASAL specimens only), is one component of a comprehensive MRSA colonization surveillance program. It is not intended to diagnose MRSA infection nor to guide or monitor treatment for MRSA infections.   CULTURE, BLOOD (ROUTINE X 2) w Reflex to ID Panel     Status: None (Preliminary result)   Collection Time: 01/01/16 10:13 AM  Result Value Ref Range Status   Specimen Description BLOOD LEFT ASSIST CONTROL  Final   Special Requests   Final    BOTTLES DRAWN AEROBIC AND ANAEROBIC  AEROBIC [place], ANAEROBIC 3CC   Culture NO GROWTH < 12 HOURS  Final   Report Status PENDING  Incomplete  CULTURE, BLOOD  (ROUTINE X 2) w Reflex to ID Panel     Status: None (Preliminary result)   Collection Time: 01/01/16 10:19 AM  Result Value Ref Range Status   Specimen Description BLOOD RIGHT ASSIST CONTROL  Final   Special Requests   Final    BOTTLES DRAWN AEROBIC AND ANAEROBIC  AEROBIC 4CC, ANAEROBIC [place]   Culture NO GROWTH < 12 HOURS  Final   Report Status PENDING  Incomplete    Medical History: Past Medical History:  Diagnosis Date  . Bipolar 1 disorder ([place])   . CKD (chronic kidney disease), stage III   . Diabetes mellitus without complication ([place])   . DVT (deep venous thrombosis) ([place])   . Hypercholesteremia   . Hypertension     Medications:  Prescriptions Prior to Admission  Medication Sig Dispense Refill Last Dose  . ARIPiprazole (ABILIFY) 30 MG tablet Take 30 mg by mouth every evening.    12/24/2015  . atorvastatin (LIPITOR) 40 MG tablet Take 40 mg by mouth daily.  11 12/24/2015  . Canagliflozin (INVOKANA) 100 MG TABS Take 100 mg by mouth daily with breakfast.    12/24/2015  . carbamazepine (TEGRETOL) 200 MG tablet Take 400-600 mg by mouth 2 (two) times daily. Take 2 tablets in the morning Take 3 tablets in the evening   12/24/2015  . enalapril (VASOTEC) 5 MG tablet Take 5 mg by mouth 2 (two) times daily.   12/25/2015 at 0627  .  enoxaparin (LOVENOX) 150 MG/ML injection Inject 1 mL (150 mg total) into the skin every 12 (twelve) hours. 8 Syringe 0 12/24/2015  . fenofibrate 160 MG tablet Take 160 mg by mouth daily.   12/24/2015  . Insulin Glargine (LANTUS SOLOSTAR) 100 UNIT/ML Solostar Pen Inject 60 Units into the skin daily.    12/24/2015  . lamoTRIgine (LAMICTAL) 100 MG tablet Take 400 mg by mouth at bedtime.   12/24/2015  . Multiple Vitamin (MULTIVITAMIN WITH MINERALS) TABS tablet Take 1 tablet by mouth daily.   12/24/2015  . QUEtiapine (SEROQUEL) 400 MG tablet Take 800 mg by mouth at bedtime.   12/24/2015  . TANZEUM 50 MG PEN Inject 50 mg into the skin every 7 (seven) days.  2 12/24/2015  .  temazepam (RESTORIL) 15 MG capsule Take 15 mg by mouth at bedtime as needed for sleep.   12/24/2015  . warfarin (COUMADIN) 5 MG tablet Take 2 tablets (10 mg total) by mouth daily. 60 tablet 0 12/24/2015   Assessment: CrCl = 72 ml/min Ke = 0.064 hr-1 T1/2 = 10.8 hrs Vd = 70.6 L   Goal of Therapy:  Vancomycin trough level 10-15 mcg/ml  Plan:  Expected duration 7 days with resolution of temperature and/or normalization of WBC   Vancomycin 1 gm IV X 1 given on 7/31 @ 17:00. Vancomycin 1 gm IV Q12H ordered to start 7/31 @ 23:00, ~ 6 hrs after 1st dose (stacked dosing). This pt will reach [facility] by 8/2 @ 17:00. Will draw 1st trough on 8/2 @ 22:30, which will be at [facility].   Sande Pickert D 01/01/2016,9:17 PM

## 2016-01-01 NOTE — Progress Notes (Signed)
Report given to [place] RN on 2C, pt transported via orderly in wheelchair, sent belongings from locker and room with patient, sent cream from med drawer with orderly, Pt has valuables locked with security and medications stored in pharmacy--those paper forms were sent with orderly upon transfer to [place], to be placed in patient chart on 2C, notified patients wife of his room change.

## 2016-01-01 NOTE — Progress Notes (Signed)
Patient ID: Cameron [name], male   DOB: 04/16/1966, 50 y.o.   MRN: [id number] CSW contacted the pt's wife Jabraylen Krampitz by phone at [phone] Option 4 ext [phone] to inform her pt will be transferred to room 224 on the medical floor to have antibiotics administered via IV.   [name]. Efrata Brunner, LCSWA, LCAS   01/01/16

## 2016-01-01 NOTE — Tx Team (Signed)
Interdisciplinary Treatment Plan Update (Adult)        Date: 01/01/2016   Time Reviewed: 9:30 AM   Progress in Treatment: Improving  Attending groups: Yes Participating in groups: Yes Taking medication as prescribed: Yes  Tolerating medication: Yes  Family/Significant other contact made:Yes, CSW spoke to the pt's wife Day Deery at ph: [phone] option 4, Ext [phone] Patient understands diagnosis: Yes  Discussing patient identified problems/goals with staff: Yes  Medical problems stabilized or resolved: Yes  Denies suicidal/homicidal ideation: Yes  Issues/concerns per patient self-inventory: Yes  Other:   New problem(s) identified: N/A   Discharge Plan or Barriers: Pt is discharging to room 224 on the medical floor at [facility] to receive antibiotics via IVC for a wound on his leg and will continue ECT treatment there.  Pt will later discharge home to [facility] to live with his family and will follow up with [facility] for medication management and with [facility] for therapy   Reason for Continuation of Hospitalization:   Depression   Anxiety   Medication Stabilization   Comments: N/A   Estimated length of stay: 3-5 days     Patient is a 50 year old male with a history of bipolar disorder.  Pt's chief complaint. "I committed myself."   History of present illness. Information was obtained from the patient and the chart. The patient has a long history of bipolar Disorder with multiple psychiatric hospitalizations he has been stable since 2009 on a combination of Abilify, Seroquel, Tegretol, Lamictal, and Restoril in the care of dr. [facility], his primary psychiatrist. The patient has been compliant with medications and doctors appointments. On July 2 he was admitted to medical floor for DVT and pulmonary embolism. At that time some of his medications were discontinued or the doses were lowered. The patient started developing manic symptoms. He became insomniac,  hyperactive, hyperverbal and possibly psychotic. He called exterminators to his house twice to deal with critters that he could see on the walls. The wife suggested that he comes to the hospital and the patient agreed. He is very proud of himself that he committed himself. He is very proud of the fact the years he developed good coping skills. This includes good treatment compliance as well as ability to listen and not to interrupt. He denies symptoms of anxiety. There are no alcohol or drugs involved.   Past psychiatric history. Long history of bipolar mania. He used to be hospitalized every 2 years for severe manic episode that it will require extended hospitalizations and ECT treatments. His last episode was in 2009. She received ECT treatment according to the patient flipped him to depression.    Family psychiatric history. Daughter with bipolar and ADHD.   Social history. He experienced several stressors in the pas ttwo years, sickness of his mother and job loss, that did not result in exacerbation of his illness. Recent hospital admission were some omissions in his medication regimen were made brought him to the hospital again. 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 . Patient lives in [place]. Patient will benefit from crisis stabilization, medication evaluation, group therapy, and psycho education in addition to case management for discharge planning. Patient and CSW reviewed pt's identified goals and treatment plan. Pt verbalized understanding and agreed to treatment plan.    Review of initial/current patient goals per problem list:  1. Goal(s): Patient will participate in aftercare plan   Met: Yes Target date: 3-5 days post admission date  As evidenced by: Patient will participate within aftercare plan AEB aftercare provider and housing plan at discharge being identified.   7/12: CSW assessing for appropriate contacts  7/18: Pt will discharge home to  [facility] to live with his family and will follow up with [facility] for medication management and with [facility] for therapy  7/31: Adequate for discharge per MD.   2. . Goal(s): Patient will demonstrate decreased signs of psychosis  * Met: Adequate for discharge per MD. * Target date: 3-5 days post admission date  * As evidenced by: Patient will demonstrate decreased frequency of AVH or return to baseline function   7/12: Goal progressing.  7/18: Goal progressing.  7/20: Goal progressing.  7/26: Goal progressing.  7/31: Adequate for discharge per MD.   3. Goal (s): Patient will demonstrate decreased signs of mania  * Met: Adequate for discharge per MD. * Target date: 3-5 days post admission date  * As evidenced by: Patient demonstrate decreased signs of mania AEB decreased mood instability and demonstration of stable mood   7/12: Goal progressing.  7/18: Goal progressing.  7/20: Goal progressing.  7/26: Goal progressing.  7/31: Adequate for discharge per MD.  Attendees:  Patient: Family:  Physician: Dr. Algie Coffer, MD    01/01/2016 9:30 AM  Nursing: Terressa Koyanagi, RN   01/01/2016 3PM  Clinical Social Worker: Marylou Flesher, [place]  01/01/2016 3PM  Clinical Social Worker:   , Piedra Aguza   01/01/2016 3PM  Other: , NP       01/01/2016 AM  Other:        7/31/2017AM  Other:        01/01/2016  AM   [name]. Lateisha Thurlow, LCSWA, LCAS  01/01/16

## 2016-01-01 NOTE — Progress Notes (Signed)
IV started and lab here to draw blood for blood cultures.

## 2016-01-01 NOTE — Anesthesia Preprocedure Evaluation (Signed)
Anesthesia Evaluation  Patient identified by MRN, date of birth, ID band Patient awake    Reviewed: Allergy & Precautions, NPO status , Patient's Chart, lab work & pertinent test results, reviewed documented beta blocker date and time   Airway Mallampati: III  TM Distance: >3 FB     Dental  (+) Chipped   Pulmonary           Cardiovascular hypertension, Pt. on medications + Peripheral Vascular Disease       Neuro/Psych PSYCHIATRIC DISORDERS Bipolar Disorder    GI/Hepatic   Endo/Other  diabetes  Renal/GU Renal disease     Musculoskeletal   Abdominal   Peds  Hematology   Anesthesia Other Findings   Reproductive/Obstetrics                             Anesthesia Physical Anesthesia Plan  ASA: III  Anesthesia Plan: General   Post-op Pain Management:    Induction: Intravenous  Airway Management Planned: Mask  Additional Equipment:   Intra-op Plan:   Post-operative Plan:   Informed Consent: I have reviewed the patients History and Physical, chart, labs and discussed the procedure including the risks, benefits and alternatives for the proposed anesthesia with the patient or authorized representative who has indicated his/her understanding and acceptance.     Plan Discussed with: CRNA  Anesthesia Plan Comments:         Anesthesia Quick Evaluation

## 2016-01-01 NOTE — Anesthesia Postprocedure Evaluation (Signed)
Anesthesia Post Note  Patient: Cameron [name]  Procedure([name]) Performed: * No procedures listed *  Patient location during evaluation: PACU Anesthesia Type: General Level of consciousness: awake and alert Pain management: pain level controlled Vital Signs Assessment: post-procedure vital signs reviewed and stable Respiratory status: spontaneous breathing, nonlabored ventilation, respiratory function stable and patient connected to nasal cannula oxygen Cardiovascular status: blood pressure returned to baseline and stable Postop Assessment: no signs of nausea or vomiting Anesthetic complications: no    Last Vitals:  Vitals:   01/01/16 1126 01/01/16 1213  BP: 115/79 121/64  Pulse: (!) 106 100  Resp: 20 20  Temp:  37.1 C    Last Pain:  Vitals:   01/01/16 1213  TempSrc: Oral  PainSc: 0-No pain                 Cameron [name]

## 2016-01-01 NOTE — BHH Suicide Risk Assessment (Signed)
[facility] Discharge Suicide Risk Assessment   Principal Problem: Bipolar I disorder, current or most recent episode manic, with psychotic features [facility]) Discharge Diagnoses:  Patient Active Problem List   Diagnosis Date Noted  . Bipolar I disorder, current or most recent episode manic, with psychotic features ([place]) [F31.2] 12/11/2015  . Pulmonary embolism ([place]) [I26.99] 12/04/2015  . Diabetes mellitus without complication ([place]) A999333   . Hypercholesteremia [E78.00]   . CKD (chronic kidney disease), stage III [N18.3]   . Acute deep vein thrombosis (DVT) of femoral vein of right lower extremity (HCC) [I82.411]     Total Time spent with patient: 30 minutes  Musculoskeletal: Strength & Muscle Tone: within normal limits Gait & Station: normal Patient leans: N/A  Psychiatric Specialty Exam: ROS  Blood pressure 115/79, pulse (!) 106, temperature 97.6 F (36.4 C), resp. rate 20, height 6' (1.829 m), weight 136.1 kg (300 lb), SpO2 97 %.Body mass index is 40.69 kg/m.  General Appearance: Disheveled  Eye Contact::  Fair  Speech:  [phone]  Volume:  Increased  Mood:  Euphoric  Affect:  Labile  Thought Process:  Disorganized  Orientation:  Full (Time, Place, and Person)  Thought Content:  Rumination  Suicidal Thoughts:  No  Homicidal Thoughts:  No  Memory:  Immediate;   Good Recent;   Fair Remote;   Fair  Judgement:  Fair  Insight:  Fair  Psychomotor Activity:  Increased  Concentration:  Fair  Recall:  [employer] of Knowledge:Fair  Language: Fair  Akathisia:  No  Handed:  Right  AIMS (if indicated):     Assets:  Communication Skills Desire for Improvement Financial Resources/Insurance Housing Resilience Social Support  Sleep:  Number of Hours: 4  Cognition: WNL  ADL's:  Intact   Mental Status Per Nursing Assessment::   On Admission:     Demographic Factors:  Caucasian  Loss Factors: NA  Historical Factors: Impulsivity  Risk Reduction Factors:   Sense of  responsibility to family, Religious beliefs about death and Positive therapeutic relationship  Continued Clinical Symptoms:  Bipolar Disorder:   Bipolar II  Cognitive Features That Contribute To Risk:  Loss of executive function    Suicide Risk:  Minimal: No identifiable suicidal ideation.  Patients presenting with no risk factors but with morbid ruminations; may be classified as minimal risk based on the severity of the depressive symptoms  Follow-up Information    Go to [facility].   Why:  Please arrive on Friday July 21st at 4pm for your hospital follow up with Dr. [facility] for medication managment. Contact information: Doctor Lew Dawes MD [place], [place] [address] Ph: [phone] Fax: [phone] Call before faxing due to one line only        [facility]. Go on 01/09/2016.   Why:  4:00pm , Hospital Follow up, Therapy, Please reschedule if unable to make appointment. Contact information: [place]  [address] [place], [place] [address] Ph: [phone] Fax: [phone]             Plan Of Care/Follow-up recommendations:  Activity:  Activity will be per the orders of the medicine service for now Diet:  Carbohydrate controlled diet Other:  Patient is being transferred to the medical service for treatment of cellulitis  Alethia Berthold, MD 01/01/2016, 12:00 PM

## 2016-01-01 NOTE — Progress Notes (Signed)
D: Patient still very tangential but appears less disorganized and intrusive. Patient states he's going to do ECT tomorrow. He denies SI/HI/AVH. He reports having a HA. A: Medication was given with education. Encouragement was provided. Patient was educated to remain NPO after midnight.  R: Patient was compliant with medication. He has remained calm and cooperative. Safety maintained with 15 min checks.

## 2016-01-01 NOTE — Consult Note (Signed)
[facility] Consult   Reason for Consult:  Follow-up consult for 50 year old man with bipolar disorder and cellulitis Referring Physician:  Gouru Patient Identification: Cameron [name] MRN:  [id number] Principal Diagnosis: Bipolar I disorder, current or most recent episode manic, with psychotic features [facility]) Diagnosis:   Patient Active Problem List   Diagnosis Date Noted  . Cellulitis of leg, right [L03.115] 01/01/2016  . Bipolar I disorder, current or most recent episode manic, with psychotic features ([place]) [F31.2] 12/11/2015  . Pulmonary embolism ([place]) [I26.99] 12/04/2015  . Diabetes mellitus without complication ([place]) [id number]   . Hypercholesteremia [E78.00]   . CKD (chronic kidney disease), stage III [N18.3]   . Acute deep vein thrombosis (DVT) of femoral vein of right lower extremity (HCC) [I82.411]     Total Time spent with patient: 45 minutes  Subjective:   Cameron [name] is a 50 y.o. male patient admitted with "I'm feeling great".  HPI:  Follow-up for this 50 year old man who had been on the psychiatric ward until this morning. He had cellulitis which appeared to be getting worse and has been transferred to the medical service. Patient interviewed and chart reviewed. Patient was cooperative and pleasant. He says he is feeling great. Denies any acute pain. Denies feeling sick. His thoughts are still disorganized tangential with lots of flight of ideas but there is no threatening behavior no psychosis and he is basically cooperative with treatment.  Past Psychiatric History: Long history of bipolar disorder current episode manic has been going on for over a month. Slowly responding to ECT along with mood stabilizing medicine no history of suicide attempts  Risk to Self: Is patient at risk for suicide?: No Risk to Others:   Prior Inpatient Therapy:   Prior Outpatient Therapy:    Past Medical History:  Past Medical History:  Diagnosis Date  . Bipolar 1  disorder ([place])   . CKD (chronic kidney disease), stage III   . Diabetes mellitus without complication ([place])   . DVT (deep venous thrombosis) ([place])   . Hypercholesteremia   . Hypertension     Past Surgical History:  Procedure Laterality Date  . APPENDECTOMY     Family History:  Family History  Problem Relation Age of Onset  . Stroke Other   . Diabetes Other    Family Psychiatric  History: Positive for bipolar disorder Social History:  History  Alcohol Use  . Yes    Comment: rarely     History  Drug Use No    Social History   Social History  . Marital status: Married    Spouse name: N/A  . Number of children: N/A  . Years of education: N/A   Social History Main Topics  . Smoking status: Never Smoker  . Smokeless tobacco: None  . Alcohol use Yes     Comment: rarely  . Drug use: No  . Sexual activity: Not Asked   Other Topics Concern  . None   Social History Narrative  . None   Additional Social History:    Allergies:   Allergies  Allergen Reactions  . Asa [Aspirin] Other (See Comments)    Patient tolerates LOW DOSE ASPIRIN.    Labs:  Results for orders placed or performed during the hospital encounter of 01/01/16 (from the past 48 hour(s))  CBC WITH DIFFERENTIAL     Status: Abnormal   Collection Time: 01/01/16  3:45 PM  Result Value Ref Range   WBC 4.9 3.8 - 10.6 K/uL  RBC 3.93 (L) 4.40 - 5.90 MIL/uL   Hemoglobin 11.7 (L) 13.0 - 18.0 g/dL   HCT 33.1 (L) 40.0 - 52.0 %   MCV 84.1 80.0 - 100.0 fL   MCH 29.6 26.0 - 34.0 pg   MCHC 35.3 32.0 - 36.0 g/dL   RDW 12.5 11.5 - 14.5 %   Platelets 238 150 - 440 K/uL   Neutrophils Relative % 71 %   Neutro Abs 3.4 1.4 - 6.5 K/uL   Lymphocytes Relative 16 %   Lymphs Abs 0.8 (L) 1.0 - 3.6 K/uL   Monocytes Relative 13 %   Monocytes Absolute 0.6 0.2 - 1.0 K/uL   Eosinophils Relative 0 %   Eosinophils Absolute 0.0 0 - 0.7 K/uL   Basophils Relative 0 %   Basophils Absolute 0.0 0 - 0.1 K/uL  Glucose,  capillary     Status: Abnormal   Collection Time: 01/01/16  4:50 PM  Result Value Ref Range   Glucose-Capillary 117 (H) 65 - 99 mg/dL    Current Facility-Administered Medications  Medication Dose Route Frequency Provider Last Rate Last Dose  . Albiglutide PEN 50 mg  50 mg Subcutaneous Q7 days Nicholes Mango, MD   50 mg at 01/01/16 1833  . ARIPiprazole (ABILIFY) tablet 30 mg  30 mg Oral QPM Nicholes Mango, MD   30 mg at 01/01/16 1709  . atorvastatin (LIPITOR) tablet 40 mg  40 mg Oral Daily Nicholes Mango, MD   40 mg at 01/01/16 1708  . [START ON 01/02/2016] canagliflozin (INVOKANA) tablet 100 mg  100 mg Oral Q breakfast Nicholes Mango, MD      . Derrill Memo ON 01/02/2016] carbamazepine (TEGRETOL) tablet 400 mg  400 mg Oral q morning - 10a Aruna Gouru, MD      . carbamazepine (TEGRETOL) tablet 600 mg  600 mg Oral QPM Nicholes Mango, MD   600 mg at 01/01/16 1709  . docusate sodium (COLACE) capsule 100 mg  100 mg Oral BID Nicholes Mango, MD   100 mg at 01/01/16 1709  . enalapril (VASOTEC) tablet 5 mg  5 mg Oral BID Nicholes Mango, MD   5 mg at 01/01/16 1709  . fenofibrate tablet 160 mg  160 mg Oral Daily Nicholes Mango, MD   160 mg at 01/01/16 1708  . insulin aspart (novoLOG) injection 0-5 Units  0-5 Units Subcutaneous QHS Aruna Gouru, MD      . insulin aspart (novoLOG) injection 0-9 Units  0-9 Units Subcutaneous TID WC Aruna Gouru, MD      . insulin glargine (LANTUS) injection 60 Units  60 Units Subcutaneous Daily Nicholes Mango, MD   60 Units at 01/01/16 1830  . lamoTRIgine (LAMICTAL) tablet 400 mg  400 mg Oral QHS Nicholes Mango, MD      . multivitamin with minerals tablet 1 tablet  1 tablet Oral Daily Nicholes Mango, MD   1 tablet at 01/01/16 1708  . ondansetron (ZOFRAN) tablet 4 mg  4 mg Oral Q6H PRN Nicholes Mango, MD       Or  . ondansetron (ZOFRAN) injection 4 mg  4 mg Intravenous Q6H PRN Nicholes Mango, MD      . QUEtiapine (SEROQUEL) tablet 800 mg  800 mg Oral QHS Aruna Gouru, MD      . sodium chloride flush (NS) 0.9 % injection 3  mL  3 mL Intravenous Q12H Aruna Gouru, MD      . temazepam (RESTORIL) capsule 15 mg  15 mg Oral QHS PRN Nicholes Mango, MD      .  warfarin (COUMADIN) tablet 7.5 mg  7.5 mg Oral Daily Nicholes Mango, MD   7.5 mg at 01/01/16 1709  . Warfarin - Pharmacist Dosing Inpatient   Does not apply [id number] Nicholes Mango, MD   7.5 each at 01/01/16 1800    Musculoskeletal: Strength & Muscle Tone: within normal limits Gait & Station: normal Patient leans: N/A  Psychiatric Specialty Exam: Physical Exam  Nursing note and vitals reviewed. Constitutional: He appears well-developed and well-nourished.  HENT:  Head: Normocephalic and atraumatic.  Eyes: Conjunctivae are normal. Pupils are equal, round, and reactive to light.  Neck: Normal range of motion.  Cardiovascular: Normal heart sounds.   Respiratory: Effort normal.  GI: Soft.  Musculoskeletal: Normal range of motion.  Neurological: He is alert.  Skin: Skin is warm and dry.     Psychiatric: His affect is labile. His speech is tangential. He is hyperactive. Thought content is not paranoid. Cognition and memory are normal. He expresses impulsivity. He expresses no suicidal ideation.    Review of Systems  Constitutional: Negative.   HENT: Negative.   Eyes: Negative.   Respiratory: Negative.   Cardiovascular: Negative.   Gastrointestinal: Negative.   Musculoskeletal: Negative.   Skin: Negative.   Neurological: Negative.   Psychiatric/Behavioral: Negative for depression, hallucinations, memory loss, substance abuse and suicidal ideas. The patient has insomnia. The patient is not nervous/anxious.     Height 6' (1.829 m), weight 135.6 kg (299 lb).Body mass index is 40.55 kg/m.  General Appearance: Casual  Eye Contact:  Good  Speech:  Pressured  Volume:  Increased  Mood:  Euphoric  Affect:  Labile  Thought Process:  Disorganized  Orientation:  Full (Time, Place, and Person)  Thought Content:  Tangential  Suicidal Thoughts:  No  Homicidal Thoughts:   No  Memory:  Immediate;   Fair Recent;   Fair Remote;   Fair  Judgement:  Impaired  Insight:  Shallow  Psychomotor Activity:  Increased and Restlessness  Concentration:  Concentration: Fair  Recall:  [employer] of Knowledge:  Fair  Language:  Fair  Akathisia:  No  Handed:  Right  AIMS (if indicated):     Assets:  Communication Skills Desire for Improvement Financial Resources/Insurance Housing Resilience Social Support  ADL's:  Intact  Cognition:  WNL  Sleep:        Treatment Plan Summary: Daily contact with patient to assess and evaluate symptoms and progress in treatment, Medication management and Plan Patient has been continued on his appropriate psychiatric medicine including Tegretol lamotrigine and quetiapine and aripiprazole. No indication for me to change anything else at this time. ECT will continue. He has had 4 treatments in the next will be Wednesday morning. We can take him back to the psychiatry service as soon he is medically stable and no longer needs intravenous therapy.  Disposition: Recommend psychiatric Inpatient admission when medically cleared. Supportive therapy provided about ongoing stressors.  Alethia Berthold, MD 01/01/2016 7:52 PM

## 2016-01-01 NOTE — Anesthesia Procedure Notes (Signed)
Date/Time: 01/01/2016 10:55 AM Performed by: Doreen Salvage Pre-anesthesia Checklist: Patient identified, Emergency Drugs available, Suction available and Patient being monitored Patient Re-evaluated:Patient Re-evaluated prior to inductionOxygen Delivery Method: Circle system utilized Preoxygenation: Pre-oxygenation with 100% oxygen Intubation Type: IV induction Ventilation: Mask ventilation without difficulty and Mask ventilation throughout procedure Airway Equipment and Method: Bite block Placement Confirmation: positive ETCO2 Dental Injury: Teeth and Oropharynx as per pre-operative assessment

## 2016-01-01 NOTE — Plan of Care (Signed)
Problem: Education: Goal: Knowledge of the prescribed therapeutic regimen will improve Outcome: Progressing Patient verbalized all his medications and that he's going to ECT tomorrow.

## 2016-01-01 NOTE — Progress Notes (Signed)
Redness with some warmness noted in right leg. Physician on call notified.

## 2016-01-01 NOTE — Progress Notes (Signed)
Receiving unit was given report: [facility] contact info [phone] for pending studies, Primary Physician is Dr. Janalyn Rouse, pt will return to BMU once medically cleared per Dr. Weber Cooks.

## 2016-01-01 NOTE — Procedures (Signed)
ECT SERVICES Physician's Interval Evaluation & Treatment Note  Patient Identification: GEDALYA DECHRISTOPHER MRN:  [id number] Date of Evaluation:  01/01/2016 TX #: 4  MADRS: 21 MMSE: 29  P.E. Findings:  Right lower extremity red swollen. Very slight fever. Lungs and heart normal.  Psychiatric Interval Note:  Mood continues to be hyperactive euphoric but improving  Subjective:  Patient is a 50 y.o. male seen for evaluation for Electroconvulsive Therapy. No specific new complaint  Treatment Summary:   []   Right Unilateral             [x]  Bilateral   % Energy : 1.0 ms, 100%   Impedance: 1050 ohms  Seizure Energy Index: No reading obtained  Postictal Suppression Index: No reading obtained  Seizure Concordance Index: No reading obtained  Medications  Pre Shock: Brevital 100 mg, succinylcholine 150 mg  Post Shock: None  Seizure Duration: Very little movement probably only 0-1 seconds of muscular movement. EEG seizure definitely took place but was difficult to read. Visually I am going to call it 39 seconds by EEG measurement   Comments: Tolerated well. We have made a tentative agreement for 8 treatments of which this is #4.   Lungs:  [x]   Clear to auscultation               []  Other:   Heart:    [x]   Regular rhythm             []  irregular rhythm    [x]   Previous H&P reviewed, patient examined and there are NO CHANGES                 []   Previous H&P reviewed, patient examined and there are changes noted.   Alethia Berthold, MD 7/31/201710:53 AM

## 2016-01-01 NOTE — H&P (Signed)
[facility] NAME: Cameron [name]    MR#:  [id number]  DATE OF BIRTH:  1966/01/11  DATE OF ADMISSION:  01/01/2016  PRIMARY CARE PHYSICIAN: [name], MD   REQUESTING/REFERRING PHYSICIAN: Clapacs,MD  CHIEF COMPLAINT:  Right leg redness  HISTORY OF PRESENT ILLNESS:  Cameron [name]  is a 50 y.o. 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 Patient is getting Coumadin and INR is at 2.73 today. Hospitalist team is consulted and he is getting admitted to medical floor for right lower extremity cellulitis. Patient reports right leg redness and pain. He was seen and examined in PACU after ECT treatment today  PAST MEDICAL HISTORY:   Past Medical History:  Diagnosis Date  . Bipolar 1 disorder ([place])   . CKD (chronic kidney disease), stage III   . Diabetes mellitus without complication ([place])   . DVT (deep venous thrombosis) ([place])   . Hypercholesteremia   . Hypertension     PAST SURGICAL HISTOIRY:   Past Surgical History:  Procedure Laterality Date  . APPENDECTOMY      SOCIAL HISTORY:   Social History  Substance Use Topics  . Smoking status: Never Smoker  . Smokeless tobacco: Not on file  . Alcohol use Yes     Comment: rarely    FAMILY HISTORY:   Family History  Problem Relation Age of Onset  . Stroke Other   . Diabetes Other     DRUG ALLERGIES:   Allergies  Allergen Reactions  . Asa [Aspirin] Other (See Comments)    Patient tolerates LOW DOSE ASPIRIN.    REVIEW OF SYSTEMS:  CONSTITUTIONAL: No fever, fatigue or weakness.  EYES: No blurred or double vision.  EARS, NOSE, AND THROAT: No tinnitus or ear pain.  RESPIRATORY: No cough, shortness of breath, wheezing or hemoptysis.  CARDIOVASCULAR: No chest pain, orthopnea, edema.  GASTROINTESTINAL:  No nausea, vomiting, diarrhea or abdominal pain.  GENITOURINARY: No dysuria, hematuria.  ENDOCRINE: No polyuria, nocturia,  HEMATOLOGY: No anemia, easy bruising or bleeding SKIN:Rt leg redness and pain MUSCULOSKELETAL: No joint pain or arthritis.   NEUROLOGIC: No tingling, numbness, weakness.  PSYCHIATRY: No anxiety or depression.   MEDICATIONS AT HOME:   Prior to Admission medications   Medication Sig Start Date End Date Taking? Authorizing Provider  ARIPiprazole (ABILIFY) 30 MG tablet Take 30 mg by mouth every evening.     Historical Provider, MD  atorvastatin (LIPITOR) 40 MG tablet Take 40 mg by mouth daily. 11/04/15   Historical Provider, MD  Canagliflozin (INVOKANA) 100 MG TABS Take 100 mg by mouth daily with breakfast.     Historical Provider, MD  carbamazepine (TEGRETOL) 200 MG tablet Take 400-600 mg by mouth 2 (two) times daily. Take 2 tablets in the morning Take 3 tablets in the evening    Historical Provider, MD  enalapril (VASOTEC) 5 MG tablet Take 5 mg by mouth 2 (two) times daily.    Historical Provider, MD  enoxaparin (LOVENOX) 150 MG/ML injection Inject 1 mL (150 mg total) into the skin every 12 (twelve) hours. 12/07/15   Donne Hazel, MD  fenofibrate 160 MG tablet Take 160 mg by mouth daily.    Historical Provider, MD  Insulin Glargine (LANTUS SOLOSTAR) 100 UNIT/ML Solostar Pen Inject 60 Units into the skin daily.     Historical Provider, MD  lamoTRIgine (  LAMICTAL) 100 MG tablet Take 400 mg by mouth at bedtime.    Historical Provider, MD  Multiple Vitamin (MULTIVITAMIN WITH MINERALS) TABS tablet Take 1 tablet by mouth daily.    Historical Provider, MD  QUEtiapine (SEROQUEL) 400 MG tablet Take 800 mg by mouth at bedtime. 12/02/15   Historical Provider, MD  TANZEUM 50 MG PEN Inject 50 mg into the skin every 7 (seven) days. 11/17/15   Historical Provider, MD  temazepam (RESTORIL) 15 MG capsule Take 15 mg by mouth at bedtime as needed for sleep.    Historical Provider, MD  warfarin  (COUMADIN) 5 MG tablet Take 2 tablets (10 mg total) by mouth daily. 12/07/15   Donne Hazel, MD      VITAL SIGNS:  There were no vitals taken for this visit.  PHYSICAL EXAMINATION:  GENERAL:  50 y.o.-year-old patient lying in the bed with no acute distress.  EYES: Pupils equal, round, reactive to light and accommodation. No scleral icterus. Extraocular muscles intact.  HEENT: Head atraumatic, normocephalic. Oropharynx and nasopharynx clear.  NECK:  Supple, no jugular venous distention. No thyroid enlargement, no tenderness.  LUNGS: Normal breath sounds bilaterally, no wheezing, rales,rhonchi or crepitation. No use of accessory muscles of respiration.  CARDIOVASCULAR: S1, S2 normal. No murmurs, rubs, or gallops.  ABDOMEN: Soft, nontender, nondistended. Bowel sounds present. No organomegaly or mass.  EXTREMITIES: Right lower extremity is erythematous and tender with some weeping No pedal edema, cyanosis, or clubbing.  NEUROLOGIC: Cranial nerves II through XII are intact. Muscle strength 5/5 in all extremities. Sensation intact. Gait not checked.  PSYCHIATRIC: The patient is alert and oriented x 3.  SKIN: No obvious rash, lesion, or ulcer.   LABORATORY PANEL:   CBC  Recent Labs Lab 01/01/16 0659  WBC 5.5  HGB 11.8*  HCT 33.3*  PLT 220   ------------------------------------------------------------------------------------------------------------------  Chemistries  No results for input(s): NA, K, CL, CO2, GLUCOSE, BUN, CREATININE, CALCIUM, MG, AST, ALT, ALKPHOS, BILITOT in the last 168 hours.  Invalid input(s): GFRCGP ------------------------------------------------------------------------------------------------------------------  Cardiac Enzymes No results for input(s): TROPONINI in the last 168 hours. ------------------------------------------------------------------------------------------------------------------  RADIOLOGY:  No results found.  EKG:   Orders placed or  performed during the hospital encounter of 12/03/15  . EKG 12-Lead  . EKG 12-Lead  . EKG    IMPRESSION AND PLAN:   Cameron [name]  is a 50 y.o. 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 Patient is getting Coumadin and INR is at 2.73 today. Hospitalist team is consulted and he is getting admitted to medical floor for right lower extremity cellulitis. Patient reports right leg redness and pain. He was seen and examined in PACU after ECT treatment today  # Right lower extremity cellulitis Admit to [place] floor Start the patient on vancomycin IV, pharmacy to dose We will rule out worsening of DVT with right lower extending to venous Dopplers  #Right lower extremity DVT Continue Coumadin. INR is therapeutic. Pharmacy to dose  #History of bipolar disorder with mania Consults psychiatry to continue ECT treatments Dr. Weber Cooks is aware  #Insulin-dependent diabetic mellitus Continue Lantus and sliding scale insulin. Provide diabetic diet  #Chronic kidney disease stage III Continue close monitoring of renal function   Disposition to psychiatry floor once medically stable. Please discuss with Dr. Weber Cooks    All the records are reviewed and case discussed with ED provider. Management  plans discussed with the patient, family and they are in agreement.  CODE STATUS: fc  TOTAL TIME TAKING CARE OF THIS PATIENT: 45  minutes.   Note: This dictation was prepared with Dragon dictation along with smaller phrase technology. Any transcriptional errors that result from this process are unintentional.  Nicholes Mango M.D on 01/01/2016 at 2:22 PM  Between 7am to 6pm - Pager - [phone]  After 6pm go to [email or web address] - password [email or web address] [facility]  [facility]  Office  [phone]  CC: Primary care physician; [name],  MD

## 2016-01-02 DIAGNOSIS — L03115 Cellulitis of right lower limb: Secondary | ICD-10-CM | POA: Diagnosis not present

## 2016-01-02 LAB — COMPREHENSIVE METABOLIC PANEL
ALBUMIN: 3.7 g/dL (ref 3.5–5.0)
ALT: 91 U/L — ABNORMAL HIGH (ref 17–63)
AST: 109 U/L — AB (ref 15–41)
Alkaline Phosphatase: 44 U/L (ref 38–126)
Anion gap: 12 (ref 5–15)
BILIRUBIN TOTAL: 0.8 mg/dL (ref 0.3–1.2)
BUN: 26 mg/dL — AB (ref 6–20)
CHLORIDE: 101 mmol/L (ref 101–111)
CO2: 25 mmol/L (ref 22–32)
Calcium: 9 mg/dL (ref 8.9–10.3)
Creatinine, Ser: 1.71 mg/dL — ABNORMAL HIGH (ref 0.61–1.24)
GFR calc Af Amer: 52 mL/min — ABNORMAL LOW (ref >60)
GFR calc non Af Amer: 45 mL/min — ABNORMAL LOW (ref >60)
GLUCOSE: 121 mg/dL — AB (ref 65–99)
Potassium: 3.6 mmol/L (ref 3.5–5.1)
SODIUM: 138 mmol/L (ref 135–145)
Total Protein: 7.3 g/dL (ref 6.5–8.1)

## 2016-01-02 LAB — GLUCOSE, CAPILLARY
GLUCOSE-CAPILLARY: 132 mg/dL — AB (ref 65–99)
Glucose-Capillary: 123 mg/dL — ABNORMAL HIGH (ref 65–99)
Glucose-Capillary: 106 mg/dL — ABNORMAL HIGH (ref 65–99)
Glucose-Capillary: 103 mg/dL — ABNORMAL HIGH (ref 65–99)

## 2016-01-02 LAB — CBC
HEMATOCRIT: 32.6 % — AB (ref 40.0–52.0)
HEMOGLOBIN: 11.1 g/dL — AB (ref 13.0–18.0)
MCH: 28.6 pg (ref 26.0–34.0)
MCHC: 33.9 g/dL (ref 32.0–36.0)
MCV: 84.4 fL (ref 80.0–100.0)
Platelets: 246 10*3/uL (ref 150–440)
RBC: 3.87 MIL/uL — ABNORMAL LOW (ref 4.40–5.90)
RDW: 12.6 % (ref 11.5–14.5)
WBC: 4.1 10*3/uL (ref 3.8–10.6)

## 2016-01-02 LAB — HEMOGLOBIN A1C: Hgb A1c MFr Bld: 7.4 % — ABNORMAL HIGH (ref 4.0–6.0)

## 2016-01-02 LAB — PROTIME-INR
INR: 3.13
Prothrombin Time: 32.9 seconds — ABNORMAL HIGH (ref 11.4–15.2)

## 2016-01-02 MED ORDER — QUETIAPINE FUMARATE 300 MG PO TABS
1200.0000 mg | ORAL_TABLET | Freq: Every day | ORAL | Status: DC
  Administered 2016-01-02 – 2016-01-03 (×2): 1200 mg via ORAL
  Filled 2016-01-02: qty 4
  Filled 2016-01-02: qty 3
  Filled 2016-01-02: qty 4

## 2016-01-02 MED ORDER — FUROSEMIDE 40 MG PO TABS
40.0000 mg | ORAL_TABLET | Freq: Every day | ORAL | Status: DC
  Administered 2016-01-02 – 2016-01-03 (×2): 40 mg via ORAL
  Filled 2016-01-02 (×2): qty 1

## 2016-01-02 MED ORDER — WARFARIN SODIUM 5 MG PO TABS
7.5000 mg | ORAL_TABLET | Freq: Every day | ORAL | Status: DC
  Administered 2016-01-03: 7.5 mg via ORAL
  Filled 2016-01-02: qty 1

## 2016-01-02 MED ORDER — SODIUM CHLORIDE 0.9 % IV SOLN
3.0000 g | Freq: Four times a day (QID) | INTRAVENOUS | Status: DC
  Administered 2016-01-02 – 2016-01-04 (×6): 3 g via INTRAVENOUS
  Filled 2016-01-02 (×11): qty 3

## 2016-01-02 NOTE — Progress Notes (Addendum)
ANTICOAGULATION CONSULT NOTE - Follow Up Consult  Pharmacy Consult for Warfarin Indication: VTE treatment  Allergies  Allergen Reactions  . Asa [Aspirin] Other (See Comments)    Patient tolerates LOW DOSE ASPIRIN.    Patient Measurements: Height: 6' (182.9 cm) Weight: 299 lb (135.6 kg) IBW/kg (Calculated) : 77.6  Vital Signs: Temp: 98.1 F (36.7 C) (08/01 0501) Temp Source: Oral (08/01 0501) BP: 115/65 (08/01 0951) Pulse Rate: 100 (08/01 0501)  Labs:  Recent Labs  12/31/15 0619  01/01/16 0659 01/01/16 1545 01/01/16 2025 01/02/16 0430 01/02/16 0434  HGB  --   < > 11.8* 11.7*  --  11.1*  --   HCT  --   --  33.3* 33.1*  --  32.6*  --   PLT  --   --  220 238  --  246  --   LABPROT 33.4*  --  29.5*  --   --   --  32.9*  INR 3.19  --  2.73  --   --   --  3.13  CREATININE  --   --   --   --  1.73* 1.71*  --   < > = values in this interval not displayed. Estimated Creatinine Clearance: 72.9 mL/min (by C-G formula based on SCr of 1.71 mg/dL).  Medical History: Past Medical History:  Diagnosis Date  . Bipolar 1 disorder ([place])   . CKD (chronic kidney disease), stage III   . Diabetes mellitus without complication ([place])   . DVT (deep venous thrombosis) ([place])   . Hypercholesteremia   . Hypertension    Medications:  Warfarin 10 mg po daily  Assessment: 52 yom admitted to ED BHU with manic symptoms. Recently discharged from [facility] with DVT/PE, had been bridging LMWH and VKA prior to admission.  INR History: 7/9: INR - 1.99 7/10: no INR- warfarin 12.5 mg po daily at 0200 7/11: INR - 2.97- warfarin held 7/12: INR - 1.92 - warfarin 10 mg 7/13: INR - 1.95 - warfarin 10 mg 7/14: INR - 2.56 - warfarin 5 mg 7/15: INR - 2.73 - warfarin 5 mg 7/16: INR - 2.39 - warfarin 7.5 mg 7/17: INR - 2.22 - warfarin 7.5 mg 7/18: INR - 2.53 - warfarin 7.5 mg 7/19: INR - 2.75 - warfarin 7.5 mg 7/20: INR - 2.99 - warfarin 6.5 mg 7/21: INR - 2.64 - warfarin 6.5 mg  7/22: INR: 2.44; warfarin  8  7/23: INR: 2.60;  Warfarin 10mg  7/24: INR 2.44 - warfarin 10mg  7/25:  INR 3.22 - warfarin 10mg  7/26:  INR 3.08 - warfarin 8mg  7/27:  INR 3.24 - warfarin 8 mg 7/28:  INR 2.79 - 8 mg 7/29:  INR 2.74- 8 mg 7/30:  INR 3.19 - 7 mg 7/31:  INR 2.73  7.5mg  8/1:    INR 3.13  7.5mg   Goal of Therapy:  INR 2-3 Monitor platelets by anticoagulation protocol: Yes   Plan:  INR is slightly supra therapeutic today. Dose was time for 1000 today for some reason, therefore dose was already given today. Will recheck INR in the AM. Will adjust dose as needed.  (Patient with orders for carbamazepine which interacts with warfarin to decrease the INR.  Will follow INR closely.)   Pt will need CBC q3 days per policy.   Pharmacy will continue to follow.   Glo Herring Clinical Pharmacist  01/02/2016,11:50 AM

## 2016-01-02 NOTE — Consult Note (Signed)
[name] Consult   Reason for Consult:  Follow-up consult for 50 year old man with bipolar disorder and cellulitis Referring Physician:  Gouru Patient Identification: Cameron [name] MRN:  [id number] Principal Diagnosis: Bipolar I disorder, current or most recent episode manic, with psychotic features [facility]) Diagnosis:   Patient Active Problem List   Diagnosis Date Noted  . Cellulitis of leg, right [L03.115] 01/01/2016  . Bipolar I disorder, current or most recent episode manic, with psychotic features (Cameron [name]) [F31.2] 12/11/2015  . Pulmonary embolism ([place]) [I26.99] 12/04/2015  . Diabetes mellitus without complication ([place]) [J62.8]   . Hypercholesteremia [E78.00]   . CKD (chronic kidney disease), stage III [N18.3]   . Acute deep vein thrombosis (DVT) of femoral vein of right lower extremity (H[facility]) [I82.411]     Total Time spent with patient: 45 minutes  Subjective:   Cameron [name] is a 50 y.o. male patient admitted with "I'm feeling great".  Follow-up note for Tuesday, August 1. Patient continues to have no new complaint and to state that he is feeling very good. Does not seem to be bothered much by his leg. Affect and mood continued to be euphoric with racing and tangential thinking. Medical staff has been concerned about some of his behavior. It was reported on one occasion he went into another patient's room. He claims that that was by accident. When I came to see him this afternoon he was in his room and did not seem to be trying to do anything inappropriate but he is certainly still hyperactive and hyperverbal with only partial insight.  HPI:  Follow-up for this 50 year old man who had been on the psychiatric ward until this morning. He had cellulitis which appeared to be getting worse and has been transferred to the medical service. Patient interviewed and chart reviewed. Patient was cooperative and pleasant. He says he is feeling great. Denies any acute pain. Denies  feeling sick. His thoughts are still disorganized tangential with lots of flight of ideas but there is no threatening behavior no psychosis and he is basically cooperative with treatment.  Past Psychiatric History: Long history of bipolar disorder current episode manic has been going on for over a month. Slowly responding to ECT along with mood stabilizing medicine no history of suicide attempts  Risk to Self: Is patient at risk for suicide?: No Risk to Others:   Prior Inpatient Therapy:   Prior Outpatient Therapy:    Past Medical History:  Past Medical History:  Diagnosis Date  . Bipolar 1 disorder ([place])   . CKD (chronic kidney disease), stage III   . Diabetes mellitus without complication ([place])   . DVT (deep venous thrombosis) ([place])   . Hypercholesteremia   . Hypertension     Past Surgical History:  Procedure Laterality Date  . APPENDECTOMY     Family History:  Family History  Problem Relation Age of Onset  . Stroke Other   . Diabetes Other    Family Psychiatric  History: Positive for bipolar disorder Social History:  History  Alcohol Use  . Yes    Comment: rarely     History  Drug Use No    Social History   Social History  . Marital status: Married    Spouse name: N/A  . Number of children: N/A  . Years of education: N/A   Social History Main Topics  . Smoking status: Never Smoker  . Smokeless tobacco: None  . Alcohol use Yes     Comment: rarely  .  Drug use: No  . Sexual activity: Not Asked   Other Topics Concern  . None   Social History Narrative  . None   Additional Social History:    Allergies:   Allergies  Allergen Reactions  . Asa [Aspirin] Other (See Comments)    Patient tolerates LOW DOSE ASPIRIN.    Labs:  Results for orders placed or performed during the hospital encounter of 01/01/16 (from the past 48 hour(s))  CBC WITH DIFFERENTIAL     Status: Abnormal   Collection Time: 01/01/16  3:45 PM  Result Value Ref Range   WBC 4.9 3.8 -  10.6 K/uL   RBC 3.93 (L) 4.40 - 5.90 MIL/uL   Hemoglobin 11.7 (L) 13.0 - 18.0 g/dL   HCT 33.1 (L) 40.0 - 52.0 %   MCV 84.1 80.0 - 100.0 fL   MCH 29.6 26.0 - 34.0 pg   MCHC 35.3 32.0 - 36.0 g/dL   RDW 12.5 11.5 - 14.5 %   Platelets 238 150 - 440 K/uL   Neutrophils Relative % 71 %   Neutro Abs 3.4 1.4 - 6.5 K/uL   Lymphocytes Relative 16 %   Lymphs Abs 0.8 (L) 1.0 - 3.6 K/uL   Monocytes Relative 13 %   Monocytes Absolute 0.6 0.2 - 1.0 K/uL   Eosinophils Relative 0 %   Eosinophils Absolute 0.0 0 - 0.7 K/uL   Basophils Relative 0 %   Basophils Absolute 0.0 0 - 0.1 K/uL  Glucose, capillary     Status: Abnormal   Collection Time: 01/01/16  4:50 PM  Result Value Ref Range   Glucose-Capillary 117 (H) 65 - 99 mg/dL  Basic metabolic panel     Status: Abnormal   Collection Time: 01/01/16  8:25 PM  Result Value Ref Range   Sodium 138 135 - 145 mmol/L   Potassium 3.6 3.5 - 5.1 mmol/L   Chloride 102 101 - 111 mmol/L   CO2 27 22 - 32 mmol/L   Glucose, Bld 119 (H) 65 - 99 mg/dL   BUN 24 (H) 6 - 20 mg/dL   Creatinine, Ser 1.73 (H) 0.61 - 1.24 mg/dL   Calcium 9.0 8.9 - 10.3 mg/dL   GFR calc non Af Amer 44 (L) >60 mL/min   GFR calc Af Amer 51 (L) >60 mL/min    Comment: (NOTE) The eGFR has been calculated using the CKD EPI equation. This calculation has not been validated in all clinical situations. eGFR's persistently <60 mL/min signify possible Chronic Kidney Disease.    Anion gap 9 5 - 15  Glucose, capillary     Status: Abnormal   Collection Time: 01/01/16  9:34 PM  Result Value Ref Range   Glucose-Capillary 105 (H) 65 - 99 mg/dL  Comprehensive metabolic panel     Status: Abnormal   Collection Time: 01/02/16  4:30 AM  Result Value Ref Range   Sodium 138 135 - 145 mmol/L   Potassium 3.6 3.5 - 5.1 mmol/L   Chloride 101 101 - 111 mmol/L   CO2 25 22 - 32 mmol/L   Glucose, Bld 121 (H) 65 - 99 mg/dL   BUN 26 (H) 6 - 20 mg/dL   Creatinine, Ser 1.71 (H) 0.61 - 1.24 mg/dL   Calcium 9.0  8.9 - 10.3 mg/dL   Total Protein 7.3 6.5 - 8.1 g/dL   Albumin 3.7 3.5 - 5.0 g/dL   AST 109 (H) 15 - 41 U/L   ALT 91 (H) 17 - 63 U/L  Alkaline Phosphatase 44 38 - 126 U/L   Total Bilirubin 0.8 0.3 - 1.2 mg/dL   GFR calc non Af Amer 45 (L) >60 mL/min   GFR calc Af Amer 52 (L) >60 mL/min    Comment: (NOTE) The eGFR has been calculated using the CKD EPI equation. This calculation has not been validated in all clinical situations. eGFR's persistently <60 mL/min signify possible Chronic Kidney Disease.    Anion gap 12 5 - 15  CBC     Status: Abnormal   Collection Time: 01/02/16  4:30 AM  Result Value Ref Range   WBC 4.1 3.8 - 10.6 K/uL   RBC 3.87 (L) 4.40 - 5.90 MIL/uL   Hemoglobin 11.1 (L) 13.0 - 18.0 g/dL   HCT 32.6 (L) 40.0 - 52.0 %   MCV 84.4 80.0 - 100.0 fL   MCH 28.6 26.0 - 34.0 pg   MCHC 33.9 32.0 - 36.0 g/dL   RDW 12.6 11.5 - 14.5 %   Platelets 246 150 - 440 K/uL  Hemoglobin A1c     Status: Abnormal   Collection Time: 01/02/16  4:30 AM  Result Value Ref Range   Hgb A1c MFr Bld 7.4 (H) 4.0 - 6.0 %  Protime-INR     Status: Abnormal   Collection Time: 01/02/16  4:34 AM  Result Value Ref Range   Prothrombin Time 32.9 (H) 11.4 - 15.2 seconds   INR 3.13   Glucose, capillary     Status: Abnormal   Collection Time: 01/02/16  7:31 AM  Result Value Ref Range   Glucose-Capillary 123 (H) 65 - 99 mg/dL  Glucose, capillary     Status: Abnormal   Collection Time: 01/02/16 11:27 AM  Result Value Ref Range   Glucose-Capillary 132 (H) 65 - 99 mg/dL    Current Facility-Administered Medications  Medication Dose Route Frequency Provider Last Rate Last Dose  . Albiglutide PEN 50 mg  50 mg Subcutaneous Q7 days Nicholes Mango, MD   50 mg at 01/01/16 1833  . Ampicillin-Sulbactam (UNASYN) 3 g in sodium chloride 0.9 % 100 mL IVPB  3 g Intravenous Q6H Gladstone Lighter, MD      . ARIPiprazole (ABILIFY) tablet 30 mg  30 mg Oral QPM Nicholes Mango, MD   30 mg at 01/01/16 1709  . atorvastatin  (LIPITOR) tablet 40 mg  40 mg Oral Daily Nicholes Mango, MD   40 mg at 01/02/16 0952  . canagliflozin (INVOKANA) tablet 100 mg  100 mg Oral Q breakfast Illene Silver Gouru, MD      . carbamazepine (TEGRETOL) tablet 400 mg  400 mg Oral q morning - 10a Nicholes Mango, MD   400 mg at 01/02/16 0952  . carbamazepine (TEGRETOL) tablet 600 mg  600 mg Oral QPM Nicholes Mango, MD   600 mg at 01/01/16 1709  . docusate sodium (COLACE) capsule 100 mg  100 mg Oral BID Nicholes Mango, MD   100 mg at 01/02/16 0952  . enalapril (VASOTEC) tablet 5 mg  5 mg Oral BID Nicholes Mango, MD   5 mg at 01/02/16 0952  . fenofibrate tablet 160 mg  160 mg Oral Daily Nicholes Mango, MD   160 mg at 01/02/16 1001  . furosemide (LASIX) tablet 40 mg  40 mg Oral Daily Gladstone Lighter, MD      . insulin aspart (novoLOG) injection 0-5 Units  0-5 Units Subcutaneous QHS Aruna Gouru, MD      . insulin aspart (novoLOG) injection 0-9 Units  0-9 Units Subcutaneous  TID WC Nicholes Mango, MD   1 Units at 01/02/16 1300  . insulin glargine (LANTUS) injection 60 Units  60 Units Subcutaneous Daily Nicholes Mango, MD   60 Units at 01/02/16 0952  . lamoTRIgine (LAMICTAL) tablet 400 mg  400 mg Oral QHS Nicholes Mango, MD   400 mg at 01/01/16 2237  . multivitamin with minerals tablet 1 tablet  1 tablet Oral Daily Nicholes Mango, MD   1 tablet at 01/02/16 1001  . ondansetron (ZOFRAN) tablet 4 mg  4 mg Oral Q6H PRN Nicholes Mango, MD       Or  . ondansetron (ZOFRAN) injection 4 mg  4 mg Intravenous Q6H PRN Nicholes Mango, MD      . QUEtiapine (SEROQUEL) tablet 1,200 mg  1,200 mg Oral QHS Mitch Arquette T Sierra Bissonette, MD      . sodium chloride flush (NS) 0.9 % injection 3 mL  3 mL Intravenous Q12H Nicholes Mango, MD   3 mL at 01/02/16 0953  . temazepam (RESTORIL) capsule 15 mg  15 mg Oral QHS PRN Nicholes Mango, MD      . vancomycin (VANCOCIN) IVPB 1000 mg/200 mL premix  1,000 mg Intravenous Q12H Gladstone Lighter, MD   1,000 mg at 01/02/16 0412  . [START ON 01/03/2016] warfarin (COUMADIN) tablet 7.5 mg  7.5 mg  Oral q1800 Gladstone Lighter, MD      . Warfarin - Pharmacist Dosing Inpatient   Does not apply [id number] Nicholes Mango, MD   7.5 each at 01/01/16 1800    Musculoskeletal: Strength & Muscle Tone: within normal limits Gait & Station: normal Patient leans: N/A  Psychiatric Specialty Exam: Physical Exam  Nursing note and vitals reviewed. Constitutional: He appears well-developed and well-nourished.  HENT:  Head: Normocephalic and atraumatic.  Eyes: Conjunctivae are normal. Pupils are equal, round, and reactive to light.  Neck: Normal range of motion.  Cardiovascular: Normal heart sounds.   Respiratory: Effort normal.  GI: Soft.  Musculoskeletal: Normal range of motion.  Neurological: He is alert.  Skin: Skin is warm and dry.     Psychiatric: His affect is labile. His speech is tangential. He is hyperactive. Thought content is not paranoid. Cognition and memory are normal. He expresses impulsivity. He expresses no suicidal ideation.    Review of Systems  Constitutional: Negative.   HENT: Negative.   Eyes: Negative.   Respiratory: Negative.   Cardiovascular: Negative.   Gastrointestinal: Negative.   Musculoskeletal: Negative.   Skin: Negative.   Neurological: Negative.   Psychiatric/Behavioral: Negative for depression, hallucinations, memory loss, substance abuse and suicidal ideas. The patient has insomnia. The patient is not nervous/anxious.     Blood pressure 124/71, pulse 89, temperature 98.3 F (36.8 C), temperature source Oral, resp. rate 20, height 6' (1.829 m), weight 135.6 kg (299 lb), SpO2 99 %.Body mass index is 40.55 kg/m.  General Appearance: Casual  Eye Contact:  Good  Speech:  Pressured  Volume:  Increased  Mood:  Euphoric  Affect:  Labile  Thought Process:  Disorganized  Orientation:  Full (Time, Place, and Person)  Thought Content:  Tangential  Suicidal Thoughts:  No  Homicidal Thoughts:  No  Memory:  Immediate;   Fair Recent;   Fair Remote;   Fair    Judgement:  Impaired  Insight:  Shallow  Psychomotor Activity:  Increased and Restlessness  Concentration:  Concentration: Fair  Recall:  [employer] of Knowledge:  Fair  Language:  Fair  Akathisia:  No  Handed:  Right  AIMS (if indicated):     Assets:  Communication Skills Desire for Improvement Financial Resources/Insurance Housing Resilience Social Support  ADL's:  Intact  Cognition:  WNL  Sleep:        Treatment Plan Summary: Daily contact with patient to assess and evaluate symptoms and progress in treatment, Medication management and Plan We are going to increase his quetiapine to 1200 mg at night. he is already on large doses of multiple medications making it a little difficult to know what can be safely adjusted. I have been resisting increasing the Tegretol dose in part because of its negative impact on his ECT treatment. I had suggested they put a sitter and with him to help keep him in the room and that looks like it's working better. I'm hoping that he will be able to come back to the psychiatry service within the next day or 2 if he can stop taking intravenous antibiotics. Supportive counseling with the patient and tried to request that he stay in his own room. ECT orders will be completed for tomorrow.   Disposition: Recommend psychiatric Inpatient admission when medically cleared. Supportive therapy provided about ongoing stressors.  Alethia Berthold, MD 01/02/2016 4:21 PM

## 2016-01-02 NOTE — Progress Notes (Signed)
ANTIBIOTIC CONSULT NOTE - INITIAL  Pharmacy Consult for Unasyn  Indication: cellulitis  Allergies  Allergen Reactions  . Asa [Aspirin] Other (See Comments)    Patient tolerates LOW DOSE ASPIRIN.    Patient Measurements: Height: 6' (182.9 cm) Weight: 299 lb (135.6 kg) IBW/kg (Calculated) : 77.6 Adjusted Body Weight:   Vital Signs: Temp: 98.3 F (36.8 C) (08/01 1150) Temp Source: Oral (08/01 1150) BP: 124/71 (08/01 1150) Pulse Rate: 89 (08/01 1150) Intake/Output from previous day: 07/31 0701 - 08/01 0700 In: 3 [I.V.:3] Out: 400 [Urine:400] Intake/Output from this shift: Total I/O In: 240 [P.O.:240] Out: -   Labs:  Recent Labs  01/01/16 0659 01/01/16 1545 01/01/16 2025 01/02/16 0430  WBC 5.5 4.9  --  4.1  HGB 11.8* 11.7*  --  11.1*  PLT 220 238  --  246  CREATININE  --   --  1.73* 1.71*   Estimated Creatinine Clearance: 72.9 mL/min (by C-G formula based on SCr of 1.71 mg/dL). No results for input(s): VANCOTROUGH, VANCOPEAK, VANCORANDOM, GENTTROUGH, GENTPEAK, GENTRANDOM, TOBRATROUGH, TOBRAPEAK, TOBRARND, AMIKACINPEAK, AMIKACINTROU, AMIKACIN in the last 72 hours.   Microbiology: Recent Results (from the past 720 hour(s))  MRSA PCR Screening     Status: None   Collection Time: 12/04/15  5:40 AM  Result Value Ref Range Status   MRSA by PCR NEGATIVE NEGATIVE Final    Comment:        The GeneXpert MRSA Assay (FDA approved for NASAL specimens only), is one component of a comprehensive MRSA colonization surveillance program. It is not intended to diagnose MRSA infection nor to guide or monitor treatment for MRSA infections.   CULTURE, BLOOD (ROUTINE X 2) w Reflex to ID Panel     Status: None (Preliminary result)   Collection Time: 01/01/16 10:13 AM  Result Value Ref Range Status   Specimen Description BLOOD LEFT ASSIST CONTROL  Final   Special Requests   Final    BOTTLES DRAWN AEROBIC AND ANAEROBIC  AEROBIC [place], ANAEROBIC 3CC   Culture NO GROWTH < 24 HOURS   Final   Report Status PENDING  Incomplete  CULTURE, BLOOD (ROUTINE X 2) w Reflex to ID Panel     Status: None (Preliminary result)   Collection Time: 01/01/16 10:19 AM  Result Value Ref Range Status   Specimen Description BLOOD RIGHT ASSIST CONTROL  Final   Special Requests   Final    BOTTLES DRAWN AEROBIC AND ANAEROBIC  AEROBIC 4CC, ANAEROBIC [place]   Culture NO GROWTH < 24 HOURS  Final   Report Status PENDING  Incomplete    Medical History: Past Medical History:  Diagnosis Date  . Bipolar 1 disorder ([place])   . CKD (chronic kidney disease), stage III   . Diabetes mellitus without complication ([place])   . DVT (deep venous thrombosis) ([place])   . Hypercholesteremia   . Hypertension     Medications:  Prescriptions Prior to Admission  Medication Sig Dispense Refill Last Dose  . ARIPiprazole (ABILIFY) 30 MG tablet Take 30 mg by mouth every evening.    12/24/2015  . atorvastatin (LIPITOR) 40 MG tablet Take 40 mg by mouth daily.  11 12/24/2015  . Canagliflozin (INVOKANA) 100 MG TABS Take 100 mg by mouth daily with breakfast.    12/24/2015  . carbamazepine (TEGRETOL) 200 MG tablet Take 400-600 mg by mouth 2 (two) times daily. Take 2 tablets in the morning Take 3 tablets in the evening   12/24/2015  . enalapril (VASOTEC) 5 MG tablet Take  5 mg by mouth 2 (two) times daily.   12/25/2015 at 0627  . enoxaparin (LOVENOX) 150 MG/ML injection Inject 1 mL (150 mg total) into the skin every 12 (twelve) hours. 8 Syringe 0 12/24/2015  . fenofibrate 160 MG tablet Take 160 mg by mouth daily.   12/24/2015  . Insulin Glargine (LANTUS SOLOSTAR) 100 UNIT/ML Solostar Pen Inject 60 Units into the skin daily.    12/24/2015  . lamoTRIgine (LAMICTAL) 100 MG tablet Take 400 mg by mouth at bedtime.   12/24/2015  . Multiple Vitamin (MULTIVITAMIN WITH MINERALS) TABS tablet Take 1 tablet by mouth daily.   12/24/2015  . QUEtiapine (SEROQUEL) 400 MG tablet Take 800 mg by mouth at bedtime.   12/24/2015  . TANZEUM 50 MG PEN Inject 50  mg into the skin every 7 (seven) days.  2 12/24/2015  . temazepam (RESTORIL) 15 MG capsule Take 15 mg by mouth at bedtime as needed for sleep.   12/24/2015  . warfarin (COUMADIN) 5 MG tablet Take 2 tablets (10 mg total) by mouth daily. 60 tablet 0 12/24/2015   Scheduled:  Marland Kitchen Albiglutide  50 mg Subcutaneous Q7 days  . ampicillin-sulbactam (UNASYN) IV  3 g Intravenous Q6H  . ARIPiprazole  30 mg Oral QPM  . atorvastatin  40 mg Oral Daily  . canagliflozin  100 mg Oral Q breakfast  . carbamazepine  400 mg Oral q morning - 10a  . carbamazepine  600 mg Oral QPM  . docusate sodium  100 mg Oral BID  . enalapril  5 mg Oral BID  . fenofibrate  160 mg Oral Daily  . furosemide  40 mg Oral Daily  . insulin aspart  0-5 Units Subcutaneous QHS  . insulin aspart  0-9 Units Subcutaneous TID WC  . insulin glargine  60 Units Subcutaneous Daily  . lamoTRIgine  400 mg Oral QHS  . multivitamin with minerals  1 tablet Oral Daily  . QUEtiapine  800 mg Oral QHS  . sodium chloride flush  3 mL Intravenous Q12H  . vancomycin  1,000 mg Intravenous Q12H  . [START ON 01/03/2016] warfarin  7.5 mg Oral q1800  . Warfarin - Pharmacist Dosing Inpatient   Does not apply q1800   Assessment: CrCl = 72.9 ml/min Blood Cx 7/31 = NGTD   Goal of Therapy:  resolution of infection  Plan:  Expected duration 7 days with resolution of temperature and/or normalization of WBC   Unasyn 3 gm IV Q6H ordered to start on 8/1 @ 16:00.   Othon Guardia D 01/02/2016,3:49 PM

## 2016-01-02 NOTE — Progress Notes (Signed)
Cameron [name] at [place] NAME: Cameron [name]    MR#:  [id number]  DATE OF BIRTH:  March 17, 1966  SUBJECTIVE:  CHIEF COMPLAINT:  No chief complaint on file.  - maniac patient, constantly talking, stands up mostly outside his room and walking into other patients rooms - following commands, not aggressive or combatible  REVIEW OF SYSTEMS:  Review of Systems  Unable to perform ROS: Psychiatric disorder    DRUG ALLERGIES:   Allergies  Allergen Reactions  . Asa [Aspirin] Other (See Comments)    Patient tolerates LOW DOSE ASPIRIN.    VITALS:  Blood pressure 124/71, pulse 89, temperature 98.3 F (36.8 C), temperature source Oral, resp. rate 20, height 6' (1.829 m), weight 135.6 kg (299 lb), SpO2 99 %.  PHYSICAL EXAMINATION:  Physical Exam  GENERAL:  50 year-old obese patient standing at the door, with no acute distress.  EYES: Pupils equal, round, reactive to light and accommodation. No scleral icterus. Extraocular muscles intact.  HEENT: Head atraumatic, normocephalic. Oropharynx and nasopharynx clear.  NECK:  Supple, no jugular venous distention. No thyroid enlargement, no tenderness.  LUNGS: Normal breath sounds bilaterally, no wheezing, rales,rhonchi or crepitation. No use of accessory muscles of respiration. Decreased bibasilar breath sounds,. CARDIOVASCULAR: S1, S2 normal. No murmurs, rubs, or gallops.  ABDOMEN: Soft, nontender, nondistended. Bowel sounds present. No organomegaly or mass.  EXTREMITIES: 3+ edema of both lower extremities, right leg is more erythematous than the left, superficial skin ulcers present. No cyanosis, or clubbing.  NEUROLOGIC: Cranial nerves II through XII are intact. Muscle strength 5/5 in all extremities. Sensation intact. Gait not checked.  PSYCHIATRIC: The patient is alert and oriented x 3.  SKIN: No obvious rash, lesion, or ulcer.    LABORATORY PANEL:   CBC  Recent Labs Lab 01/02/16 0430    WBC 4.1  HGB 11.1*  HCT 32.6*  PLT 246   ------------------------------------------------------------------------------------------------------------------  Chemistries   Recent Labs Lab 01/02/16 0430  NA 138  K 3.6  CL 101  CO2 25  GLUCOSE 121*  BUN 26*  CREATININE 1.71*  CALCIUM 9.0  AST 109*  ALT 91*  ALKPHOS 44  BILITOT 0.8   ------------------------------------------------------------------------------------------------------------------  Cardiac Enzymes No results for input(s): TROPONINI in the last 168 hours. ------------------------------------------------------------------------------------------------------------------  RADIOLOGY:  US Venous Img Lower Unilateral Right  Result Date: 01/01/2016 CLINICAL DATA:  Right lower extremity and pulmonary embolism. Worsening pain and edema. EXAM: RIGHT LOWER EXTREMITY VENOUS DUPLEX ULTRASOUND TECHNIQUE: Doppler venous assessment of the right lower extremity deep venous system was performed, including characterization of spectral flow, compressibility, and phasicity. COMPARISON:  None. FINDINGS: There is complete compressibility of the common femoral, femoral, and popliteal veins. Doppler analysis demonstrates respiratory phasicity and augmentation of flow with calf compression. No obvious superficial vein or calf vein thrombosis. IMPRESSION: No evidence of acute DVT. Electronically Signed   By: Cameron [name] M.D.   On: 01/01/2016 16:44    EKG:   Orders placed or performed during the hospital encounter of 12/03/15  . EKG 12-Lead  . EKG 12-Lead  . EKG    ASSESSMENT AND PLAN:   50 year old male with past medical history significant for bipolar disorder with refractory mania, CK D stage III, diabetes mellitus, right leg DVT on Coumadin, hypertension presents to the hospital secondary to severe mania. Medical admission secondary to worsening right leg cellulitis.  #1 right lower extremity cellulitis-partially secondary to  chronic skin changes from his chronic right lower extremity DVT.  Up and repeat Dopplers do not show any occlusive clot. -He does have 2+ edema, recommended to keep his feet up however patient is always standing up on his feet. -Started on Lasix. Continue vancomycin, added Unasyn today. -Follow blood cultures. - likely discharge tomorrow on oral ABX  #2 bipolar disorder with mania-on pretty high doses of antipsychotics. -Currently receiving ECT treatments. Treatment tomorrow. -Appreciate psychiatric consult.  #3 insulin-dependent diabetes mellitus- on Albiglutide pen weekly, Invokana, Lantus and SSI  #4 Right leg chronic DVT- continue warfarin, INR at 3 today  #5 HTN- on enalapril  Sitter today. Likely discharge tomorrow to psychiatric floor    All the records are reviewed and case discussed with Care Management/Social Workerr. Management plans discussed with the patient, family and they are in agreement.  CODE STATUS: Full Code  TOTAL TIME TAKING CARE OF THIS PATIENT: 37 minutes.   POSSIBLE D/C IN 1-2 DAYS, DEPENDING ON CLINICAL CONDITION.   Cameron [name] M.D on 01/02/2016 at 3:26 PM  Between 7am to 6pm - Pager - [phone]  After 6pm go to [email or web address] - password [email or web address]  Office  [phone]  CC: Primary care physician; Cameron Amel, MD

## 2016-01-03 ENCOUNTER — Inpatient Hospital Stay: Payer: 59 | Admitting: Anesthesiology

## 2016-01-03 ENCOUNTER — Encounter: Payer: Self-pay | Admitting: *Deleted

## 2016-01-03 DIAGNOSIS — L03115 Cellulitis of right lower limb: Secondary | ICD-10-CM | POA: Diagnosis not present

## 2016-01-03 LAB — BASIC METABOLIC PANEL
ANION GAP: 10 (ref 5–15)
BUN: 28 mg/dL — ABNORMAL HIGH (ref 6–20)
CALCIUM: 9 mg/dL (ref 8.9–10.3)
CO2: 24 mmol/L (ref 22–32)
Chloride: 105 mmol/L (ref 101–111)
Creatinine, Ser: 1.76 mg/dL — ABNORMAL HIGH (ref 0.61–1.24)
GFR, EST AFRICAN AMERICAN: 50 mL/min — AB (ref >60)
GFR, EST NON AFRICAN AMERICAN: 43 mL/min — AB (ref >60)
Glucose, Bld: 90 mg/dL (ref 65–99)
POTASSIUM: 3.4 mmol/L — AB (ref 3.5–5.1)
Sodium: 139 mmol/L (ref 135–145)

## 2016-01-03 LAB — GLUCOSE, CAPILLARY
GLUCOSE-CAPILLARY: 82 mg/dL (ref 65–99)
GLUCOSE-CAPILLARY: 122 mg/dL — AB (ref 65–99)
GLUCOSE-CAPILLARY: 67 mg/dL (ref 65–99)
Glucose-Capillary: 82 mg/dL (ref 65–99)
Glucose-Capillary: 60 mg/dL — ABNORMAL LOW (ref 65–99)
Glucose-Capillary: 83 mg/dL (ref 65–99)
Glucose-Capillary: 86 mg/dL (ref 65–99)
Glucose-Capillary: 118 mg/dL — ABNORMAL HIGH (ref 65–99)

## 2016-01-03 LAB — PROTIME-INR
INR: 2.74
PROTHROMBIN TIME: 29.6 s — AB (ref 11.4–15.2)

## 2016-01-03 MED ORDER — SODIUM CHLORIDE 0.9 % IV SOLN
INTRAVENOUS | Status: DC
  Administered 2016-01-03: 10:00:00 via INTRAVENOUS

## 2016-01-03 MED ORDER — KETOROLAC TROMETHAMINE 30 MG/ML IJ SOLN: 30.0000 mg | Freq: Once | INTRAMUSCULAR | Status: DC

## 2016-01-03 MED ORDER — KETOROLAC TROMETHAMINE 30 MG/ML IJ SOLN
INTRAMUSCULAR | Status: AC
  Administered 2016-01-03: 30 mg via INTRAVENOUS
  Filled 2016-01-03: qty 1

## 2016-01-03 MED ORDER — DEXTROSE 5 % IV SOLN: 250.0000 mL | Freq: Once | INTRAVENOUS | Status: DC

## 2016-01-03 MED ORDER — POTASSIUM CHLORIDE CRYS ER 20 MEQ PO TBCR
40.0000 meq | EXTENDED_RELEASE_TABLET | Freq: Once | ORAL | Status: AC
  Administered 2016-01-03: 40 meq via ORAL
  Filled 2016-01-03: qty 2

## 2016-01-03 NOTE — Anesthesia Preprocedure Evaluation (Signed)
Anesthesia Evaluation  Patient identified by MRN, date of birth, ID band Patient awake    Reviewed: Allergy & Precautions, NPO status , Patient's Chart, lab work & pertinent test results  History of Anesthesia Complications Negative for: history of anesthetic complications  Airway Mallampati: II  TM Distance: >3 FB Neck ROM: Full    Dental no notable dental hx.    Pulmonary neg COPD, PE (hx of PE, on anticoagulation)   breath sounds clear to auscultation- rhonchi (-) wheezing      Cardiovascular hypertension, Pt. on medications (-) angina+ Peripheral Vascular Disease  (-) CAD and (-) Past MI  Rhythm:Regular Rate:Normal - Systolic murmurs and - Diastolic murmurs    Neuro/Psych PSYCHIATRIC DISORDERS Bipolar Disorder negative neurological ROS     GI/Hepatic negative GI ROS, Neg liver ROS,   Endo/Other  diabetes, Well Controlled, Insulin Dependent  Renal/GU CRFRenal disease (baseline Cr 1.6)     Musculoskeletal negative musculoskeletal ROS (+)   Abdominal (+) + obese,   Peds  Hematology negative hematology ROS (+)   Anesthesia Other Findings Past Medical History: No date: Bipolar 1 disorder ([facility]) No date: CKD (chronic kidney disease), stage III No date: Diabetes mellitus without complication ([facility]) No date: DVT (deep venous thrombosis) ([facility]) No date: Hypercholesteremia No date: Hypertension   Reproductive/Obstetrics                             Anesthesia Physical  Anesthesia Plan  ASA: III  Anesthesia Plan: General   Post-op Pain Management:    Induction: Intravenous  Airway Management Planned: Mask  Additional Equipment:   Intra-op Plan: Delibrate Circulatory arrest per surgeon request  Post-operative Plan:   Informed Consent: I have reviewed the patients History and Physical, chart, labs and discussed the procedure including the risks, benefits and alternatives for the  proposed anesthesia with the patient or authorized representative who has indicated his/her understanding and acceptance.     Plan Discussed with: Anesthesiologist and CRNA  Anesthesia Plan Comments:         Anesthesia Quick Evaluation

## 2016-01-03 NOTE — Progress Notes (Addendum)
[place] at [place] NAME: Cameron [name]    MR#:  [id number]  DATE OF BIRTH:  [birth year]  SUBJECTIVE:  CHIEF COMPLAINT:  No chief complaint on file.  - maniac patient, constantly talking, Sitter at bedside. -Had ECT treatment today. -Right leg continues to be edematous and erythematous.  REVIEW OF SYSTEMS:  Review of Systems  Unable to perform ROS: Psychiatric disorder    DRUG ALLERGIES:   Allergies  Allergen Reactions  . Asa [Aspirin] Other (See Comments)    Patient tolerates LOW DOSE ASPIRIN.    VITALS:  Blood pressure 134/73, pulse 96, temperature 98.6 F (37 C), resp. rate 18, height 6' (1.829 m), weight 131.5 kg (290 lb), SpO2 98 %.  PHYSICAL EXAMINATION:  Physical Exam  GENERAL:  50 y.o.-year-old obese patient sitting in the bed, with no acute distress.  EYES: Pupils equal, round, reactive to light and accommodation. No scleral icterus. Extraocular muscles intact.  HEENT: Head atraumatic, normocephalic. Oropharynx and nasopharynx clear.  NECK:  Supple, no jugular venous distention. No thyroid enlargement, no tenderness.  LUNGS: Normal breath sounds bilaterally, no wheezing, rales,rhonchi or crepitation. No use of accessory muscles of respiration. Decreased bibasilar breath sounds,. CARDIOVASCULAR: S1, S2 normal. No murmurs, rubs, or gallops.  ABDOMEN: Soft, nontender, nondistended. Bowel sounds present. No organomegaly or mass.  EXTREMITIES: 3+ edema of both lower extremities, right leg is more erythematous than the left, superficial skin ulcers present. No cyanosis, or clubbing.  NEUROLOGIC: Cranial nerves II through XII are intact. Muscle strength 5/5 in all extremities. Sensation intact. Gait not checked.  PSYCHIATRIC: The patient is alert and oriented but racing thoughts, no proper conversation.  SKIN: No obvious rash, lesion, or ulcer.    LABORATORY PANEL:   CBC  Recent Labs Lab 01/02/16 0430  WBC 4.1  HGB  11.1*  HCT 32.6*  PLT 246   ------------------------------------------------------------------------------------------------------------------  Chemistries   Recent Labs Lab 01/02/16 0430 01/03/16 0334  NA 138 139  K 3.6 3.4*  CL 101 105  CO2 25 24  GLUCOSE 121* 90  BUN 26* 28*  CREATININE 1.71* 1.76*  CALCIUM 9.0 9.0  AST 109*  --   ALT 91*  --   ALKPHOS 44  --   BILITOT 0.8  --    ------------------------------------------------------------------------------------------------------------------  Cardiac Enzymes No results for input(s): TROPONINI in the last 168 hours. ------------------------------------------------------------------------------------------------------------------  RADIOLOGY:  US Venous Img Lower Unilateral Right  Result Date: 01/01/2016 CLINICAL DATA:  Right lower extremity and pulmonary embolism. Worsening pain and edema. EXAM: RIGHT LOWER EXTREMITY VENOUS DUPLEX ULTRASOUND TECHNIQUE: Doppler venous assessment of the right lower extremity deep venous system was performed, including characterization of spectral flow, compressibility, and phasicity. COMPARISON:  None. FINDINGS: There is complete compressibility of the common femoral, femoral, and popliteal veins. Doppler analysis demonstrates respiratory phasicity and augmentation of flow with calf compression. No obvious superficial vein or calf vein thrombosis. IMPRESSION: No evidence of acute DVT. Electronically Signed   By: Marybelle Killings M.D.   On: 01/01/2016 16:44    EKG:   Orders placed or performed during the hospital encounter of 12/03/15  . EKG 12-Lead  . EKG 12-Lead  . EKG    ASSESSMENT AND PLAN:   50 year old male with past medical history significant for bipolar disorder with refractory mania, CK D stage III, diabetes mellitus, right leg DVT on Coumadin, hypertension presents to the hospital secondary to severe mania. Medical admission secondary to worsening right leg cellulitis.  #  1 right  lower extremity cellulitis-partially secondary to chronic skin changes from his chronic right lower extremity DVT. Also has significant venous insufficiency.  repeat Dopplers do not show any occlusive clot. -He does have 2+ edema, recommended to keep his feet up however patient is always standing up on his feet. -Started on Lasix. Continue vancomycin, added Unasyn. -blood cultures are negative. -Vascular consult for possible Unna boots and also wound care consult - likely discharge tomorrow on oral ABX  #2 bipolar disorder with mania-on pretty high doses of antipsychotics. -Currently receiving ECT treatments. Treatment today. -Appreciate psychiatric consult.  #3 insulin-dependent diabetes mellitus- on Albiglutide pen weekly, Invokana, Lantus and SSI  #4 Right leg chronic DVT- continue warfarin, INR therapeutic  #5 HTN- on enalapril  Sitter present Vascular consult to consider Louretta [facility] [employer] Likely discharge to Behavioural medicine tomorrow    All the records are reviewed and case discussed with Care Management/Social Workerr. Management plans discussed with the patient, family and they are in agreement.  CODE STATUS: Full Code  TOTAL TIME TAKING CARE OF THIS PATIENT: 37 minutes.   POSSIBLE D/C IN 1-2 DAYS, DEPENDING ON CLINICAL CONDITION.   Gladstone Lighter M.D on 01/03/2016 at 2:02 PM  Between 7am to 6pm - Pager - [phone]  After 6pm go to [email or web address] - password [email or web address] [facility]  Office  [phone]  CC: Primary care physician; [name], MD

## 2016-01-03 NOTE — Progress Notes (Signed)
1000 Dr Weber Cooks in to see pt.

## 2016-01-03 NOTE — Anesthesia Postprocedure Evaluation (Signed)
Anesthesia Post Note  Patient: Cameron [name]  Procedure(s) Performed: * No procedures listed *  Patient location during evaluation: PACU Anesthesia Type: General Level of consciousness: awake and alert Pain management: pain level controlled Vital Signs Assessment: post-procedure vital signs reviewed and stable Respiratory status: spontaneous breathing, nonlabored ventilation and respiratory function stable Cardiovascular status: blood pressure returned to baseline and stable Postop Assessment: no signs of nausea or vomiting Anesthetic complications: no    Last Vitals:  Vitals:   01/03/16 1049 01/03/16 1110  BP: 129/75   Pulse: (!) 105 98  Resp: (!) 31 17  Temp:  36.6 C    Last Pain:  Vitals:   01/03/16 1039  TempSrc:   PainSc: Asleep                 Anvitha Hutmacher

## 2016-01-03 NOTE — Progress Notes (Signed)
B.S. Was low, 4 ounces of OJ given per protocol.

## 2016-01-03 NOTE — Progress Notes (Signed)
Per MD, Wait to apply unna boots until after Vascular has evaluated legs.

## 2016-01-03 NOTE — Transfer of Care (Signed)
Immediate Anesthesia Transfer of Care Note  Patient: Cameron [name]  Procedure(s) Performed: * No procedures listed *  Patient Location: PACU  Anesthesia Type:General  Level of Consciousness: awake and alert   Airway & Oxygen Therapy: Patient Spontanous Breathing and Patient connected to face mask oxygen  Post-op Assessment: Report given to RN and Post -op Vital signs reviewed and stable  Post vital signs: Reviewed and stable  Last Vitals:  Vitals:   01/03/16 0446 01/03/16 0912  BP: 109/73 123/73  Pulse: 90 98  Resp: 20 18  Temp: 36.6 C 37 C    Last Pain:  Vitals:   01/03/16 0912  TempSrc: Oral  PainSc:       Patients Stated Pain Goal: 0 ([id number])  Complications: No apparent anesthesia complications

## 2016-01-03 NOTE — Consult Note (Signed)
[facility] Vascular Consult Note  MRN : [id number]  Cameron [name] is a 50 y.o. (12-01-1965) male who presents with chief complaint of pain and right leg.  History of Present Illness: The patient is a 50 y.o. 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 He is followed by psychiatry and getting ECT treatments for bipolar disorder with mania. Patient is on Coumadin for a history of DVT with pulmonary emboli and INR is at 2.73 today.  the medical service was consulted for right lower extremity cellulitis identified following and ECT treatment. Patient reported right leg redness and pain. However, this evening the patient is unintelligible stringing words together which make absently no sense. He does not answer questions appropriately. He is incapable of providing any of his own history.  Current Facility-Administered Medications  Medication Dose Route Frequency Provider Last Rate Last Dose  . 0.9 %  sodium chloride infusion   Intravenous Continuous Amy Penwarden, MD      . Albiglutide PEN 50 mg  50 mg Subcutaneous Q7 days Ramonita Lab, MD   50 mg at 01/01/16 1833  . Ampicillin-Sulbactam (UNASYN) 3 g in sodium chloride 0.9 % 100 mL IVPB  3 g Intravenous Q6H Enid Baas, MD   3 g at 01/03/16 2148  . ARIPiprazole (ABILIFY) tablet 30 mg  30 mg Oral QPM Ramonita Lab, MD   30 mg at 01/03/16 1721  . atorvastatin (LIPITOR) tablet 40 mg  40 mg Oral Daily Ramonita Lab, MD   40 mg at 01/03/16 1238  . carbamazepine (TEGRETOL) tablet 400 mg  400 mg Oral q morning - 10a Ramonita Lab, MD   400 mg at 01/03/16 1237  . carbamazepine (TEGRETOL) tablet 600 mg  600 mg Oral QPM Ramonita Lab, MD   600 mg at 01/03/16 1721  . docusate sodium (COLACE) capsule 100 mg  100 mg Oral BID Ramonita Lab, MD   100 mg at 01/03/16 2149  . enalapril (VASOTEC) tablet 5 mg  5  mg Oral BID Ramonita Lab, MD   5 mg at 01/03/16 2149  . fenofibrate tablet 160 mg  160 mg Oral Daily Ramonita Lab, MD   160 mg at 01/03/16 1238  . furosemide (LASIX) tablet 40 mg  40 mg Oral Daily Enid Baas, MD   40 mg at 01/03/16 1238  . insulin aspart (novoLOG) injection 0-5 Units  0-5 Units Subcutaneous QHS Aruna Gouru, MD      . insulin aspart (novoLOG) injection 0-9 Units  0-9 Units Subcutaneous TID WC Ramonita Lab, MD   1 Units at 01/02/16 1300  . insulin glargine (LANTUS) injection 60 Units  60 Units Subcutaneous Daily Ramonita Lab, MD   60 Units at 01/02/16 0952  . ketorolac (TORADOL) 30 MG/ML injection 30 mg  30 mg Intravenous Once Audery Amel, MD      . lamoTRIgine (LAMICTAL) tablet 400 mg  400 mg Oral QHS Ramonita Lab, MD   400 mg at 01/03/16 2148  . multivitamin with minerals tablet 1 tablet  1 tablet Oral Daily Ramonita Lab, MD   1 tablet at 01/03/16 1238  . ondansetron (ZOFRAN) tablet 4 mg  4 mg Oral Q6H PRN Ramonita Lab, MD       Or  . ondansetron (ZOFRAN) injection 4 mg  4 mg Intravenous Q6H PRN Ramonita Lab, MD      . QUEtiapine (SEROQUEL)  tablet 1,200 mg  1,200 mg Oral QHS Gonzella Lex, MD   1,200 mg at 01/03/16 2149  . sodium chloride flush (NS) 0.9 % injection 3 mL  3 mL Intravenous Q12H Nicholes Mango, MD   3 mL at 01/03/16 2149  . temazepam (RESTORIL) capsule 15 mg  15 mg Oral QHS PRN Nicholes Mango, MD      . warfarin (COUMADIN) tablet 7.5 mg  7.5 mg Oral q1800 Gladstone Lighter, MD   7.5 mg at 01/03/16 1721  . Warfarin - Pharmacist Dosing Inpatient   Does not apply [id number] Nicholes Mango, MD        Past Medical History:  Diagnosis Date  . Bipolar 1 disorder ([place])   . CKD (chronic kidney disease), stage III   . Diabetes mellitus without complication ([place])   . DVT (deep venous thrombosis) ([place])   . Hypercholesteremia   . Hypertension     Past Surgical History:  Procedure Laterality Date  . APPENDECTOMY      Social History Social History  Substance Use Topics  .  Smoking status: Never Smoker  . Smokeless tobacco: Not on file  . Alcohol use Yes     Comment: rarely    Family History Family History  Problem Relation Age of Onset  . Stroke Other   . Diabetes Other   No family history of bleeding clotting disorders porphyria or autoimmune disease  Allergies  Allergen Reactions  . Asa [Aspirin] Other (See Comments)    Patient tolerates LOW DOSE ASPIRIN.     REVIEW OF SYSTEMS (at the time of my evaluation he is unable to yield any information regarding his health)  Constitutional: [] Weight loss  [] Fever  [] Chills Cardiac: [] Chest pain   [] Chest pressure   [] Palpitations   [] Shortness of breath when laying flat   [] Shortness of breath at rest   [] Shortness of breath with exertion. Vascular:  [] Pain in legs with walking   [] Pain in legs at rest   [] Pain in legs when laying flat   [] Claudication   [] Pain in feet when walking  [] Pain in feet at rest  [] Pain in feet when laying flat   [] History of DVT   [] Phlebitis   [] Swelling in legs   [] Varicose veins   [] Non-healing ulcers Pulmonary:   [] Uses home oxygen   [] Productive cough   [] Hemoptysis   [] Wheeze  [] COPD   [] Asthma Neurologic:  [] Dizziness  [] Blackouts   [] Seizures   [] History of stroke   [] History of TIA  [] Aphasia   [] Temporary blindness   [] Dysphagia   [] Weakness or numbness in arms   [] Weakness or numbness in legs Musculoskeletal:  [] Arthritis   [] Joint swelling   [] Joint pain   [] Low back pain Hematologic:  [] Easy bruising  [] Easy bleeding   [] Hypercoagulable state   [] Anemic  [] Hepatitis Gastrointestinal:  [] Blood in stool   [] Vomiting blood  [] Gastroesophageal reflux/heartburn   [] Difficulty swallowing. Genitourinary:  [] Chronic kidney disease   [] Difficult urination  [] Frequent urination  [] Burning with urination   [] Blood in urine Skin:  [] Rashes   [] Ulcers   [] Wounds Psychological:  [] History of anxiety   []  History of major depression.    Physical Examination  Vitals:   01/03/16  1049 01/03/16 1110 01/03/16 1124 01/03/16 2112  BP: 129/75  134/73 119/64  Pulse: (!) 105 98 96 96  Resp: (!) 31 17 18 20   Temp:  97.8 F (36.6 C) 98.6 F (37 C) 98.9 F (37.2 C)  TempSrc:  Oral  SpO2: 96% 100% 98% 98%  Weight:      Height:       Body mass index is 39.33 kg/m.  Head: [place]/AT, No temporalis wasting.  Ear/Nose/Throat: Nares w/o erythema or drainage, oropharynx w/o obsrtuction Eyes: PERRLA, Sclera nonicteric.  Neck: Supple, no nuchal rigidity.    Pulmonary:  Breath sounds equal bilaterally, no use of accessory muscles.  Cardiac: RRR, normal S1, S2, no Murmurs, rubs or gallops. Vascular: Both lower extremities show marked lymphedema with moderate to severe venous stasis changes there is a half dollar sized dry ulcer on the left there are several small ulcers on the right. There is marked erythema to the level of the knee on the right consistent with cellulitis Gastrointestinal: soft, non-tender, non-distended.  Musculoskeletal: Moves all extremities.  No deformity or atrophy. No edema. Neurologic: CN 2-12 intact. Symmetrical.  Speech is fluent.  Psychiatric: Judgment intact, Mood & affect appropriate for pt's clinical situation. Dermatologic: cellulitis with open wounds. Lymph : No Cervical,  or Inguinal lymphadenopathy.      CBC Lab Results  Component Value Date   WBC 4.1 01/02/2016   HGB 11.1 (L) 01/02/2016   HCT 32.6 (L) 01/02/2016   MCV 84.4 01/02/2016   PLT 246 01/02/2016    BMET    Component Value Date/Time   NA 139 01/03/2016 0334   K 3.4 (L) 01/03/2016 0334   CL 105 01/03/2016 0334   CO2 24 01/03/2016 0334   GLUCOSE 90 01/03/2016 0334   BUN 28 (H) 01/03/2016 0334   CREATININE 1.76 (H) 01/03/2016 0334   CALCIUM 9.0 01/03/2016 0334   GFRNONAA 43 (L) 01/03/2016 0334   GFRAA 50 (L) 01/03/2016 0334   Estimated Creatinine Clearance: 69.7 mL/min (by C-G formula based on SCr of 1.76 mg/dL).  COAG Lab Results  Component Value Date   INR 2.74  01/03/2016   INR 3.13 01/02/2016   INR 2.73 01/01/2016     Assessment/Plan # Right lower extremity cellulitis Patient is admitted to [facility] floor He is on vancomycin IV, pharmacy to dose Given his ulcers I understand the thought pattern for Una wraps and I believe this would be an appropriate therapy except for his clinical condition. It seems quite obvious he will not tolerate the wraps likely to just remove them not to mention that the extraordinary difficulty would be to apply them in his current condition. His ulcers are dry there is no need for debridement. I would favor continued treatment of his psychosis and when this is improved and he is in a more cooperative state Unna boots be placed.  #Right lower extremity DVT Continue Coumadin. INR is therapeutic. Pharmacy to dose  #History of bipolar disorder with mania Consults psychiatry to continue ECT treatments Dr. Weber Cooks is following  #Insulin-dependent diabetic mellitus Continue Lantus and sliding scale insulin. Provide diabetic diet  #Chronic kidney disease stage III Continue close monitoring of renal function    Schnier, Dolores Lory, MD  01/03/2016 10:34 PM

## 2016-01-03 NOTE — Progress Notes (Signed)
Pt dumped out pockets and he had a lighter that shoots out a torch-like flame, toe nail clippers that has a knife, and a key with another metal object. Objects were secured by the safety sitter and locked up in [facility] office. Pt's wife was called, but no return call has been made.

## 2016-01-03 NOTE — Consult Note (Signed)
[place] Psychiatry Consult   Reason for Consult:  Follow-up consult for 50 year old man with bipolar disorder and cellulitis Referring Physician:  Gouru Patient Identification: Cameron [name] MRN:  [id number] Principal Diagnosis: Bipolar I disorder, current or most recent episode manic, with psychotic features [facility]) Diagnosis:   Patient Active Problem List   Diagnosis Date Noted  . Cellulitis of leg, right [L03.115] 01/01/2016  . Bipolar I disorder, current or most recent episode manic, with psychotic features ([place]) [F31.2] 12/11/2015  . Pulmonary embolism ([place]) [I26.99] 12/04/2015  . Diabetes mellitus without complication ([place]) [Q33.0]   . Hypercholesteremia [E78.00]   . CKD (chronic kidney disease), stage III [N18.3]   . Acute deep vein thrombosis (DVT) of femoral vein of right lower extremity (H[facility]) [I82.411]     Total Time spent with patient: 20 minutes  Subjective:   Cameron [name] is a 50 y.o. male patient admitted with "I'm feeling great".  Follow-up Wednesday, August 2. Patient actually appears to be getting worse in terms of his mania. It was impossible to have a conversation with him this evening. He was having such constant flight of ideas and pressured speech that he could not answer even a basic question. Thoughts very disorganized. Almost agitated at some points. HPI:  Follow-up for this 50 year old man who had been on the psychiatric ward until this morning. He had cellulitis which appeared to be getting worse and has been transferred to the medical service. Patient interviewed and chart reviewed. Patient was cooperative and pleasant. He says he is feeling great. Denies any acute pain. Denies feeling sick. His thoughts are still disorganized tangential with lots of flight of ideas but there is no threatening behavior no psychosis and he is basically cooperative with treatment.  Past Psychiatric History: Long history of bipolar disorder current episode manic has  been going on for over a month. Slowly responding to ECT along with mood stabilizing medicine no history of suicide attempts  Risk to Self: Is patient at risk for suicide?: Yes Risk to Others:   Prior Inpatient Therapy:   Prior Outpatient Therapy:    Past Medical History:  Past Medical History:  Diagnosis Date  . Bipolar 1 disorder ([place])   . CKD (chronic kidney disease), stage III   . Diabetes mellitus without complication ([place])   . DVT (deep venous thrombosis) ([place])   . Hypercholesteremia   . Hypertension     Past Surgical History:  Procedure Laterality Date  . APPENDECTOMY     Family History:  Family History  Problem Relation Age of Onset  . Stroke Other   . Diabetes Other    Family Psychiatric  History: Positive for bipolar disorder Social History:  History  Alcohol Use  . Yes    Comment: rarely     History  Drug Use No    Social History   Social History  . Marital status: Married    Spouse name: N/A  . Number of children: N/A  . Years of education: N/A   Social History Main Topics  . Smoking status: Never Smoker  . Smokeless tobacco: None  . Alcohol use Yes     Comment: rarely  . Drug use: No  . Sexual activity: Not Asked   Other Topics Concern  . None   Social History Narrative  . None   Additional Social History:    Allergies:   Allergies  Allergen Reactions  . Asa [Aspirin] Other (See Comments)    Patient tolerates LOW DOSE  ASPIRIN.    Labs:  Results for orders placed or performed during the hospital encounter of 01/01/16 (from the past 48 hour(s))  Basic metabolic panel     Status: Abnormal   Collection Time: 01/01/16  8:25 PM  Result Value Ref Range   Sodium 138 135 - 145 mmol/L   Potassium 3.6 3.5 - 5.1 mmol/L   Chloride 102 101 - 111 mmol/L   CO2 27 22 - 32 mmol/L   Glucose, Bld 119 (H) 65 - 99 mg/dL   BUN 24 (H) 6 - 20 mg/dL   Creatinine, Ser 1.73 (H) 0.61 - 1.24 mg/dL   Calcium 9.0 8.9 - 10.3 mg/dL   GFR calc non Af Amer  44 (L) >60 mL/min   GFR calc Af Amer 51 (L) >60 mL/min    Comment: (NOTE) The eGFR has been calculated using the CKD EPI equation. This calculation has not been validated in all clinical situations. eGFR's persistently <60 mL/min signify possible Chronic Kidney Disease.    Anion gap 9 5 - 15  Glucose, capillary     Status: Abnormal   Collection Time: 01/01/16  9:34 PM  Result Value Ref Range   Glucose-Capillary 105 (H) 65 - 99 mg/dL  Comprehensive metabolic panel     Status: Abnormal   Collection Time: 01/02/16  4:30 AM  Result Value Ref Range   Sodium 138 135 - 145 mmol/L   Potassium 3.6 3.5 - 5.1 mmol/L   Chloride 101 101 - 111 mmol/L   CO2 25 22 - 32 mmol/L   Glucose, Bld 121 (H) 65 - 99 mg/dL   BUN 26 (H) 6 - 20 mg/dL   Creatinine, Ser 1.71 (H) 0.61 - 1.24 mg/dL   Calcium 9.0 8.9 - 10.3 mg/dL   Total Protein 7.3 6.5 - 8.1 g/dL   Albumin 3.7 3.5 - 5.0 g/dL   AST 109 (H) 15 - 41 U/L   ALT 91 (H) 17 - 63 U/L   Alkaline Phosphatase 44 38 - 126 U/L   Total Bilirubin 0.8 0.3 - 1.2 mg/dL   GFR calc non Af Amer 45 (L) >60 mL/min   GFR calc Af Amer 52 (L) >60 mL/min    Comment: (NOTE) The eGFR has been calculated using the CKD EPI equation. This calculation has not been validated in all clinical situations. eGFR's persistently <60 mL/min signify possible Chronic Kidney Disease.    Anion gap 12 5 - 15  CBC     Status: Abnormal   Collection Time: 01/02/16  4:30 AM  Result Value Ref Range   WBC 4.1 3.8 - 10.6 K/uL   RBC 3.87 (L) 4.40 - 5.90 MIL/uL   Hemoglobin 11.1 (L) 13.0 - 18.0 g/dL   HCT 32.6 (L) 40.0 - 52.0 %   MCV 84.4 80.0 - 100.0 fL   MCH 28.6 26.0 - 34.0 pg   MCHC 33.9 32.0 - 36.0 g/dL   RDW 12.6 11.5 - 14.5 %   Platelets 246 150 - 440 K/uL  Hemoglobin A1c     Status: Abnormal   Collection Time: 01/02/16  4:30 AM  Result Value Ref Range   Hgb A1c MFr Bld 7.4 (H) 4.0 - 6.0 %  Protime-INR     Status: Abnormal   Collection Time: 01/02/16  4:34 AM  Result Value  Ref Range   Prothrombin Time 32.9 (H) 11.4 - 15.2 seconds   INR 3.13   Glucose, capillary     Status: Abnormal   Collection Time:  01/02/16  7:31 AM  Result Value Ref Range   Glucose-Capillary 123 (H) 65 - 99 mg/dL  Glucose, capillary     Status: Abnormal   Collection Time: 01/02/16 11:27 AM  Result Value Ref Range   Glucose-Capillary 132 (H) 65 - 99 mg/dL  Glucose, capillary     Status: Abnormal   Collection Time: 01/02/16  4:32 PM  Result Value Ref Range   Glucose-Capillary 106 (H) 65 - 99 mg/dL  Glucose, capillary     Status: Abnormal   Collection Time: 01/02/16  9:14 PM  Result Value Ref Range   Glucose-Capillary 103 (H) 65 - 99 mg/dL  Protime-INR     Status: Abnormal   Collection Time: 01/03/16  3:34 AM  Result Value Ref Range   Prothrombin Time 29.6 (H) 11.4 - 15.2 seconds   INR 5.57   Basic metabolic panel     Status: Abnormal   Collection Time: 01/03/16  3:34 AM  Result Value Ref Range   Sodium 139 135 - 145 mmol/L   Potassium 3.4 (L) 3.5 - 5.1 mmol/L   Chloride 105 101 - 111 mmol/L   CO2 24 22 - 32 mmol/L   Glucose, Bld 90 65 - 99 mg/dL   BUN 28 (H) 6 - 20 mg/dL   Creatinine, Ser 1.76 (H) 0.61 - 1.24 mg/dL   Calcium 9.0 8.9 - 10.3 mg/dL   GFR calc non Af Amer 43 (L) >60 mL/min   GFR calc Af Amer 50 (L) >60 mL/min    Comment: (NOTE) The eGFR has been calculated using the CKD EPI equation. This calculation has not been validated in all clinical situations. eGFR's persistently <60 mL/min signify possible Chronic Kidney Disease.    Anion gap 10 5 - 15  Glucose, capillary     Status: None   Collection Time: 01/03/16  7:51 AM  Result Value Ref Range   Glucose-Capillary 82 65 - 99 mg/dL   Comment 1 Notify RN   Glucose, capillary     Status: Abnormal   Collection Time: 01/03/16 11:26 AM  Result Value Ref Range   Glucose-Capillary 60 (L) 65 - 99 mg/dL   Comment 1 Notify RN   Glucose, capillary     Status: None   Collection Time: 01/03/16 11:53 AM  Result Value  Ref Range   Glucose-Capillary 67 65 - 99 mg/dL  Glucose, capillary     Status: None   Collection Time: 01/03/16 12:14 PM  Result Value Ref Range   Glucose-Capillary 83 65 - 99 mg/dL  Glucose, capillary     Status: None   Collection Time: 01/03/16  4:28 PM  Result Value Ref Range   Glucose-Capillary 86 65 - 99 mg/dL    Current Facility-Administered Medications  Medication Dose Route Frequency Provider Last Rate Last Dose  . 0.9 %  sodium chloride infusion   Intravenous Continuous Amy Penwarden, MD      . Albiglutide PEN 50 mg  50 mg Subcutaneous Q7 days Nicholes Mango, MD   50 mg at 01/01/16 1833  . Ampicillin-Sulbactam (UNASYN) 3 g in sodium chloride 0.9 % 100 mL IVPB  3 g Intravenous Q6H Gladstone Lighter, MD   3 g at 01/03/16 1407  . ARIPiprazole (ABILIFY) tablet 30 mg  30 mg Oral QPM Nicholes Mango, MD   30 mg at 01/03/16 1721  . atorvastatin (LIPITOR) tablet 40 mg  40 mg Oral Daily Nicholes Mango, MD   40 mg at 01/03/16 1238  . carbamazepine (TEGRETOL) tablet  400 mg  400 mg Oral q morning - 10a Nicholes Mango, MD   400 mg at 01/03/16 1237  . carbamazepine (TEGRETOL) tablet 600 mg  600 mg Oral QPM Nicholes Mango, MD   600 mg at 01/03/16 1721  . docusate sodium (COLACE) capsule 100 mg  100 mg Oral BID Nicholes Mango, MD   100 mg at 01/03/16 1238  . enalapril (VASOTEC) tablet 5 mg  5 mg Oral BID Nicholes Mango, MD   5 mg at 01/03/16 1655  . fenofibrate tablet 160 mg  160 mg Oral Daily Nicholes Mango, MD   160 mg at 01/03/16 1238  . furosemide (LASIX) tablet 40 mg  40 mg Oral Daily Gladstone Lighter, MD   40 mg at 01/03/16 1238  . insulin aspart (novoLOG) injection 0-5 Units  0-5 Units Subcutaneous QHS Aruna Gouru, MD      . insulin aspart (novoLOG) injection 0-9 Units  0-9 Units Subcutaneous TID WC Nicholes Mango, MD   1 Units at 01/02/16 1300  . insulin glargine (LANTUS) injection 60 Units  60 Units Subcutaneous Daily Nicholes Mango, MD   60 Units at 01/02/16 0952  . ketorolac (TORADOL) 30 MG/ML injection 30 mg  30  mg Intravenous Once Gonzella Lex, MD      . lamoTRIgine (LAMICTAL) tablet 400 mg  400 mg Oral QHS Nicholes Mango, MD   400 mg at 01/02/16 2157  . multivitamin with minerals tablet 1 tablet  1 tablet Oral Daily Nicholes Mango, MD   1 tablet at 01/03/16 1238  . ondansetron (ZOFRAN) tablet 4 mg  4 mg Oral Q6H PRN Nicholes Mango, MD       Or  . ondansetron (ZOFRAN) injection 4 mg  4 mg Intravenous Q6H PRN Nicholes Mango, MD      . QUEtiapine (SEROQUEL) tablet 1,200 mg  1,200 mg Oral QHS Gonzella Lex, MD   1,200 mg at 01/02/16 2158  . sodium chloride flush (NS) 0.9 % injection 3 mL  3 mL Intravenous Q12H Nicholes Mango, MD   3 mL at 01/03/16 1000  . temazepam (RESTORIL) capsule 15 mg  15 mg Oral QHS PRN Nicholes Mango, MD      . warfarin (COUMADIN) tablet 7.5 mg  7.5 mg Oral q1800 Gladstone Lighter, MD   7.5 mg at 01/03/16 1721  . Warfarin - Pharmacist Dosing Inpatient   Does not apply q1800 Nicholes Mango, MD        Musculoskeletal: Strength & Muscle Tone: within normal limits Gait & Station: normal Patient leans: N/A  Psychiatric Specialty Exam: Physical Exam  Nursing note and vitals reviewed. Constitutional: He appears well-developed and well-nourished.  HENT:  Head: Normocephalic and atraumatic.  Eyes: Conjunctivae are normal. Pupils are equal, round, and reactive to light.  Neck: Normal range of motion.  Cardiovascular: Normal heart sounds.   Respiratory: Effort normal.  GI: Soft.  Musculoskeletal: Normal range of motion.  Neurological: He is alert.  Skin: Skin is warm and dry.     Psychiatric: His affect is labile. His speech is tangential. He is hyperactive. Thought content is not paranoid. Cognition and memory are normal. He expresses impulsivity. He expresses no suicidal ideation.    Review of Systems  Constitutional: Negative.   HENT: Negative.   Eyes: Negative.   Respiratory: Negative.   Cardiovascular: Negative.   Gastrointestinal: Negative.   Musculoskeletal: Negative.   Skin:  Negative.   Neurological: Negative.   Psychiatric/Behavioral: Negative for depression, hallucinations, memory loss, substance abuse and  suicidal ideas. The patient has insomnia. The patient is not nervous/anxious.     Blood pressure 134/73, pulse 96, temperature 98.6 F (37 C), resp. rate 18, height 6' (1.829 m), weight 131.5 kg (290 lb), SpO2 98 %.Body mass index is 39.33 kg/m.  General Appearance: Casual  Eye Contact:  Good  Speech:  Pressured  Volume:  Increased  Mood:  Euphoric  Affect:  Labile  Thought Process:  Disorganized  Orientation:  Full (Time, Place, and Person)  Thought Content:  Tangential  Suicidal Thoughts:  No  Homicidal Thoughts:  No  Memory:  Immediate;   Fair Recent;   Fair Remote;   Fair  Judgement:  Impaired  Insight:  Shallow  Psychomotor Activity:  Increased and Restlessness  Concentration:  Concentration: Fair  Recall:  [employer] of Knowledge:  Fair  Language:  Fair  Akathisia:  No  Handed:  Right  AIMS (if indicated):     Assets:  Communication Skills Desire for Improvement Financial Resources/Insurance Housing Resilience Social Support  ADL's:  Intact  Cognition:  WNL  Sleep:        Treatment Plan Summary: Daily contact with patient to assess and evaluate symptoms and progress in treatment, Medication management and Plan ECT treatment today went fine. In fact his seizure was of such good quality made me suspicious about his Tegretol dosing. I ordered a Tegretol level this morning to be done stat but it was not done at all. I'll reorder it for tomorrow morning. I have already increased his Seroquel dose last night. I'm hoping that we can get him back down to psychiatry sooner rather than later so that we can more completely address his mania. Meanwhile continue ECT treatment.  Disposition: Recommend psychiatric Inpatient admission when medically cleared. Supportive therapy provided about ongoing stressors.  Alethia Berthold, MD 01/03/2016 7:11  PM

## 2016-01-03 NOTE — Progress Notes (Signed)
Pt was very talkative today not able to connect with conversation and had flights of ideas.

## 2016-01-03 NOTE — H&P (Signed)
Cameron [name] is an 50 y.o. male.   Chief Complaint: Patient has no specific new complaint. Remains hyperverbal disorganized tangential possibly even worse. HPI: Patient with bipolar disorder manic receiving ECT as well as medication management  Past Medical History:  Diagnosis Date  . Bipolar 1 disorder ([place])   . CKD (chronic kidney disease), stage III   . Diabetes mellitus without complication ([place])   . DVT (deep venous thrombosis) ([place])   . Hypercholesteremia   . Hypertension     Past Surgical History:  Procedure Laterality Date  . APPENDECTOMY      Family History  Problem Relation Age of Onset  . Stroke Other   . Diabetes Other    Social History:  reports that he has never smoked. He does not have any smokeless tobacco history on file. He reports that he drinks alcohol. He reports that he does not use drugs.  Allergies:  Allergies  Allergen Reactions  . Asa [Aspirin] Other (See Comments)    Patient tolerates LOW DOSE ASPIRIN.    Medications Prior to Admission  Medication Sig Dispense Refill  . enalapril (VASOTEC) 5 MG tablet Take 5 mg by mouth 2 (two) times daily.    . ARIPiprazole (ABILIFY) 30 MG tablet Take 30 mg by mouth every evening.     Marland Kitchen atorvastatin (LIPITOR) 40 MG tablet Take 40 mg by mouth daily.  11  . Canagliflozin (INVOKANA) 100 MG TABS Take 100 mg by mouth daily with breakfast.     . carbamazepine (TEGRETOL) 200 MG tablet Take 400-600 mg by mouth 2 (two) times daily. Take 2 tablets in the morning Take 3 tablets in the evening    . enoxaparin (LOVENOX) 150 MG/ML injection Inject 1 mL (150 mg total) into the skin every 12 (twelve) hours. 8 Syringe 0  . fenofibrate 160 MG tablet Take 160 mg by mouth daily.    . Insulin Glargine (LANTUS SOLOSTAR) 100 UNIT/ML Solostar Pen Inject 60 Units into the skin daily.     Marland Kitchen lamoTRIgine (LAMICTAL) 100 MG tablet Take 400 mg by mouth at bedtime.    . Multiple Vitamin (MULTIVITAMIN WITH MINERALS) TABS tablet Take 1  tablet by mouth daily.    . QUEtiapine (SEROQUEL) 400 MG tablet Take 800 mg by mouth at bedtime.    Marland Kitchen TANZEUM 50 MG PEN Inject 50 mg into the skin every 7 (seven) days.  2  . temazepam (RESTORIL) 15 MG capsule Take 15 mg by mouth at bedtime as needed for sleep.    Marland Kitchen warfarin (COUMADIN) 5 MG tablet Take 2 tablets (10 mg total) by mouth daily. 60 tablet 0    Results for orders placed or performed during the hospital encounter of 01/01/16 (from the past 48 hour(s))  CBC WITH DIFFERENTIAL     Status: Abnormal   Collection Time: 01/01/16  3:45 PM  Result Value Ref Range   WBC 4.9 3.8 - 10.6 K/uL   RBC 3.93 (L) 4.40 - 5.90 MIL/uL   Hemoglobin 11.7 (L) 13.0 - 18.0 g/dL   HCT 33.1 (L) 40.0 - 52.0 %   MCV 84.1 80.0 - 100.0 fL   MCH 29.6 26.0 - 34.0 pg   MCHC 35.3 32.0 - 36.0 g/dL   RDW 12.5 11.5 - 14.5 %   Platelets 238 150 - 440 K/uL   Neutrophils Relative % 71 %   Neutro Abs 3.4 1.4 - 6.5 K/uL   Lymphocytes Relative 16 %   Lymphs Abs 0.8 (L) 1.0 -  3.6 K/uL   Monocytes Relative 13 %   Monocytes Absolute 0.6 0.2 - 1.0 K/uL   Eosinophils Relative 0 %   Eosinophils Absolute 0.0 0 - 0.7 K/uL   Basophils Relative 0 %   Basophils Absolute 0.0 0 - 0.1 K/uL  Glucose, capillary     Status: Abnormal   Collection Time: 01/01/16  4:50 PM  Result Value Ref Range   Glucose-Capillary 117 (H) 65 - 99 mg/dL  Basic metabolic panel     Status: Abnormal   Collection Time: 01/01/16  8:25 PM  Result Value Ref Range   Sodium 138 135 - 145 mmol/L   Potassium 3.6 3.5 - 5.1 mmol/L   Chloride 102 101 - 111 mmol/L   CO2 27 22 - 32 mmol/L   Glucose, Bld 119 (H) 65 - 99 mg/dL   BUN 24 (H) 6 - 20 mg/dL   Creatinine, Ser 1.73 (H) 0.61 - 1.24 mg/dL   Calcium 9.0 8.9 - 10.3 mg/dL   GFR calc non Af Amer 44 (L) >60 mL/min   GFR calc Af Amer 51 (L) >60 mL/min    Comment: (NOTE) The eGFR has been calculated using the CKD EPI equation. This calculation has not been validated in all clinical situations. eGFR's  persistently <60 mL/min signify possible Chronic Kidney Disease.    Anion gap 9 5 - 15  Glucose, capillary     Status: Abnormal   Collection Time: 01/01/16  9:34 PM  Result Value Ref Range   Glucose-Capillary 105 (H) 65 - 99 mg/dL  Comprehensive metabolic panel     Status: Abnormal   Collection Time: 01/02/16  4:30 AM  Result Value Ref Range   Sodium 138 135 - 145 mmol/L   Potassium 3.6 3.5 - 5.1 mmol/L   Chloride 101 101 - 111 mmol/L   CO2 25 22 - 32 mmol/L   Glucose, Bld 121 (H) 65 - 99 mg/dL   BUN 26 (H) 6 - 20 mg/dL   Creatinine, Ser 1.71 (H) 0.61 - 1.24 mg/dL   Calcium 9.0 8.9 - 10.3 mg/dL   Total Protein 7.3 6.5 - 8.1 g/dL   Albumin 3.7 3.5 - 5.0 g/dL   AST 109 (H) 15 - 41 U/L   ALT 91 (H) 17 - 63 U/L   Alkaline Phosphatase 44 38 - 126 U/L   Total Bilirubin 0.8 0.3 - 1.2 mg/dL   GFR calc non Af Amer 45 (L) >60 mL/min   GFR calc Af Amer 52 (L) >60 mL/min    Comment: (NOTE) The eGFR has been calculated using the CKD EPI equation. This calculation has not been validated in all clinical situations. eGFR's persistently <60 mL/min signify possible Chronic Kidney Disease.    Anion gap 12 5 - 15  CBC     Status: Abnormal   Collection Time: 01/02/16  4:30 AM  Result Value Ref Range   WBC 4.1 3.8 - 10.6 K/uL   RBC 3.87 (L) 4.40 - 5.90 MIL/uL   Hemoglobin 11.1 (L) 13.0 - 18.0 g/dL   HCT 32.6 (L) 40.0 - 52.0 %   MCV 84.4 80.0 - 100.0 fL   MCH 28.6 26.0 - 34.0 pg   MCHC 33.9 32.0 - 36.0 g/dL   RDW 12.6 11.5 - 14.5 %   Platelets 246 150 - 440 K/uL  Hemoglobin A1c     Status: Abnormal   Collection Time: 01/02/16  4:30 AM  Result Value Ref Range   Hgb A1c MFr Bld 7.4 (  H) 4.0 - 6.0 %  Protime-INR     Status: Abnormal   Collection Time: 01/02/16  4:34 AM  Result Value Ref Range   Prothrombin Time 32.9 (H) 11.4 - 15.2 seconds   INR 3.13   Glucose, capillary     Status: Abnormal   Collection Time: 01/02/16  7:31 AM  Result Value Ref Range   Glucose-Capillary 123 (H) 65 -  99 mg/dL  Glucose, capillary     Status: Abnormal   Collection Time: 01/02/16 11:27 AM  Result Value Ref Range   Glucose-Capillary 132 (H) 65 - 99 mg/dL  Glucose, capillary     Status: Abnormal   Collection Time: 01/02/16  4:32 PM  Result Value Ref Range   Glucose-Capillary 106 (H) 65 - 99 mg/dL  Glucose, capillary     Status: Abnormal   Collection Time: 01/02/16  9:14 PM  Result Value Ref Range   Glucose-Capillary 103 (H) 65 - 99 mg/dL  Protime-INR     Status: Abnormal   Collection Time: 01/03/16  3:34 AM  Result Value Ref Range   Prothrombin Time 29.6 (H) 11.4 - 15.2 seconds   INR 9.41   Basic metabolic panel     Status: Abnormal   Collection Time: 01/03/16  3:34 AM  Result Value Ref Range   Sodium 139 135 - 145 mmol/L   Potassium 3.4 (L) 3.5 - 5.1 mmol/L   Chloride 105 101 - 111 mmol/L   CO2 24 22 - 32 mmol/L   Glucose, Bld 90 65 - 99 mg/dL   BUN 28 (H) 6 - 20 mg/dL   Creatinine, Ser 1.76 (H) 0.61 - 1.24 mg/dL   Calcium 9.0 8.9 - 10.3 mg/dL   GFR calc non Af Amer 43 (L) >60 mL/min   GFR calc Af Amer 50 (L) >60 mL/min    Comment: (NOTE) The eGFR has been calculated using the CKD EPI equation. This calculation has not been validated in all clinical situations. eGFR's persistently <60 mL/min signify possible Chronic Kidney Disease.    Anion gap 10 5 - 15  Glucose, capillary     Status: None   Collection Time: 01/03/16  7:51 AM  Result Value Ref Range   Glucose-Capillary 82 65 - 99 mg/dL   Comment 1 Notify RN    US Venous Img Lower Unilateral Right  Result Date: 01/01/2016 CLINICAL DATA:  Right lower extremity and pulmonary embolism. Worsening pain and edema. EXAM: RIGHT LOWER EXTREMITY VENOUS DUPLEX ULTRASOUND TECHNIQUE: Doppler venous assessment of the right lower extremity deep venous system was performed, including characterization of spectral flow, compressibility, and phasicity. COMPARISON:  None. FINDINGS: There is complete compressibility of the common femoral,  femoral, and popliteal veins. Doppler analysis demonstrates respiratory phasicity and augmentation of flow with calf compression. No obvious superficial vein or calf vein thrombosis. IMPRESSION: No evidence of acute DVT. Electronically Signed   By: Marybelle Killings M.D.   On: 01/01/2016 16:44    Review of Systems  Constitutional: Negative.   HENT: Negative.   Eyes: Negative.   Respiratory: Negative.   Cardiovascular: Negative.   Gastrointestinal: Negative.   Musculoskeletal: Negative.   Skin: Negative.   Neurological: Negative.   Psychiatric/Behavioral: Negative for depression, hallucinations, memory loss, substance abuse and suicidal ideas. The patient has insomnia. The patient is not nervous/anxious.     Blood pressure 123/73, pulse 98, temperature 98.6 F (37 C), temperature source Oral, resp. rate 18, height 6' (1.829 m), weight 131.5 kg (290 lb), SpO2 100 %.  Physical Exam  Nursing note and vitals reviewed. Constitutional: He appears well-developed and well-nourished.  HENT:  Head: Normocephalic and atraumatic.  Eyes: Conjunctivae are normal. Pupils are equal, round, and reactive to light.  Neck: Normal range of motion.  Cardiovascular: Normal heart sounds.   Respiratory: Effort normal.  GI: Soft.  Musculoskeletal: Normal range of motion.  Neurological: He is alert.  Skin: Skin is warm and dry.     Psychiatric: His affect is labile and inappropriate. His speech is rapid and/or pressured and tangential. He is agitated and hyperactive. Thought content is delusional. He expresses impulsivity. He expresses no suicidal ideation. He exhibits abnormal recent memory and abnormal remote memory.     Assessment/Plan Patient is tentatively agreed to 8 treatments we are continuing on with treatment at all trying to maximize medication.  Alethia Berthold, MD 01/03/2016, 10:22 AM

## 2016-01-03 NOTE — Procedures (Signed)
ECT SERVICES Physician's Interval Evaluation & Treatment Note  Patient Identification: Cameron [name] MRN:  [id number] Date of Evaluation:  01/03/2016 TX #: [id number]  MADRS:   MMSE:   P.E. Findings:  Erythematous lower extremities right worse than left. Sores appear to be healing a little bit.  Psychiatric Interval Note:  Manic hyperverbal hyperactive grandiose agitated not violent however.  Subjective:  Patient is a 50 y.o. male seen for evaluation for Electroconvulsive Therapy. No specific complaint  Treatment Summary:   []   Right Unilateral             [x]  Bilateral   % Energy : 1.0 ms 100%   Impedance: 710 ohms  Seizure Energy Index: 5352 V squared  Postictal Suppression Index: 93%  Seizure Concordance Index: 98%  Medications  Pre Shock: Toradol 30 mg, Brevital 100 mg, succinylcholine 150 mg  Post Shock:    Seizure Duration: 30 seconds by EMG, 46 seconds by EEG   Comments: Seizure quality was much better than on previous treatments which makes me slightly suspicious about his anticonvulsant medication. I will get a Tegretol level checked today as soon as possible. Next treatment scheduled Friday   Lungs:  [x]   Clear to auscultation               []  Other:   Heart:    [x]   Regular rhythm             []  irregular rhythm    [x]   Previous H&P reviewed, patient examined and there are NO CHANGES                 []   Previous H&P reviewed, patient examined and there are changes noted.   Alethia Berthold, MD 8/2/201710:24 AM

## 2016-01-03 NOTE — Progress Notes (Signed)
Pt  to ECT via bed  from rm 224.  Right leg cont to be extremely red, swollen and weeping clear fluid.  Pt verbalized no pain from leg.  Dr.  Randa Lynn in to see pt.

## 2016-01-03 NOTE — Progress Notes (Signed)
ANTICOAGULATION CONSULT NOTE - Follow Up Consult  Pharmacy Consult for Warfarin Indication: VTE treatment  Allergies  Allergen Reactions  . Asa [Aspirin] Other (See Comments)    Patient tolerates LOW DOSE ASPIRIN.    Patient Measurements: Height: 6' (182.9 cm) Weight: 299 lb (135.6 kg) IBW/kg (Calculated) : 77.6  Vital Signs: Temp: 97.9 F (36.6 C) (08/02 0446) Temp Source: Oral (08/02 0446) BP: 109/73 (08/02 0446) Pulse Rate: 90 (08/02 0446)  Labs:  Recent Labs  01/01/16 0659 01/01/16 1545 01/01/16 2025 01/02/16 0430 01/02/16 0434 01/03/16 0334  HGB 11.8* 11.7*  --  11.1*  --   --   HCT 33.3* 33.1*  --  32.6*  --   --   PLT 220 238  --  246  --   --   LABPROT 29.5*  --   --   --  32.9* 29.6*  INR 2.73  --   --   --  3.13 2.74  CREATININE  --   --  1.73* 1.71*  --  1.76*   Estimated Creatinine Clearance: 70.8 mL/min (by C-G formula based on SCr of 1.76 mg/dL).  Medical History: Past Medical History:  Diagnosis Date  . Bipolar 1 disorder ([place])   . CKD (chronic kidney disease), stage III   . Diabetes mellitus without complication ([place])   . DVT (deep venous thrombosis) ([place])   . Hypercholesteremia   . Hypertension    Medications:  Warfarin 10 mg po daily  Assessment: 71 yom admitted to ED [facility] with manic symptoms. [facility] with DVT/PE, had been bridging LMWH and VKA prior to admission.  INR History: 7/9: INR - 1.99 7/10: no INR- warfarin 12.5 mg po daily at 0200 7/11: INR - 2.97- warfarin held 7/12: INR - 1.92 - warfarin 10 mg 7/13: INR - 1.95 - warfarin 10 mg 7/14: INR - 2.56 - warfarin 5 mg 7/15: INR - 2.73 - warfarin 5 mg 7/16: INR - 2.39 - warfarin 7.5 mg 7/17: INR - 2.22 - warfarin 7.5 mg 7/18: INR - 2.53 - warfarin 7.5 mg 7/19: INR - 2.75 - warfarin 7.5 mg 7/20: INR - 2.99 - warfarin 6.5 mg 7/21: INR - 2.64 - warfarin 6.5 mg  7/22: INR: 2.44; warfarin 8  7/23: INR: 2.60;  Warfarin 10mg  7/24: INR 2.44 - warfarin 10mg  7/25:   INR 3.22 - warfarin 10mg  7/26:  INR 3.08 - warfarin 8mg  7/27:  INR 3.24 - warfarin 8 mg 7/28:  INR 2.79 - 8 mg 7/29:  INR 2.74- 8 mg 7/30:  INR 3.19 - 7 mg 7/31:  INR 2.73  7.5mg  8/1:    INR 3.13  7.5mg  8/2:    INR 2.74  Goal of Therapy:  INR 2-3 Monitor platelets by anticoagulation protocol: Yes   Plan:  INR is therapeutic today. Continue 7.5mg  daily and follow up with INR in the AM  (Patient with orders for carbamazepine which interacts with warfarin to decrease the INR.  Will follow INR closely.)   Pt will need CBC q3 days per policy.   Pharmacy will continue to follow.   Glo Herring Clinical Pharmacist  01/03/2016,7:23 AM

## 2016-01-03 NOTE — Consult Note (Addendum)
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  Began as a bed bug infestation in the home.  Patient is noncompliant with keeping Un[employer] boots in place.  (Less than one day).  History of DVT with vascular consult pending.  Wound type:Infectious Pressure Ulcer Left anterior pretibial leg with 2 cm x 2 x 0.2 cm nonintact lesion, scattered nonintact lesions, weeping serosanguinous drainage. Right anterior leg 2 cm x 1 cm x 0.1 cm nonintact lesionatarsal  POA: N/A  Wound [id number] and moist  Drainage (amount, consistency, odor) serous weeping Periwound:Erythema and edema Dressing procedure/placement/frequency:  Please wait to wrap once vascular has evaluated lower extremities. Cleanse bilateral lower legs with soap and water.  Pat dry.  Apply Aquacel Ag to nonintact wounds.  Wrap legs with zinc layer from just below toes to below knee.  Secure with [place].  Change weekly.  On discharge, please instruct patient to leave in place until next week. Will not follow at this time.  Please re-consult if needed.  Domenic Moras RN BSN [place] Pager [phone]

## 2016-01-04 ENCOUNTER — Inpatient Hospital Stay
Admission: AD | Admit: 2016-01-04 | Discharge: 2016-02-01 | DRG: 885 | Disposition: A | Discharge status: Home / Self Care | Payer: 59 | Source: Intra-hospital | Attending: Psychiatry | Admitting: Psychiatry

## 2016-01-04 ENCOUNTER — Other Ambulatory Visit: Payer: Self-pay | Admitting: Psychiatry

## 2016-01-04 DIAGNOSIS — I129 Hypertensive chronic kidney disease with stage 1 through stage 4 chronic kidney disease, or unspecified chronic kidney disease: Secondary | ICD-10-CM | POA: Diagnosis present

## 2016-01-04 DIAGNOSIS — Z7901 Long term (current) use of anticoagulants: Secondary | ICD-10-CM | POA: Diagnosis not present

## 2016-01-04 DIAGNOSIS — L03115 Cellulitis of right lower limb: Secondary | ICD-10-CM | POA: Diagnosis present

## 2016-01-04 DIAGNOSIS — F419 Anxiety disorder, unspecified: Secondary | ICD-10-CM | POA: Diagnosis present

## 2016-01-04 DIAGNOSIS — Z86718 Personal history of other venous thrombosis and embolism: Secondary | ICD-10-CM

## 2016-01-04 DIAGNOSIS — Z9049 Acquired absence of other specified parts of digestive tract: Secondary | ICD-10-CM | POA: Diagnosis not present

## 2016-01-04 DIAGNOSIS — E7800 Pure hypercholesterolemia, unspecified: Secondary | ICD-10-CM | POA: Diagnosis present

## 2016-01-04 DIAGNOSIS — Z79899 Other long term (current) drug therapy: Secondary | ICD-10-CM

## 2016-01-04 DIAGNOSIS — E1122 Type 2 diabetes mellitus with diabetic chronic kidney disease: Secondary | ICD-10-CM | POA: Diagnosis present

## 2016-01-04 DIAGNOSIS — Z794 Long term (current) use of insulin: Secondary | ICD-10-CM | POA: Diagnosis not present

## 2016-01-04 DIAGNOSIS — N183 Chronic kidney disease, stage 3 unspecified: Secondary | ICD-10-CM | POA: Diagnosis present

## 2016-01-04 DIAGNOSIS — Z833 Family history of diabetes mellitus: Secondary | ICD-10-CM

## 2016-01-04 DIAGNOSIS — Z86711 Personal history of pulmonary embolism: Secondary | ICD-10-CM

## 2016-01-04 DIAGNOSIS — F312 Bipolar disorder, current episode manic severe with psychotic features: Secondary | ICD-10-CM | POA: Diagnosis present

## 2016-01-04 DIAGNOSIS — Z823 Family history of stroke: Secondary | ICD-10-CM | POA: Diagnosis not present

## 2016-01-04 DIAGNOSIS — Z886 Allergy status to analgesic agent status: Secondary | ICD-10-CM

## 2016-01-04 DIAGNOSIS — F3110 Bipolar disorder, current episode manic without psychotic features, unspecified: Secondary | ICD-10-CM | POA: Diagnosis present

## 2016-01-04 DIAGNOSIS — E119 Type 2 diabetes mellitus without complications: Secondary | ICD-10-CM

## 2016-01-04 DIAGNOSIS — G4700 Insomnia, unspecified: Secondary | ICD-10-CM | POA: Diagnosis present

## 2016-01-04 LAB — GLUCOSE, CAPILLARY
GLUCOSE-CAPILLARY: 94 mg/dL (ref 65–99)
GLUCOSE-CAPILLARY: 144 mg/dL — AB (ref 65–99)
Glucose-Capillary: 109 mg/dL — ABNORMAL HIGH (ref 65–99)

## 2016-01-04 LAB — BASIC METABOLIC PANEL
Anion gap: 6 (ref 5–15)
BUN: 26 mg/dL — AB (ref 6–20)
CALCIUM: 8.9 mg/dL (ref 8.9–10.3)
CHLORIDE: 109 mmol/L (ref 101–111)
CO2: 28 mmol/L (ref 22–32)
Creatinine, Ser: 1.6 mg/dL — ABNORMAL HIGH (ref 0.61–1.24)
GFR calc Af Amer: 56 mL/min — ABNORMAL LOW (ref >60)
GFR, EST NON AFRICAN AMERICAN: 48 mL/min — AB (ref >60)
GLUCOSE: 90 mg/dL (ref 65–99)
Potassium: 4.2 mmol/L (ref 3.5–5.1)
Sodium: 143 mmol/L (ref 135–145)

## 2016-01-04 LAB — PROTIME-INR
INR: 2.27
PROTHROMBIN TIME: 25.4 s — AB (ref 11.4–15.2)

## 2016-01-04 LAB — CARBAMAZEPINE LEVEL, TOTAL: CARBAMAZEPINE LVL: 2.9 ug/mL — AB (ref 4.0–12.0)

## 2016-01-04 LAB — CARBAMAZEPINE, FREE AND TOTAL
CARBAMAZEPINE FREE: 1.9 ug/mL (ref 0.6–4.2)
Carbamazepine, Total: 5.9 ug/mL (ref 4.0–12.0)

## 2016-01-04 MED ORDER — MAGNESIUM HYDROXIDE 400 MG/5ML PO SUSP: 30.0000 mL | Freq: Every day | ORAL | Status: DC | PRN

## 2016-01-04 MED ORDER — HALOPERIDOL LACTATE 5 MG/ML IJ SOLN
20.0000 mg | Freq: Once | INTRAMUSCULAR | Status: AC
  Administered 2016-01-04: 20 mg via INTRAMUSCULAR
  Filled 2016-01-04: qty 4

## 2016-01-04 MED ORDER — LORAZEPAM 2 MG/ML IJ SOLN
2.0000 mg | Freq: Once | INTRAMUSCULAR | Status: AC
  Administered 2016-01-04: 2 mg via INTRAMUSCULAR
  Filled 2016-01-04: qty 1

## 2016-01-04 MED ORDER — AMOXICILLIN-POT CLAVULANATE 875-125 MG PO TABS
1.0000 | ORAL_TABLET | Freq: Two times a day (BID) | ORAL | Status: AC
  Administered 2016-01-04 – 2016-01-11 (×14): 1 via ORAL
  Filled 2016-01-04 (×15): qty 1

## 2016-01-04 MED ORDER — INSULIN GLARGINE 100 UNIT/ML ~~LOC~~ SOLN
60.0000 [IU] | Freq: Every day | SUBCUTANEOUS | Status: DC
  Administered 2016-01-04 – 2016-01-13 (×8): 60 [IU] via SUBCUTANEOUS
  Filled 2016-01-04 (×11): qty 0.6

## 2016-01-04 MED ORDER — CARBAMAZEPINE 200 MG PO TABS
400.0000 mg | ORAL_TABLET | Freq: Every morning | ORAL | 0 refills | Status: DC
Start: 2016-01-04 — End: 2016-02-01

## 2016-01-04 MED ORDER — ATORVASTATIN CALCIUM 20 MG PO TABS
40.0000 mg | ORAL_TABLET | Freq: Every day | ORAL | Status: DC
  Administered 2016-01-04 – 2016-02-01 (×28): 40 mg via ORAL
  Filled 2016-01-04 (×29): qty 2

## 2016-01-04 MED ORDER — FENOFIBRATE 160 MG PO TABS
160.0000 mg | ORAL_TABLET | Freq: Every day | ORAL | Status: DC
  Administered 2016-01-04 – 2016-02-01 (×29): 160 mg via ORAL
  Filled 2016-01-04 (×29): qty 1

## 2016-01-04 MED ORDER — ALUM & MAG HYDROXIDE-SIMETH 200-200-20 MG/5ML PO SUSP
30.0000 mL | ORAL | Status: DC | PRN
Start: 2016-01-04 — End: 2016-02-01
  Filled 2016-01-04 (×2): qty 30

## 2016-01-04 MED ORDER — CARBAMAZEPINE 200 MG PO TABS
1200.0000 mg | ORAL_TABLET | Freq: Two times a day (BID) | ORAL | Status: DC
  Administered 2016-01-05 – 2016-02-01 (×54): 1200 mg via ORAL
  Filled 2016-01-04 (×54): qty 6

## 2016-01-04 MED ORDER — CARBAMAZEPINE 200 MG PO TABS
600.0000 mg | ORAL_TABLET | Freq: Two times a day (BID) | ORAL | Status: DC
  Administered 2016-01-04: 600 mg via ORAL
  Filled 2016-01-04: qty 3

## 2016-01-04 MED ORDER — QUETIAPINE FUMARATE 400 MG PO TABS: 1200.0000 mg | ORAL_TABLET | Freq: Every day | ORAL | 0 refills | Status: DC

## 2016-01-04 MED ORDER — ACETAMINOPHEN 325 MG PO TABS
650.0000 mg | ORAL_TABLET | Freq: Four times a day (QID) | ORAL | Status: DC | PRN
  Administered 2016-01-06 – 2016-01-17 (×8): 650 mg via ORAL
  Filled 2016-01-04 (×9): qty 2

## 2016-01-04 MED ORDER — TEMAZEPAM 15 MG PO CAPS
15.0000 mg | ORAL_CAPSULE | Freq: Every day | ORAL | Status: DC
  Administered 2016-01-04 – 2016-01-31 (×28): 15 mg via ORAL
  Filled 2016-01-04 (×28): qty 1

## 2016-01-04 MED ORDER — INSULIN ASPART 100 UNIT/ML ~~LOC~~ SOLN: 0.0000 [IU] | Freq: Three times a day (TID) | SUBCUTANEOUS | 11 refills | Status: DC

## 2016-01-04 MED ORDER — WARFARIN - PHYSICIAN DOSING INPATIENT
Freq: Every day | Status: DC
  Administered 2016-01-04: 18:00:00

## 2016-01-04 MED ORDER — QUETIAPINE FUMARATE 200 MG PO TABS
1200.0000 mg | ORAL_TABLET | Freq: Every day | ORAL | Status: DC
  Administered 2016-01-04 – 2016-01-11 (×8): 1200 mg via ORAL
  Filled 2016-01-04 (×9): qty 6

## 2016-01-04 MED ORDER — DEXTROSE 5 % IV SOLN
250.0000 mL | Freq: Once | INTRAVENOUS | Status: AC
  Administered 2016-01-15: 250 mL via INTRAVENOUS

## 2016-01-04 MED ORDER — INSULIN ASPART 100 UNIT/ML ~~LOC~~ SOLN: 0.0000 [IU] | Freq: Every day | SUBCUTANEOUS | 11 refills | Status: DC

## 2016-01-04 MED ORDER — LAMOTRIGINE 100 MG PO TABS
200.0000 mg | ORAL_TABLET | Freq: Every day | ORAL | Status: DC
  Administered 2016-01-04 – 2016-02-01 (×29): 200 mg via ORAL
  Filled 2016-01-04 (×29): qty 2

## 2016-01-04 MED ORDER — CARBAMAZEPINE 200 MG PO TABS: 600.0000 mg | ORAL_TABLET | Freq: Every evening | ORAL | 0 refills | Status: DC

## 2016-01-04 MED ORDER — ENALAPRIL MALEATE 5 MG PO TABS
5.0000 mg | ORAL_TABLET | Freq: Two times a day (BID) | ORAL | Status: DC
  Administered 2016-01-04 – 2016-01-31 (×54): 5 mg via ORAL
  Filled 2016-01-04 (×57): qty 1

## 2016-01-04 MED ORDER — WARFARIN SODIUM 2.5 MG PO TABS
7.5000 mg | ORAL_TABLET | Freq: Every day | ORAL | Status: DC
  Administered 2016-01-04: 7.5 mg via ORAL
  Filled 2016-01-04: qty 3

## 2016-01-04 MED ORDER — WARFARIN SODIUM 7.5 MG PO TABS: 7.5000 mg | ORAL_TABLET | Freq: Every day | ORAL | 0 refills | Status: DC

## 2016-01-04 MED ORDER — BENZTROPINE MESYLATE 1 MG/ML IJ SOLN
1.0000 mg | Freq: Once | INTRAMUSCULAR | Status: AC
  Administered 2016-01-04: 1 mg via INTRAMUSCULAR
  Filled 2016-01-04: qty 1

## 2016-01-04 MED ORDER — AMOXICILLIN-POT CLAVULANATE 875-125 MG PO TABS: 1.0000 | ORAL_TABLET | Freq: Two times a day (BID) | ORAL | 0 refills | Status: DC

## 2016-01-04 NOTE — [facility] Group Notes (Signed)
Goals Group  Date/Time: 01/04/2016 9am  Type of Therapy and Topic: Group Therapy: Goals Group: SMART Goals   Pt was called, but did not attend   [name]. Vanice Rappa, LCSWA, LCAS

## 2016-01-04 NOTE — Progress Notes (Signed)
Pt's belongings are with sitter at bedside. Belongings were verified by pt's wife that they are his. Belongings include, a lighter, toenail clippers with a knife, a key, and a miniature device that acts like a screwdriver. Security officer advised me to keep belongings in pt's bag where they came from and that behavioral health would keep his belongings locked up.

## 2016-01-04 NOTE — [facility] Group Notes (Signed)
Pt presented as too acute to attend group, as evidenced by the pt the pt not being able to focus, stop talking, or ambulating up and down hallways.  Cameron [name]. [name], LCSWA, LCAS   01/04/16

## 2016-01-04 NOTE — H&P (Signed)
Psychiatric Admission Assessment Adult  Patient Identification: Cameron [name] MRN:  [id number] Date of Evaluation:  01/04/2016 Chief Complaint:  Bipolar Principal Diagnosis: Bipolar I disorder, current or most recent episode manic, with psychotic features [facility]) Diagnosis:   Patient Active Problem List   Diagnosis Date Noted  . Bipolar affective disorder, current episode manic ([place]) [F31.9] 01/04/2016  . Cellulitis of leg, right [L03.115] 01/01/2016  . Bipolar I disorder, current or most recent episode manic, with psychotic features ([place]) [F31.2] 12/11/2015  . Pulmonary embolism ([place]) [I26.99] 12/04/2015  . Diabetes mellitus without complication ([place]) [id number]   . Hypercholesteremia [E78.00]   . CKD (chronic kidney disease), stage III [N18.3]   . Acute deep vein thrombosis (DVT) of femoral vein of right lower extremity (HCC) [I82.411]    History of Present Illness: Patient is being readmitted to the psychiatric ward after a brief stay on medicine. He had been transferred to medicine for treatment of cellulitis with intravenous antibiotics. He is now being brought back to psychiatry after completing IV antibiotics and being transitioned to oral antibiotics. Outside of that the patient continues to be hospitalized because of a manic psychosis. See multiple previous notes Associated Signs/Symptoms: Depression Symptoms:  insomnia, psychomotor agitation, difficulty concentrating, impaired memory, anxiety, (Hypo) Manic Symptoms:  Delusions, Distractibility, Elevated Mood, Flight of Ideas, Grandiosity, Impulsivity, Labiality of Mood, Anxiety Symptoms:  Obsessive Compulsive Symptoms:   None,, Psychotic Symptoms:  Ideas of Reference, PTSD Symptoms: Negative Total Time spent with patient: 1 hour  Past Psychiatric History: Long-standing bipolar disorder that had been stable for years on medication. Recent decompensation possibly related to medicines being discontinued. Now in an extended  manic episode.  Is the patient at risk to self? Yes.    Has the patient been a risk to self in the past 6 months? No.  Has the patient been a risk to self within the distant past? Yes.    Is the patient a risk to others? Yes.    Has the patient been a risk to others in the past 6 months? Yes.    Has the patient been a risk to others within the distant past? Yes.     Prior Inpatient Therapy:   Prior Outpatient Therapy:    Alcohol Screening: 1. How often do you have a drink containing alcohol?: Monthly or less 2. How many drinks containing alcohol do you have on a typical day when you are drinking?: 1 or 2 3. How often do you have six or more drinks on one occasion?: Never Preliminary Score: 0 4. How often during the last year have you found that you were not able to stop drinking once you had started?: Never 5. How often during the last year have you failed to do what was normally expected from you becasue of drinking?: Never 6. How often during the last year have you needed a first drink in the morning to get yourself going after a heavy drinking session?: Never 7. How often during the last year have you had a feeling of guilt of remorse after drinking?: Never 8. How often during the last year have you been unable to remember what happened the night before because you had been drinking?: Never 9. Have you or someone else been injured as a result of your drinking?: No 10. Has a relative or friend or a doctor or another health worker been concerned about your drinking or suggested you cut down?: No Alcohol Use Disorder Identification Test Final Score (AUDIT): 1 Substance  Abuse History in the last 12 months:  No. Consequences of Substance Abuse: Negative Previous Psychotropic Medications: Yes  Psychological Evaluations: Yes  Past Medical History:  Past Medical History:  Diagnosis Date  . Bipolar 1 disorder ([place])   . CKD (chronic kidney disease), stage III   . Diabetes mellitus without  complication ([place])   . DVT (deep venous thrombosis) ([place])   . Hypercholesteremia   . Hypertension     Past Surgical History:  Procedure Laterality Date  . APPENDECTOMY     Family History:  Family History  Problem Relation Age of Onset  . Stroke Other   . Diabetes Other    Family Psychiatric  History: Positive for bipolar disorder Tobacco Screening: Have you used any form of tobacco in the last 30 days? (Cigarettes, Smokeless Tobacco, Cigars, and/or Pipes): No Social History:  History  Alcohol Use  . Yes    Comment: rarely     History  Drug Use No    Additional Social History:                           Allergies:   Allergies  Allergen Reactions  . Asa [Aspirin] Other (See Comments)    Patient tolerates LOW DOSE ASPIRIN.   Lab Results:  Results for orders placed or performed during the hospital encounter of 01/04/16 (from the past 48 hour(s))  Carbamazepine level, total     Status: Abnormal   Collection Time: 01/04/16  3:58 PM  Result Value Ref Range   Carbamazepine Lvl 2.9 (L) 4.0 - 12.0 ug/mL  Glucose, capillary     Status: None   Collection Time: 01/04/16  4:39 PM  Result Value Ref Range   Glucose-Capillary 94 65 - 99 mg/dL    Blood Alcohol level:  Lab Results  Component Value Date   ETH <5 [id number]    Metabolic Disorder Labs:  Lab Results  Component Value Date   HGBA1C 7.4 (H) 01/02/2016   MPG 192 12/04/2015   MPG 318 (H) 08/24/2010   Lab Results  Component Value Date   PROLACTIN 4.4 12/11/2015   Lab Results  Component Value Date   CHOL 143 12/11/2015   TRIG 286 (H) 12/11/2015   HDL 32 (L) 12/11/2015   CHOLHDL 4.5 12/11/2015   VLDL 57 (H) 12/11/2015   LDLCALC 54 12/11/2015   LDLCALC 56 12/05/2015    Current Medications: Current Facility-Administered Medications  Medication Dose Route Frequency Provider Last Rate Last Dose  . acetaminophen (TYLENOL) tablet 650 mg  650 mg Oral Q6H PRN Gonzella Lex, MD      . alum & mag  hydroxide-simeth (MAALOX/MYLANTA) 200-200-20 MG/5ML suspension 30 mL  30 mL Oral Q4H PRN Gonzella Lex, MD      . amoxicillin-clavulanate (AUGMENTIN) 875-125 MG per tablet 1 tablet  1 tablet Oral Q12H Gonzella Lex, MD      . atorvastatin (LIPITOR) tablet 40 mg  40 mg Oral q1800 Gonzella Lex, MD   40 mg at 01/04/16 1808  . [START ON 01/05/2016] carbamazepine (TEGRETOL) tablet 1,200 mg  1,200 mg Oral BID PC Jayshaun Phillips T Vinod Mikesell, MD      . dextrose 5 % solution 250 mL  250 mL Intravenous Once Gonzella Lex, MD      . enalapril (VASOTEC) tablet 5 mg  5 mg Oral BID Gonzella Lex, MD      . fenofibrate tablet 160 mg  160 mg Oral  Daily Gonzella Lex, MD   160 mg at 01/04/16 1808  . insulin glargine (LANTUS) injection 60 Units  60 Units Subcutaneous QHS Gonzella Lex, MD      . lamoTRIgine (LAMICTAL) tablet 200 mg  200 mg Oral Daily Gonzella Lex, MD   200 mg at 01/04/16 1809  . magnesium hydroxide (MILK OF MAGNESIA) suspension 30 mL  30 mL Oral Daily PRN Gonzella Lex, MD      . QUEtiapine (SEROQUEL) tablet 1,200 mg  1,200 mg Oral QHS [place] Ruud T Trase Bunda, MD      . temazepam (RESTORIL) capsule 15 mg  15 mg Oral QHS Gonzella Lex, MD      . warfarin (COUMADIN) tablet 7.5 mg  7.5 mg Oral q1800 Gonzella Lex, MD   7.5 mg at 01/04/16 1810  . Warfarin - Physician Dosing Inpatient   Does not apply [id number] Gonzella Lex, MD       PTA Medications: Prescriptions Prior to Admission  Medication Sig Dispense Refill Last Dose  . amoxicillin-clavulanate (AUGMENTIN) 875-125 MG tablet Take 1 tablet by mouth 2 (two) times daily. X 7 more days 14 tablet 0   . ARIPiprazole (ABILIFY) 30 MG tablet Take 30 mg by mouth every evening.    12/24/2015  . atorvastatin (LIPITOR) 40 MG tablet Take 40 mg by mouth daily.  11 12/24/2015  . carbamazepine (TEGRETOL) 200 MG tablet Take 2 tablets (400 mg total) by mouth every morning. 30 tablet 0   . carbamazepine (TEGRETOL) 200 MG tablet Take 3 tablets (600 mg total) by mouth every  evening. 30 tablet 0   . enalapril (VASOTEC) 5 MG tablet Take 5 mg by mouth 2 (two) times daily.   01/03/2016  . fenofibrate 160 MG tablet Take 160 mg by mouth daily.   12/24/2015  . insulin aspart (NOVOLOG) 100 UNIT/ML injection Inject 0-9 Units into the skin 3 (three) times daily with meals. 10 mL 11   . insulin aspart (NOVOLOG) 100 UNIT/ML injection Inject 0-5 Units into the skin at bedtime. 10 mL 11   . Insulin Glargine (LANTUS SOLOSTAR) 100 UNIT/ML Solostar Pen Inject 60 Units into the skin daily.    12/24/2015  . lamoTRIgine (LAMICTAL) 100 MG tablet Take 400 mg by mouth at bedtime.   12/24/2015  . Multiple Vitamin (MULTIVITAMIN WITH MINERALS) TABS tablet Take 1 tablet by mouth daily.   12/24/2015  . QUEtiapine (SEROQUEL) 400 MG tablet Take 3 tablets (1,200 mg total) by mouth at bedtime. 90 tablet 0   . TANZEUM 50 MG PEN Inject 50 mg into the skin every 7 (seven) days.  2 12/24/2015  . temazepam (RESTORIL) 15 MG capsule Take 15 mg by mouth at bedtime as needed for sleep.   12/24/2015  . warfarin (COUMADIN) 7.5 MG tablet Take 1 tablet (7.5 mg total) by mouth daily at 6 PM. 30 tablet 0     Musculoskeletal: Strength & Muscle Tone: within normal limits Gait & Station: normal Patient leans: N/A  Psychiatric Specialty Exam: Physical Exam  Nursing note and vitals reviewed. Constitutional: He appears well-developed and well-nourished.  HENT:  Head: Normocephalic and atraumatic.  Eyes: Conjunctivae are normal. Pupils are equal, round, and reactive to light.  Neck: Normal range of motion.  Cardiovascular: Regular rhythm and normal heart sounds.   Respiratory: Effort normal. No respiratory distress.  GI: Soft.  Musculoskeletal: Normal range of motion.  Neurological: He is alert.  Skin: Skin is warm and dry.  Psychiatric: His affect is labile and inappropriate. His speech is tangential. He is hyperactive. Thought content is delusional. Cognition and memory are impaired. He expresses  impulsivity. He is noncommunicative.    Review of Systems  Constitutional: Negative.   HENT: Negative.   Eyes: Negative.   Respiratory: Negative.   Cardiovascular: Negative.   Gastrointestinal: Negative.   Musculoskeletal: Negative.   Skin: Negative.   Neurological: Negative.   Psychiatric/Behavioral: Negative for depression, hallucinations, memory loss, substance abuse and suicidal ideas. The patient is nervous/anxious and has insomnia.     Blood pressure 109/76, pulse 100, temperature 97.6 F (36.4 C), temperature source Oral, resp. rate 18, height 6' (1.829 m), weight 131.1 kg (289 lb), SpO2 100 %.Body mass index is 39.2 kg/m.  General Appearance: Casual  Eye Contact:  Good  Speech:  Garbled and Pressured  Volume:  Increased  Mood:  Euphoric  Affect:  Inappropriate and Labile  Thought Process:  Disorganized  Orientation:  Full (Time, Place, and Person)  Thought Content:  Illogical, Ideas of Reference:   Paranoia, Paranoid Ideation and Tangential  Suicidal Thoughts:  No  Homicidal Thoughts:  No  Memory:  Immediate;   Fair Recent;   Fair Remote;   Fair  Judgement:  Fair  Insight:  Fair  Psychomotor Activity:  Increased  Concentration:  Concentration: Fair  Recall:  [employer] of Knowledge:  Fair  Language:  Fair  Akathisia:  No  Handed:  Right  AIMS (if indicated):     Assets:  Communication Skills Desire for Improvement Financial Resources/Insurance Housing Resilience Social Support  ADL's:  Intact  Cognition:  Impaired,  Mild  Sleep:          Treatment Plan Summary: Daily contact with patient to assess and evaluate symptoms and progress in treatment, Medication management and Plan Patient is being continued on medicine for bipolar disorder and is also receiving 3 times a week ECT. Currently on one-to-one because of agitated behavior. Diabetes being managed with insulin and frequent checks of his blood sugar. Now on oral antibiotics for cellulitis. Continue  on anticoagulant medicine.  Observation Level/Precautions:  Continuous Observation  Laboratory:  HbAIC  Psychotherapy:  Daily individual psychotherapy for assessment of ability to engage in treatment   Medications:  Mood stabilizers   Consultations:  Anesthesia and internal medicine   Discharge Concerns:  Stability after discharge   Estimated LOS:14 days   Other:     I certify that inpatient services furnished can reasonably be expected to improve the patient's condition.    Alethia Berthold, MD 8/3/20177:39 PM

## 2016-01-04 NOTE — Plan of Care (Signed)
Problem: Coping: Goal: Ability to verbalize frustrations and anger appropriately will improve Outcome: Not Progressing Unable to focus

## 2016-01-04 NOTE — BH Assessment (Signed)
Patient is to be admitted to [place] by Dr. Weber Cooks.  Attending Physician will be Dr. Dr. Weber Cooks.   Patient has been assigned to room 312, by [facility] Charge Nurse Jake Church.   Intake Paper Work has been signed and placed on patient chart.  Staff is aware of the admission Pryor Montes, Patient's Nurse & Gerhard Perches, Patient Access).

## 2016-01-04 NOTE — Discharge Summary (Signed)
[place] [facility] [place] NAME: Cameron [name]    MR#:  [id number]  DATE OF BIRTH:  05-15-1966  DATE OF ADMISSION:  01/01/2016   ADMITTING PHYSICIAN: Nicholes Mango, MD  DATE OF DISCHARGE: 01/04/2016  PRIMARY CARE PHYSICIAN: [name], MD   ADMISSION DIAGNOSIS:   Cellulitis leg  DISCHARGE DIAGNOSIS:   Principal Problem:   Bipolar I disorder, current or most recent episode manic, with psychotic features ([place]) Active Problems:   Cellulitis of leg, right   SECONDARY DIAGNOSIS:   Past Medical History:  Diagnosis Date  . Bipolar 1 disorder ([place])   . CKD (chronic kidney disease), stage III   . Diabetes mellitus without complication ([place])   . DVT (deep venous thrombosis) ([place])   . Hypercholesteremia   . Hypertension     HOSPITAL COURSE:   50 year old male with past medical history significant for bipolar disorder with refractory mania, CK D stage III, diabetes mellitus, right leg DVT on Coumadin, hypertension presents to the hospital secondary to severe mania. Medical admission secondary to worsening right leg cellulitis.  #1 right lower extremity cellulitis-partially secondary to chronic skin changes from his chronic right lower extremity DVT. Also has significant venous insufficiency.  repeat Dopplers do not show any occlusive clot. -He does have 2-3+ edema, recommended to keep his feet up however patient is always standing up on his feet. - received vancomycin and unasyn with improvement- change to augmentin at discharge - Will benefit from [employer]- however needs to be mentally more cooperative to tolerate them. -Appreciate vascular consult -blood cultures are negative.  #2 bipolar disorder with mania-on pretty high doses of antipsychotics. -Currently receiving ECT treatments. Last Treatment yesterday. -management per psychiatry.  #3 insulin-dependent diabetes mellitus- on Albiglutide pen weekly, Lantus and  SSI Invokana held as renal function borderline  #4 Right leg chronic DVT- continue warfarin, INR therapeutic  #5 HTN- on enalapril  Sitter present here discharge to Behavioural medicine today  DISCHARGE CONDITIONS:   Guarded  CONSULTS OBTAINED:   Treatment Team:  Gonzella Lex, MD Katha Cabal, MD  DRUG ALLERGIES:   Allergies  Allergen Reactions  . Asa [Aspirin] Other (See Comments)    Patient tolerates LOW DOSE ASPIRIN.   DISCHARGE MEDICATIONS:     Medication List    STOP taking these medications   enoxaparin 150 MG/ML injection Commonly known as:  LOVENOX   INVOKANA 100 MG Tabs tablet Generic drug:  canagliflozin     TAKE these medications   amoxicillin-clavulanate 875-125 MG tablet Commonly known as:  AUGMENTIN Take 1 tablet by mouth 2 (two) times daily. X 7 more days   ARIPiprazole 30 MG tablet Commonly known as:  ABILIFY Take 30 mg by mouth every evening.   atorvastatin 40 MG tablet Commonly known as:  LIPITOR Take 40 mg by mouth daily.   carbamazepine 200 MG tablet Commonly known as:  TEGRETOL Take 2 tablets (400 mg total) by mouth every morning. What changed:  how much to take  when to take this  additional instructions   carbamazepine 200 MG tablet Commonly known as:  TEGRETOL Take 3 tablets (600 mg total) by mouth every evening. What changed:  You were already taking a medication with the same name, and this prescription was added. Make sure you understand how and when to take each.   enalapril 5 MG tablet Commonly known as:  VASOTEC Take 5 mg by mouth 2 (two) times daily.   fenofibrate 160  MG tablet Take 160 mg by mouth daily.   insulin aspart 100 UNIT/ML injection Commonly known as:  novoLOG Inject 0-9 Units into the skin 3 (three) times daily with meals.   insulin aspart 100 UNIT/ML injection Commonly known as:  novoLOG Inject 0-5 Units into the skin at bedtime.   lamoTRIgine 100 MG tablet Commonly known as:   LAMICTAL Take 400 mg by mouth at bedtime.   LANTUS SOLOSTAR 100 UNIT/ML Solostar Pen Generic drug:  Insulin Glargine Inject 60 Units into the skin daily.   multivitamin with minerals Tabs tablet Take 1 tablet by mouth daily.   QUEtiapine 400 MG tablet Commonly known as:  SEROQUEL Take 3 tablets (1,200 mg total) by mouth at bedtime. What changed:  how much to take   TANZEUM 50 MG Pen Generic drug:  Albiglutide Inject 50 mg into the skin every 7 (seven) days.   temazepam 15 MG capsule Commonly known as:  RESTORIL Take 15 mg by mouth at bedtime as needed for sleep.   warfarin 7.5 MG tablet Commonly known as:  COUMADIN Take 1 tablet (7.5 mg total) by mouth daily at 6 PM. What changed:  medication strength  how much to take  when to take this        DISCHARGE INSTRUCTIONS:    DIET:   Low sodium low carb diet  ACTIVITY:   As tolerated  OXYGEN:   Home Oxygen: No.  Oxygen Delivery: room air  DISCHARGE LOCATION:   Behavioural Medicine Unit   If you experience worsening of your admission symptoms, develop shortness of breath, life threatening emergency, suicidal or homicidal thoughts you must seek medical attention immediately by calling 911 or calling your MD immediately  if symptoms less severe.  You Must read complete instructions/literature along with all the possible adverse reactions/side effects for all the Medicines you take and that have been prescribed to you. Take any new Medicines after you have completely understood and accpet all the possible adverse reactions/side effects.   Please note  You were cared for by a hospitalist during your hospital stay. If you have any questions about your discharge medications or the care you received while you were in the hospital after you are discharged, you can call the unit and asked to speak with the hospitalist on call if the hospitalist that took care of you is not available. Once you are discharged, your  primary care physician will handle any further medical issues. Please note that NO REFILLS for any discharge medications will be authorized once you are discharged, as it is imperative that you return to your primary care physician (or establish a relationship with a primary care physician if you do not have one) for your aftercare needs so that they can reassess your need for medications and monitor your lab values.    On the day of Discharge:  VITAL SIGNS:   Blood pressure (!) 104/56, pulse 91, temperature 98 F (36.7 C), temperature source Oral, resp. rate 18, height 6' (1.829 m), weight 131.5 kg (290 lb), SpO2 95 %.  PHYSICAL EXAMINATION:    GENERAL:  50 y.o.-year-old obese patient sitting in the bed, with no acute distress. Constantly talking. EYES: Pupils equal, round, reactive to light and accommodation. No scleral icterus. Extraocular muscles intact.  HEENT: Head atraumatic, normocephalic. Oropharynx and nasopharynx clear.  NECK:  Supple, no jugular venous distention. No thyroid enlargement, no tenderness.  LUNGS: Normal breath sounds bilaterally, no wheezing, rales,rhonchi or crepitation. No use of accessory muscles  of respiration. Decreased bibasilar breath sounds,. CARDIOVASCULAR: S1, S2 normal. No murmurs, rubs, or gallops.  ABDOMEN: Soft, nontender, nondistended. Bowel sounds present. No organomegaly or mass.  EXTREMITIES: Improving right leg erythema, 3+ edema of both lower extremities, right leg is more erythematous than the left, superficial skin ulcers present. No cyanosis, or clubbing.  NEUROLOGIC: Cranial nerves II through XII are intact. Muscle strength 5/5 in all extremities. Sensation intact. Gait not checked.  PSYCHIATRIC: The patient is alert and oriented but racing thoughts, no proper conversation.  SKIN: No obvious rash, lesion, or ulcer.   DATA REVIEW:   CBC  Recent Labs Lab 01/02/16 0430  WBC 4.1  HGB 11.1*  HCT 32.6*  PLT 246    Chemistries    Recent Labs Lab 01/02/16 0430  01/04/16 0451  NA 138  < > 143  K 3.6  < > 4.2  CL 101  < > 109  CO2 25  < > 28  GLUCOSE 121*  < > 90  BUN 26*  < > 26*  CREATININE 1.71*  < > 1.60*  CALCIUM 9.0  < > 8.9  AST 109*  --   --   ALT 91*  --   --   ALKPHOS 44  --   --   BILITOT 0.8  --   --   < > = values in this interval not displayed.   Microbiology Results  Results for orders placed or performed during the hospital encounter of 12/03/15  MRSA PCR Screening     Status: None   Collection Time: 12/04/15  5:40 AM  Result Value Ref Range Status   MRSA by PCR NEGATIVE NEGATIVE Final    Comment:        The GeneXpert MRSA Assay (FDA approved for NASAL specimens only), is one component of a comprehensive MRSA colonization surveillance program. It is not intended to diagnose MRSA infection nor to guide or monitor treatment for MRSA infections.     RADIOLOGY:  No results found.   Management plans discussed with the patient, family and they are in agreement.  CODE STATUS:     Code Status Orders        Start     Ordered   01/01/16 1455  Full code  Continuous     01/01/16 1455    Code Status History    Date Active Date Inactive Code Status Order ID Comments User Context   12/11/2015  4:19 PM 01/01/2016  1:22 PM Full Code [id number]  Hildred Priest, MD Inpatient   12/04/2015  5:05 AM 12/07/2015  5:24 PM Full Code [id number]  Ivor Costa, MD ED    Advance Directive Documentation   Flowsheet Row Most Recent Value  Type of Advance Directive  Healthcare Power of Attorney, Living will  Pre-existing out of facility DNR order (yellow form or pink MOST form)  No data  "MOST" Form in Place?  No data      TOTAL TIME TAKING CARE OF THIS PATIENT: 38  minutes.    Gladstone Lighter M.D on 01/04/2016 at 8:32 AM  Between 7am to 6pm - Pager - [phone]  After 6pm go to [email or web address] - Proofreader  [facility]  Office   [phone]  CC: Primary care physician; [name], MD   Note: This dictation was prepared with Dragon dictation along with smaller phrase technology. Any transcriptional errors that result from this process are unintentional.

## 2016-01-04 NOTE — Progress Notes (Signed)
D: Patient pulling down pants in front of  male staff. Staff attempted to redirected. Patient has 1:1

## 2016-01-04 NOTE — Progress Notes (Signed)
D:  27 year ld  white male admitted in under the services of Dr. Weber Cooks . Patient  is  Bipolar and is under going  ECT  Treatment . Patient has cellutis of both lower legs and  Had to be discharge  From unit to medical floor for further treatment.  On return to unit patient is manic  With word salad , flight of ideas. Patient  Confused.D: Pt euphoric     Pt is redirectable and cooperative with assessment.      A: Pt admitted to unit per protocol, skin assessment and search done and no contraband found.  Pt  educated on therapeutic milieu rules. Pt was introduced to milieu by nursing staff.    R: Pt was receptive to education about the milieu .  15 min safety checks started. Probation officer offered support

## 2016-01-04 NOTE — [facility] Group Notes (Signed)
[place] Group Notes:  (Nursing/MHT/Case Management/Adjunct)  Date:  01/04/2016  Time:  3:52 PM  Type of Therapy:  Psychoeducational Skills  Participation Level:  Did Not Attend  Charise Killian 01/04/2016, 3:52 PM

## 2016-01-04 NOTE — BHH Suicide Risk Assessment (Signed)
[facility] Admission Suicide Risk Assessment   Nursing information obtained from:   review with nursing services both on medicine and psychiatric floor and review of chart Demographic factors:   patient is a middle-aged man with bipolar disorder, no active substance abuse, very supportive family and good treatment alliance Current Mental Status:   manic. Agitated euphoric Loss Factors:   none Historical Factors:   no history of suicide attempts Risk Reduction Factors:   patient appears to be genuinely devoted to his family  Total Time spent with patient: 1 hour Principal Problem: <principal problem not specified> Diagnosis:   Patient Active Problem List   Diagnosis Date Noted  . Bipolar affective disorder, current episode manic ([place]on [name]) [F31.9] 01/04/2016  . Cellulitis of leg, right [L03.115] 01/01/2016  . Bipolar I disorder, current or most recent episode manic, with psychotic features ([place]) [F31.2] 12/11/2015  . Pulmonary embolism ([place]) [I26.99] 12/04/2015  . Diabetes mellitus without complication ([place]) [id number]   . Hypercholesteremia [E78.00]   . CKD (chronic kidney disease), stage III [N18.3]   . Acute deep vein thrombosis (DVT) of femoral vein of right lower extremity (HCC) [I82.411]    Subjective Data: Patient is denying any suicidal ideation. In fact he is showing all the symptoms of a typical euphoric manic. Mood is euphoric upbeat agitated very changeable. Not showing any signs of wanting to hurt himself. Fortunately he is currently also not aggressive to anyone else. Thoughts are disorganized and grandiose  Continued Clinical Symptoms:  Alcohol Use Disorder Identification Test Final Score (AUDIT): 1 The "Alcohol Use Disorders Identification Test", Guidelines for Use in Primary Care, Second Edition.  World Pharmacologist [facility]). Score between 0-7:  no or low risk or alcohol related problems. Score between 8-15:  moderate risk of alcohol related problems. Score between 16-19:  high  risk of alcohol related problems. Score 20 or above:  warrants further diagnostic evaluation for alcohol dependence and treatment.   CLINICAL FACTORS:   Bipolar Disorder:   Mixed State   Musculoskeletal: Strength & Muscle Tone: within normal limits Gait & Station: normal Patient leans: N/A  Psychiatric Specialty Exam: Physical Exam  Nursing note and vitals reviewed. Constitutional: He appears well-developed and well-nourished.  HENT:  Head: Normocephalic and atraumatic.  Eyes: Conjunctivae are normal. Pupils are equal, round, and reactive to light.  Neck: Normal range of motion.  Cardiovascular: Regular rhythm and normal heart sounds.   Respiratory: He is in respiratory distress.  GI: Soft.  Musculoskeletal: Normal range of motion.  Neurological: He is alert.  Skin: Skin is warm and dry.     Psychiatric: His affect is labile and inappropriate. His speech is rapid and/or pressured. He is agitated. Thought content is delusional. Cognition and memory are impaired. He expresses impulsivity.    Review of Systems  Constitutional: Negative.   HENT: Negative.   Eyes: Negative.   Respiratory: Negative.   Cardiovascular: Negative.   Gastrointestinal: Negative.   Musculoskeletal: Negative.   Skin: Negative.   Neurological: Negative.   Psychiatric/Behavioral: Negative for depression, hallucinations, memory loss, substance abuse and suicidal ideas. The patient has insomnia. The patient is not nervous/anxious.     Blood pressure 109/76, pulse 100, temperature 97.6 F (36.4 C), temperature source Oral, resp. rate 18, height 6' (1.829 m), weight 131.1 kg (289 lb), SpO2 100 %.Body mass index is 39.2 kg/m.  General Appearance: Casual  Eye Contact:  Fair  Speech:  Garbled and Pressured  Volume:  Increased  Mood:  Euphoric  Affect:  Inappropriate  and Labile  Thought Process:  Disorganized  Orientation:  Full (Time, Place, and Person)  Thought Content:  Illogical  Suicidal Thoughts:   No  Homicidal Thoughts:  No  Memory:  Negative  Judgement:  Fair  Insight:  Fair  Psychomotor Activity:  Increased  Concentration:  Concentration: Poor  Recall:  Poor  Fund of Knowledge:  Poor  Language:  Poor  Akathisia:  No  Handed:  Right  AIMS (if indicated):     Assets:  Desire for Improvement Financial Resources/Insurance Housing Intimacy Social Support  ADL's:  Impaired  Cognition:  Impaired,  Mild  Sleep:         COGNITIVE FEATURES THAT CONTRIBUTE TO RISK:  Loss of executive function    SUICIDE RISK:   Mild:  Suicidal ideation of limited frequency, intensity, duration, and specificity.  There are no identifiable plans, no associated intent, mild dysphoria and related symptoms, good self-control (both objective and subjective assessment), few other risk factors, and identifiable protective factors, including available and accessible social support.   PLAN OF CARE: Patient is being maintained on frequent checks and in fact has one-to-one monitoring on the psychiatric ward. Aggressive treatment for mania. Daily reevaluation of safety with treatment team  I certify that inpatient services furnished can reasonably be expected to improve the patient's condition.  Alethia Berthold, MD 01/04/2016, 7:28 PM

## 2016-01-04 NOTE — Plan of Care (Signed)
Problem: Education: Goal: Knowledge of [facility] Education information/materials will improve Outcome: Not Progressing Patient needs family assistance due to manic behavior.

## 2016-01-04 NOTE — Progress Notes (Signed)
Patient ID: Cameron [name], male   DOB: Nov 09, 1965, 50 y.o.   MRN: [id number] CSW attempted to complete a PSA with the pt, but pt was too acute as evidenced by the pt ambulating nonstop in the hallways, pressured speech and only communicating in an indirect manner with the CSW by referring to the CSW indirectly when speaking to a MHT.                                                                    [name]. Nakima Fluegge, LCSWA, LCAS       01/04/16

## 2016-01-04 NOTE — Progress Notes (Signed)
Patient loud, flight of ideas, rambling, he was medication compliant and un redirectable at shift change. His wife came to visit but when his wife left patient began to try to elope. Extra security called because patient was pushing against the door and threatened to run out . Patient given Injection of Haldol, Ativan and Benztropine with some effect. Patient resting in bed quietly now. At [place] called and stated we do not need them when he is sleep. Security left the unit and gave his extension [phone].

## 2016-01-04 NOTE — Progress Notes (Signed)
ANTICOAGULATION CONSULT NOTE - Follow Up Consult  Pharmacy Consult for Warfarin Indication: VTE treatment  Allergies  Allergen Reactions  . Asa [Aspirin] Other (See Comments)    Patient tolerates LOW DOSE ASPIRIN.    Patient Measurements: Height: 6' (182.9 cm) Weight: 290 lb (131.5 kg) IBW/kg (Calculated) : 77.6  Vital Signs: Temp: 98 F (36.7 C) (08/03 0408) Temp Source: Oral (08/03 0408) BP: 104/56 (08/03 0408) Pulse Rate: 91 (08/03 0408)  Labs:  Recent Labs  01/01/16 1545  01/02/16 0430 01/02/16 0434 01/03/16 0334 01/04/16 0451  HGB 11.7*  --  11.1*  --   --   --   HCT 33.1*  --  32.6*  --   --   --   PLT 238  --  246  --   --   --   LABPROT  --   --   --  32.9* 29.6* 25.4*  INR  --   --   --  3.13 2.74 2.27  CREATININE  --   < > 1.71*  --  1.76* 1.60*  < > = values in this interval not displayed. Estimated Creatinine Clearance: 76.6 mL/min (by C-G formula based on SCr of 1.6 mg/dL).  Medical History: Past Medical History:  Diagnosis Date  . Bipolar 1 disorder ([place])   . CKD (chronic kidney disease), stage III   . Diabetes mellitus without complication ([place])   . DVT (deep venous thrombosis) ([place])   . Hypercholesteremia   . Hypertension    Medications:  Warfarin 10 mg po daily  Assessment: 73 yom admitted to ED B[facility] with manic symptoms. [facility] with D[facility]/PE, had been bridging LMWH and VKA prior to admission.  INR History: 7/9: INR - 1.99 7/10: no INR- warfarin 12.5 mg po daily at 0200 7/11: INR - 2.97- warfarin held 7/12: INR - 1.92 - warfarin 10 mg 7/13: INR - 1.95 - warfarin 10 mg 7/14: INR - 2.56 - warfarin 5 mg 7/15: INR - 2.73 - warfarin 5 mg 7/16: INR - 2.39 - warfarin 7.5 mg 7/17: INR - 2.22 - warfarin 7.5 mg 7/18: INR - 2.53 - warfarin 7.5 mg 7/19: INR - 2.75 - warfarin 7.5 mg 7/20: INR - 2.99 - warfarin 6.5 mg 7/21: INR - 2.64 - warfarin 6.5 mg  7/22: INR: 2.44; warfarin 8  7/23: INR: 2.60;  Warfarin 10mg  7/24: INR  2.44 - warfarin 10mg  7/25:  INR 3.22 - warfarin 10mg  7/26:  INR 3.08 - warfarin 8mg  7/27:  INR 3.24 - warfarin 8 mg 7/28:  INR 2.79 - 8 mg 7/29:  INR 2.74- 8 mg 7/30:  INR 3.19 - 7 mg 7/31:  INR 2.73  7.5mg  8/1:    INR 3.13  7.5mg  8/2:    INR 2.74  7.5mg  8/3     INR 2.27  Goal of Therapy:  INR 2-3 Monitor platelets by anticoagulation protocol: Yes   Plan:  INR is therapeutic today. Continue 7.5mg  daily and follow up with INR in the AM  (Patient with orders for carbamazepine which interacts with warfarin to decrease the INR.  Will follow INR closely.)   Pt will need CBC q3 days per policy.   Pharmacy will continue to follow.   Glo Herring Clinical Pharmacist  01/04/2016,8:14 AM

## 2016-01-04 NOTE — BHH Counselor (Signed)
CSW attempted to complete a PSA with the pt, but pt was too acute as evidenced by the pt ambulating nonstop in the hallways, pressured speech and only communicating in an indirect manner with the CSW by referring to the CSW indirectly when speaking to a MHT.                                                                    [name]. Tiwana Chavis, LCSWA, LCAS       01/04/16

## 2016-01-04 NOTE — Plan of Care (Signed)
Problem: Coping: Goal: Ability to verbalize frustrations and anger appropriately will improve Outcome: Not Progressing Patient unable to make decision

## 2016-01-04 NOTE — Progress Notes (Signed)
Report given to [place], RN in [place].

## 2016-01-04 NOTE — Consult Note (Signed)
  Psychiatry: Patient is being transferred back to the behavioral health unit from the medicine service this morning. He has completed IV therapy with antibiotics and is being switched over to oral Augmentin. Full note to follow with admission. Overall treatment plan continue medication for bipolar disorder as well as treating his infection, his diabetes. ECT scheduled for tomorrow. Orders and notes follow.

## 2016-01-04 NOTE — Progress Notes (Signed)
Patient ID: AUTHUR LAUGHEAD, male   DOB: [birth year], 50 y.o.   MRN: [id number] CSW called the pt's wife Vadhir Tehan at [phone] option [phone] ext [phone].  Pt's wife said that as of Saturday the pt presented as if he was "getting much better" and this observation was echoed by the pt's parents who visited the pt at the hospital.  The pt's wife then said that the pt has started to become more manic as of Monday and then had decompensated even more dramatically.  Pt's wife reported that "equipment" in his hospital room on the medical may have "triggered" the pt and that this has always been a "trigger" for the pt.  Pt's wife reported that on Saturday, there pt "felt hot" and the pt's wife reported the pt said he "did not feel good" and that this happened about the same time that his infection occurred and that the wife said she "felt the pt and that he felt hot".  Pt reported a desire to know "what is causing" the decompensation".    The called the pt's doctor Dr. Algie Coffer and informed the doctor that the pt's wife had some concerns about the effectiveness of the ECT treatment, despite her knowledge that ECT had always helped in the past.

## 2016-01-05 ENCOUNTER — Other Ambulatory Visit: Payer: Self-pay | Admitting: *Deleted

## 2016-01-05 ENCOUNTER — Encounter: Payer: Self-pay | Admitting: *Deleted

## 2016-01-05 ENCOUNTER — Inpatient Hospital Stay: Payer: 59 | Admitting: Anesthesiology

## 2016-01-05 ENCOUNTER — Inpatient Hospital Stay: Payer: 59

## 2016-01-05 LAB — GLUCOSE, CAPILLARY
GLUCOSE-CAPILLARY: 153 mg/dL — AB (ref 65–99)
GLUCOSE-CAPILLARY: 86 mg/dL (ref 65–99)
Glucose-Capillary: 128 mg/dL — ABNORMAL HIGH (ref 65–99)
Glucose-Capillary: 117 mg/dL — ABNORMAL HIGH (ref 65–99)

## 2016-01-05 LAB — PROTIME-INR
INR: 3.19
Prothrombin Time: 33.4 s — ABNORMAL HIGH (ref 11.4–15.2)

## 2016-01-05 LAB — CARBAMAZEPINE, FREE AND TOTAL
CARBAMAZEPINE FREE: 1.6 ug/mL (ref 0.6–4.2)
CARBAMAZEPINE, TOTAL: 4.7 ug/mL (ref 4.0–12.0)

## 2016-01-05 MED ORDER — HALOPERIDOL LACTATE 5 MG/ML IJ SOLN: 20.0000 mg | Freq: Four times a day (QID) | INTRAMUSCULAR | Status: DC | PRN

## 2016-01-05 MED ORDER — KETOROLAC TROMETHAMINE 30 MG/ML IJ SOLN
INTRAMUSCULAR | Status: AC
  Filled 2016-01-05: qty 1

## 2016-01-05 MED ORDER — KETOROLAC TROMETHAMINE 30 MG/ML IJ SOLN: 30.0000 mg | Freq: Once | INTRAMUSCULAR | Status: AC

## 2016-01-05 MED ORDER — WARFARIN SODIUM 4 MG PO TABS
8.0000 mg | ORAL_TABLET | Freq: Every day | ORAL | Status: DC
  Administered 2016-01-05: 8 mg via ORAL
  Filled 2016-01-05: qty 2

## 2016-01-05 MED ORDER — SUCCINYLCHOLINE CHLORIDE 200 MG/10ML IV SOSY
PREFILLED_SYRINGE | INTRAVENOUS | Status: DC | PRN
  Administered 2016-01-05: 150 mg via INTRAVENOUS

## 2016-01-05 MED ORDER — KETOROLAC TROMETHAMINE 30 MG/ML IJ SOLN
30.0000 mg | Freq: Once | INTRAMUSCULAR | Status: AC
  Administered 2016-01-05: 30 mg via INTRAVENOUS

## 2016-01-05 MED ORDER — LORAZEPAM 2 MG/ML IJ SOLN: 2.0000 mg | Freq: Four times a day (QID) | INTRAMUSCULAR | Status: DC | PRN

## 2016-01-05 MED ORDER — WARFARIN SODIUM 4 MG PO TABS: 8.0000 mg | ORAL_TABLET | Freq: Every day | ORAL | Status: DC

## 2016-01-05 MED ORDER — METHOHEXITAL SODIUM 100 MG/10ML IV SOSY
PREFILLED_SYRINGE | INTRAVENOUS | Status: DC | PRN
  Administered 2016-01-05: 100 mg via INTRAVENOUS

## 2016-01-05 MED ORDER — MIDAZOLAM HCL 2 MG/2ML IJ SOLN
INTRAMUSCULAR | Status: DC | PRN
Start: 2016-01-05 — End: 2016-01-05
  Administered 2016-01-05: 2 mg via INTRAVENOUS

## 2016-01-05 MED ORDER — WARFARIN - PHARMACIST DOSING INPATIENT
Freq: Every day | Status: DC
  Administered 2016-01-05 – 2016-01-12 (×5)
  Administered 2016-01-13: 6
  Administered 2016-01-14 – 2016-01-15 (×2)
  Administered 2016-01-17: 7.5
  Administered 2016-01-19 – 2016-02-01 (×9)

## 2016-01-05 MED ORDER — SODIUM CHLORIDE 0.9 % IV SOLN
250.0000 mL | Freq: Once | INTRAVENOUS | Status: AC
  Administered 2016-01-05: 10:00:00 via INTRAVENOUS

## 2016-01-05 MED ORDER — SODIUM CHLORIDE 0.9 % IV SOLN
INTRAVENOUS | Status: DC | PRN
  Administered 2016-01-05: 11:00:00 via INTRAVENOUS

## 2016-01-05 NOTE — Progress Notes (Addendum)
Nursing 1:1 note D:Pt observed sitting in dayroom RR even and unlabored. No distress noted. A: 1:1 observation continues for safety  R: pt remains safe  

## 2016-01-05 NOTE — Procedures (Signed)
ECT SERVICES Physician's Interval Evaluation & Treatment Note  Patient Identification: Cameron [name] MRN:  [id number] Date of Evaluation:  01/05/2016 TX #: 6  MADRS:   MMSE:   P.E. Findings:  Leg is still read. Vitals stable.  Psychiatric Interval Note:  He is slower today thanks to the medicine last night but still tangential and disorganized and euphoric  Subjective:  Patient is a 50 y.o. male seen for evaluation for Electroconvulsive Therapy. No specific complaint on his part  Treatment Summary:bgy  []   Right Unilateral             [x]  Bilateral   % Energy : 1.0 ms 100%   Impedance: 870 ohms  Seizure Energy Index: 502 V squared  Postictal Suppression Index: Less than 10%  Seizure Concordance Index: 93%  Medications  Pre Shock: Toradol 30 mg, Brevital 100 mg, succinylcholine 150 mg  Post Shock: Versed 2 mg  Seizure Duration: 5 seconds by EMG, 108 seconds by EEG   Comments: Definite seizure but the ending was hard to distinguish. Poor postictal suppression for certain. Patient has had an increase in his medicine which is probably in part responsible for that. Continue on Monday.   Lungs:  [x]   Clear to auscultation               []  Other:   Heart:    [x]   Regular rhythm             []  irregular rhythm    [x]   Previous H&P reviewed, patient examined and there are NO CHANGES                 []   Previous H&P reviewed, patient examined and there are changes noted.   Alethia Berthold, MD 8/4/201711:28 AM

## 2016-01-05 NOTE — Progress Notes (Signed)
Off unit.

## 2016-01-05 NOTE — Progress Notes (Signed)
[facility] MD Progress Note  01/05/2016 3:46 PM Cameron [name]  MRN:  [id number] Subjective:  Patient seen in treatment team and also prior and after ECT today. Patient reports today that he feels he's doing a little better although his history is still so incoherent that it's hard to make much of it. Last night he was apparently so wild and agitated that he was trying to escape from the unit and required quite a bit of intramuscular medicine. It eventually put him to sleep and today he was calm before ECT but is still disorganized euphoric hyperverbal and makes very little sense in conversation. I have any new physical complaints Principal Problem: Bipolar I disorder, current or most recent episode manic, with psychotic features ([place]on [name]) Diagnosis:   Patient Active Problem List   Diagnosis Date Noted  . Bipolar affective disorder, current episode manic ([name]) [F31.9] 01/04/2016  . Cellulitis of leg, right [L03.115] 01/01/2016  . Bipolar I disorder, current or most recent episode manic, with psychotic features ([place]on [name]) [F31.2] 12/11/2015  . Pulmonary embolism ([place]) [I26.99] 12/04/2015  . Diabetes mellitus without complication ([name]) [id number]   . Hypercholesteremia [E78.00]   . CKD (chronic kidney disease), stage III [N18.3]   . Acute deep vein thrombosis (DVT) of femoral vein of right lower extremity (HCC) [I82.411]    Total Time spent with patient: 30 minutes  Past Psychiatric History: Long history of bipolar disorder multiple manic episodes. History of response to ECT.  Past Medical History:  Past Medical History:  Diagnosis Date  . Bipolar 1 disorder ([place]on [name])   . CKD (chronic kidney disease), stage III   . Diabetes mellitus without complication ([place]ameron [name])   . DVT (deep venous thrombosis) ([place]on [name])   . Hypercholesteremia   . Hypertension     Past Surgical History:  Procedure Laterality Date  . APPENDECTOMY     Family History:  Family History  Problem Relation Age of Onset  . Stroke Other   . Diabetes  Other    Family Psychiatric  History: Positive for bipolar disorder Social History:  History  Alcohol Use  . Yes    Comment: rarely     History  Drug Use No    Social History   Social History  . Marital status: Married    Spouse name: N/A  . Number of children: N/A  . Years of education: N/A   Social History Main Topics  . Smoking status: Never Smoker  . Smokeless tobacco: Never Used  . Alcohol use Yes     Comment: rarely  . Drug use: No  . Sexual activity: Not Asked   Other Topics Concern  . None   Social History Narrative  . None   Additional Social History:                         Sleep: Fair  Appetite:  Fair  Current Medications: Current Facility-Administered Medications  Medication Dose Route Frequency Provider Last Rate Last Dose  . acetaminophen (TYLENOL) tablet 650 mg  650 mg Oral Q6H PRN Gonzella Lex, MD      . alum & mag hydroxide-simeth (MAALOX/MYLANTA) 200-200-20 MG/5ML suspension 30 mL  30 mL Oral Q4H PRN Gonzella Lex, MD      . amoxicillin-clavulanate (AUGMENTIN) 875-125 MG per tablet 1 tablet  1 tablet Oral Q12H Gonzella Lex, MD   1 tablet at 01/05/16 1308  . atorvastatin (LIPITOR) tablet 40 mg  40 mg Oral q1800 Damita Eppard T  Aryel Edelen, MD   40 mg at 01/04/16 1808  . carbamazepine (TEGRETOL) tablet 1,200 mg  1,200 mg Oral BID PC Gonzella Lex, MD   1,200 mg at 01/05/16 1307  . dextrose 5 % solution 250 mL  250 mL Intravenous Once Gonzella Lex, MD      . enalapril (VASOTEC) tablet 5 mg  5 mg Oral BID Gonzella Lex, MD   5 mg at 01/05/16 [id number]  . fenofibrate tablet 160 mg  160 mg Oral Daily Gonzella Lex, MD   160 mg at 01/05/16 1307  . haloperidol lactate (HALDOL) injection 20 mg  20 mg Intramuscular Q6H PRN Gonzella Lex, MD      . insulin glargine (LANTUS) injection 60 Units  60 Units Subcutaneous QHS Gonzella Lex, MD   60 Units at 01/04/16 2200  . ketorolac (TORADOL) 30 MG/ML injection 30 mg  30 mg Intravenous Once Gonzella Lex,  MD      . lamoTRIgine (LAMICTAL) tablet 200 mg  200 mg Oral Daily Gonzella Lex, MD   200 mg at 01/05/16 1307  . LORazepam (ATIVAN) injection 2 mg  2 mg Intramuscular Q6H PRN Gonzella Lex, MD      . magnesium hydroxide (MILK OF MAGNESIA) suspension 30 mL  30 mL Oral Daily PRN Gonzella Lex, MD      . QUEtiapine (SEROQUEL) tablet 1,200 mg  1,200 mg Oral QHS Gonzella Lex, MD   1,200 mg at 01/04/16 2200  . temazepam (RESTORIL) capsule 15 mg  15 mg Oral QHS Gonzella Lex, MD   15 mg at 01/04/16 2200  . warfarin (COUMADIN) tablet 8 mg  8 mg Oral q1800 Gonzella Lex, MD      . Warfarin - Pharmacist Dosing Inpatient   Does not apply q1800 Gonzella Lex, MD        Lab Results:  Results for orders placed or performed during the hospital encounter of 01/04/16 (from the past 48 hour(s))  Carbamazepine level, total     Status: Abnormal   Collection Time: 01/04/16  3:58 PM  Result Value Ref Range   Carbamazepine Lvl 2.9 (L) 4.0 - 12.0 ug/mL  Glucose, capillary     Status: None   Collection Time: 01/04/16  4:39 PM  Result Value Ref Range   Glucose-Capillary 94 65 - 99 mg/dL  Glucose, capillary     Status: Abnormal   Collection Time: 01/04/16  8:23 PM  Result Value Ref Range   Glucose-Capillary 144 (H) 65 - 99 mg/dL  Glucose, capillary     Status: Abnormal   Collection Time: 01/05/16  6:42 AM  Result Value Ref Range   Glucose-Capillary 153 (H) 65 - 99 mg/dL  Glucose, capillary     Status: None   Collection Time: 01/05/16 12:30 PM  Result Value Ref Range   Glucose-Capillary 86 65 - 99 mg/dL  Protime-INR     Status: Abnormal   Collection Time: 01/05/16  1:22 PM  Result Value Ref Range   Prothrombin Time 33.4 (H) 11.4 - 15.2 seconds   INR 3.19     Blood Alcohol level:  Lab Results  Component Value Date   ETH <5 [id number]    Metabolic Disorder Labs: Lab Results  Component Value Date   HGBA1C 7.4 (H) 01/02/2016   MPG 192 12/04/2015   MPG 318 (H) 08/24/2010   Lab Results    Component Value Date   PROLACTIN 4.4 12/11/2015  Lab Results  Component Value Date   CHOL 143 12/11/2015   TRIG 286 (H) 12/11/2015   HDL 32 (L) 12/11/2015   CHOLHDL 4.5 12/11/2015   VLDL 57 (H) 12/11/2015   LDLCALC 54 12/11/2015   LDLCALC 56 12/05/2015    Physical Findings: AIMS:  , ,  ,  ,    CIWA:    COWS:     Musculoskeletal: Strength & Muscle Tone: within normal limits Gait & Station: normal Patient leans: N/A  Psychiatric Specialty Exam: Physical Exam  Nursing note and vitals reviewed. Constitutional: He appears well-developed and well-nourished.  HENT:  Head: Normocephalic and atraumatic.  Eyes: Conjunctivae are normal. Pupils are equal, round, and reactive to light.  Neck: Normal range of motion.  Cardiovascular: Normal heart sounds.   Respiratory: Effort normal. No respiratory distress.  GI: Soft.  Musculoskeletal: Normal range of motion.  Neurological: He is alert.  Skin: Skin is warm and dry.     Psychiatric: His affect is labile and inappropriate. His speech is tangential. He is hyperactive. Thought content is delusional. He expresses impulsivity. He expresses no homicidal and no suicidal ideation. He exhibits abnormal recent memory. He is inattentive.    Review of Systems  Constitutional: Negative.   HENT: Negative.   Eyes: Negative.   Respiratory: Negative.   Cardiovascular: Negative.   Gastrointestinal: Negative.   Musculoskeletal: Negative.   Skin: Negative.   Neurological: Negative.   Psychiatric/Behavioral: Negative for depression, hallucinations, memory loss, substance abuse and suicidal ideas. The patient is not nervous/anxious and does not have insomnia.     Blood pressure 113/74, pulse 82, temperature 97.5 F (36.4 C), resp. rate 16, height 6' (1.829 m), weight 127 kg (280 lb), SpO2 98 %.Body mass index is 37.97 kg/m.  General Appearance: Disheveled  Eye Contact:  Fair  Speech:  Pressured  Volume:  Increased  Mood:  Euphoric   Affect:  Labile  Thought Process:  Disorganized  Orientation:  Full (Time, Place, and Person)  Thought Content:  Illogical, Delusions, Rumination and Tangential  Suicidal Thoughts:  No  Homicidal Thoughts:  No  Memory:  Immediate;   Fair Recent;   Poor Remote;   Fair  Judgement:  Impaired  Insight:  Shallow  Psychomotor Activity:  Increased  Concentration:  Concentration: Poor  Recall:  [place] of Knowledge:  Good  Language:  Good  Akathisia:  No  Handed:  Right  AIMS (if indicated):     Assets:  Financial Resources/Insurance Housing Social Support  ADL's:  Impaired  Cognition:  Impaired,  Mild  Sleep:  Number of Hours: 6     Treatment Plan Summary: Daily contact with patient to assess and evaluate symptoms and progress in treatment, Medication management and Plan Marshawn continues to be manic and extremely disorganized in his thinking although at least he is calm her today. He does not appear to be hostile to anyone individually although he still capable of getting quite agitated physically at times. Patient is still cooperating with ECT and had a treatment today which once again was tolerated well. After noting in his labs that his Tegretol level was even lower than previously yesterday I have greatly increased his Tegretol dose. I also increased his Seroquel dose. I have also added orders for when necessary intramuscular Haldol and Ativan at higher doses that seem to be effective for him. Patient is still far too disorganized labile and unpredictable to be outside of the hospital safely. Plan is to continue current medicines  and monitoring of medications as well as daily monitoring of his symptoms and ECT. Legs are looking better. Cooperative with antibiotics.  Alethia Berthold, MD 01/05/2016, 3:46 PM

## 2016-01-05 NOTE — Progress Notes (Signed)
In community room eating lunch with sitter at side.

## 2016-01-05 NOTE — Anesthesia Procedure Notes (Signed)
Date/Time: 01/05/2016 11:33 AM Performed by: Dionne Bucy Pre-anesthesia Checklist: Patient identified, Emergency Drugs available, Suction available and Patient being monitored Patient Re-evaluated:Patient Re-evaluated prior to inductionOxygen Delivery Method: Circle system utilized Preoxygenation: Pre-oxygenation with 100% oxygen Intubation Type: IV induction Ventilation: Mask ventilation without difficulty and Mask ventilation throughout procedure Airway Equipment and Method: Bite block Placement Confirmation: positive ETCO2 Dental Injury: Teeth and Oropharynx as per pre-operative assessment

## 2016-01-05 NOTE — Progress Notes (Signed)
Pacing in room talking to sitter.

## 2016-01-05 NOTE — Progress Notes (Addendum)
Nursing 1:1 note D:Pt observed sleeping in chair with eyes closed. RR even and unlabored. No distress noted. A: 1:1 observation continues for safety  R: pt remains safe

## 2016-01-05 NOTE — Progress Notes (Signed)
Sitting in dayroom eating dinner and talking with peers.

## 2016-01-05 NOTE — Progress Notes (Signed)
Recreation Therapy Notes  Date: 08.04.17 Time: 1:00 pm Location: Craft Room  Group Topic: Coping Skills  Goal Area(s) Addresses:  Patient will participate in healthy coping skill. Patient will verbalize benefit of using art as a coping skill.  Behavioral Response: Did not attend  Intervention: Coloring  Activity: Patients were given coloring sheets to color and were instructed to think about the emotions they were feeling and what their mind was focused on.  Education: LRT educated patients on healthy coping skills.  Education Outcome: Patient did not attend group.  Clinical Observations/Feedback: Patient did not attend group.  [name], LRT/CTRS 01/05/2016 2:04 PM

## 2016-01-05 NOTE — Progress Notes (Signed)
Nursing 1:1 note D:Pt observed sitting in chair awake. RR even and unlabored. No distress noted. A: 1:1 observation continues for safety  R: pt remains safe

## 2016-01-05 NOTE — H&P (Signed)
C[place]on [name] is an 50 y.o. male.   Chief Complaint: Patient has no specific complaint HPI: Patient with bipolar disorder manic. Today he looks sedated still from the medicine he got last night but he is still hyperverbal disorganized tangential in his thinking.  Past Medical History:  Diagnosis Date  . Bipolar 1 disorder ([place])   . CKD (chronic kidney disease), stage III   . Diabetes mellitus without complication ([place])   . DVT (deep venous thrombosis) ([place])   . Hypercholesteremia   . Hypertension     Past Surgical History:  Procedure Laterality Date  . APPENDECTOMY      Family History  Problem Relation Age of Onset  . Stroke Other   . Diabetes Other    Social History:  reports that he has never smoked. He has never used smokeless tobacco. He reports that he drinks alcohol. He reports that he does not use drugs.  Allergies:  Allergies  Allergen Reactions  . Asa [Aspirin] Other (See Comments)    Patient tolerates LOW DOSE ASPIRIN.    Medications Prior to Admission  Medication Sig Dispense Refill  . enalapril (VASOTEC) 5 MG tablet Take 5 mg by mouth 2 (two) times daily.    Marland Kitchen amoxicillin-clavulanate (AUGMENTIN) 875-125 MG tablet Take 1 tablet by mouth 2 (two) times daily. X 7 more days 14 tablet 0  . ARIPiprazole (ABILIFY) 30 MG tablet Take 30 mg by mouth every evening.     Marland Kitchen atorvastatin (LIPITOR) 40 MG tablet Take 40 mg by mouth daily.  11  . carbamazepine (TEGRETOL) 200 MG tablet Take 2 tablets (400 mg total) by mouth every morning. 30 tablet 0  . carbamazepine (TEGRETOL) 200 MG tablet Take 3 tablets (600 mg total) by mouth every evening. 30 tablet 0  . fenofibrate 160 MG tablet Take 160 mg by mouth daily.    . insulin aspart (NOVOLOG) 100 UNIT/ML injection Inject 0-9 Units into the skin 3 (three) times daily with meals. 10 mL 11  . insulin aspart (NOVOLOG) 100 UNIT/ML injection Inject 0-5 Units into the skin at bedtime. 10 mL 11  . Insulin Glargine (LANTUS SOLOSTAR) 100  UNIT/ML Solostar Pen Inject 60 Units into the skin daily.     Marland Kitchen lamoTRIgine (LAMICTAL) 100 MG tablet Take 400 mg by mouth at bedtime.    . Multiple Vitamin (MULTIVITAMIN WITH MINERALS) TABS tablet Take 1 tablet by mouth daily.    . QUEtiapine (SEROQUEL) 400 MG tablet Take 3 tablets (1,200 mg total) by mouth at bedtime. 90 tablet 0  . TANZEUM 50 MG PEN Inject 50 mg into the skin every 7 (seven) days.  2  . temazepam (RESTORIL) 15 MG capsule Take 15 mg by mouth at bedtime as needed for sleep.    Marland Kitchen warfarin (COUMADIN) 7.5 MG tablet Take 1 tablet (7.5 mg total) by mouth daily at 6 PM. 30 tablet 0    Results for orders placed or performed during the hospital encounter of 01/04/16 (from the past 48 hour(s))  Carbamazepine level, total     Status: Abnormal   Collection Time: 01/04/16  3:58 PM  Result Value Ref Range   Carbamazepine Lvl 2.9 (L) 4.0 - 12.0 ug/mL  Glucose, capillary     Status: None   Collection Time: 01/04/16  4:39 PM  Result Value Ref Range   Glucose-Capillary 94 65 - 99 mg/dL  Glucose, capillary     Status: Abnormal   Collection Time: 01/04/16  8:23 PM  Result Value Ref  Range   Glucose-Capillary 144 (H) 65 - 99 mg/dL  Glucose, capillary     Status: Abnormal   Collection Time: 01/05/16  6:42 AM  Result Value Ref Range   Glucose-Capillary 153 (H) 65 - 99 mg/dL   No results found.  Review of Systems  Constitutional: Negative.   HENT: Negative.   Eyes: Negative.   Respiratory: Negative.   Cardiovascular: Negative.   Gastrointestinal: Negative.   Musculoskeletal: Negative.   Skin: Negative.   Neurological: Negative.   Psychiatric/Behavioral: Positive for memory loss. Negative for depression, hallucinations, substance abuse and suicidal ideas. The patient has insomnia. The patient is not nervous/anxious.     Blood pressure 115/80, pulse 86, temperature 98.2 F (36.8 C), temperature source Tympanic, resp. rate 20, height 6' (1.829 m), weight 127 kg (280 lb), SpO2 99  %. Physical Exam  Vitals reviewed. Constitutional: He appears well-developed and well-nourished.  HENT:  Head: Normocephalic and atraumatic.  Eyes: Conjunctivae are normal. Pupils are equal, round, and reactive to light.  Neck: Normal range of motion.  Cardiovascular: Regular rhythm and normal heart sounds.   Respiratory: Effort normal. No respiratory distress.  GI: Soft.  Musculoskeletal: Normal range of motion.  Neurological: He is alert.  Skin: Skin is warm and dry.  Psychiatric: His affect is labile and inappropriate. His speech is rapid and/or pressured and tangential. He is slowed. He expresses impulsivity. He expresses no suicidal ideation. He exhibits abnormal recent memory.     Assessment/Plan Continue ECT. We are also trying to increase medicines to get better control.  Alethia Berthold, MD 01/05/2016, 11:26 AM

## 2016-01-05 NOTE — Progress Notes (Addendum)
Nursing 1:1 note D:Pt observed sleeping in chair  with eyes closed. RR even and unlabored. No distress noted. A: 1:1 observation continues for safety  R: pt remains safe

## 2016-01-05 NOTE — Transfer of Care (Signed)
Immediate Anesthesia Transfer of Care Note  Patient: Cameron [name]  Procedure(s) Performed: ECT  Patient Location: PACU  Anesthesia Type:General  Level of Consciousness: sedated  Airway & Oxygen Therapy: Patient Spontanous Breathing and Patient connected to face mask oxygen  Post-op Assessment: Report given to RN and Post -op Vital signs reviewed and stable  Post vital signs: Reviewed and stable  Last Vitals:  Vitals:   01/05/16 0859 01/05/16 1145  BP: 115/80 138/75  Pulse: 86 (!) 102  Resp:  (!) 22  Temp: 36.8 C 36.4 C    Last Pain:  Vitals:   01/05/16 1145  TempSrc:   PainSc: Asleep         Complications: No apparent anesthesia complications

## 2016-01-05 NOTE — Progress Notes (Signed)
In room.  Sitter at bedside.

## 2016-01-05 NOTE — Anesthesia Preprocedure Evaluation (Signed)
Anesthesia Evaluation  Patient identified by MRN, date of birth, ID band Patient awake    Reviewed: Allergy & Precautions, NPO status , Patient's Chart, lab work & pertinent test results  History of Anesthesia Complications Negative for: history of anesthetic complications  Airway Mallampati: II  TM Distance: >3 FB Neck ROM: Full    Dental no notable dental hx.    Pulmonary neg COPD, PE (hx of PE, on anticoagulation)   breath sounds clear to auscultation- rhonchi (-) wheezing      Cardiovascular hypertension, Pt. on medications (-) angina+ Peripheral Vascular Disease  (-) CAD and (-) Past MI  Rhythm:Regular Rate:Normal - Systolic murmurs and - Diastolic murmurs    Neuro/Psych PSYCHIATRIC DISORDERS Bipolar Disorder negative neurological ROS     GI/Hepatic negative GI ROS, Neg liver ROS,   Endo/Other  diabetes, Well Controlled, Insulin Dependent  Renal/GU CRFRenal disease (baseline Cr 1.6)     Musculoskeletal negative musculoskeletal ROS (+)   Abdominal (+) + obese,   Peds  Hematology negative hematology ROS (+)   Anesthesia Other Findings Past Medical History: No date: Bipolar 1 disorder ([facility]) No date: CKD (chronic kidney disease), stage III No date: Diabetes mellitus without complication ([facility]) No date: DVT (deep venous thrombosis) ([facility]) No date: Hypercholesteremia No date: Hypertension   Reproductive/Obstetrics                             Anesthesia Physical  Anesthesia Plan  ASA: III  Anesthesia Plan: General   Post-op Pain Management:    Induction: Intravenous  Airway Management Planned: Mask  Additional Equipment:   Intra-op Plan: Delibrate Circulatory arrest per surgeon request  Post-operative Plan:   Informed Consent: I have reviewed the patients History and Physical, chart, labs and discussed the procedure including the risks, benefits and alternatives for the  proposed anesthesia with the patient or authorized representative who has indicated his/her understanding and acceptance.     Plan Discussed with: Anesthesiologist and CRNA  Anesthesia Plan Comments:         Anesthesia Quick Evaluation

## 2016-01-05 NOTE — BHH Group Notes (Signed)
[facility] Group Notes:  (Nursing/MHT/Case Management/Adjunct)  Date:  01/05/2016  Time:  3:58 PM  Type of Therapy:  Psychoeducational Skills  Participation Level:  Did Not Attend  Charise Killian 01/05/2016, 3:58 PM

## 2016-01-05 NOTE — Progress Notes (Signed)
Standing outside of medication room with sitter by his side.

## 2016-01-05 NOTE — Plan of Care (Signed)
Problem: Coping: Goal: Ability to cope will improve Outcome: Not Progressing Pt had to be placed on 1:1 security sitter due to pt trying to leave the unit, verbally aggressive to staff and not redirectable.

## 2016-01-05 NOTE — Anesthesia Postprocedure Evaluation (Signed)
Anesthesia Post Note  Patient: Cameron [name]  Procedure(s) Performed: * No procedures listed *  Patient location during evaluation: PACU Anesthesia Type: General Level of consciousness: awake and alert Pain management: pain level controlled Vital Signs Assessment: post-procedure vital signs reviewed and stable Respiratory status: spontaneous breathing, nonlabored ventilation, respiratory function stable and patient connected to nasal cannula oxygen Cardiovascular status: blood pressure returned to baseline and stable Postop Assessment: no signs of nausea or vomiting Anesthetic complications: no    Last Vitals:  Vitals:   01/05/16 1205 01/05/16 1215  BP: 123/73 113/74  Pulse: 79 82  Resp: 18 16  Temp:      Last Pain:  Vitals:   01/05/16 1205  TempSrc:   PainSc: Asleep                 Martha Clan

## 2016-01-05 NOTE — Tx Team (Signed)
Interdisciplinary Treatment Plan Update (Adult)        Date: 01/05/2016   Time Reviewed: 9:30 AM   Progress in Treatment: Improving  Attending groups: No Participating in groups: No Taking medication as prescribed: Yes  Tolerating medication: Yes  Family/Significant other contact made:Yes, CSW spoke to the pt's wife [name] at ph: [phone] option 4, Ext [phone] Patient understands diagnosis: Yes  Discussing patient identified problems/goals with staff: Yes  Medical problems stabilized or resolved: Yes  Denies suicidal/homicidal ideation: Yes  Issues/concerns per patient self-inventory: Yes  Other:   New problem(s) identified: N/A   Discharge Plan or Barriers:  Pt will later discharge home to [facility] to live with his family and will follow up with [name] for medication management and with [facility] for therapy   Reason for Continuation of Hospitalization:   Depression   Anxiety   Medication Stabilization   Comments: N/A   Estimated length of stay: 3-5 days     Patient is a 50 year old male with a history of bipolar disorder.  Pt's chief complaint. "I committed myself."   History of present illness. Information was obtained from the patient and the chart. The patient has a long history of bipolar Disorder with multiple psychiatric hospitalizations he has been stable since 2009 on a combination of Abilify, Seroquel, Tegretol, Lamictal, and Restoril in the care of dr. Thurmond [name], his primary psychiatrist. The patient has been compliant with medications and doctors appointments. On July 2 he was admitted to medical floor for DVT and pulmonary embolism. At that time some of his medications were discontinued or the doses were lowered. The patient started developing manic symptoms. He became insomniac, hyperactive, hyperverbal and possibly psychotic. He called exterminators to his house twice to deal with critters that he could see on the walls. The wife  suggested that he comes to the hospital and the patient agreed. He is very proud of himself that he committed himself. He is very proud of the fact the years he developed good coping skills. This includes good treatment compliance as well as ability to listen and not to interrupt. He denies symptoms of anxiety. There are no alcohol or drugs involved.   Past psychiatric history. Long history of bipolar mania. He used to be hospitalized every 2 years for severe manic episode that it will require extended hospitalizations and ECT treatments. His last episode was in 2009. She received ECT treatment according to the patient flipped him to depression.    Family psychiatric history. Daughter with bipolar and ADHD.   Social history. He experienced several stressors in the pas ttwo years, sickness of his mother and job loss, that did not result in exacerbation of his illness. Recent hospital admission were some omissions in his medication regimen were made brought him to the hospital again. 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 . Patient lives in [name]. Patient will benefit from crisis stabilization, medication evaluation, group therapy, and psycho education in addition to case management for discharge planning. Patient and CSW reviewed pt's identified goals and treatment plan. Pt verbalized understanding and agreed to treatment plan.    Review of initial/current patient goals per problem list:  1. Goal(s): Patient will participate in aftercare plan   Met: Yes Target date: 3-5 days post admission date   As evidenced by: Patient will participate within aftercare plan AEB aftercare provider and housing plan at discharge being identified.   8/4: Pt will later discharge home  to [facility] to live with his family and will follow up with [name] for medication management and with [place] [name] for therapy   2. . Goal(s): Patient will demonstrate decreased signs  of psychosis  * Met: No * Target date: 3-5 days post admission date  * As evidenced by: Patient will demonstrate decreased frequency of AVH or return to baseline function   8/4: Goal progressing.  Pt denies AVH   3. Goal (s): Patient will demonstrate decreased signs of mania  * Met: No * Target date: 3-5 days post admission date  * As evidenced by: Patient demonstrate decreased signs of mania AEB decreased mood instability and demonstration of stable mood   8/4: Goal progressing.   Attendees:  Patient: [name] Family:  Physician: Dr. Weber Cooks, MD     01/05/2016 9:30 AM  Nursing: Cameron Radon, RN     01/05/2016 9:30 AM  Clinical Social Worker: [name], [place]  01/05/2016 9:30 AM  Other:       01/05/2016 9:30 AM  Other: , NP       01/05/2016 9:30 AM  Other:        01/05/2016 9:30 AM  Other:        01/05/2016 9:30 AM   [name]. Isabella Roemmich, LCSWA, LCAS  01/05/16

## 2016-01-05 NOTE — BHH Counselor (Signed)
CSW attempted PSA, but pt was too acute as evidenced by the pt exhibiting pressured speech, presenting as manic and disorganized thinking.    [name]. Keajah Killough, LCSWA, LCAS   01/05/16

## 2016-01-05 NOTE — Progress Notes (Signed)
Up in recliner sleeping.  Sitter at bedside.

## 2016-01-05 NOTE — Progress Notes (Signed)
Patient ID: Cameron [name], male   DOB: [birth year], 50 y.o.   MRN: [id number] CSW attempted PSA, but pt was too acute as evidenced by the pt exhibiting pressured speech, presenting as manic and with disorganized thinking.    [name]. Delos Klich, LCSWA, LCAS   01/05/16

## 2016-01-05 NOTE — Progress Notes (Signed)
ANTICOAGULATION CONSULT NOTE - Follow Up Consult  Pharmacy Consult for Warfarin Indication: VTE treatment  Allergies  Allergen Reactions  . Asa [Aspirin] Other (See Comments)    Patient tolerates LOW DOSE ASPIRIN.    Patient Measurements: Height: 6' (182.9 cm) Weight: 289 lb (131.1 kg) IBW/kg (Calculated) : 77.6  Vital Signs: BP: 115/59 (08/04 0703) Pulse Rate: 81 (08/04 0703)  Labs:  Recent Labs  01/03/16 0334 01/04/16 0451  LABPROT 29.6* 25.4*  INR 2.74 2.27  CREATININE 1.76* 1.60*   Estimated Creatinine Clearance: 76.5 mL/min (by C-G formula based on SCr of 1.6 mg/dL).  Medical History: Past Medical History:  Diagnosis Date  . Bipolar 1 disorder ([place])   . CKD (chronic kidney disease), stage III   . Diabetes mellitus without complication ([place])   . DVT (deep venous thrombosis) ([place])   . Hypercholesteremia   . Hypertension    Medications:  Warfarin 10 mg po daily  Assessment: 21 yom admitted to ED [facility] with manic symptoms. Recently discharged from [facility] with DVT/PE, had been bridging LMWH and VKA prior to admission.  INR History: 7/9: INR - 1.99 7/10: no INR- warfarin 12.5 mg po daily at 0200 7/11: INR - 2.97- warfarin held 7/12: INR - 1.92 - warfarin 10 mg 7/13: INR - 1.95 - warfarin 10 mg 7/14: INR - 2.56 - warfarin 5 mg 7/15: INR - 2.73 - warfarin 5 mg 7/16: INR - 2.39 - warfarin 7.5 mg 7/17: INR - 2.22 - warfarin 7.5 mg 7/18: INR - 2.53 - warfarin 7.5 mg 7/19: INR - 2.75 - warfarin 7.5 mg 7/20: INR - 2.99 - warfarin 6.5 mg 7/21: INR - 2.64 - warfarin 6.5 mg  7/22: INR: 2.44; warfarin 8  7/23: INR: 2.60;  Warfarin 10mg  7/24: INR 2.44 - warfarin 10mg  7/25:  INR 3.22 - warfarin 10mg  7/26:  INR 3.08 - warfarin 8mg  7/27:  INR 3.24 - warfarin 8 mg 7/28:  INR 2.79 - 8 mg 7/29:  INR 2.74- 8 mg 7/30:  INR 3.19 - 7 mg 7/31:  INR 2.73  7.5mg  8/1:    INR 3.13  7.5mg  8/2:    INR 2.74  7.5mg  8/3     INR 2.27  7.5mg  8/4     INR pending results   Goal of  Therapy:  INR 2-3 Monitor platelets by anticoagulation protocol: Yes   Plan:  INR is therapeutic today. Increase to 8 mg daily and follow up with INR in the AM  (Patient with orders for carbamazepine which interacts with warfarin to decrease the INR.  Will follow INR closely.)   Ordered CBC for tomorrow per policy.   Pharmacy will continue to follow.   [place] Pharmacist  01/05/2016,8:23 AM

## 2016-01-05 NOTE — Progress Notes (Signed)
Nursing 1:1 note D:Pt observed standing in hallway, appropriate behavior observed. RR even and unlabored. No distress noted. A: 1:1 observation continues for safety  R: pt remains safe

## 2016-01-05 NOTE — Tx Team (Signed)
Initial Interdisciplinary Treatment Plan   PATIENT STRESSORS: Health problems   PATIENT STRENGTHS: Financial means Supportive family/friends   PROBLEM LIST: Problem List/Patient Goals Date to be addressed Date deferred Reason deferred Estimated date of resolution  mania                                                       DISCHARGE CRITERIA:  Improved stabilization in mood, thinking, and/or behavior  PRELIMINARY DISCHARGE PLAN: Return to previous living arrangement  PATIENT/FAMIILY INVOLVEMENT: This treatment plan has been presented to and reviewed with the patient, [name], and/or family member, .  The patient and family have been given the opportunity to ask questions and make suggestions.  [name] 01/05/2016, 9:12 AM

## 2016-01-06 LAB — CBC
HCT: 33.2 % — ABNORMAL LOW (ref 40.0–52.0)
Hemoglobin: 11.3 g/dL — ABNORMAL LOW (ref 13.0–18.0)
MCH: 28.6 pg (ref 26.0–34.0)
MCHC: 33.9 g/dL (ref 32.0–36.0)
MCV: 84.3 fL (ref 80.0–100.0)
PLATELETS: 286 10*3/uL (ref 150–440)
RBC: 3.95 MIL/uL — AB (ref 4.40–5.90)
RDW: 12.8 % (ref 11.5–14.5)
WBC: 3.3 10*3/uL — AB (ref 3.8–10.6)

## 2016-01-06 LAB — CULTURE, BLOOD (ROUTINE X 2)
CULTURE: NO GROWTH
Culture: NO GROWTH

## 2016-01-06 LAB — GLUCOSE, CAPILLARY
GLUCOSE-CAPILLARY: 73 mg/dL (ref 65–99)
GLUCOSE-CAPILLARY: 64 mg/dL — AB (ref 65–99)
Glucose-Capillary: 82 mg/dL (ref 65–99)
Glucose-Capillary: 137 mg/dL — ABNORMAL HIGH (ref 65–99)

## 2016-01-06 LAB — PROTIME-INR
INR: 3.04
PROTHROMBIN TIME: 32.1 s — AB (ref 11.4–15.2)

## 2016-01-06 MED ORDER — WARFARIN SODIUM 2.5 MG PO TABS
7.5000 mg | ORAL_TABLET | Freq: Every day | ORAL | Status: DC
  Administered 2016-01-06 – 2016-01-07 (×2): 7.5 mg via ORAL
  Filled 2016-01-06 (×2): qty 3

## 2016-01-06 NOTE — Progress Notes (Signed)
On telephone.

## 2016-01-06 NOTE — Progress Notes (Signed)
D: Patient is alert and oriented on the unit this shift. Patient attended  in groups today. Patient denies suicidal ideation, homicidal ideation, auditory or visual hallucinations at the present time.  A: Scheduled medications are administered to patient as per MD orders. Emotional support and encouragement are provided. Patient is maintained on q.15 minute safety checks. Patient is informed to notify staff with questions or concerns. R: No adverse medication reactions are noted. Patient is cooperative with medication administration and treatment plan today. Patient is non receptive,  fidigity with flight of ideas and cooperative on the unit at this time. Patient interacts well with others on the unit this shift. Patient contracts for safety at this time. Patient remains safe at this time.

## 2016-01-06 NOTE — Progress Notes (Signed)
[facility] MD Progress Note  01/06/2016 3:33 PM Cameron [name]  MRN:  [id number] Subjective:    The patient continues to be manic with hyperverbal and pressured speech. He has been compliant with psychotropic medications and today he has been fairly calm. He did get mildly agitated yesterday and was wanting to escape from the unit. The patient is denying any auditory or visual hallucinations but thought process is very disorganized. He denies any depressive symptoms or suicidal thoughts. He denies any current side effects from the ECT and is agreeable to continuing with ECT. The patient was able to give some reliable history and remember this Probation officer for many years ago. The patient has been social on the unit and no agitation or behavioral problems today. He is eating fairly well and slept 5 hours last night. No somatic complaints. Vital signs are stable.   Principal Problem: Bipolar I disorder, current or most recent episode manic, with psychotic features ([place]) Diagnosis:   Patient Active Problem List   Diagnosis Date Noted  . Bipolar affective disorder, current episode manic ([place]) [F31.9] 01/04/2016  . Cellulitis of leg, right [L03.115] 01/01/2016  . Bipolar I disorder, current or most recent episode manic, with psychotic features ([place]) [F31.2] 12/11/2015  . Pulmonary embolism ([place]) [I26.99] 12/04/2015  . Diabetes mellitus without complication ([place]) [id number]   . Hypercholesteremia [E78.00]   . CKD (chronic kidney disease), stage III [N18.3]   . Acute deep vein thrombosis (DVT) of femoral vein of right lower extremity (HCC) [I82.411]    Total Time spent with patient: 30 minutes  Past Psychiatric History: Long history of bipolar disorder multiple manic episodes. History of response to ECT.  Past Medical History:  Past Medical History:  Diagnosis Date  . Bipolar 1 disorder ([place])   . CKD (chronic kidney disease), stage III   . Diabetes mellitus without complication ([place])   . DVT (deep venous  thrombosis) ([place])   . Hypercholesteremia   . Hypertension     Past Surgical History:  Procedure Laterality Date  . APPENDECTOMY     Family History:  Family History  Problem Relation Age of Onset  . Stroke Other   . Diabetes Other    Family Psychiatric  History: Positive for bipolar disorder Social History:  History  Alcohol Use  . Yes    Comment: rarely     History  Drug Use No    Social History   Social History  . Marital status: Married    Spouse name: N/A  . Number of children: N/A  . Years of education: N/A   Social History Main Topics  . Smoking status: Never Smoker  . Smokeless tobacco: Never Used  . Alcohol use Yes     Comment: rarely  . Drug use: No  . Sexual activity: Not Asked   Other Topics Concern  . None   Social History Narrative  . None   Additional Social History:                         Sleep: Fair  Appetite:  Fair  Current Medications: Current Facility-Administered Medications  Medication Dose Route Frequency Provider Last Rate Last Dose  . acetaminophen (TYLENOL) tablet 650 mg  650 mg Oral Q6H PRN Gonzella Lex, MD   650 mg at 01/06/16 1500  . alum & mag hydroxide-simeth (MAALOX/MYLANTA) 200-200-20 MG/5ML suspension 30 mL  30 mL Oral Q4H PRN Gonzella Lex, MD      .  amoxicillin-clavulanate (AUGMENTIN) 875-125 MG per tablet 1 tablet  1 tablet Oral Q12H Gonzella Lex, MD   1 tablet at 01/06/16 0925  . atorvastatin (LIPITOR) tablet 40 mg  40 mg Oral q1800 Gonzella Lex, MD   40 mg at 01/05/16 1743  . carbamazepine (TEGRETOL) tablet 1,200 mg  1,200 mg Oral BID PC Gonzella Lex, MD   1,200 mg at 01/06/16 0925  . dextrose 5 % solution 250 mL  250 mL Intravenous Once Gonzella Lex, MD      . enalapril (VASOTEC) tablet 5 mg  5 mg Oral BID Gonzella Lex, MD   5 mg at 01/06/16 0925  . fenofibrate tablet 160 mg  160 mg Oral Daily Gonzella Lex, MD   160 mg at 01/06/16 0925  . haloperidol lactate (HALDOL) injection 20 mg  20 mg  Intramuscular Q6H PRN Gonzella Lex, MD      . insulin glargine (LANTUS) injection 60 Units  60 Units Subcutaneous QHS Gonzella Lex, MD   60 Units at 01/05/16 2153  . ketorolac (TORADOL) 30 MG/ML injection 30 mg  30 mg Intravenous Once Gonzella Lex, MD      . lamoTRIgine (LAMICTAL) tablet 200 mg  200 mg Oral Daily Gonzella Lex, MD   200 mg at 01/06/16 0925  . LORazepam (ATIVAN) injection 2 mg  2 mg Intramuscular Q6H PRN Gonzella Lex, MD      . magnesium hydroxide (MILK OF MAGNESIA) suspension 30 mL  30 mL Oral Daily PRN Gonzella Lex, MD      . QUEtiapine (SEROQUEL) tablet 1,200 mg  1,200 mg Oral QHS Gonzella Lex, MD   1,200 mg at 01/05/16 2149  . temazepam (RESTORIL) capsule 15 mg  15 mg Oral QHS Gonzella Lex, MD   15 mg at 01/05/16 2150  . warfarin (COUMADIN) tablet 7.5 mg  7.5 mg Oral q1800 Gonzella Lex, MD      . Warfarin - Pharmacist Dosing Inpatient   Does not apply q1800 Gonzella Lex, MD        Lab Results:  Results for orders placed or performed during the hospital encounter of 01/04/16 (from the past 48 hour(s))  Carbamazepine level, total     Status: Abnormal   Collection Time: 01/04/16  3:58 PM  Result Value Ref Range   Carbamazepine Lvl 2.9 (L) 4.0 - 12.0 ug/mL  Glucose, capillary     Status: None   Collection Time: 01/04/16  4:39 PM  Result Value Ref Range   Glucose-Capillary 94 65 - 99 mg/dL  Glucose, capillary     Status: Abnormal   Collection Time: 01/04/16  8:23 PM  Result Value Ref Range   Glucose-Capillary 144 (H) 65 - 99 mg/dL  Glucose, capillary     Status: Abnormal   Collection Time: 01/05/16  6:42 AM  Result Value Ref Range   Glucose-Capillary 153 (H) 65 - 99 mg/dL  Glucose, capillary     Status: None   Collection Time: 01/05/16 12:30 PM  Result Value Ref Range   Glucose-Capillary 86 65 - 99 mg/dL  Protime-INR     Status: Abnormal   Collection Time: 01/05/16  1:22 PM  Result Value Ref Range   Prothrombin Time 33.4 (H) 11.4 - 15.2 seconds     INR 3.19   Glucose, capillary     Status: Abnormal   Collection Time: 01/05/16  4:27 PM  Result Value Ref Range  Glucose-Capillary 128 (H) 65 - 99 mg/dL  Glucose, capillary     Status: Abnormal   Collection Time: 01/05/16  8:26 PM  Result Value Ref Range   Glucose-Capillary 117 (H) 65 - 99 mg/dL   Comment 1 Notify RN   Glucose, capillary     Status: None   Collection Time: 01/06/16  6:47 AM  Result Value Ref Range   Glucose-Capillary 73 65 - 99 mg/dL   Comment 1 Notify RN   Protime-INR     Status: Abnormal   Collection Time: 01/06/16  7:12 AM  Result Value Ref Range   Prothrombin Time 32.1 (H) 11.4 - 15.2 seconds   INR 3.04   CBC     Status: Abnormal   Collection Time: 01/06/16  7:12 AM  Result Value Ref Range   WBC 3.3 (L) 3.8 - 10.6 K/uL   RBC 3.95 (L) 4.40 - 5.90 MIL/uL   Hemoglobin 11.3 (L) 13.0 - 18.0 g/dL   HCT 33.2 (L) 40.0 - 52.0 %   MCV 84.3 80.0 - 100.0 fL   MCH 28.6 26.0 - 34.0 pg   MCHC 33.9 32.0 - 36.0 g/dL   RDW 12.8 11.5 - 14.5 %   Platelets 286 150 - 440 K/uL  Glucose, capillary     Status: None   Collection Time: 01/06/16 11:40 AM  Result Value Ref Range   Glucose-Capillary 82 65 - 99 mg/dL    Blood Alcohol level:  Lab Results  Component Value Date   ETH <5 [id number]    Metabolic Disorder Labs: Lab Results  Component Value Date   HGBA1C 7.4 (H) 01/02/2016   MPG 192 12/04/2015   MPG 318 (H) 08/24/2010   Lab Results  Component Value Date   PROLACTIN 4.4 12/11/2015   Lab Results  Component Value Date   CHOL 143 12/11/2015   TRIG 286 (H) 12/11/2015   HDL 32 (L) 12/11/2015   CHOLHDL 4.5 12/11/2015   VLDL 57 (H) 12/11/2015   LDLCALC 54 12/11/2015   LDLCALC 56 12/05/2015    Physical Findings: AIMS: Facial and Oral Movements Muscles of Facial Expression: None, normal Lips and Perioral Area: None, normal Jaw: None, normal Tongue: None, normal,Extremity Movements Upper (arms, wrists, hands, fingers): None, normal Lower (legs, knees,  ankles, toes): None, normal, Trunk Movements Neck, shoulders, hips: None, normal, Overall Severity Severity of abnormal movements (highest score from questions above): None, normal Incapacitation due to abnormal movements: None, normal Patient's awareness of abnormal movements (rate only patient's report): No Awareness, Dental Status Current problems with teeth and/or dentures?: No Does patient usually wear dentures?: No  CIWA:    COWS:     Musculoskeletal: Strength & Muscle Tone: within normal limits Gait & Station: normal Patient leans: N/A  Psychiatric Specialty Exam: Physical Exam  Nursing note and vitals reviewed. Constitutional: He appears well-developed and well-nourished.  HENT:  Head: Normocephalic and atraumatic.  Eyes: Conjunctivae are normal. Pupils are equal, round, and reactive to light.  Neck: Normal range of motion.  Cardiovascular: Normal heart sounds.   Respiratory: Effort normal. No respiratory distress.  GI: Soft.  Musculoskeletal: Normal range of motion.  Neurological: He is alert.  Skin: Skin is warm and dry.     Psychiatric: His affect is labile and inappropriate. His speech is tangential. He is hyperactive. Thought content is delusional. He expresses impulsivity. He expresses no homicidal and no suicidal ideation. He exhibits abnormal recent memory. He is inattentive.    Review of Systems  Constitutional: Negative.  Negative for chills, fever, malaise/fatigue and weight loss.  HENT: Negative.  Negative for hearing loss, nosebleeds, sore throat and tinnitus.   Eyes: Negative.  Negative for blurred vision and double vision.  Respiratory: Negative.  Negative for cough, hemoptysis, sputum production, shortness of breath and wheezing.   Cardiovascular: Negative.  Negative for chest pain, palpitations and claudication.  Gastrointestinal: Negative.  Negative for abdominal pain, blood in stool, constipation, diarrhea, heartburn, nausea and vomiting.    Genitourinary: Negative for dysuria, frequency and urgency.  Musculoskeletal: Negative.  Negative for back pain, falls, joint pain, myalgias and neck pain.  Skin: Negative.  Negative for itching and rash.  Neurological: Negative.  Negative for dizziness, tingling, tremors, focal weakness, seizures and headaches.  Endo/Heme/Allergies: Negative for environmental allergies. Does not bruise/bleed easily.  Psychiatric/Behavioral: Negative for depression, hallucinations, memory loss, substance abuse and suicidal ideas. The patient is not nervous/anxious and does not have insomnia.     Blood pressure 112/81, pulse 85, temperature 98.5 F (36.9 C), temperature source Oral, resp. rate 18, height 6' (1.829 m), weight 127 kg (280 lb), SpO2 98 %.Body mass index is 37.97 kg/m.  General Appearance: Disheveled  Eye Contact:  Fair  Speech:  Pressured  Volume:  Increased  Mood:  Euphoric  Affect:  Labile  Thought Process:  Disorganized  Orientation:  Full (Time, Place, and Person)  Thought Content:  Illogical, Delusions, Rumination and Tangential  Suicidal Thoughts:  No  Homicidal Thoughts:  No  Memory:  Immediate;   Fair Recent;   Poor Remote;   Fair  Judgement:  Impaired  Insight:  Shallow  Psychomotor Activity:  Increased  Concentration:  Concentration: Poor  Recall:  [place] of Knowledge:  Good  Language:  Good  Akathisia:  No  Handed:  Right  AIMS (if indicated):     Assets:  Financial Resources/Insurance Housing Social Support  ADL's:  Impaired  Cognition:  Impaired,  Mild  Sleep:  Number of Hours: 5     Treatment Plan Summary:  Mr. Cameron [name] 50 year old married Caucasian male with a history of bipolar disorder who was admitted to the hospital for ECT treatment for mania. He was just transferred down to the medicine floor after treatment for cellulitis of the leg.  Bipolar disorder: Most recent episode manic: The patient is currently receiving ECT treatment which has been  helpful for him in the past. He is on a number of different psychotropic medications at very high dosages but mania persists. He will continue on Tegretol 1200 mg by mouth twice a day, Lamictal 200 mg by mouth daily and Seroquel 1200 mg by mouth nightly. Patient also has Restoril 15 mg by mouth nightly for insomnia. Will have to monitor Tegretol level and liver enzymes. We'll have to monitor hemoglobin A1c, lipid panel and prolactin level as well. He is currently receiving ECT 3 times a week and so far denies any side effects from the ECT. He has when necessary Haldol IM and Ativan IM at pretty high doses as that appears to be effective for him. He remains disorganized and manic.  Hyperlipidemia: We'll plan to continue Lipitor 40 mg by mouth daily and fenofibrate 160 mg by mouth daily  Cellulitis of the leg: Will continue Augmentin 1 tablet by mouth twice a day. Cellulitis is slowly improving.  History of pulmonary embolism: We'll continue Coumadin 8 mg by mouth nightly.  Disposition: The patient will be discharged home with his wife because he does have a stable living situation. Psychotropic  medication management follow-up appointment as well as individual therapy will be scheduled with Dr. Thurmond Butts.  Jay Schlichter, MD 01/06/2016, 3:33 PM

## 2016-01-06 NOTE — BHH Counselor (Signed)
CSW attempted PSA, but pt was too acute as evidenced by the pt exhibiting pressured speech, presenting as manic and disorganized thinking.  CSW will attempted PSA with pt tomorrow.   Kahlyn Shippey G. [place], [facility] 01/06/2016 3:49 PM

## 2016-01-06 NOTE — Progress Notes (Signed)
Standing in doorway.  Sitter at side

## 2016-01-06 NOTE — Plan of Care (Signed)
Problem: Coping: Goal: Ability to cope will improve Outcome: Progressing Patient shows signs of using coping skills on the unit at this time [facility] RN

## 2016-01-06 NOTE — Progress Notes (Signed)
Standing in doorway talking with sitter

## 2016-01-06 NOTE — Progress Notes (Signed)
ANTICOAGULATION CONSULT NOTE - Follow Up Consult  Pharmacy Consult for Warfarin Indication: VTE treatment  Allergies  Allergen Reactions  . Asa [Aspirin] Other (See Comments)    Patient tolerates LOW DOSE ASPIRIN.    Patient Measurements: Height: 6' (182.9 cm) Weight: 280 lb (127 kg) IBW/kg (Calculated) : 77.6  Vital Signs: Temp: 98.5 F (36.9 C) (08/05 0714) Temp Source: Oral (08/05 0714) BP: 112/81 (08/05 0714) Pulse Rate: 85 (08/05 0714)  Labs:  Recent Labs  01/04/16 0451 01/05/16 1322 01/06/16 0712  HGB  --   --  11.3*  HCT  --   --  33.2*  PLT  --   --  286  LABPROT 25.4* 33.4* 32.1*  INR 2.27 3.19 3.04  CREATININE 1.60*  --   --    Estimated Creatinine Clearance: 75.2 mL/min (by C-G formula based on SCr of 1.6 mg/dL).  Medical History: Past Medical History:  Diagnosis Date  . Bipolar 1 disorder ([place])   . CKD (chronic kidney disease), stage III   . Diabetes mellitus without complication ([place])   . DVT (deep venous thrombosis) ([place])   . Hypercholesteremia   . Hypertension    Medications:  Warfarin 10 mg po daily  Assessment: 67 yom admitted to ED [facility] with manic symptoms. Recently discharged from [facility] with DVT/PE, had been bridging LMWH and VKA prior to admission.  INR History: 7/9: INR - 1.99 7/10: no INR- warfarin 12.5 mg po daily at 0200 7/11: INR - 2.97- warfarin held 7/12: INR - 1.92 - warfarin 10 mg 7/13: INR - 1.95 - warfarin 10 mg 7/14: INR - 2.56 - warfarin 5 mg 7/15: INR - 2.73 - warfarin 5 mg 7/16: INR - 2.39 - warfarin 7.5 mg 7/17: INR - 2.22 - warfarin 7.5 mg 7/18: INR - 2.53 - warfarin 7.5 mg 7/19: INR - 2.75 - warfarin 7.5 mg 7/20: INR - 2.99 - warfarin 6.5 mg 7/21: INR - 2.64 - warfarin 6.5 mg  7/22: INR: 2.44; warfarin 8  7/23: INR: 2.60;  Warfarin 10mg  7/24: INR 2.44 - warfarin 10mg  7/25:  INR 3.22 - warfarin 10mg  7/26:  INR 3.08 - warfarin 8mg  7/27:  INR 3.24 - warfarin 8 mg 7/28:  INR 2.79 - 8 mg 7/29:  INR 2.74- 8  mg 7/30:  INR 3.19 - 7 mg 7/31:  INR 2.73  7.5mg  8/1:    INR 3.13  7.5mg  8/2:    INR 2.74  7.5mg  8/3     INR 2.27  7.5mg  8/4     INR 3.19  8 mg 8/5     INR 3.04  Goal of Therapy:  INR 2-3 Monitor platelets by anticoagulation protocol: Yes   Plan:  INR is supratherapeutic today. Decrease to 7.5 mg daily and follow up with INR in the AM  (Patient with orders for carbamazepine which interacts with warfarin to decrease the INR.  Will follow INR closely.)   Pharmacy will continue to follow.   [place] Clinical Pharmacist  01/06/2016,2:43 PM

## 2016-01-06 NOTE — BHH Group Notes (Signed)
[place] LCSW Group Therapy  01/06/2016 3:19 PM  Type of Therapy:  Group Therapy  Participation Level:  Active  Participation Quality:  Intrusive  Affect:  Excited  Cognitive:  Lacking  Insight:  Distracting  Engagement in Therapy:  Off Topic  Modes of Intervention:  Activity, Discussion, Education and Support   Summary of Progress/Problems: Self care: Patients discussed self care and how it impacts them. Patients were asked to define self care in their own words. They discussed what aspects in their lives has influenced their self care. Patients also discussed self care in the areas of self regulation/control, hygiene/appearance, sleep/relaxation, healthy leisure, healthy eating habits, exercise, inner peace/spirituality, self improvement, sobriety, and health management. They were challenged to identify changes that are needed in order to improve self care. Pt was in a sharing mood in but had no insight to the group discussion. Pt would often interrupt others and need redirection to stay on topic.   Luz Burcher G. [place], [place] 01/06/2016, 3:22 PM

## 2016-01-06 NOTE — Progress Notes (Signed)
In shower.  Sitter at bedside

## 2016-01-06 NOTE — Progress Notes (Signed)
In group

## 2016-01-06 NOTE — BHH Group Notes (Addendum)
[place] LCSW Group Therapy   01/06/2016 9:30 AM *Late entry  Type of Therapy: Group Therapy   Participation Level: Active   Participation Quality: Attentive, Sharing and Supportive   Affect: Appropriate   Cognitive: Alert and Oriented   Insight: Developing/Improving and Engaged   Engagement in Therapy: Developing/Improving and Engaged   Modes of Intervention: Clarification, Confrontation, Discussion, Education, Exploration, Limit-setting, Orientation, Problem-solving, Rapport Building, Art therapist, Socialization and Support   Summary of Progress/Problems: The topic for today was feelings about relapse. Pt did not attend group due to receiving ECT treatment during the session on another unit.  [name]. Eris Breck, MSW, LCSWA, LCAS

## 2016-01-06 NOTE — Progress Notes (Signed)
In dayroom. Sitter at side

## 2016-01-06 NOTE — Progress Notes (Signed)
D:  Continues to have 1:1 sitter for safety.  Patient denies SI/HI/AVH.  Patient continues to be hype rverbal with flight of ideas.   A:  Scheduled medications given. Redirected as needed.  R:  Medication compliant.  Safety maintained.

## 2016-01-06 NOTE — Progress Notes (Signed)
Medication room getting CBG done.  Sitter by side

## 2016-01-06 NOTE — Progress Notes (Signed)
NA meeting in craft room

## 2016-01-06 NOTE — Progress Notes (Signed)
Patient blood sugar this am is 73 graham crackers and peanut butter given pt in bed [employer] RN

## 2016-01-06 NOTE — Progress Notes (Signed)
In community room eating breakfast.  Sitter with patient

## 2016-01-06 NOTE — Progress Notes (Signed)
Sitting in dayroom with sitter at side.

## 2016-01-07 ENCOUNTER — Other Ambulatory Visit: Payer: Self-pay | Admitting: Psychiatry

## 2016-01-07 LAB — CBC
HCT: 34.3 % — ABNORMAL LOW (ref 40.0–52.0)
Hemoglobin: 11.9 g/dL — ABNORMAL LOW (ref 13.0–18.0)
MCH: 29.4 pg (ref 26.0–34.0)
MCHC: 34.7 g/dL (ref 32.0–36.0)
MCV: 84.9 fL (ref 80.0–100.0)
Platelets: 281 10*3/uL (ref 150–440)
RBC: 4.04 MIL/uL — ABNORMAL LOW (ref 4.40–5.90)
RDW: 13.2 % (ref 11.5–14.5)
WBC: 2.7 10*3/uL — ABNORMAL LOW (ref 3.8–10.6)

## 2016-01-07 LAB — PROTIME-INR
INR: 3.05
PROTHROMBIN TIME: 32.2 s — AB (ref 11.4–15.2)

## 2016-01-07 LAB — GLUCOSE, CAPILLARY
GLUCOSE-CAPILLARY: 107 mg/dL — AB (ref 65–99)
GLUCOSE-CAPILLARY: 88 mg/dL (ref 65–99)
GLUCOSE-CAPILLARY: 82 mg/dL (ref 65–99)
Glucose-Capillary: 97 mg/dL (ref 65–99)
Glucose-Capillary: 125 mg/dL — ABNORMAL HIGH (ref 65–99)

## 2016-01-07 NOTE — Progress Notes (Signed)
Patient outside interacting with peers.  Patient remains one to one with sitter.  Patient voices no concerns or complaints at this time.

## 2016-01-07 NOTE — BHH Group Notes (Signed)
[place] LCSW Group Therapy  01/07/2016 2:16 PM  Type of Therapy:  Group Therapy  Participation Level:  Active  Participation Quality:  Sharing  Affect:  Excited  Cognitive:  Disorganized  Insight:  Lacking, Off Topic and Poor  Engagement in Therapy:  Engaged  Modes of Intervention:  Activity, Discussion, Education and Support  Summary of Progress/Problems: Emotional Regulation: Patients will identify both negative and positive emotions. They will discuss emotions they have difficulty regulating and how they impact their lives. Patients will be asked to identify healthy coping skills to combat unhealthy reactions to negative emotions. The group focused on the anger and how to manage anger appropriately. Pt was sharing during group but would make comments that were off topic from the group discussion. Pt was easily redirectable but would get off topic consistently throughout group. Pt had no insight in group discussion.   Raciel Caffrey G. [place], [place] 01/07/2016, 2:21 PM

## 2016-01-07 NOTE — Progress Notes (Signed)
Patient ambulating in hallway.  Patient continues to be 1:1 with sitter.  Patient continues to be hyper verbal.  Patient voices no complaints or concerns at this time.

## 2016-01-07 NOTE — Progress Notes (Signed)
In room, sitter present.

## 2016-01-07 NOTE — Plan of Care (Signed)
Problem: Education: Goal: Mental status will improve Outcome: Not Progressing Pt remains disorganized.

## 2016-01-07 NOTE — Progress Notes (Signed)
D:  Patient remains hyper verbal and requires a sitter for safety.   A:  Patient encouraged to attend groups.  Patient encouraged to rest. R:  Patient attended group sessions held.  Patient continues to be hyper verbal and restless.

## 2016-01-07 NOTE — Progress Notes (Signed)
Patient in room.  Patient continues to be 1:1 with sitter.  Patient continues to be hyper verbal.  Patient voices no complaints or concerns at this time.

## 2016-01-07 NOTE — Progress Notes (Signed)
Community room, no interaction with peers. Talking with sitter.

## 2016-01-07 NOTE — Progress Notes (Signed)
Patient ambulating in hallway.  Patient remains 1:1 with sitter.  Patient voices no concerns or complaints at this time.

## 2016-01-07 NOTE — Progress Notes (Signed)
Patient showering.  Patient remains 1:1 with a sitter.  Patient voices no complaints or concerns at this time.

## 2016-01-07 NOTE — Progress Notes (Signed)
Patient in hallway.  Patient continues to be hyper verbal.  Patient continues to be 1:1 with sitter.  Patient voices no complaints or concerns at this time.

## 2016-01-07 NOTE — BHH Group Notes (Signed)
[place] Group Notes:  (Nursing/MHT/Case Management/Adjunct)  Date:  01/07/2016  Time:  10:59 PM  Type of Therapy:  Group Therapy  Participation Level:  Active  Participation Quality:  Redirectable  Affect:  Excited  Cognitive:  Alert and Disorganized  Insight:  Improving and Lacking  Engagement in Group:  Developing/Improving and Lacking  Modes of Intervention:  Discussion  Summary of Progress/Problems:Pt stated that his goal was to do whatever he needed to do so he can go home to his family. Staff suggested that if ECT treatment was offered he'd consider completing it. Pt then begin to get off topic but when redirected to the conversation about  ECT. He said that he would be willing to do it again.   Jenetta Downer Leslea Vowles 01/07/2016, 10:59 PM

## 2016-01-07 NOTE — Progress Notes (Signed)
Patient eating dinner.  Patient remains 1:1 with a sitter.  Patient voices no complaints or concerns at this time.

## 2016-01-07 NOTE — Plan of Care (Signed)
Problem: Health Behavior/Discharge Planning: Goal: Compliance with prescribed medication regimen will improve Outcome: Progressing Pt has been compliant with medications.

## 2016-01-07 NOTE — Progress Notes (Signed)
Patient in dayroom eating breakfast.  Patient voices no complaints or concerns at this time.  Patient continues to be 1:1 with sitter.

## 2016-01-07 NOTE — Plan of Care (Signed)
Problem: Safety: Goal: Ability to remain free from injury will improve Outcome: Progressing Pt able to remain free from injury at this time [employer]

## 2016-01-07 NOTE — Progress Notes (Signed)
[facility] MD Progress Note  01/07/2016 5:17 PM RUDY AMINOV  MRN:  [id number] Subjective:    The patient continues to be manic and is hyperverbal but thoughts are little more organized today. Speech is still pressured and he has to be redirected during the conversation multiple times. He has not been as agitated as he was yesterday. The patient has remained with a one-to-one sitter. He denies any current auditory or visual hallucinations. He denies any depressive symptoms, active or passive suicidal thoughts. The patient is agreeable to continuing with ECT treatment. He has been social on the unit and attending groups regularly. Per nursing, he slept 5 hours last night. Vital signs are stable. No somatic complaints. He has remained with a one-to-one sitter for safety. Cellulitis has continued to improve.  Principal Problem: Bipolar I disorder, current or most recent episode manic, with psychotic features ([place]) Diagnosis:   Patient Active Problem List   Diagnosis Date Noted  . Bipolar affective disorder, current episode manic ([place]) [F31.9] 01/04/2016  . Cellulitis of leg, right [L03.115] 01/01/2016  . Bipolar I disorder, current or most recent episode manic, with psychotic features ([place]) [F31.2] 12/11/2015  . Pulmonary embolism ([place]) [I26.99] 12/04/2015  . Diabetes mellitus without complication ([place]) [id number]   . Hypercholesteremia [E78.00]   . CKD (chronic kidney disease), stage III [N18.3]   . Acute deep vein thrombosis (DVT) of femoral vein of right lower extremity (HCC) [I82.411]    Total Time spent with patient: 20 minutes  Past Psychiatric History: Long history of bipolar disorder multiple manic episodes. History of response to ECT.  Past Medical History:  Past Medical History:  Diagnosis Date  . Bipolar 1 disorder ([place])   . CKD (chronic kidney disease), stage III   . Diabetes mellitus without complication ([place])   . DVT (deep venous thrombosis) ([place])   . Hypercholesteremia   .  Hypertension     Past Surgical History:  Procedure Laterality Date  . APPENDECTOMY     Family History:  Family History  Problem Relation Age of Onset  . Stroke Other   . Diabetes Other    Family Psychiatric  History: Positive for bipolar disorder Social History:  History  Alcohol Use  . Yes    Comment: rarely     History  Drug Use No    Social History   Social History  . Marital status: Married    Spouse name: N/A  . Number of children: N/A  . Years of education: N/A   Social History Main Topics  . Smoking status: Never Smoker  . Smokeless tobacco: Never Used  . Alcohol use Yes     Comment: rarely  . Drug use: No  . Sexual activity: Not Asked   Other Topics Concern  . None   Social History Narrative  . None   Additional Social History:                         Sleep: Fair  Appetite:  Fair  Current Medications: Current Facility-Administered Medications  Medication Dose Route Frequency Provider Last Rate Last Dose  . acetaminophen (TYLENOL) tablet 650 mg  650 mg Oral Q6H PRN Gonzella Lex, MD   650 mg at 01/06/16 1500  . alum & mag hydroxide-simeth (MAALOX/MYLANTA) 200-200-20 MG/5ML suspension 30 mL  30 mL Oral Q4H PRN Gonzella Lex, MD      . amoxicillin-clavulanate (AUGMENTIN) 875-125 MG per tablet 1 tablet  1 tablet Oral  Q12H Gonzella Lex, MD   1 tablet at 01/07/16 0909  . atorvastatin (LIPITOR) tablet 40 mg  40 mg Oral q1800 Gonzella Lex, MD   40 mg at 01/07/16 1714  . carbamazepine (TEGRETOL) tablet 1,200 mg  1,200 mg Oral BID PC Gonzella Lex, MD   1,200 mg at 01/07/16 1714  . dextrose 5 % solution 250 mL  250 mL Intravenous Once Gonzella Lex, MD      . enalapril (VASOTEC) tablet 5 mg  5 mg Oral BID Gonzella Lex, MD   5 mg at 01/07/16 0909  . fenofibrate tablet 160 mg  160 mg Oral Daily Gonzella Lex, MD   160 mg at 01/07/16 0909  . haloperidol lactate (HALDOL) injection 20 mg  20 mg Intramuscular Q6H PRN Gonzella Lex, MD        . insulin glargine (LANTUS) injection 60 Units  60 Units Subcutaneous QHS Gonzella Lex, MD   60 Units at 01/06/16 2128  . ketorolac (TORADOL) 30 MG/ML injection 30 mg  30 mg Intravenous Once Gonzella Lex, MD      . lamoTRIgine (LAMICTAL) tablet 200 mg  200 mg Oral Daily Gonzella Lex, MD   200 mg at 01/07/16 0909  . LORazepam (ATIVAN) injection 2 mg  2 mg Intramuscular Q6H PRN Gonzella Lex, MD      . magnesium hydroxide (MILK OF MAGNESIA) suspension 30 mL  30 mL Oral Daily PRN Gonzella Lex, MD      . QUEtiapine (SEROQUEL) tablet 1,200 mg  1,200 mg Oral QHS Gonzella Lex, MD   1,200 mg at 01/06/16 2128  . temazepam (RESTORIL) capsule 15 mg  15 mg Oral QHS Gonzella Lex, MD   15 mg at 01/06/16 2129  . warfarin (COUMADIN) tablet 7.5 mg  7.5 mg Oral q1800 Gonzella Lex, MD   7.5 mg at 01/07/16 1714  . Warfarin - Pharmacist Dosing Inpatient   Does not apply q1800 Gonzella Lex, MD        Lab Results:  Results for orders placed or performed during the hospital encounter of 01/04/16 (from the past 48 hour(s))  Glucose, capillary     Status: Abnormal   Collection Time: 01/05/16  8:26 PM  Result Value Ref Range   Glucose-Capillary 117 (H) 65 - 99 mg/dL   Comment 1 Notify RN   Glucose, capillary     Status: None   Collection Time: 01/06/16  6:47 AM  Result Value Ref Range   Glucose-Capillary 73 65 - 99 mg/dL   Comment 1 Notify RN   Protime-INR     Status: Abnormal   Collection Time: 01/06/16  7:12 AM  Result Value Ref Range   Prothrombin Time 32.1 (H) 11.4 - 15.2 seconds   INR 3.04   CBC     Status: Abnormal   Collection Time: 01/06/16  7:12 AM  Result Value Ref Range   WBC 3.3 (L) 3.8 - 10.6 K/uL   RBC 3.95 (L) 4.40 - 5.90 MIL/uL   Hemoglobin 11.3 (L) 13.0 - 18.0 g/dL   HCT 33.2 (L) 40.0 - 52.0 %   MCV 84.3 80.0 - 100.0 fL   MCH 28.6 26.0 - 34.0 pg   MCHC 33.9 32.0 - 36.0 g/dL   RDW 12.8 11.5 - 14.5 %   Platelets 286 150 - 440 K/uL  Glucose, capillary     Status: None    Collection Time: 01/06/16 11:40  AM  Result Value Ref Range   Glucose-Capillary 82 65 - 99 mg/dL  Glucose, capillary     Status: Abnormal   Collection Time: 01/06/16  5:08 PM  Result Value Ref Range   Glucose-Capillary 64 (L) 65 - 99 mg/dL  Glucose, capillary     Status: Abnormal   Collection Time: 01/06/16  7:42 PM  Result Value Ref Range   Glucose-Capillary 137 (H) 65 - 99 mg/dL  Glucose, capillary     Status: Abnormal   Collection Time: 01/07/16  2:48 AM  Result Value Ref Range   Glucose-Capillary 107 (H) 65 - 99 mg/dL  Protime-INR     Status: Abnormal   Collection Time: 01/07/16  6:18 AM  Result Value Ref Range   Prothrombin Time 32.2 (H) 11.4 - 15.2 seconds   INR 3.05   CBC     Status: Abnormal   Collection Time: 01/07/16  6:18 AM  Result Value Ref Range   WBC 2.7 (L) 3.8 - 10.6 K/uL   RBC 4.04 (L) 4.40 - 5.90 MIL/uL   Hemoglobin 11.9 (L) 13.0 - 18.0 g/dL   HCT 34.3 (L) 40.0 - 52.0 %   MCV 84.9 80.0 - 100.0 fL   MCH 29.4 26.0 - 34.0 pg   MCHC 34.7 32.0 - 36.0 g/dL   RDW 13.2 11.5 - 14.5 %   Platelets 281 150 - 440 K/uL  Glucose, capillary     Status: None   Collection Time: 01/07/16  6:26 AM  Result Value Ref Range   Glucose-Capillary 88 65 - 99 mg/dL  Glucose, capillary     Status: None   Collection Time: 01/07/16 12:00 PM  Result Value Ref Range   Glucose-Capillary 97 65 - 99 mg/dL   Comment 1 Notify RN   Glucose, capillary     Status: None   Collection Time: 01/07/16  4:45 PM  Result Value Ref Range   Glucose-Capillary 82 65 - 99 mg/dL   Comment 1 Notify RN     Blood Alcohol level:  Lab Results  Component Value Date   ETH <5 [id number]    Metabolic Disorder Labs: Lab Results  Component Value Date   HGBA1C 7.4 (H) 01/02/2016   MPG 192 12/04/2015   MPG 318 (H) 08/24/2010   Lab Results  Component Value Date   PROLACTIN 4.4 12/11/2015   Lab Results  Component Value Date   CHOL 143 12/11/2015   TRIG 286 (H) 12/11/2015   HDL 32 (L) 12/11/2015    CHOLHDL 4.5 12/11/2015   VLDL 57 (H) 12/11/2015   LDLCALC 54 12/11/2015   LDLCALC 56 12/05/2015    Physical Findings: AIMS: Facial and Oral Movements Muscles of Facial Expression: None, normal Lips and Perioral Area: None, normal Jaw: None, normal Tongue: None, normal,Extremity Movements Upper (arms, wrists, hands, fingers): None, normal Lower (legs, knees, ankles, toes): None, normal, Trunk Movements Neck, shoulders, hips: None, normal, Overall Severity Severity of abnormal movements (highest score from questions above): None, normal Incapacitation due to abnormal movements: None, normal Patient's awareness of abnormal movements (rate only patient's report): No Awareness, Dental Status Current problems with teeth and/or dentures?: No Does patient usually wear dentures?: No  CIWA:    COWS:     Musculoskeletal: Strength & Muscle Tone: within normal limits Gait & Station: normal Patient leans: N/A  Psychiatric Specialty Exam: Physical Exam  Nursing note and vitals reviewed. Constitutional: He appears well-developed and well-nourished.  HENT:  Head: Normocephalic and atraumatic.  Eyes: Conjunctivae are  normal. Pupils are equal, round, and reactive to light.  Neck: Normal range of motion.  Cardiovascular: Normal heart sounds.   Respiratory: Effort normal. No respiratory distress.  GI: Soft.  Musculoskeletal: Normal range of motion.  Neurological: He is alert.  Skin: Skin is warm and dry.     Psychiatric: His affect is labile and inappropriate. His speech is tangential. He is hyperactive. Thought content is delusional. He expresses impulsivity. He expresses no homicidal and no suicidal ideation. He exhibits abnormal recent memory. He is inattentive.    Review of Systems  Constitutional: Negative.  Negative for chills, fever, malaise/fatigue and weight loss.  HENT: Negative.  Negative for hearing loss, nosebleeds, sore throat and tinnitus.   Eyes: Negative.  Negative for  blurred vision and double vision.  Respiratory: Negative.  Negative for cough, hemoptysis, sputum production, shortness of breath and wheezing.   Cardiovascular: Negative.  Negative for chest pain, palpitations and claudication.  Gastrointestinal: Negative.  Negative for abdominal pain, blood in stool, constipation, diarrhea, heartburn, nausea and vomiting.  Genitourinary: Negative for dysuria, frequency and urgency.  Musculoskeletal: Negative.  Negative for back pain, falls, joint pain, myalgias and neck pain.  Skin: Negative.  Negative for itching and rash.  Neurological: Negative.  Negative for dizziness, tingling, tremors, focal weakness, seizures and headaches.  Endo/Heme/Allergies: Negative for environmental allergies. Does not bruise/bleed easily.  Psychiatric/Behavioral: Negative for depression, hallucinations, memory loss, substance abuse and suicidal ideas. The patient is not nervous/anxious and does not have insomnia.     Blood pressure 119/78, pulse 90, temperature 98.2 F (36.8 C), temperature source Oral, resp. rate 18, height 6' (1.829 m), weight 127 kg (280 lb), SpO2 98 %.Body mass index is 37.97 kg/m.  General Appearance: Disheveled  Eye Contact:  Fair  Speech:  Pressured  Volume:  Increased  Mood:  Euphoric  Affect:  Labile  Thought Process:  Disorganized  Orientation:  Full (Time, Place, and Person)  Thought Content:  Illogical, Delusions, Rumination and Tangential  Suicidal Thoughts:  No  Homicidal Thoughts:  No  Memory:  Immediate;   Fair Recent;   Poor Remote;   Fair  Judgement:  Impaired  Insight:  Shallow  Psychomotor Activity:  Increased  Concentration:  Concentration: Poor  Recall:  [place] Knowledge:  Good  Language:  Good  Akathisia:  No  Handed:  Right  AIMS (if indicated):     Assets:  Financial Resources/Insurance Housing Social Support  ADL's:  Impaired  Cognition:  Impaired,  Mild  Sleep:  Number of Hours: 5     Treatment Plan  Summary:  Mr. Almonte 50 year old married Caucasian male with a history of bipolar disorder who was admitted to the hospital for ECT treatment for mania. He was just transferred down to the medicine floor after treatment for cellulitis of the leg.  Bipolar disorder: Most recent episode manic: The patient is currently receiving ECT treatment which has been helpful for him in the past. He is on a number of different psychotropic medications at very high dosages but mania persists. He will continue on Tegretol 1200 mg by mouth twice a day, Lamictal 200 mg by mouth daily and Seroquel 1200 mg by mouth nightly. Patient also has Restoril 15 mg by mouth nightly for insomnia. Will have to monitor Tegretol level and liver enzymes. We'll have to monitor hemoglobin A1c, lipid panel and prolactin level as well. He is currently receiving ECT 3 times a week and so far denies any side effects from  the ECT. He has when necessary Haldol IM and Ativan IM at pretty high doses as that appears to be effective for him. He remains disorganized and manic.  Hyperlipidemia: We'll plan to continue Lipitor 40 mg by mouth daily and fenofibrate 160 mg by mouth daily  Cellulitis of the leg: Will continue Augmentin 1 tablet by mouth twice a day. Cellulitis is slowly improving.  History of pulmonary embolism: We'll continue Coumadin 8 mg by mouth nightly.  Disposition: The patient will be discharged home with his wife because he does have a stable living situation. Psychotropic medication management follow-up appointment as well as individual therapy will be scheduled with Dr. Thurmond Butts.  Jay Schlichter, MD 01/07/2016, 5:17 PM

## 2016-01-07 NOTE — Progress Notes (Signed)
ANTICOAGULATION CONSULT NOTE - Follow Up Consult  Pharmacy Consult for Warfarin Indication: VTE treatment  Allergies  Allergen Reactions  . Asa [Aspirin] Other (See Comments)    Patient tolerates LOW DOSE ASPIRIN.    Patient Measurements: Height: 6' (182.9 cm) Weight: 280 lb (127 kg) IBW/kg (Calculated) : 77.6  Vital Signs: Temp: 98.2 F (36.8 C) (08/06 0710) Temp Source: Oral (08/06 0710) BP: 119/78 (08/06 0710) Pulse Rate: 90 (08/06 0710)  Labs:  Recent Labs  01/05/16 1322 01/06/16 0712 01/07/16 0618  HGB  --  11.3* 11.9*  HCT  --  33.2* 34.3*  PLT  --  286 281  LABPROT 33.4* 32.1* 32.2*  INR 3.19 3.04 3.05   Estimated Creatinine Clearance: 75.2 mL/min (by C-G formula based on SCr of 1.6 mg/dL).  Medical History: Past Medical History:  Diagnosis Date  . Bipolar 1 disorder ([place])   . CKD (chronic kidney disease), stage III   . Diabetes mellitus without complication ([place])   . DVT (deep venous thrombosis) ([place])   . Hypercholesteremia   . Hypertension    Medications:  Warfarin 10 mg po daily  Assessment: 70 yom admitted to ED [facility] with manic symptoms. Recently discharged from [facility] with DVT/PE, had been bridging LMWH and VKA prior to admission.  INR History: 7/9: INR - 1.99 7/10: no INR- warfarin 12.5 mg po daily at 0200 7/11: INR - 2.97- warfarin held 7/12: INR - 1.92 - warfarin 10 mg 7/13: INR - 1.95 - warfarin 10 mg 7/14: INR - 2.56 - warfarin 5 mg 7/15: INR - 2.73 - warfarin 5 mg 7/16: INR - 2.39 - warfarin 7.5 mg 7/17: INR - 2.22 - warfarin 7.5 mg 7/18: INR - 2.53 - warfarin 7.5 mg 7/19: INR - 2.75 - warfarin 7.5 mg 7/20: INR - 2.99 - warfarin 6.5 mg 7/21: INR - 2.64 - warfarin 6.5 mg  7/22: INR: 2.44; warfarin 8  7/23: INR: 2.60;  Warfarin 10mg  7/24: INR 2.44 - warfarin 10mg  7/25:  INR 3.22 - warfarin 10mg  7/26:  INR 3.08 - warfarin 8mg  7/27:  INR 3.24 - warfarin 8 mg 7/28:  INR 2.79 - 8 mg 7/29:  INR 2.74- 8 mg 7/30:  INR 3.19 - 7 mg 7/31:   INR 2.73  7.5mg  8/1:    INR 3.13  7.5mg  8/2:    INR 2.74  7.5mg  8/3     INR 2.27  7.5mg  8/4     INR 3.19  8 mg 8/5     INR 3.04  7.5 mg 8/6     INR 3.05  Goal of Therapy:  INR 2-3 Monitor platelets by anticoagulation protocol: Yes   Plan:  Continue Warfarin 7.5 mg daily. Follow up INR in the AM  (Patient with orders for carbamazepine which interacts with warfarin to decrease the INR.  Will follow INR closely.)   Pharmacy will continue to follow.   Olivia Canter, [facility] Clinical Pharmacist 01/07/2016,7:50 AM

## 2016-01-07 NOTE — Progress Notes (Signed)
In dayroom. Sitter Present.

## 2016-01-07 NOTE — Progress Notes (Signed)
In bed resting with eyes closed. Sitter present.

## 2016-01-07 NOTE — Progress Notes (Signed)
Patient eating lunch.  Patient voices no complaints or concerns at this time.  Patient remains 1:1 with sitter.

## 2016-01-07 NOTE — Progress Notes (Signed)
Pt hyper verbal, restless. Attended evening group. Cooperative with staff and appropriate with peers. Denies any SI/HI/AVH. 1:1 sitter assigned to Pt for safety. Medication compliant. Voices no additional concerns at this time. Will continue to monitor.

## 2016-01-07 NOTE — [facility] Group Notes (Signed)
[place] Group Notes:  (Nursing/MHT/Case Management/Adjunct)  Date:  01/07/2016  Time:  9:36 AM  Type of Therapy:  goal setting  Participation Level:  Active  Participation Quality:  Attentive  Affect:  Appropriate  Cognitive:  Appropriate  Insight:  Improving  Engagement in Group:  Engaged  Modes of Intervention:  goal setting  Summary of Progress/Problems:  Cameron [name] 01/07/2016, 9:36 AM

## 2016-01-07 NOTE — Progress Notes (Signed)
Patient attending group.  Patient continues to be 1:1 with sitter.  Patient voices no concerns or complaints at this time.

## 2016-01-07 NOTE — Progress Notes (Signed)
In Bed resting with eyes closed. Pt had shower prior to going to bed. Sitter present.

## 2016-01-07 NOTE — Plan of Care (Signed)
Problem: Activity: Goal: Will verbalize the importance of balancing activity with adequate rest periods Outcome: Not Progressing Patient continues to be hyper verbal.  Patient sits for brief periods but remains hyper verbal during this time.  Patient ambulates frequently and requires a sitter for safety.

## 2016-01-07 NOTE — Progress Notes (Signed)
Patient ambulating in hallway.  Patient remains 1:1 with sitter.  Patient remains hyper verbal.  Patient voices no complaints or concerns at this time.

## 2016-01-07 NOTE — Progress Notes (Signed)
D: Patient is alert and oriented  Times 1 on the unit this shift. Patient attended  in groups today. Patient denies suicidal ideation, homicidal ideation, auditory or visual hallucinations at the present time.  A: Scheduled medications are administered to patient as per MD orders. Emotional support and encouragement are provided. Patient is maintained on q.15 minute safety checks. Patient is informed to notify staff with questions or concerns. R: No adverse medication reactions are noted. Patient is cooperative with medication administration and treatment plan today. Patient is  Non receptive, calm and cooperative on the unit at this time. Patient interacts  with others on the unit this shift. Patient contracts for safety at this time. Patient remains safe at this time.

## 2016-01-07 NOTE — Plan of Care (Signed)
Problem: Activity: Goal: Sleeping patterns will improve Outcome: Not Progressing Pt continues to wake at various times throughout the night and paces around the unit.

## 2016-01-08 ENCOUNTER — Inpatient Hospital Stay: Payer: 59 | Admitting: Certified Registered"

## 2016-01-08 LAB — GLUCOSE, CAPILLARY
GLUCOSE-CAPILLARY: 101 mg/dL — AB (ref 65–99)
GLUCOSE-CAPILLARY: 176 mg/dL — AB (ref 65–99)
Glucose-Capillary: 111 mg/dL — ABNORMAL HIGH (ref 65–99)

## 2016-01-08 LAB — PROTIME-INR
INR: 3.22
PROTHROMBIN TIME: 33.6 s — AB (ref 11.4–15.2)

## 2016-01-08 MED ORDER — SUCCINYLCHOLINE CHLORIDE 20 MG/ML IJ SOLN
INTRAMUSCULAR | Status: DC | PRN
  Administered 2016-01-08: 150 mg via INTRAVENOUS

## 2016-01-08 MED ORDER — WARFARIN SODIUM 4 MG PO TABS
6.0000 mg | ORAL_TABLET | Freq: Every day | ORAL | Status: DC
  Administered 2016-01-08: 6 mg via ORAL
  Filled 2016-01-08: qty 2

## 2016-01-08 MED ORDER — KETOROLAC TROMETHAMINE 30 MG/ML IJ SOLN
30.0000 mg | Freq: Once | INTRAMUSCULAR | Status: AC
  Administered 2016-01-08: 30 mg via INTRAVENOUS

## 2016-01-08 MED ORDER — SODIUM CHLORIDE 0.9 % IV SOLN
250.0000 mL | Freq: Once | INTRAVENOUS | Status: AC
  Administered 2016-01-08: 500 mL via INTRAVENOUS

## 2016-01-08 MED ORDER — METHOHEXITAL SODIUM 100 MG/10ML IV SOSY
PREFILLED_SYRINGE | INTRAVENOUS | Status: DC | PRN
  Administered 2016-01-08: 100 mg via INTRAVENOUS

## 2016-01-08 MED ORDER — MIDAZOLAM HCL 2 MG/2ML IJ SOLN
INTRAMUSCULAR | Status: DC | PRN
  Administered 2016-01-08: 2 mg via INTRAVENOUS

## 2016-01-08 MED ORDER — KETOROLAC TROMETHAMINE 30 MG/ML IJ SOLN
INTRAMUSCULAR | Status: AC
  Administered 2016-01-08: 30 mg via INTRAVENOUS
  Filled 2016-01-08: qty 1

## 2016-01-08 NOTE — Progress Notes (Signed)
[facility] MD Progress Note  01/08/2016 7:32 PM Cameron [name]  MRN:  [id number] Subjective:    Patient continues to be very hyperverbal very loose in his thinking. He is not physically aggressive or hostile. He can superficially seem as though he is having a lucid conversation but he is so tangential that it is impossible to carry on any kind of back and forth conversation. He did have ECT today and tolerated it well. Legs are looking much better.  Principal Problem: Bipolar I disorder, current or most recent episode manic, with psychotic features ([place]) Diagnosis:   Patient Active Problem List   Diagnosis Date Noted  . Bipolar affective disorder, current episode manic ([place]) [F31.9] 01/04/2016  . Cellulitis of leg, right [L03.115] 01/01/2016  . Bipolar I disorder, current or most recent episode manic, with psychotic features ([place]) [F31.2] 12/11/2015  . Pulmonary embolism ([place]) [I26.99] 12/04/2015  . Diabetes mellitus without complication ([place]) [id number]   . Hypercholesteremia [E78.00]   . CKD (chronic kidney disease), stage III [N18.3]   . Acute deep vein thrombosis (DVT) of femoral vein of right lower extremity (HCC) [I82.411]    Total Time spent with patient: 20 minutes  Past Psychiatric History: Long history of bipolar disorder multiple manic episodes. History of response to ECT.  Past Medical History:  Past Medical History:  Diagnosis Date  . Bipolar 1 disorder ([place])   . CKD (chronic kidney disease), stage III   . Diabetes mellitus without complication ([place])   . DVT (deep venous thrombosis) ([place])   . Hypercholesteremia   . Hypertension     Past Surgical History:  Procedure Laterality Date  . APPENDECTOMY     Family History:  Family History  Problem Relation Age of Onset  . Stroke Other   . Diabetes Other    Family Psychiatric  History: Positive for bipolar disorder Social History:  History  Alcohol Use  . Yes    Comment: rarely     History  Drug Use No    Social  History   Social History  . Marital status: Married    Spouse name: N/A  . Number of children: N/A  . Years of education: N/A   Social History Main Topics  . Smoking status: Never Smoker  . Smokeless tobacco: Never Used  . Alcohol use Yes     Comment: rarely  . Drug use: No  . Sexual activity: Not Asked   Other Topics Concern  . None   Social History Narrative  . None   Additional Social History:                         Sleep: Fair  Appetite:  Fair  Current Medications: Current Facility-Administered Medications  Medication Dose Route Frequency Provider Last Rate Last Dose  . acetaminophen (TYLENOL) tablet 650 mg  650 mg Oral Q6H PRN Gonzella Lex, MD   650 mg at 01/07/16 1958  . alum & mag hydroxide-simeth (MAALOX/MYLANTA) 200-200-20 MG/5ML suspension 30 mL  30 mL Oral Q4H PRN Gonzella Lex, MD      . amoxicillin-clavulanate (AUGMENTIN) 875-125 MG per tablet 1 tablet  1 tablet Oral Q12H Gonzella Lex, MD   1 tablet at 01/08/16 1259  . atorvastatin (LIPITOR) tablet 40 mg  40 mg Oral q1800 Gonzella Lex, MD   40 mg at 01/08/16 1736  . carbamazepine (TEGRETOL) tablet 1,200 mg  1,200 mg Oral BID PC Gonzella Lex, MD  1,200 mg at 01/08/16 1735  . dextrose 5 % solution 250 mL  250 mL Intravenous Once Gonzella Lex, MD      . enalapril (VASOTEC) tablet 5 mg  5 mg Oral BID Gonzella Lex, MD   5 mg at 01/08/16 0726  . fenofibrate tablet 160 mg  160 mg Oral Daily Gonzella Lex, MD   160 mg at 01/08/16 1259  . haloperidol lactate (HALDOL) injection 20 mg  20 mg Intramuscular Q6H PRN Gonzella Lex, MD      . insulin glargine (LANTUS) injection 60 Units  60 Units Subcutaneous QHS Gonzella Lex, MD   60 Units at 01/06/16 2128  . ketorolac (TORADOL) 30 MG/ML injection 30 mg  30 mg Intravenous Once Gonzella Lex, MD      . lamoTRIgine (LAMICTAL) tablet 200 mg  200 mg Oral Daily Gonzella Lex, MD   200 mg at 01/08/16 1259  . LORazepam (ATIVAN) injection 2 mg  2 mg  Intramuscular Q6H PRN Gonzella Lex, MD      . magnesium hydroxide (MILK OF MAGNESIA) suspension 30 mL  30 mL Oral Daily PRN Gonzella Lex, MD      . QUEtiapine (SEROQUEL) tablet 1,200 mg  1,200 mg Oral QHS Gonzella Lex, MD   1,200 mg at 01/07/16 2100  . temazepam (RESTORIL) capsule 15 mg  15 mg Oral QHS Gonzella Lex, MD   15 mg at 01/07/16 2059  . warfarin (COUMADIN) tablet 6 mg  6 mg Oral q1800 Gonzella Lex, MD   6 mg at 01/08/16 1736  . Warfarin - Pharmacist Dosing Inpatient   Does not apply q1800 Gonzella Lex, MD        Lab Results:  Results for orders placed or performed during the hospital encounter of 01/04/16 (from the past 48 hour(s))  Glucose, capillary     Status: Abnormal   Collection Time: 01/06/16  7:42 PM  Result Value Ref Range   Glucose-Capillary 137 (H) 65 - 99 mg/dL  Glucose, capillary     Status: Abnormal   Collection Time: 01/07/16  2:48 AM  Result Value Ref Range   Glucose-Capillary 107 (H) 65 - 99 mg/dL  Protime-INR     Status: Abnormal   Collection Time: 01/07/16  6:18 AM  Result Value Ref Range   Prothrombin Time 32.2 (H) 11.4 - 15.2 seconds   INR 3.05   CBC     Status: Abnormal   Collection Time: 01/07/16  6:18 AM  Result Value Ref Range   WBC 2.7 (L) 3.8 - 10.6 K/uL   RBC 4.04 (L) 4.40 - 5.90 MIL/uL   Hemoglobin 11.9 (L) 13.0 - 18.0 g/dL   HCT 34.3 (L) 40.0 - 52.0 %   MCV 84.9 80.0 - 100.0 fL   MCH 29.4 26.0 - 34.0 pg   MCHC 34.7 32.0 - 36.0 g/dL   RDW 13.2 11.5 - 14.5 %   Platelets 281 150 - 440 K/uL  Glucose, capillary     Status: None   Collection Time: 01/07/16  6:26 AM  Result Value Ref Range   Glucose-Capillary 88 65 - 99 mg/dL  Glucose, capillary     Status: None   Collection Time: 01/07/16 12:00 PM  Result Value Ref Range   Glucose-Capillary 97 65 - 99 mg/dL   Comment 1 Notify RN   Glucose, capillary     Status: None   Collection Time: 01/07/16  4:45 PM  Result Value Ref Range   Glucose-Capillary 82 65 - 99 mg/dL   Comment 1  Notify RN   Glucose, capillary     Status: Abnormal   Collection Time: 01/07/16  7:58 PM  Result Value Ref Range   Glucose-Capillary 125 (H) 65 - 99 mg/dL   Comment 1 Notify RN   Glucose, capillary     Status: Abnormal   Collection Time: 01/08/16  6:18 AM  Result Value Ref Range   Glucose-Capillary 111 (H) 65 - 99 mg/dL   Comment 1 Notify RN   Protime-INR     Status: Abnormal   Collection Time: 01/08/16  6:46 AM  Result Value Ref Range   Prothrombin Time 33.6 (H) 11.4 - 15.2 seconds   INR 3.22   Glucose, capillary     Status: Abnormal   Collection Time: 01/08/16  4:44 PM  Result Value Ref Range   Glucose-Capillary 101 (H) 65 - 99 mg/dL   Comment 1 Notify RN     Blood Alcohol level:  Lab Results  Component Value Date   ETH <5 [id number]    Metabolic Disorder Labs: Lab Results  Component Value Date   HGBA1C 7.4 (H) 01/02/2016   MPG 192 12/04/2015   MPG 318 (H) 08/24/2010   Lab Results  Component Value Date   PROLACTIN 4.4 12/11/2015   Lab Results  Component Value Date   CHOL 143 12/11/2015   TRIG 286 (H) 12/11/2015   HDL 32 (L) 12/11/2015   CHOLHDL 4.5 12/11/2015   VLDL 57 (H) 12/11/2015   LDLCALC 54 12/11/2015   LDLCALC 56 12/05/2015    Physical Findings: AIMS: Facial and Oral Movements Muscles of Facial Expression: None, normal Lips and Perioral Area: None, normal Jaw: None, normal Tongue: None, normal,Extremity Movements Upper (arms, wrists, hands, fingers): None, normal Lower (legs, knees, ankles, toes): None, normal, Trunk Movements Neck, shoulders, hips: None, normal, Overall Severity Severity of abnormal movements (highest score from questions above): None, normal Incapacitation due to abnormal movements: None, normal Patient's awareness of abnormal movements (rate only patient's report): No Awareness, Dental Status Current problems with teeth and/or dentures?: No Does patient usually wear dentures?: No  CIWA:    COWS:      Musculoskeletal: Strength & Muscle Tone: within normal limits Gait & Station: normal Patient leans: N/A  Psychiatric Specialty Exam: Physical Exam  Nursing note and vitals reviewed. Constitutional: He appears well-developed and well-nourished.  HENT:  Head: Normocephalic and atraumatic.  Eyes: Conjunctivae are normal. Pupils are equal, round, and reactive to light.  Neck: Normal range of motion.  Cardiovascular: Normal heart sounds.   Respiratory: Effort normal. No respiratory distress.  GI: Soft.  Musculoskeletal: Normal range of motion.  Neurological: He is alert.  Skin: Skin is warm and dry.     Psychiatric: His affect is labile and inappropriate. His speech is tangential. He is hyperactive. Thought content is delusional. He expresses impulsivity. He expresses no homicidal and no suicidal ideation. He exhibits abnormal recent memory. He is inattentive.    Review of Systems  Constitutional: Negative.  Negative for chills, fever, malaise/fatigue and weight loss.  HENT: Negative.  Negative for hearing loss, nosebleeds, sore throat and tinnitus.   Eyes: Negative.  Negative for blurred vision and double vision.  Respiratory: Negative.  Negative for cough, hemoptysis, sputum production, shortness of breath and wheezing.   Cardiovascular: Negative.  Negative for chest pain, palpitations and claudication.  Gastrointestinal: Negative.  Negative for abdominal pain, blood in stool, constipation, diarrhea,  heartburn, nausea and vomiting.  Genitourinary: Negative for dysuria, frequency and urgency.  Musculoskeletal: Negative.  Negative for back pain, falls, joint pain, myalgias and neck pain.  Skin: Negative.  Negative for itching and rash.  Neurological: Negative.  Negative for dizziness, tingling, tremors, focal weakness, seizures and headaches.  Endo/Heme/Allergies: Negative for environmental allergies. Does not bruise/bleed easily.  Psychiatric/Behavioral: Negative for depression,  hallucinations, memory loss, substance abuse and suicidal ideas. The patient is not nervous/anxious and does not have insomnia.     Blood pressure 131/86, pulse 83, temperature 98.5 F (36.9 C), resp. rate 20, height 6' (1.829 m), weight 133.4 kg (294 lb), SpO2 98 %.Body mass index is 39.87 kg/m.  General Appearance: Disheveled  Eye Contact:  Fair  Speech:  Pressured  Volume:  Increased  Mood:  Euphoric  Affect:  Labile  Thought Process:  Disorganized  Orientation:  Full (Time, Place, and Person)  Thought Content:  Illogical, Delusions, Rumination and Tangential  Suicidal Thoughts:  No  Homicidal Thoughts:  No  Memory:  Immediate;   Fair Recent;   Poor Remote;   Fair  Judgement:  Impaired  Insight:  Shallow  Psychomotor Activity:  Increased  Concentration:  Concentration: Poor  Recall:  Continental of Knowledge:  Good  Language:  Good  Akathisia:  No  Handed:  Right  AIMS (if indicated):     Assets:  Financial Resources/Insurance Housing Social Support  ADL's:  Impaired  Cognition:  Impaired,  Mild  Sleep:  Number of Hours: 7.3     Treatment Plan Summary:  Mr. Jon 50 year old married Caucasian male with a history of bipolar disorder who was admitted to the hospital for ECT treatment for mania. He was just transferred down to the medicine floor after treatment for cellulitis of the leg.  Bipolar disorder: Most recent episode manic: The patient is currently receiving ECT treatment which has been helpful for him in the past. He is on a number of different psychotropic medications at very high dosages but mania persists. He will continue on Tegretol 1200 mg by mouth twice a day, Lamictal 200 mg by mouth daily and Seroquel 1200 mg by mouth nightly. Patient also has Restoril 15 mg by mouth nightly for insomnia. Will have to monitor Tegretol level and liver enzymes. We'll have to monitor hemoglobin A1c, lipid panel and prolactin level as well. He is currently receiving ECT 3  times a week and so far denies any side effects from the ECT. He has when necessary Haldol IM and Ativan IM at pretty high doses as that appears to be effective for him. He remains disorganized and manic.  Hyperlipidemia: We'll plan to continue Lipitor 40 mg by mouth daily and fenofibrate 160 mg by mouth daily  Cellulitis of the leg: Will continue Augmentin 1 tablet by mouth twice a day. Cellulitis is slowly improving.  History of pulmonary embolism: We'll continue Coumadin 8 mg by mouth nightly.  Disposition: The patient will be discharged home with his wife because he does have a stable living situation. Psychotropic medication management follow-up appointment as well as individual therapy will be scheduled with Dr. Thurmond Butts.   This was his seventh ECT treatment with only partial improvement. I did increase his Tegretol dose last week. I am probably going to recheck the level in the next day or so. I have not made any further changes in medicine since increasing his Seroquel. Patient seems to tolerate his medicine well and continues to be very disorganized and unable  to function in a way that would allow for outpatient treatment. Alethia Berthold, MD 01/08/2016, 7:32 PM

## 2016-01-08 NOTE — Procedures (Signed)
ECT SERVICES Physician's Interval Evaluation & Treatment Note  Patient Identification: Cameron [name] MRN:  [id number] Date of Evaluation:  01/08/2016 TX #: 7  MADRS: 20  MMSE: 27  P.E. Findings:  Leg erythema and sores are much better. Vital signs stable heart and lungs normal.  Psychiatric Interval Note:  Continues to be manic hyperverbal tangential  Subjective:  Patient is a 50 y.o. male seen for evaluation for Electroconvulsive Therapy. No specific complaint due to thought disorder  Treatment Summary:   []   Right Unilateral             [x]  Bilateral   % Energy : 1.0 ms 100%   Impedance: 1350 ohms  Seizure Energy Index: No reading was obtained  Postictal Suppression Index: No reading was obtained  Seizure Concordance Index: No reading was obtained  Medications  Pre Shock: Toradol 30 mg, Brevital 100 mg, succinylcholine 150 mg  Post Shock:  5 seconds by EMG. I would estimate 105 seconds by EEG. Difficult to read. Computer not able to read at all but there was clearly an extended seizure.  Seizure Duration: See note above. No postop meds.   Comments: Patient continues to have seizures. This is treatment #7. Follow-up Wednesday   Lungs:  [x]   Clear to auscultation               []  Other:   Heart:    [x]   Regular rhythm             []  irregular rhythm    [x]   Previous H&P reviewed, patient examined and there are NO CHANGES                 []   Previous H&P reviewed, patient examined and there are changes noted.   Alethia Berthold, MD 8/7/201711:45 AM

## 2016-01-08 NOTE — Progress Notes (Signed)
Patient remains 1:1 with sitter.  Patient remains hyper verbal.  Patient voices no concerns or complaints at this time.

## 2016-01-08 NOTE — Progress Notes (Signed)
Recreation Therapy Notes  Date: 08.07.17 Time: 1:00 pm Location: Craft Room  Group Topic: Self-expression  Goal Area(s) Addresses:  Patient will be able to identify a color that represents each emotion. Patient will verbalize benefit of using art as a means of self-expression. Patient will verbalize one emotion experienced while participating in activity.  Behavioral Response: Intermittently Attentive, Interactive, Off Topic  Intervention: The Colors Within Me  Activity: Patients were given blank face worksheets and instructed to pick a color for each emotion they were experiencing and show on the face how much of that emotion they were experiencing.  Education: LRT educated patients on other forms of self-expression.  Education Outcome: In group clarification offered  Clinical Observations/Feedback: Patient drew face on worksheet and wrote, "I am human" on his worksheet. Patient contributed to group discussion but would get off topic easily. LRT attempted to redirect patient multiple times.  [name], LRT/CTRS 01/08/2016 3:23 PM

## 2016-01-08 NOTE — Anesthesia Postprocedure Evaluation (Signed)
Anesthesia Post Note  Patient: EIZAN DEBARDELABEN  Procedure(s) Performed: * No procedures listed *  Patient location during evaluation: PACU Anesthesia Type: General Level of consciousness: awake and alert Pain management: pain level controlled Vital Signs Assessment: post-procedure vital signs reviewed and stable Respiratory status: spontaneous breathing, nonlabored ventilation, respiratory function stable and patient connected to nasal cannula oxygen Cardiovascular status: blood pressure returned to baseline and stable Postop Assessment: no signs of nausea or vomiting Anesthetic complications: no    Last Vitals:  Vitals:   01/08/16 1213 01/08/16 1221  BP: 135/78 (!) 145/92  Pulse: (!) 101 96  Resp: 20 20  Temp:  37 C    Last Pain:  Vitals:   01/08/16 1221  TempSrc:   PainSc: 0-No pain                 Precious Haws Tinsley Everman

## 2016-01-08 NOTE — Progress Notes (Signed)
ANTICOAGULATION CONSULT NOTE - Follow Up Consult  Pharmacy Consult for Warfarin Indication: VTE treatment  Allergies  Allergen Reactions  . Asa [Aspirin] Other (See Comments)    Patient tolerates LOW DOSE ASPIRIN.    Patient Measurements: Height: 6' (182.9 cm) Weight: 280 lb (127 kg) IBW/kg (Calculated) : 77.6  Vital Signs: Temp: 97.9 F (36.6 C) (08/07 0700) Temp Source: Oral (08/07 0700) BP: 121/73 (08/07 0700) Pulse Rate: 94 (08/07 0700)  Labs:  Recent Labs  01/06/16 0712 01/07/16 0618 01/08/16 0646  HGB 11.3* 11.9*  --   HCT 33.2* 34.3*  --   PLT 286 281  --   LABPROT 32.1* 32.2* 33.6*  INR 3.04 3.05 3.22   Estimated Creatinine Clearance: 75.2 mL/min (by C-G formula based on SCr of 1.6 mg/dL).  Medical History: Past Medical History:  Diagnosis Date  . Bipolar 1 disorder ([place])   . CKD (chronic kidney disease), stage III   . Diabetes mellitus without complication ([place])   . DVT (deep venous thrombosis) ([place])   . Hypercholesteremia   . Hypertension    Medications:  Warfarin 10 mg po daily  Assessment: 59 yom admitted to ED [facility] with manic symptoms. Recently discharged from [facility] with DVT/PE, had been bridging LMWH and VKA prior to admission.  INR History: 7/9: INR - 1.99 7/10: no INR- warfarin 12.5 mg po daily at 0200 7/11: INR - 2.97- warfarin held 7/12: INR - 1.92 - warfarin 10 mg 7/13: INR - 1.95 - warfarin 10 mg 7/14: INR - 2.56 - warfarin 5 mg 7/15: INR - 2.73 - warfarin 5 mg 7/16: INR - 2.39 - warfarin 7.5 mg 7/17: INR - 2.22 - warfarin 7.5 mg 7/18: INR - 2.53 - warfarin 7.5 mg 7/19: INR - 2.75 - warfarin 7.5 mg 7/20: INR - 2.99 - warfarin 6.5 mg 7/21: INR - 2.64 - warfarin 6.5 mg  7/22: INR: 2.44; warfarin 8  7/23: INR: 2.60;  Warfarin 10mg  7/24: INR 2.44 - warfarin 10mg  7/25:  INR 3.22 - warfarin 10mg  7/26:  INR 3.08 - warfarin 8mg  7/27:  INR 3.24 - warfarin 8 mg 7/28:  INR 2.79 - 8 mg 7/29:  INR 2.74- 8 mg 7/30:  INR 3.19 - 7 mg 7/31:   INR 2.73  7.5mg  8/1:    INR 3.13  7.5mg  8/2:    INR 2.74  7.5mg  8/3     INR 2.27  7.5mg  8/4     INR 3.19  8 mg 8/5     INR 3.04  7.5 mg 8/6     INR 3.05  7.5mg  8/7:    INR  3.22    Goal of Therapy:  INR 2-3 Monitor platelets by anticoagulation protocol: Yes   Plan:  INR is supra-therapeutic today. Will order Warfarin 6 mg PO daily. Follow up INR in the AM. Last CBC 8/6.   (Patient with orders for carbamazepine which interacts with warfarin to decrease the INR.  Will follow INR closely.)   Pt will need CBC q3 days per policy.   Pharmacy will continue to follow.   Loree Fee, [facility] Clinical Pharmacist 01/08/2016,7:55 AM

## 2016-01-08 NOTE — H&P (Signed)
Cameron [name] is an 50 y.o. male.   Chief Complaint: Patient continues to be manic hyperverbal no specific complaint. HPI: Bipolar disorder mania receiving both ECT and medication. Perhaps partial improvement but still very disorganized and hyperverbal and tangential  Past Medical History:  Diagnosis Date  . Bipolar 1 disorder ([place])   . CKD (chronic kidney disease), stage III   . Diabetes mellitus without complication ([place])   . DVT (deep venous thrombosis) ([place])   . Hypercholesteremia   . Hypertension     Past Surgical History:  Procedure Laterality Date  . APPENDECTOMY      Family History  Problem Relation Age of Onset  . Stroke Other   . Diabetes Other    Social History:  reports that he has never smoked. He has never used smokeless tobacco. He reports that he drinks alcohol. He reports that he does not use drugs.  Allergies:  Allergies  Allergen Reactions  . Asa [Aspirin] Other (See Comments)    Patient tolerates LOW DOSE ASPIRIN.    Medications Prior to Admission  Medication Sig Dispense Refill  . enalapril (VASOTEC) 5 MG tablet Take 5 mg by mouth 2 (two) times daily.    Marland Kitchen amoxicillin-clavulanate (AUGMENTIN) 875-125 MG tablet Take 1 tablet by mouth 2 (two) times daily. X 7 more days 14 tablet 0  . ARIPiprazole (ABILIFY) 30 MG tablet Take 30 mg by mouth every evening.     Marland Kitchen atorvastatin (LIPITOR) 40 MG tablet Take 40 mg by mouth daily.  11  . carbamazepine (TEGRETOL) 200 MG tablet Take 2 tablets (400 mg total) by mouth every morning. 30 tablet 0  . carbamazepine (TEGRETOL) 200 MG tablet Take 3 tablets (600 mg total) by mouth every evening. 30 tablet 0  . fenofibrate 160 MG tablet Take 160 mg by mouth daily.    . insulin aspart (NOVOLOG) 100 UNIT/ML injection Inject 0-9 Units into the skin 3 (three) times daily with meals. 10 mL 11  . insulin aspart (NOVOLOG) 100 UNIT/ML injection Inject 0-5 Units into the skin at bedtime. 10 mL 11  . Insulin Glargine (LANTUS  SOLOSTAR) 100 UNIT/ML Solostar Pen Inject 60 Units into the skin daily.     Marland Kitchen lamoTRIgine (LAMICTAL) 100 MG tablet Take 400 mg by mouth at bedtime.    . Multiple Vitamin (MULTIVITAMIN WITH MINERALS) TABS tablet Take 1 tablet by mouth daily.    . QUEtiapine (SEROQUEL) 400 MG tablet Take 3 tablets (1,200 mg total) by mouth at bedtime. 90 tablet 0  . TANZEUM 50 MG PEN Inject 50 mg into the skin every 7 (seven) days.  2  . temazepam (RESTORIL) 15 MG capsule Take 15 mg by mouth at bedtime as needed for sleep.    Marland Kitchen warfarin (COUMADIN) 7.5 MG tablet Take 1 tablet (7.5 mg total) by mouth daily at 6 PM. 30 tablet 0    Results for orders placed or performed during the hospital encounter of 01/04/16 (from the past 48 hour(s))  Glucose, capillary     Status: Abnormal   Collection Time: 01/06/16  5:08 PM  Result Value Ref Range   Glucose-Capillary 64 (L) 65 - 99 mg/dL  Glucose, capillary     Status: Abnormal   Collection Time: 01/06/16  7:42 PM  Result Value Ref Range   Glucose-Capillary 137 (H) 65 - 99 mg/dL  Glucose, capillary     Status: Abnormal   Collection Time: 01/07/16  2:48 AM  Result Value Ref Range   Glucose-Capillary 107 (  H) 65 - 99 mg/dL  Protime-INR     Status: Abnormal   Collection Time: 01/07/16  6:18 AM  Result Value Ref Range   Prothrombin Time 32.2 (H) 11.4 - 15.2 seconds   INR 3.05   CBC     Status: Abnormal   Collection Time: 01/07/16  6:18 AM  Result Value Ref Range   WBC 2.7 (L) 3.8 - 10.6 K/uL   RBC 4.04 (L) 4.40 - 5.90 MIL/uL   Hemoglobin 11.9 (L) 13.0 - 18.0 g/dL   HCT 34.3 (L) 40.0 - 52.0 %   MCV 84.9 80.0 - 100.0 fL   MCH 29.4 26.0 - 34.0 pg   MCHC 34.7 32.0 - 36.0 g/dL   RDW 13.2 11.5 - 14.5 %   Platelets 281 150 - 440 K/uL  Glucose, capillary     Status: None   Collection Time: 01/07/16  6:26 AM  Result Value Ref Range   Glucose-Capillary 88 65 - 99 mg/dL  Glucose, capillary     Status: None   Collection Time: 01/07/16 12:00 PM  Result Value Ref Range    Glucose-Capillary 97 65 - 99 mg/dL   Comment 1 Notify RN   Glucose, capillary     Status: None   Collection Time: 01/07/16  4:45 PM  Result Value Ref Range   Glucose-Capillary 82 65 - 99 mg/dL   Comment 1 Notify RN   Glucose, capillary     Status: Abnormal   Collection Time: 01/07/16  7:58 PM  Result Value Ref Range   Glucose-Capillary 125 (H) 65 - 99 mg/dL   Comment 1 Notify RN   Glucose, capillary     Status: Abnormal   Collection Time: 01/08/16  6:18 AM  Result Value Ref Range   Glucose-Capillary 111 (H) 65 - 99 mg/dL   Comment 1 Notify RN   Protime-INR     Status: Abnormal   Collection Time: 01/08/16  6:46 AM  Result Value Ref Range   Prothrombin Time 33.6 (H) 11.4 - 15.2 seconds   INR 3.22    No results found.  Review of Systems  Constitutional: Negative.   HENT: Negative.   Eyes: Negative.   Respiratory: Negative.   Cardiovascular: Negative.   Gastrointestinal: Negative.   Musculoskeletal: Negative.   Skin: Negative.   Neurological: Negative.   Psychiatric/Behavioral: Negative for depression, hallucinations, memory loss, substance abuse and suicidal ideas. The patient has insomnia. The patient is not nervous/anxious.     Blood pressure 133/80, pulse 92, temperature 98.5 F (36.9 C), temperature source Oral, resp. rate 18, height 6' (1.829 m), weight 133.4 kg (294 lb), SpO2 98 %. Physical Exam  Nursing note and vitals reviewed. Constitutional: He appears well-developed and well-nourished.  HENT:  Head: Normocephalic and atraumatic.  Eyes: Conjunctivae are normal. Pupils are equal, round, and reactive to light.  Neck: Normal range of motion.  Cardiovascular: Regular rhythm and normal heart sounds.   Respiratory: Effort normal. No respiratory distress.  GI: Soft.  Musculoskeletal: Normal range of motion.  Neurological: He is alert.  Skin: Skin is warm and dry.     Psychiatric: His affect is labile and inappropriate. His speech is tangential. He is  hyperactive. Thought content is delusional. He expresses impulsivity. He exhibits abnormal recent memory.     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 Continue ECT and medicine management. We have tentatively agreed to 8 treatments I am hoping that we may be able to convince him to continue  on after Wednesday.  Alethia Berthold, MD 01/08/2016, 11:43 AM

## 2016-01-08 NOTE — Progress Notes (Signed)
Patient ambulating hallway.  Patient remains 1:1 with sitter.  Patient voices no complaints or concerns at this time.  Will continue to monitor.

## 2016-01-08 NOTE — Plan of Care (Signed)
Problem: Safety: Goal: Periods of time without injury will increase Outcome: Progressing Patient has remained free from injury this shift.  Patient remains 1:1 with sitter for safety.

## 2016-01-08 NOTE — Progress Notes (Signed)
Patient off unit

## 2016-01-08 NOTE — Progress Notes (Signed)
Patient ambulating in hallway.  Patient remains 1:1 with sitter.  Patient voices no concerns or complaints at this time.

## 2016-01-08 NOTE — Progress Notes (Signed)
Patient ambulating in hallway. Patient remains 1:1 with sitter. Patient continues to be hyper verbal.  Patient voices no complaints or concerns at this time.

## 2016-01-08 NOTE — Progress Notes (Signed)
Patient outside with peers.  Patient remains 1:1 with sitter. Patient voices no complaints or concerns at this time.  Will continue to monitor.

## 2016-01-08 NOTE — Anesthesia Preprocedure Evaluation (Addendum)
Anesthesia Evaluation  Patient identified by MRN, date of birth, ID band Patient awake    Reviewed: Allergy & Precautions, NPO status , Patient's Chart, lab work & pertinent test results  History of Anesthesia Complications Negative for: history of anesthetic complications  Airway Mallampati: II  TM Distance: >3 FB Neck ROM: Full    Dental no notable dental hx.    Pulmonary neg COPD,    breath sounds clear to auscultation- rhonchi (-) wheezing      Cardiovascular hypertension, Pt. on medications (-) angina+ Peripheral Vascular Disease  (-) CAD and (-) Past MI  Rhythm:Regular Rate:Normal - Systolic murmurs and - Diastolic murmurs    Neuro/Psych PSYCHIATRIC DISORDERS negative neurological ROS     GI/Hepatic negative GI ROS, Neg liver ROS,   Endo/Other  diabetes, Well Controlled, Insulin Dependent  Renal/GU CRFRenal disease (baseline Cr 1.6)     Musculoskeletal negative musculoskeletal ROS (+)   Abdominal (+) + obese,   Peds  Hematology negative hematology ROS (+)   Anesthesia Other Findings Past Medical History: No date: Bipolar 1 disorder ([facility]) No date: CKD (chronic kidney disease), stage III No date: Diabetes mellitus without complication ([facility]) No date: DVT (deep venous thrombosis) ([facility]) No date: Hypercholesteremia No date: Hypertension   Reproductive/Obstetrics                             Anesthesia Physical  Anesthesia Plan  ASA: III  Anesthesia Plan: General   Post-op Pain Management:    Induction: Intravenous  Airway Management Planned: Mask  Additional Equipment:   Intra-op Plan: Delibrate Circulatory arrest per surgeon request  Post-operative Plan:   Informed Consent: I have reviewed the patients History and Physical, chart, labs and discussed the procedure including the risks, benefits and alternatives for the proposed anesthesia with the patient or authorized  representative who has indicated his/her understanding and acceptance.     Plan Discussed with: Anesthesiologist and CRNA  Anesthesia Plan Comments:         Anesthesia Quick Evaluation

## 2016-01-08 NOTE — Progress Notes (Signed)
Patient ambulating in the hallway.  Patient remains 1:1 with sitter.  Patient voices no complaints or concerns at this time.  Will continue to monitor.

## 2016-01-08 NOTE — Progress Notes (Signed)
D: Patient remains hyper verbal and patient requires a Actuary for safety.    A:  Patient encouraged to attend group.  Patient encouraged to rest. R:  Patient attended group sessions held.  Patient continues to be hyper verbal and restless.

## 2016-01-08 NOTE — Progress Notes (Signed)
Patient back on unit eating lunch.  Patient voices no complaints or concerns at this time.  Patient remains 1:1 with a sitter.

## 2016-01-08 NOTE — Transfer of Care (Signed)
Immediate Anesthesia Transfer of Care Note  Patient: Cameron [name]  Procedure(s) Performed: * No procedures listed *  Patient Location: PACU  Anesthesia Type:General  Level of Consciousness: sedated  Airway & Oxygen Therapy: Patient Spontanous Breathing and Patient connected to face mask oxygen  Post-op Assessment: Report given to RN and Post -op Vital signs reviewed and stable  Post vital signs: Reviewed and stable  Last Vitals:  Vitals:   01/08/16 0934 01/08/16 1153  BP: 133/80 126/86  Pulse: 92 (!) 108  Resp: 18 (!) 23  Temp: 36.9 C     Last Pain:  Vitals:   01/08/16 0936  TempSrc:   PainSc: 0-No pain      Patients Stated Pain Goal: 0 ([id number])  Complications: No apparent anesthesia complications

## 2016-01-08 NOTE — Progress Notes (Signed)
Patient remains 1:1 with sitter.  Patient in dayroom with peers.  Patient voices no concerns or complaints at this time.

## 2016-01-09 LAB — PROTIME-INR
INR: 3.85
PROTHROMBIN TIME: 38.8 s — AB (ref 11.4–15.2)

## 2016-01-09 LAB — GLUCOSE, CAPILLARY
GLUCOSE-CAPILLARY: 94 mg/dL (ref 65–99)
GLUCOSE-CAPILLARY: 67 mg/dL (ref 65–99)
GLUCOSE-CAPILLARY: 114 mg/dL — AB (ref 65–99)
Glucose-Capillary: 65 mg/dL (ref 65–99)
Glucose-Capillary: 149 mg/dL — ABNORMAL HIGH (ref 65–99)
Glucose-Capillary: 134 mg/dL — ABNORMAL HIGH (ref 65–99)
Glucose-Capillary: 93 mg/dL (ref 65–99)

## 2016-01-09 LAB — CARBAMAZEPINE LEVEL, TOTAL: Carbamazepine Lvl: 8.4 ug/mL (ref 4.0–12.0)

## 2016-01-09 NOTE — Progress Notes (Signed)
Patient blood glucose rechecked after patient had eaten breakfast and results were 149.  Patient still remains 1:1 with sitter.  Patient continues to be hyper verbal.  Patient voices no complaints or concerns at this time. Will continue to monitor.

## 2016-01-09 NOTE — Progress Notes (Signed)
[facility] MD Progress Note  01/09/2016 7:01 PM Cameron [name]  MRN:  [id number] Subjective:    Follow-up for this 50 year old man with bipolar disorder manic. Patient seen today. Treatment team met as well. Chart reviewed. Patient is gradually showing improvement. He has been able to stay quietly groups for some appropriate periods of time. In interview he is still disorganized and very tangential. It takes him several minutes to ask even a simple question and even then it's sometimes hard to tell what he is communicating. He has not been aggressive however his affect appears to be calm her. Insight is improved. I noticed today that he was starting to have episodes of tearfulness which do suggest that the mania is calming down but are worrisome for the possibility of onset of depression. He has not engaged in any dangerous behavior and denies suicidal thoughts.  Principal Problem: Bipolar I disorder, current or most recent episode manic, with psychotic features ([place]) Diagnosis:   Patient Active Problem List   Diagnosis Date Noted  . Bipolar affective disorder, current episode manic ([place]) [F31.9] 01/04/2016  . Cellulitis of leg, right [L03.115] 01/01/2016  . Bipolar I disorder, current or most recent episode manic, with psychotic features ([place]) [F31.2] 12/11/2015  . Pulmonary embolism ([place]) [I26.99] 12/04/2015  . Diabetes mellitus without complication ([place]) [G50.0]   . Hypercholesteremia [E78.00]   . CKD (chronic kidney disease), stage III [N18.3]   . Acute deep vein thrombosis (DVT) of femoral vein of right lower extremity (HCC) [I82.411]    Total Time spent with patient: 20 minutes  Past Psychiatric History: Long history of bipolar disorder multiple manic episodes. History of response to ECT.  Past Medical History:  Past Medical History:  Diagnosis Date  . Bipolar 1 disorder ([place])   . CKD (chronic kidney disease), stage III   . Diabetes mellitus without complication ([place])   . DVT (deep  venous thrombosis) ([place])   . Hypercholesteremia   . Hypertension     Past Surgical History:  Procedure Laterality Date  . APPENDECTOMY     Family History:  Family History  Problem Relation Age of Onset  . Stroke Other   . Diabetes Other    Family Psychiatric  History: Positive for bipolar disorder Social History:  History  Alcohol Use  . Yes    Comment: rarely     History  Drug Use No    Social History   Social History  . Marital status: Married    Spouse name: N/A  . Number of children: N/A  . Years of education: N/A   Social History Main Topics  . Smoking status: Never Smoker  . Smokeless tobacco: Never Used  . Alcohol use Yes     Comment: rarely  . Drug use: No  . Sexual activity: Not Asked   Other Topics Concern  . None   Social History Narrative  . None   Additional Social History:                         Sleep: Fair  Appetite:  Fair  Current Medications: Current Facility-Administered Medications  Medication Dose Route Frequency Provider Last Rate Last Dose  . acetaminophen (TYLENOL) tablet 650 mg  650 mg Oral Q6H PRN Gonzella Lex, MD   650 mg at 01/08/16 2202  . alum & mag hydroxide-simeth (MAALOX/MYLANTA) 200-200-20 MG/5ML suspension 30 mL  30 mL Oral Q4H PRN Gonzella Lex, MD      .  amoxicillin-clavulanate (AUGMENTIN) 875-125 MG per tablet 1 tablet  1 tablet Oral Q12H Gonzella Lex, MD   1 tablet at 01/09/16 0900  . atorvastatin (LIPITOR) tablet 40 mg  40 mg Oral q1800 Gonzella Lex, MD   40 mg at 01/09/16 1744  . carbamazepine (TEGRETOL) tablet 1,200 mg  1,200 mg Oral BID PC Gonzella Lex, MD   1,200 mg at 01/09/16 1744  . dextrose 5 % solution 250 mL  250 mL Intravenous Once Gonzella Lex, MD      . enalapril (VASOTEC) tablet 5 mg  5 mg Oral BID Gonzella Lex, MD   5 mg at 01/08/16 2202  . fenofibrate tablet 160 mg  160 mg Oral Daily Gonzella Lex, MD   160 mg at 01/09/16 0900  . haloperidol lactate (HALDOL) injection 20 mg   20 mg Intramuscular Q6H PRN Gonzella Lex, MD      . insulin glargine (LANTUS) injection 60 Units  60 Units Subcutaneous QHS Gonzella Lex, MD   60 Units at 01/08/16 2213  . ketorolac (TORADOL) 30 MG/ML injection 30 mg  30 mg Intravenous Once Gonzella Lex, MD      . lamoTRIgine (LAMICTAL) tablet 200 mg  200 mg Oral Daily Gonzella Lex, MD   200 mg at 01/09/16 0900  . LORazepam (ATIVAN) injection 2 mg  2 mg Intramuscular Q6H PRN Gonzella Lex, MD      . magnesium hydroxide (MILK OF MAGNESIA) suspension 30 mL  30 mL Oral Daily PRN Gonzella Lex, MD      . QUEtiapine (SEROQUEL) tablet 1,200 mg  1,200 mg Oral QHS Gonzella Lex, MD   1,200 mg at 01/08/16 2203  . temazepam (RESTORIL) capsule 15 mg  15 mg Oral QHS Gonzella Lex, MD   15 mg at 01/08/16 2206  . Warfarin - Pharmacist Dosing Inpatient   Does not apply q1800 Gonzella Lex, MD   Stopped at 01/09/16 1800    Lab Results:  Results for orders placed or performed during the hospital encounter of 01/04/16 (from the past 48 hour(s))  Glucose, capillary     Status: Abnormal   Collection Time: 01/07/16  7:58 PM  Result Value Ref Range   Glucose-Capillary 125 (H) 65 - 99 mg/dL   Comment 1 Notify RN   Glucose, capillary     Status: Abnormal   Collection Time: 01/08/16  6:18 AM  Result Value Ref Range   Glucose-Capillary 111 (H) 65 - 99 mg/dL   Comment 1 Notify RN   Protime-INR     Status: Abnormal   Collection Time: 01/08/16  6:46 AM  Result Value Ref Range   Prothrombin Time 33.6 (H) 11.4 - 15.2 seconds   INR 3.22   Glucose, capillary     Status: Abnormal   Collection Time: 01/08/16  4:44 PM  Result Value Ref Range   Glucose-Capillary 101 (H) 65 - 99 mg/dL   Comment 1 Notify RN   Glucose, capillary     Status: None   Collection Time: 01/08/16  8:27 PM  Result Value Ref Range   Glucose-Capillary 94 65 - 99 mg/dL  Glucose, capillary     Status: Abnormal   Collection Time: 01/08/16 10:11 PM  Result Value Ref Range    Glucose-Capillary 176 (H) 65 - 99 mg/dL  Protime-INR     Status: Abnormal   Collection Time: 01/09/16  6:17 AM  Result Value Ref Range  Prothrombin Time 38.8 (H) 11.4 - 15.2 seconds   INR 3.85   Carbamazepine level, total     Status: None   Collection Time: 01/09/16  6:17 AM  Result Value Ref Range   Carbamazepine Lvl 8.4 4.0 - 12.0 ug/mL  Glucose, capillary     Status: None   Collection Time: 01/09/16  6:58 AM  Result Value Ref Range   Glucose-Capillary 67 65 - 99 mg/dL  Glucose, capillary     Status: None   Collection Time: 01/09/16  7:40 AM  Result Value Ref Range   Glucose-Capillary 65 65 - 99 mg/dL  Glucose, capillary     Status: Abnormal   Collection Time: 01/09/16  8:58 AM  Result Value Ref Range   Glucose-Capillary 149 (H) 65 - 99 mg/dL  Glucose, capillary     Status: Abnormal   Collection Time: 01/09/16 11:40 AM  Result Value Ref Range   Glucose-Capillary 134 (H) 65 - 99 mg/dL   Comment 1 Notify RN   Glucose, capillary     Status: None   Collection Time: 01/09/16  4:35 PM  Result Value Ref Range   Glucose-Capillary 93 65 - 99 mg/dL    Blood Alcohol level:  Lab Results  Component Value Date   ETH <5 28/78/6767    Metabolic Disorder Labs: Lab Results  Component Value Date   HGBA1C 7.4 (H) 01/02/2016   MPG 192 12/04/2015   MPG 318 (H) 08/24/2010   Lab Results  Component Value Date   PROLACTIN 4.4 12/11/2015   Lab Results  Component Value Date   CHOL 143 12/11/2015   TRIG 286 (H) 12/11/2015   HDL 32 (L) 12/11/2015   CHOLHDL 4.5 12/11/2015   VLDL 57 (H) 12/11/2015   LDLCALC 54 12/11/2015   LDLCALC 56 12/05/2015    Physical Findings: AIMS: Facial and Oral Movements Muscles of Facial Expression: None, normal Lips and Perioral Area: None, normal Jaw: None, normal Tongue: None, normal,Extremity Movements Upper (arms, wrists, hands, fingers): None, normal Lower (legs, knees, ankles, toes): None, normal, Trunk Movements Neck, shoulders, hips: None,  normal, Overall Severity Severity of abnormal movements (highest score from questions above): None, normal Incapacitation due to abnormal movements: None, normal Patient's awareness of abnormal movements (rate only patient's report): No Awareness, Dental Status Current problems with teeth and/or dentures?: No Does patient usually wear dentures?: No  CIWA:    COWS:     Musculoskeletal: Strength & Muscle Tone: within normal limits Gait & Station: normal Patient leans: N/A  Psychiatric Specialty Exam: Physical Exam  Nursing note and vitals reviewed. Constitutional: He appears well-developed and well-nourished.  HENT:  Head: Normocephalic and atraumatic.  Eyes: Conjunctivae are normal. Pupils are equal, round, and reactive to light.  Neck: Normal range of motion.  Cardiovascular: Normal heart sounds.   Respiratory: Effort normal. No respiratory distress.  GI: Soft.  Musculoskeletal: Normal range of motion.  Neurological: He is alert.  Skin: Skin is warm and dry.     Psychiatric: His affect is labile and inappropriate. His speech is tangential. He is hyperactive. Thought content is delusional. He expresses impulsivity. He expresses no homicidal and no suicidal ideation. He exhibits abnormal recent memory. He is inattentive.    Review of Systems  Constitutional: Negative.  Negative for chills, fever, malaise/fatigue and weight loss.  HENT: Negative.  Negative for hearing loss, nosebleeds, sore throat and tinnitus.   Eyes: Negative.  Negative for blurred vision and double vision.  Respiratory: Negative.  Negative for cough,  hemoptysis, sputum production, shortness of breath and wheezing.   Cardiovascular: Negative.  Negative for chest pain, palpitations and claudication.  Gastrointestinal: Negative.  Negative for abdominal pain, blood in stool, constipation, diarrhea, heartburn, nausea and vomiting.  Genitourinary: Negative for dysuria, frequency and urgency.  Musculoskeletal:  Negative.  Negative for back pain, falls, joint pain, myalgias and neck pain.  Skin: Negative.  Negative for itching and rash.  Neurological: Negative.  Negative for dizziness, tingling, tremors, focal weakness, seizures and headaches.  Endo/Heme/Allergies: Negative for environmental allergies. Does not bruise/bleed easily.  Psychiatric/Behavioral: Negative for depression, hallucinations, memory loss, substance abuse and suicidal ideas. The patient is not nervous/anxious and does not have insomnia.     Blood pressure (!) 88/64, pulse 85, temperature 98.3 F (36.8 C), resp. rate 20, height 6' (1.829 m), weight 133.4 kg (294 lb), SpO2 98 %.Body mass index is 39.87 kg/m.  General Appearance: Disheveled  Eye Contact:  Fair  Speech:  Pressured  Volume:  Increased  Mood:  Euphoric  Affect:  Labile  Thought Process:  Disorganized  Orientation:  Full (Time, Place, and Person)  Thought Content:  Illogical, Delusions, Rumination and Tangential  Suicidal Thoughts:  No  Homicidal Thoughts:  No  Memory:  Immediate;   Fair Recent;   Poor Remote;   Fair  Judgement:  Impaired  Insight:  Shallow  Psychomotor Activity:  Increased  Concentration:  Concentration: Poor  Recall:  [place] of Knowledge:  Good  Language:  Good  Akathisia:  No  Handed:  Right  AIMS (if indicated):     Assets:  Financial Resources/Insurance Housing Social Support  ADL's:  Impaired  Cognition:  Impaired,  Mild  Sleep:  Number of Hours: 6     Treatment Plan Summary:  Mr. Dommer 50 year old married Caucasian male with a history of bipolar disorder who was admitted to the hospital for ECT treatment for mania. He was just transferred down to the medicine floor after treatment for cellulitis of the leg.  Bipolar disorder: Most recent episode manic: The patient is currently receiving ECT treatment which has been helpful for him in the past. He is on a number of different psychotropic medications at very high dosages  but mania persists. He will continue on Tegretol 1200 mg by mouth twice a day, Lamictal 200 mg by mouth daily and Seroquel 1200 mg by mouth nightly. Patient also has Restoril 15 mg by mouth nightly for insomnia. Will have to monitor Tegretol level and liver enzymes. We'll have to monitor hemoglobin A1c, lipid panel and prolactin level as well. He is currently receiving ECT 3 times a week and so far denies any side effects from the ECT. He has when necessary Haldol IM and Ativan IM at pretty high doses as that appears to be effective for him. He remains disorganized and manic.  Hyperlipidemia: We'll plan to continue Lipitor 40 mg by mouth daily and fenofibrate 160 mg by mouth daily  Cellulitis of the leg: Will continue Augmentin 1 tablet by mouth twice a day. Cellulitis is slowly improving.  History of pulmonary embolism: We'll continue Coumadin 8 mg by mouth nightly.  Disposition: The patient will be discharged home with his wife because he does have a stable living situation. Psychotropic medication management follow-up appointment as well as individual therapy will be scheduled with Dr. Thurmond Butts.  ECT scheduled for tomorrow which will be his eighth treatment. Tegretol level was rechecked today and is 8.4 so it is much more into the therapeutic range.  Doesn't have any obvious side effects of it but we will keep an eye on his CBC. Warfarin level and INR are staying in the therapeutic region. Blood sugars good. No other change to medicine today. Tried to do a lot of educational and supportive therapy with the patient reminded him that we are working to get him back to his baseline where he can finally stay out of the hospital for a long period of time. He has been cooperative. Alethia Berthold, MD 01/09/2016, 7:01 PM

## 2016-01-09 NOTE — Progress Notes (Signed)
Patient attending group.  Patient remains 1:1 with sitter.  Patient voices no complaints or concerns at this time.  Will continue to monitor.

## 2016-01-09 NOTE — Progress Notes (Signed)
Patient in room.  Patient remains 1:1 with sitter.  Patient voices no concerns or complaints at this time.

## 2016-01-09 NOTE — Progress Notes (Signed)
Patient in the dayroom.  Patient remains 1:1 with a sitter.  Patient voices no complaints or concerns at this time.

## 2016-01-09 NOTE — Plan of Care (Signed)
Problem: Safety: Goal: Periods of time without injury will increase Outcome: Progressing Patient has remained free from falls/injuries this shift.  Patient continues to remain 1:1 with a sitter for safety.
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
## 2016-01-09 NOTE — Progress Notes (Signed)
Patient blood sugar checked and the result was 65. Patient hands were trembling and patient stated, "I feel like my blood sugar is so low".  Patient was tearful.  Patient was given 4 ounces of orange juice and 1 pack of graham crackers.  Will continue to monitor.

## 2016-01-09 NOTE — Progress Notes (Signed)
Patient ambulating in hallway.  Patient remains 1:1 with sitter.  Patient voices no complaints or concerns at this time.

## 2016-01-09 NOTE — Progress Notes (Signed)
Patient  In dayroom interacting with peers.  Patient remains 1:1 with sitter.  Patient voices no complaints and concerns at this time. Will continue to monitor.

## 2016-01-09 NOTE — Progress Notes (Signed)
D:  Patient has been pleasant and cooperative this shift.  Patient remains 1:1 with sitter for safety.   Patient attended groups. A:  Medications administered per orders.  Patient safety maintained with a 1:1 sitter and Q15 minute checks.  Patient encouraged to attend groups. R:  Patient attended group sessions held.  Patient remained safe and free from injuries this shift.

## 2016-01-09 NOTE — Progress Notes (Signed)
Inpatient Diabetes Program Recommendations  AACE/ADA: New Consensus Statement on Inpatient Glycemic Control (2015)  Target Ranges:  Prepandial:   less than 140 mg/dL      Peak postprandial:   less than 180 mg/dL (1-2 hours)      Critically ill patients:  140 - 180 mg/dL   Results for SYLVAN, GRIMARD (MRN [id number]) as of 01/09/2016 11:08  Ref. Range 01/09/2016 06:58 01/09/2016 07:40 01/09/2016 08:58  Glucose-Capillary Latest Ref Range: 65 - 99 mg/dL 67 65 149 (H)    Home DM Meds: Lantus 60 units daily       Novolog SSI  Current Insulin Orders: Lantus 60 units QHS           -Mild Hypoglycemia this AM (CBG 67 mg/dl) after receiving Lantus 60 units the night prior.  -Eating 100% of meals.     MD- Please consider the following in-hospital insulin adjustments:  1. Reduce Lantus slightly to 55 units QHS  2. Start Novolog Sensitive Correction Scale/ SSI (0-9 units) TID AC + HS      --Will follow patient during hospitalization--  Wyn Quaker RN, MSN, CDE Diabetes Coordinator Inpatient Glycemic Control Team Team Pager: [phone] (8a-5p)

## 2016-01-09 NOTE — Progress Notes (Signed)
Recreation Therapy Notes  Date: 08.08.17 Time: 3:00 pm Location: Craft Room  Group Topic: Goal Setting  Goal Area(s) Addresses:  Patient will write at least one goal. Patient will write at least one obstacle.  Behavioral Response: Attentive, Interactive, Inappropriate  Intervention: Recovery Goal Chart  Activity: Patients were instructed to make a Recovery Goal Chart including goals, obstacles, the date they started working on their goals, and the date they achieved their goals.  Education: LRT educated patients on healthy ways to celebrate reaching their goals.  Education Outcome: In group clarification offered  Clinical Observations/Feedback: Patient started working on outline of goals chart then started drawing lips. Patient drew the word women and made up acronyms about women. Patient contributed to group discussion by stating that he has people in his life to help him stay focused on his goals. During group discussion, patient turned to peer and said, "Do you know what WTF stands for?" When patient went to explain what it meant, LRT redirected patient. Patient stated it was just "two men talking". LRT informed patient it was not appropriate to talk about in group. Patient went back to working on the worksheet.  [name], LRT/CTRS 01/09/2016 4:09 PM

## 2016-01-09 NOTE — Plan of Care (Signed)
Problem: Safety: Goal: Ability to remain free from injury will improve Outcome: Progressing Patient has remained free of injury during this shift.

## 2016-01-09 NOTE — Progress Notes (Signed)
ANTICOAGULATION CONSULT NOTE - Follow Up Consult  Pharmacy Consult for Warfarin Indication: VTE treatment  Allergies  Allergen Reactions  . Asa [Aspirin] Other (See Comments)    Patient tolerates LOW DOSE ASPIRIN.    Patient Measurements: Height: 6' (182.9 cm) Weight: 294 lb (133.4 kg) IBW/kg (Calculated) : 77.6  Vital Signs: Temp: 98.3 F (36.8 C) (08/08 0500) BP: 108/64 (08/08 0500) Pulse Rate: 85 (08/08 0500)  Labs:  Recent Labs  01/07/16 0618 01/08/16 0646 01/09/16 0617  HGB 11.9*  --   --   HCT 34.3*  --   --   PLT 281  --   --   LABPROT 32.2* 33.6* 38.8*  INR 3.05 3.22 3.85   Estimated Creatinine Clearance: 77.2 mL/min (by C-G formula based on SCr of 1.6 mg/dL).  Medical History: Past Medical History:  Diagnosis Date  . Bipolar 1 disorder ([place])   . CKD (chronic kidney disease), stage III   . Diabetes mellitus without complication ([place])   . DVT (deep venous thrombosis) ([place])   . Hypercholesteremia   . Hypertension    Medications:  Warfarin   Assessment: 10 yom admitted to ED BHU with manic symptoms. Recently discharged from [facility] with DVT/PE, had been bridging LMWH and VKA prior to admission.  INR History: 7/9: INR - 1.99 7/10: no INR- warfarin 12.5 mg po daily at 0200 7/11: INR - 2.97- warfarin held 7/12: INR - 1.92 - warfarin 10 mg 7/13: INR - 1.95 - warfarin 10 mg 7/14: INR - 2.56 - warfarin 5 mg 7/15: INR - 2.73 - warfarin 5 mg 7/16: INR - 2.39 - warfarin 7.5 mg 7/17: INR - 2.22 - warfarin 7.5 mg 7/18: INR - 2.53 - warfarin 7.5 mg 7/19: INR - 2.75 - warfarin 7.5 mg 7/20: INR - 2.99 - warfarin 6.5 mg 7/21: INR - 2.64 - warfarin 6.5 mg  7/22: INR: 2.44; warfarin 8  7/23: INR: 2.60;  Warfarin 10mg  7/24: INR 2.44 - warfarin 10mg  7/25:  INR 3.22 - warfarin 10mg  7/26:  INR 3.08 - warfarin 8mg  7/27:  INR 3.24 - warfarin 8 mg 7/28:  INR 2.79 - 8 mg 7/29:  INR 2.74- 8 mg 7/30:  INR 3.19 - 7 mg 7/31:  INR 2.73  7.5mg  8/1:    INR 3.13   7.5mg  8/2:    INR 2.74  7.5mg  8/3     INR 2.27  7.5mg  8/4     INR 3.19  8 mg 8/5     INR 3.04  7.5 mg 8/6     INR 3.05  7.5mg  8/7:    INR  3.22  6mg  8/8:    INR  3.85  Dose held per protocol  Goal of Therapy:  INR 2-3 Monitor platelets by anticoagulation protocol: Yes   Plan:  INR is supra-therapeutic today. Will hold Warfarin today per protocol. Follow up INR in the AM. Last CBC 8/6.   (Patient with orders for carbamazepine which interacts with warfarin to decrease the INR.  Will follow INR closely.)   Pt will need CBC q3 days per policy.   Pharmacy will continue to follow.   Loree Fee, [facility] Clinical Pharmacist 01/09/2016,8:11 AM

## 2016-01-09 NOTE — Progress Notes (Signed)
D: Patient still very tangential. He denies SI/HI/AVH. Patient complaining of a HA. He has been visible in the milieu interacting with staff and peers.  A: Medication was given with education. Encouragement was provided. PRN Tylenol given for pain R: Patient was compliant with medication. He has remained calm and cooperative. Safety maintained with 15 min checks. Patient also remains with 1:1 sitter.

## 2016-01-09 NOTE — Tx Team (Signed)
Interdisciplinary Treatment Plan Update (Adult)  Date:  01/09/2016 Time Reviewed:  2:45 PM  Progress in Treatment: Attending groups: Yes. Participating in groups:  Yes. Taking medication as prescribed:  Yes. Tolerating medication:  Yes. Family/Significant othe contact made:  No, will contact:    Patient understands diagnosis:  Yes. Discussing patient identified problems/goals with staff:  Yes. Medical problems stabilized or resolved:  Yes. Denies suicidal/homicidal ideation: Yes. Issues/concerns per patient self-inventory:  No. Other:  New problem(s) identified: No, Describe:     Discharge Plan or Barriers:Home witih wife, Medication Management follow up with Dr. Thurmond Butts  Reason for Continuation of Hospitalization: Depression Mania Medication stabilization Other; describe ECT treatment, Labile mood  Comments:Pt more redirectable, remains labile, hyperverbal, disorganized, grandiose.   Otherwise cooperative, remains on one to one sitter due to continuing unpredictable behavior.  Estimated length of stay:7 days  New goal(s):  Review of initial/current patient goals per problem list:   1. Goal(s): Patient will participate in aftercare plan   Met: Yes Target date: 3-5 days post admission date   As evidenced by: Patient will participate within aftercare plan AEB aftercare provider and housing plan at discharge being identified.                        8/4: Pt will later discharge home to [facility] to live with his family and will follow up with [facility] for medication management and with [place] for therapy   2. . Goal(s): Patient will demonstrate decreased signs of psychosis  * Met: No * Target date: 3-5 days post admission date  * As evidenced by: Patient will demonstrate decreased frequency of AVH or return to baseline function                        8/4: Goal progressing.  Pt denies AVH   8/8-goal progressing  3. Goal (s): Patient will  demonstrate decreased signs of mania  * Met: No * Target date: 3-5 days post admission date  * As evidenced by: Patient demonstrate decreased signs of mania AEB decreased mood instability and demonstration of stable mood                        8/4: Goal progressing.   8/8-goal progressing Attendees: Patient:  Cameron [name] 8/8/20172:45 PM  Family:   8/8/20172:45 PM  Physician:  Dr. Weber Cooks 8/8/20172:45 PM  Nursing:   Elige Radon, RN 8/8/20172:45 PM  Case Manager:   8/8/20172:45 PM  Counselor:  Dossie Arbour, LCSW 8/8/20172:45 PM  Other:  Everitt Amber, [place] 8/8/20172:45 PM  Other:   8/8/20172:45 PM  Other:   8/8/20172:45 PM  Other:  8/8/20172:45 PM  Other:  8/8/20172:45 PM  Other:  8/8/20172:45 PM  Other:  8/8/20172:45 PM  Other:  8/8/20172:45 PM  Other:  8/8/20172:45 PM  Other:   8/8/20172:45 PM   Scribe for Treatment Team:   Dossie Arbour P,MSW, LCSW 01/09/2016, 2:45 PM

## 2016-01-09 NOTE — Progress Notes (Signed)
Patient ambulating in hallway.  Patient remains 1:1 with sitter.  Patient voices no complaints or concerns at this time.  Will continue to monitor.

## 2016-01-09 NOTE — Progress Notes (Signed)
Patient eating lunch.  Patient remains 1:1 with sitter.  Patient voices no complaints or concerns at this time.  Will continue to monitor.

## 2016-01-09 NOTE — Progress Notes (Signed)
Patient attending group.  Patient remains 1:1 with sitter.  Patient voices no concerns or complaints at this time.  Will continue to monitor.

## 2016-01-09 NOTE — Progress Notes (Signed)
Ms Cameron [name], during visitation with her daughter, asked if patient is still on ECT; she is concerned that patient is not getting better and would like for him to continue with ECT. Cameron [name] is not as loud, not as intrusive but continues to be "talkative", loose association - Circumstantial Type, animated "Elephant Noise" etc. No crying spells, denied SI/HI, denied AV/H, just manic.

## 2016-01-10 ENCOUNTER — Inpatient Hospital Stay: Payer: 59 | Admitting: Anesthesiology

## 2016-01-10 ENCOUNTER — Other Ambulatory Visit: Payer: Self-pay | Admitting: Psychiatry

## 2016-01-10 ENCOUNTER — Encounter: Payer: Self-pay | Admitting: *Deleted

## 2016-01-10 LAB — GLUCOSE, CAPILLARY
GLUCOSE-CAPILLARY: 94 mg/dL (ref 65–99)
Glucose-Capillary: 102 mg/dL — ABNORMAL HIGH (ref 65–99)
Glucose-Capillary: 113 mg/dL — ABNORMAL HIGH (ref 65–99)

## 2016-01-10 LAB — PROTIME-INR
INR: 2.4
Prothrombin Time: 26.6 seconds — ABNORMAL HIGH (ref 11.4–15.2)

## 2016-01-10 MED ORDER — SUCCINYLCHOLINE CHLORIDE 200 MG/10ML IV SOSY
PREFILLED_SYRINGE | INTRAVENOUS | Status: DC | PRN
  Administered 2016-01-10: 150 mg via INTRAVENOUS

## 2016-01-10 MED ORDER — MIDAZOLAM HCL 2 MG/2ML IJ SOLN
INTRAMUSCULAR | Status: DC | PRN
  Administered 2016-01-10: 2 mg via INTRAVENOUS

## 2016-01-10 MED ORDER — METHOHEXITAL SODIUM 100 MG/10ML IV SOSY
PREFILLED_SYRINGE | INTRAVENOUS | Status: DC | PRN
  Administered 2016-01-10: 100 mg via INTRAVENOUS

## 2016-01-10 MED ORDER — KETOROLAC TROMETHAMINE 30 MG/ML IJ SOLN
30.0000 mg | Freq: Once | INTRAMUSCULAR | Status: AC
  Administered 2016-01-10: 30 mg via INTRAVENOUS

## 2016-01-10 MED ORDER — SODIUM CHLORIDE 0.9 % IV SOLN
250.0000 mL | Freq: Once | INTRAVENOUS | Status: AC
  Administered 2016-01-10: 500 mL via INTRAVENOUS

## 2016-01-10 MED ORDER — LABETALOL HCL 5 MG/ML IV SOLN
INTRAVENOUS | Status: DC | PRN
  Administered 2016-01-10: 5 mg via INTRAVENOUS

## 2016-01-10 MED ORDER — WARFARIN SODIUM 3 MG PO TABS
6.0000 mg | ORAL_TABLET | Freq: Every day | ORAL | Status: DC
  Administered 2016-01-10: 6 mg via ORAL
  Filled 2016-01-10: qty 2

## 2016-01-10 MED ORDER — KETOROLAC TROMETHAMINE 30 MG/ML IJ SOLN
INTRAMUSCULAR | Status: AC
  Filled 2016-01-10: qty 1

## 2016-01-10 MED ORDER — SODIUM CHLORIDE 0.9 % IV SOLN
INTRAVENOUS | Status: DC | PRN
  Administered 2016-01-10: 11:00:00 via INTRAVENOUS

## 2016-01-10 NOTE — B[facility] Group Notes (Signed)
[facility] Group Notes:  (Nursing/MHT/Case Management/Adjunct)  Date:  01/10/2016  Time:  5:00 PM  Type of Therapy:  Psychoeducational Skills  Participation Level:  Active  Participation Quality:  Inattentive and Monopolizing  Affect:  Excited  Cognitive:  Disorganized and Confused  Insight:  Lacking  Engagement in Group:  Off Topic and Poor  Modes of Intervention:  Discussion and Education  Summary of Progress/Problems:  Primitivo Gauze 01/10/2016, 5:00 PM

## 2016-01-10 NOTE — Progress Notes (Signed)
Remains on one to one of staff with safety maintained.

## 2016-01-10 NOTE — Progress Notes (Signed)
Patient with sad affect, cooperative behavior with meals, meds and plan of care. No discomfort s/p ECT. Eats lunch and po fluids encouraged and tolerated well. Safety maintained.

## 2016-01-10 NOTE — Progress Notes (Signed)
Patient remains tangential, hyper verbal and with mania and remains on one to one with staff and safety is maintained.

## 2016-01-10 NOTE — Progress Notes (Signed)
Patient escorted to ECT. Remains 1:1 of staff at this time.

## 2016-01-10 NOTE — Progress Notes (Signed)
Patient with sad affect, remains on one to one with staff. Patient is tangential, hyper verbal and safety is maintained.

## 2016-01-10 NOTE — Progress Notes (Addendum)
Patient remains on 1:1 this am. Patient remains depressed, hyper verbal and with tangential thoughts. NPO for ECT.

## 2016-01-10 NOTE — H&P (Signed)
Cameron [name] is an 50 y.o. male.   Chief Complaint: Patient has no specific complaint. HPI: He has bipolar disorder and is manic. Symptoms are improving although he still has dramatic flight of ideas to a degree of being unable to converse or interact appropriately  Past Medical History:  Diagnosis Date  . Bipolar 1 disorder ([place])   . CKD (chronic kidney disease), stage III   . Diabetes mellitus without complication ([place])   . DVT (deep venous thrombosis) ([place])   . Hypercholesteremia   . Hypertension     Past Surgical History:  Procedure Laterality Date  . APPENDECTOMY      Family History  Problem Relation Age of Onset  . Stroke Other   . Diabetes Other    Social History:  reports that he has never smoked. He has never used smokeless tobacco. He reports that he drinks alcohol. He reports that he does not use drugs.  Allergies:  Allergies  Allergen Reactions  . Asa [Aspirin] Other (See Comments)    Patient tolerates LOW DOSE ASPIRIN.    Medications Prior to Admission  Medication Sig Dispense Refill  . enalapril (VASOTEC) 5 MG tablet Take 5 mg by mouth 2 (two) times daily.    Marland Kitchen amoxicillin-clavulanate (AUGMENTIN) 875-125 MG tablet Take 1 tablet by mouth 2 (two) times daily. X 7 more days 14 tablet 0  . ARIPiprazole (ABILIFY) 30 MG tablet Take 30 mg by mouth every evening.     Marland Kitchen atorvastatin (LIPITOR) 40 MG tablet Take 40 mg by mouth daily.  11  . carbamazepine (TEGRETOL) 200 MG tablet Take 2 tablets (400 mg total) by mouth every morning. 30 tablet 0  . carbamazepine (TEGRETOL) 200 MG tablet Take 3 tablets (600 mg total) by mouth every evening. 30 tablet 0  . fenofibrate 160 MG tablet Take 160 mg by mouth daily.    . insulin aspart (NOVOLOG) 100 UNIT/ML injection Inject 0-9 Units into the skin 3 (three) times daily with meals. 10 mL 11  . insulin aspart (NOVOLOG) 100 UNIT/ML injection Inject 0-5 Units into the skin at bedtime. 10 mL 11  . Insulin Glargine (LANTUS SOLOSTAR)  100 UNIT/ML Solostar Pen Inject 60 Units into the skin daily.     Marland Kitchen lamoTRIgine (LAMICTAL) 100 MG tablet Take 400 mg by mouth at bedtime.    . Multiple Vitamin (MULTIVITAMIN WITH MINERALS) TABS tablet Take 1 tablet by mouth daily.    . QUEtiapine (SEROQUEL) 400 MG tablet Take 3 tablets (1,200 mg total) by mouth at bedtime. 90 tablet 0  . TANZEUM 50 MG PEN Inject 50 mg into the skin every 7 (seven) days.  2  . temazepam (RESTORIL) 15 MG capsule Take 15 mg by mouth at bedtime as needed for sleep.    Marland Kitchen warfarin (COUMADIN) 7.5 MG tablet Take 1 tablet (7.5 mg total) by mouth daily at 6 PM. 30 tablet 0    Results for orders placed or performed during the hospital encounter of 01/04/16 (from the past 48 hour(s))  Glucose, capillary     Status: Abnormal   Collection Time: 01/08/16  4:44 PM  Result Value Ref Range   Glucose-Capillary 101 (H) 65 - 99 mg/dL   Comment 1 Notify RN   Glucose, capillary     Status: None   Collection Time: 01/08/16  8:27 PM  Result Value Ref Range   Glucose-Capillary 94 65 - 99 mg/dL  Glucose, capillary     Status: Abnormal   Collection Time: 01/08/16  10:11 PM  Result Value Ref Range   Glucose-Capillary 176 (H) 65 - 99 mg/dL  Protime-INR     Status: Abnormal   Collection Time: 01/09/16  6:17 AM  Result Value Ref Range   Prothrombin Time 38.8 (H) 11.4 - 15.2 seconds   INR 3.85   Carbamazepine level, total     Status: None   Collection Time: 01/09/16  6:17 AM  Result Value Ref Range   Carbamazepine Lvl 8.4 4.0 - 12.0 ug/mL  Glucose, capillary     Status: None   Collection Time: 01/09/16  6:58 AM  Result Value Ref Range   Glucose-Capillary 67 65 - 99 mg/dL  Glucose, capillary     Status: None   Collection Time: 01/09/16  7:40 AM  Result Value Ref Range   Glucose-Capillary 65 65 - 99 mg/dL  Glucose, capillary     Status: Abnormal   Collection Time: 01/09/16  8:58 AM  Result Value Ref Range   Glucose-Capillary 149 (H) 65 - 99 mg/dL  Glucose, capillary      Status: Abnormal   Collection Time: 01/09/16 11:40 AM  Result Value Ref Range   Glucose-Capillary 134 (H) 65 - 99 mg/dL   Comment 1 Notify RN   Glucose, capillary     Status: None   Collection Time: 01/09/16  4:35 PM  Result Value Ref Range   Glucose-Capillary 93 65 - 99 mg/dL  Glucose, capillary     Status: Abnormal   Collection Time: 01/09/16  8:32 PM  Result Value Ref Range   Glucose-Capillary 114 (H) 65 - 99 mg/dL   Comment 1 Notify RN   Glucose, capillary     Status: Abnormal   Collection Time: 01/10/16  6:47 AM  Result Value Ref Range   Glucose-Capillary 102 (H) 65 - 99 mg/dL   Comment 1 Notify RN   Protime-INR     Status: Abnormal   Collection Time: 01/10/16  7:21 AM  Result Value Ref Range   Prothrombin Time 26.6 (H) 11.4 - 15.2 seconds   INR 2.40    No results found.  Review of Systems  Constitutional: Negative.   HENT: Negative.   Eyes: Negative.   Respiratory: Negative.   Cardiovascular: Negative.   Gastrointestinal: Negative.   Musculoskeletal: Negative.   Skin: Negative.   Neurological: Negative.   Psychiatric/Behavioral: Negative for depression, hallucinations, memory loss, substance abuse and suicidal ideas. The patient has insomnia. The patient is not nervous/anxious.     Blood pressure 133/80, pulse 89, temperature 98.9 F (37.2 C), resp. rate 18, height 6' (1.829 m), weight 131.1 kg (289 lb), SpO2 98 %. Physical Exam  Nursing note and vitals reviewed. Constitutional: He appears well-developed and well-nourished.  HENT:  Head: Normocephalic and atraumatic.  Eyes: Conjunctivae are normal. Pupils are equal, round, and reactive to light.  Neck: Normal range of motion.  Cardiovascular: Regular rhythm and normal heart sounds.   Respiratory: Effort normal. No respiratory distress.  GI: Soft.  Musculoskeletal: Normal range of motion.  Neurological: He is alert.  Skin: Skin is warm and dry.     Psychiatric: His affect is labile. His speech is rapid  and/or pressured and tangential. Cognition and memory are normal. He expresses impulsivity.  Flight of ideas He is inattentive.     Assessment/Plan ECT today. Has been tolerated well. I would like to continue with follow-up treatment on Friday.  Alethia Berthold, MD 01/10/2016, 11:01 AM

## 2016-01-10 NOTE — Progress Notes (Signed)
Patient in ECT department.

## 2016-01-10 NOTE — Progress Notes (Signed)
Remains on one to one of staff and safety is maintained.

## 2016-01-10 NOTE — Anesthesia Preprocedure Evaluation (Signed)
Anesthesia Evaluation  Patient identified by MRN, date of birth, ID band Patient awake    Reviewed: Allergy & Precautions, NPO status , Patient's Chart, lab work & pertinent test results  History of Anesthesia Complications Negative for: history of anesthetic complications  Airway Mallampati: II  TM Distance: >3 FB Neck ROM: Full    Dental no notable dental hx.    Pulmonary neg COPD,    breath sounds clear to auscultation- rhonchi (-) wheezing      Cardiovascular hypertension, Pt. on medications (-) angina+ Peripheral Vascular Disease  (-) CAD and (-) Past MI  Rhythm:Regular Rate:Normal - Systolic murmurs and - Diastolic murmurs    Neuro/Psych PSYCHIATRIC DISORDERS negative neurological ROS     GI/Hepatic negative GI ROS, Neg liver ROS,   Endo/Other  diabetes, Well Controlled, Insulin Dependent  Renal/GU CRFRenal disease (baseline Cr 1.6)     Musculoskeletal negative musculoskeletal ROS (+)   Abdominal (+) + obese,   Peds  Hematology negative hematology ROS (+)   Anesthesia Other Findings Past Medical History: No date: Bipolar 1 disorder (HCC) No date: CKD (chronic kidney disease), stage III No date: Diabetes mellitus without complication ([facility]) No date: DVT (deep venous thrombosis) ([facility]) No date: Hypercholesteremia No date: Hypertension   Reproductive/Obstetrics                             Anesthesia Physical  Anesthesia Plan  ASA: III  Anesthesia Plan: General   Post-op Pain Management:    Induction: Intravenous  Airway Management Planned: Mask  Additional Equipment:   Intra-op Plan: Delibrate Circulatory arrest per surgeon request  Post-operative Plan:   Informed Consent: I have reviewed the patients History and Physical, chart, labs and discussed the procedure including the risks, benefits and alternatives for the proposed anesthesia with the patient or authorized  representative who has indicated his/her understanding and acceptance.     Plan Discussed with: Anesthesiologist and CRNA  Anesthesia Plan Comments:         Anesthesia Quick Evaluation

## 2016-01-10 NOTE — Progress Notes (Signed)
ANTICOAGULATION CONSULT NOTE - Follow Up Consult  Pharmacy Consult for Warfarin Indication: VTE treatment  Allergies  Allergen Reactions  . Asa [Aspirin] Other (See Comments)    Patient tolerates LOW DOSE ASPIRIN.    Patient Measurements: Height: 6' (182.9 cm) Weight: 294 lb (133.4 kg) IBW/kg (Calculated) : 77.6  Vital Signs: Temp: 97.9 F (36.6 C) (08/09 0714) Temp Source: Oral (08/09 0714) BP: 119/76 (08/09 0714) Pulse Rate: 95 (08/09 0714)  Labs:  Recent Labs  01/08/16 0646 01/09/16 0617 01/10/16 0721  LABPROT 33.6* 38.8* 26.6*  INR 3.22 3.85 2.40   Estimated Creatinine Clearance: 77.2 mL/min (by C-G formula based on SCr of 1.6 mg/dL).  Medical History: Past Medical History:  Diagnosis Date  . Bipolar 1 disorder ([place])   . CKD (chronic kidney disease), stage III   . Diabetes mellitus without complication ([place])   . DVT (deep venous thrombosis) ([place])   . Hypercholesteremia   . Hypertension    Medications:  Warfarin   Assessment: 40 yom admitted to ED BHU with manic symptoms. Recently discharged from [facility] with DVT/PE, had been bridging LMWH and VKA prior to admission.  INR History: 7/9: INR - 1.99 7/10: no INR- warfarin 12.5 mg po daily at 0200 7/11: INR - 2.97- warfarin held 7/12: INR - 1.92 - warfarin 10 mg 7/13: INR - 1.95 - warfarin 10 mg 7/14: INR - 2.56 - warfarin 5 mg 7/15: INR - 2.73 - warfarin 5 mg 7/16: INR - 2.39 - warfarin 7.5 mg 7/17: INR - 2.22 - warfarin 7.5 mg 7/18: INR - 2.53 - warfarin 7.5 mg 7/19: INR - 2.75 - warfarin 7.5 mg 7/20: INR - 2.99 - warfarin 6.5 mg 7/21: INR - 2.64 - warfarin 6.5 mg  7/22: INR: 2.44; warfarin 8  7/23: INR: 2.60;  Warfarin 10mg  7/24: INR 2.44 - warfarin 10mg  7/25:  INR 3.22 - warfarin 10mg  7/26:  INR 3.08 - warfarin 8mg  7/27:  INR 3.24 - warfarin 8 mg 7/28:  INR 2.79 - 8 mg 7/29:  INR 2.74- 8 mg 7/30:  INR 3.19 - 7 mg 7/31:  INR 2.73  7.5mg  8/1:    INR 3.13  7.5mg  8/2:    INR 2.74  7.5mg  8/3      INR 2.27  7.5mg  8/4     INR 3.19  8 mg 8/5     INR 3.04  7.5 mg 8/6     INR 3.05  7.5mg  8/7:    INR  3.22  6mg  8/8:    INR  3.85  Dose held per protocol 8/9:    INR 2.4  Goal of Therapy:  INR 2-3 Monitor platelets by anticoagulation protocol: Yes   Plan:  INR is therapeutic again. Will resume dose of 6 mg and Follow up INR in the AM. Ordered CBC for tomorrow per policy  (Patient with orders for carbamazepine which interacts with warfarin to decrease the INR.  Will follow INR closely.)   Pt will need CBC q3 days per policy.   Pharmacy will continue to follow.   Prudy Feeler, [facility] Clinical Pharmacist 01/10/2016,8:49 AM

## 2016-01-10 NOTE — Progress Notes (Signed)
Safety maintained with patient one to one with staff.

## 2016-01-10 NOTE — Plan of Care (Signed)
Problem: Safety: Goal: Ability to remain free from injury will improve Outcome: Progressing Medications administered as ordered by the physician, medications Therapeutic Effects, SEs and Adverse effects discussed, questions encouraged; no PRN given, continuous 1:1 observations maintained for safety, clinical and moral support provided, patient encouraged to continue to express feelings and demonstrate safe care. Patient remains free from harm, will continue to monitor.

## 2016-01-10 NOTE — Progress Notes (Signed)
Patient returns from ECT. Blood glucose monitored and recorded and reported to Probation officer by ECT nurse. No s/s of hypo/hyperglycemia. No SI/HI at this time. Takes am meds. Patient to remain 1:1 of staff at this time. Patient remains impulsive, intrusive and hyper verbal at this time. Safety maintained.

## 2016-01-10 NOTE — Progress Notes (Signed)
Patient remains on one to one with staff for mania and safety is maintained.

## 2016-01-10 NOTE — Procedures (Signed)
ECT SERVICES Physician's Interval Evaluation & Treatment Note  Patient Identification: Cameron [name] MRN:  [id number] Date of Evaluation:  01/10/2016 TX #: [id number]  MADRS:   MMSE:   P.E. Findings:  Legs are looking definitely better. Vital signs stable. Lungs and heart normal.  Psychiatric Interval Note:  Mania gradually improving with the improvement in his agitation although he is still showing flight of ideas and hyperverbal today.  Subjective:  Patient is a 50 y.o. male seen for evaluation for Electroconvulsive Therapy. No specific complaint  Treatment Summary:   []   Right Unilateral             [x]  Bilateral   % Energy : 1.0 ms 100%   Impedance: 330 ohms  Seizure Energy Index: 960 V squared  Postictal Suppression Index: Less than 10%  Seizure Concordance Index: 91%  Medications  Pre Shock: Toradol 30 mg, Brevital 100 mg, succinylcholine 150 mg  Post Shock:  Seizure Duration: Motor seizure not detected. EEG seizure clearly happened but in point difficult to assess. Computer read 52 seconds   Comments: I would like to continue treatment and follow-up on Friday.   Lungs:  [x]   Clear to auscultation               []  Other:   Heart:    [x]   Regular rhythm             []  irregular rhythm    [x]   Previous H&P reviewed, patient examined and there are NO CHANGES                 []   Previous H&P reviewed, patient examined and there are changes noted.   Alethia Berthold, MD 8/9/201711:03 AM

## 2016-01-10 NOTE — Anesthesia Procedure Notes (Signed)
Date/Time: 01/10/2016 11:09 AM Performed by: Dionne Bucy Pre-anesthesia Checklist: Patient identified, Emergency Drugs available, Suction available and Patient being monitored Patient Re-evaluated:Patient Re-evaluated prior to inductionOxygen Delivery Method: Circle system utilized Preoxygenation: Pre-oxygenation with 100% oxygen Intubation Type: IV induction Ventilation: Mask ventilation without difficulty and Mask ventilation throughout procedure Airway Equipment and Method: Bite block Placement Confirmation: positive ETCO2 Dental Injury: Teeth and Oropharynx as per pre-operative assessment

## 2016-01-10 NOTE — Anesthesia Postprocedure Evaluation (Signed)
Anesthesia Post Note  Patient: Cameron [name]  Procedure(s) Performed: * No procedures listed *  Patient location during evaluation: PACU Anesthesia Type: General Level of consciousness: awake and alert Pain management: pain level controlled Vital Signs Assessment: post-procedure vital signs reviewed and stable Respiratory status: spontaneous breathing, nonlabored ventilation, respiratory function stable and patient connected to nasal cannula oxygen Cardiovascular status: blood pressure returned to baseline and stable Postop Assessment: no signs of nausea or vomiting Anesthetic complications: no    Last Vitals:  Vitals:   01/10/16 1144 01/10/16 1150  BP: 131/65   Pulse: 85 86  Resp: 19 (!) 24  Temp: 37.2 C     Last Pain:  Vitals:   01/10/16 0903  TempSrc:   PainSc: 3                  Precious Haws [place]it[place]

## 2016-01-10 NOTE — Progress Notes (Signed)
Patient safety maintained with patient on one to one with staff.

## 2016-01-10 NOTE — Progress Notes (Signed)
Patient remains on 1:1 this am. Patient remains depressed, hyper verbal and with tangential thoughts. NPO for ECT. Denies SI/HU, states he will refuse ECT. Takes vasotec.

## 2016-01-10 NOTE — Plan of Care (Signed)
Problem: Education: Goal: Verbalization of understanding the information provided will improve Outcome: Not Progressing Patient remains with tangential thought process and states he will refuse ECT this morning.

## 2016-01-10 NOTE — Progress Notes (Signed)
Recreation Therapy Notes  Date: 08.09.17 Time: 9:30 am Location: Craft Room  Group Topic: Self-esteem  Goal Area(s) Addresses:  Patient will write at least one positive trait about self. Patient will verbalize benefit of having healthy self-esteem.  Behavioral Response: Did not attend  Intervention: I Am  Activity: Patients were given a worksheet with the letter I on it and instructed to write as many positive traits about themselves inside the letter.  Education: LRT educated patients on ways they can increase their self-esteem.  Education Outcome: Patient did not attend group.  Clinical Observations/Feedback: Patient did not attend group.  [name], LRT/CTRS 01/10/2016 10:28 AM

## 2016-01-10 NOTE — Transfer of Care (Signed)
Immediate Anesthesia Transfer of Care Note  Patient: Cameron [name]  Procedure(s) Performed: ECT  Patient Location: PACU  Anesthesia Type:General  Level of Consciousness: awake and patient cooperative  Airway & Oxygen Therapy: Patient Spontanous Breathing and Patient connected to face mask oxygen  Post-op Assessment: Report given to RN  Post vital signs: Reviewed and stable  Last Vitals:  Vitals:   01/10/16 0903 01/10/16 1124  BP: 133/80 (!) 145/93  Pulse: 89 89  Resp: 18 14  Temp: 37.2 C 36.7 C    Last Pain:  Vitals:   01/10/16 0903  TempSrc:   PainSc: 3       Patients Stated Pain Goal: 0 ([id number])  Complications: No apparent anesthesia complications

## 2016-01-10 NOTE — Progress Notes (Signed)
Patient with one to one for behavior and safety maintained.

## 2016-01-10 NOTE — Progress Notes (Signed)
[facility] MD Progress Note  01/10/2016 8:03 PM Cameron [name]  MRN:  [id number] Subjective:    Follow-up for this 50 year old man with bipolar disorder manic. Follow-up Wednesday the 19th. Patient had ECT treatment #8 today. Treatment was tolerated well without difficulty. His intensity of his affect seems to be slowing down a little bit although he still remains disorganized in his thinking with flight of ideas constantly that would make it impossible for him to function in any kind of normal milieu. He has not been aggressive or violent with his behavior since last week as far as I know. He is compliant with treatment. No new physical side effects complained of.  Principal Problem: Bipolar I disorder, current or most recent episode manic, with psychotic features ([place]) Diagnosis:   Patient Active Problem List   Diagnosis Date Noted  . Bipolar affective disorder, current episode manic ([place]) [F31.9] 01/04/2016  . Cellulitis of leg, right [L03.115] 01/01/2016  . Bipolar I disorder, current or most recent episode manic, with psychotic features ([place]) [F31.2] 12/11/2015  . Pulmonary embolism ([place]) [I26.99] 12/04/2015  . Diabetes mellitus without complication ([place]) [id number]   . Hypercholesteremia [E78.00]   . CKD (chronic kidney disease), stage III [N18.3]   . Acute deep vein thrombosis (DVT) of femoral vein of right lower extremity (HCC) [I82.411]    Total Time spent with patient: 20 minutes  Past Psychiatric History: Long history of bipolar disorder multiple manic episodes. History of response to ECT.  Past Medical History:  Past Medical History:  Diagnosis Date  . Bipolar 1 disorder ([place])   . CKD (chronic kidney disease), stage III   . Diabetes mellitus without complication ([place])   . DVT (deep venous thrombosis) ([place])   . Hypercholesteremia   . Hypertension     Past Surgical History:  Procedure Laterality Date  . APPENDECTOMY     Family History:  Family History  Problem Relation Age of  Onset  . Stroke Other   . Diabetes Other    Family Psychiatric  History: Positive for bipolar disorder Social History:  History  Alcohol Use  . Yes    Comment: rarely     History  Drug Use No    Social History   Social History  . Marital status: Married    Spouse name: N/A  . Number of children: N/A  . Years of education: N/A   Social History Main Topics  . Smoking status: Never Smoker  . Smokeless tobacco: Never Used  . Alcohol use Yes     Comment: rarely  . Drug use: No  . Sexual activity: Not Asked   Other Topics Concern  . None   Social History Narrative  . None   Additional Social History:                         Sleep: Fair  Appetite:  Fair  Current Medications: Current Facility-Administered Medications  Medication Dose Route Frequency Provider Last Rate Last Dose  . acetaminophen (TYLENOL) tablet 650 mg  650 mg Oral Q6H PRN Gonzella Lex, MD   650 mg at 01/08/16 2202  . alum & mag hydroxide-simeth (MAALOX/MYLANTA) 200-200-20 MG/5ML suspension 30 mL  30 mL Oral Q4H PRN Gonzella Lex, MD      . amoxicillin-clavulanate (AUGMENTIN) 875-125 MG per tablet 1 tablet  1 tablet Oral Q12H Gonzella Lex, MD   1 tablet at 01/10/16 1207  . atorvastatin (LIPITOR) tablet 40 mg  40 mg Oral q1800 Gonzella Lex, MD   40 mg at 01/10/16 1654  . carbamazepine (TEGRETOL) tablet 1,200 mg  1,200 mg Oral BID PC Gonzella Lex, MD   1,200 mg at 01/10/16 1655  . dextrose 5 % solution 250 mL  250 mL Intravenous Once Gonzella Lex, MD      . enalapril (VASOTEC) tablet 5 mg  5 mg Oral BID Gonzella Lex, MD   5 mg at 01/10/16 0815  . fenofibrate tablet 160 mg  160 mg Oral Daily Gonzella Lex, MD   160 mg at 01/10/16 1208  . haloperidol lactate (HALDOL) injection 20 mg  20 mg Intramuscular Q6H PRN Gonzella Lex, MD      . insulin glargine (LANTUS) injection 60 Units  60 Units Subcutaneous QHS Gonzella Lex, MD   60 Units at 01/09/16 2125  . lamoTRIgine (LAMICTAL)  tablet 200 mg  200 mg Oral Daily Gonzella Lex, MD   200 mg at 01/10/16 1208  . LORazepam (ATIVAN) injection 2 mg  2 mg Intramuscular Q6H PRN Gonzella Lex, MD      . magnesium hydroxide (MILK OF MAGNESIA) suspension 30 mL  30 mL Oral Daily PRN Gonzella Lex, MD      . QUEtiapine (SEROQUEL) tablet 1,200 mg  1,200 mg Oral QHS Gonzella Lex, MD   1,200 mg at 01/09/16 2118  . temazepam (RESTORIL) capsule 15 mg  15 mg Oral QHS Gonzella Lex, MD   15 mg at 01/09/16 2118  . warfarin (COUMADIN) tablet 6 mg  6 mg Oral q1800 Gonzella Lex, MD   6 mg at 01/10/16 1800  . Warfarin - Pharmacist Dosing Inpatient   Does not apply q1800 Gonzella Lex, MD   Stopped at 01/09/16 1800    Lab Results:  Results for orders placed or performed during the hospital encounter of 01/04/16 (from the past 48 hour(s))  Glucose, capillary     Status: None   Collection Time: 01/08/16  8:27 PM  Result Value Ref Range   Glucose-Capillary 94 65 - 99 mg/dL  Glucose, capillary     Status: Abnormal   Collection Time: 01/08/16 10:11 PM  Result Value Ref Range   Glucose-Capillary 176 (H) 65 - 99 mg/dL  Protime-INR     Status: Abnormal   Collection Time: 01/09/16  6:17 AM  Result Value Ref Range   Prothrombin Time 38.8 (H) 11.4 - 15.2 seconds   INR 3.85   Carbamazepine level, total     Status: None   Collection Time: 01/09/16  6:17 AM  Result Value Ref Range   Carbamazepine Lvl 8.4 4.0 - 12.0 ug/mL  Glucose, capillary     Status: None   Collection Time: 01/09/16  6:58 AM  Result Value Ref Range   Glucose-Capillary 67 65 - 99 mg/dL  Glucose, capillary     Status: None   Collection Time: 01/09/16  7:40 AM  Result Value Ref Range   Glucose-Capillary 65 65 - 99 mg/dL  Glucose, capillary     Status: Abnormal   Collection Time: 01/09/16  8:58 AM  Result Value Ref Range   Glucose-Capillary 149 (H) 65 - 99 mg/dL  Glucose, capillary     Status: Abnormal   Collection Time: 01/09/16 11:40 AM  Result Value Ref Range    Glucose-Capillary 134 (H) 65 - 99 mg/dL   Comment 1 Notify RN   Glucose, capillary     Status:  None   Collection Time: 01/09/16  4:35 PM  Result Value Ref Range   Glucose-Capillary 93 65 - 99 mg/dL  Glucose, capillary     Status: Abnormal   Collection Time: 01/09/16  8:32 PM  Result Value Ref Range   Glucose-Capillary 114 (H) 65 - 99 mg/dL   Comment 1 Notify RN   Glucose, capillary     Status: Abnormal   Collection Time: 01/10/16  6:47 AM  Result Value Ref Range   Glucose-Capillary 102 (H) 65 - 99 mg/dL   Comment 1 Notify RN   Protime-INR     Status: Abnormal   Collection Time: 01/10/16  7:21 AM  Result Value Ref Range   Prothrombin Time 26.6 (H) 11.4 - 15.2 seconds   INR 2.40   Glucose, capillary     Status: None   Collection Time: 01/10/16  5:02 PM  Result Value Ref Range   Glucose-Capillary 94 65 - 99 mg/dL    Blood Alcohol level:  Lab Results  Component Value Date   ETH <5 [id number]    Metabolic Disorder Labs: Lab Results  Component Value Date   HGBA1C 7.4 (H) 01/02/2016   MPG 192 12/04/2015   MPG 318 (H) 08/24/2010   Lab Results  Component Value Date   PROLACTIN 4.4 12/11/2015   Lab Results  Component Value Date   CHOL 143 12/11/2015   TRIG 286 (H) 12/11/2015   HDL 32 (L) 12/11/2015   CHOLHDL 4.5 12/11/2015   VLDL 57 (H) 12/11/2015   LDLCALC 54 12/11/2015   LDLCALC 56 12/05/2015    Physical Findings: AIMS: Facial and Oral Movements Muscles of Facial Expression: None, normal Lips and Perioral Area: None, normal Jaw: None, normal Tongue: None, normal,Extremity Movements Upper (arms, wrists, hands, fingers): None, normal Lower (legs, knees, ankles, toes): None, normal, Trunk Movements Neck, shoulders, hips: None, normal, Overall Severity Severity of abnormal movements (highest score from questions above): None, normal Incapacitation due to abnormal movements: None, normal Patient's awareness of abnormal movements (rate only patient's report): No  Awareness, Dental Status Current problems with teeth and/or dentures?: No Does patient usually wear dentures?: No  CIWA:    COWS:     Musculoskeletal: Strength & Muscle Tone: within normal limits Gait & Station: normal Patient leans: N/A  Psychiatric Specialty Exam: Physical Exam  Nursing note and vitals reviewed. Constitutional: He appears well-developed and well-nourished.  HENT:  Head: Normocephalic and atraumatic.  Eyes: Conjunctivae are normal. Pupils are equal, round, and reactive to light.  Neck: Normal range of motion.  Cardiovascular: Normal heart sounds.   Respiratory: Effort normal. No respiratory distress.  GI: Soft.  Musculoskeletal: Normal range of motion.  Neurological: He is alert.  Skin: Skin is warm and dry.     Psychiatric: His affect is labile and inappropriate. His speech is tangential. He is hyperactive. Thought content is delusional. He expresses impulsivity. He expresses no homicidal and no suicidal ideation. He exhibits abnormal recent memory. He is inattentive.    Review of Systems  Constitutional: Negative.  Negative for chills, fever, malaise/fatigue and weight loss.  HENT: Negative.  Negative for hearing loss, nosebleeds, sore throat and tinnitus.   Eyes: Negative.  Negative for blurred vision and double vision.  Respiratory: Negative.  Negative for cough, hemoptysis, sputum production, shortness of breath and wheezing.   Cardiovascular: Negative.  Negative for chest pain, palpitations and claudication.  Gastrointestinal: Negative.  Negative for abdominal pain, blood in stool, constipation, diarrhea, heartburn, nausea and vomiting.  Genitourinary:  Negative for dysuria, frequency and urgency.  Musculoskeletal: Negative.  Negative for back pain, falls, joint pain, myalgias and neck pain.  Skin: Negative.  Negative for itching and rash.  Neurological: Negative.  Negative for dizziness, tingling, tremors, focal weakness, seizures and headaches.    Endo/Heme/Allergies: Negative for environmental allergies. Does not bruise/bleed easily.  Psychiatric/Behavioral: Negative for depression, hallucinations, memory loss, substance abuse and suicidal ideas. The patient is not nervous/anxious and does not have insomnia.     Blood pressure 131/65, pulse 86, temperature 98.9 F (37.2 C), resp. rate (!) 24, height 6' (1.829 m), weight 131.1 kg (289 lb), SpO2 99 %.Body mass index is 39.2 kg/m.  General Appearance: Disheveled  Eye Contact:  Fair  Speech:  Pressured  Volume:  Increased  Mood:  Euphoric  Affect:  Labile  Thought Process:  Disorganized  Orientation:  Full (Time, Place, and Person)  Thought Content:  Illogical, Delusions, Rumination and Tangential  Suicidal Thoughts:  No  Homicidal Thoughts:  No  Memory:  Immediate;   Fair Recent;   Poor Remote;   Fair  Judgement:  Impaired  Insight:  Shallow  Psychomotor Activity:  Increased  Concentration:  Concentration: Poor  Recall:  [place] of Knowledge:  Good  Language:  Good  Akathisia:  No  Handed:  Right  AIMS (if indicated):     Assets:  Financial Resources/Insurance Housing Social Support  ADL's:  Impaired  Cognition:  Impaired,  Mild  Sleep:  Number of Hours: 6     Treatment Plan Summary:  Mr. Maguire 50 year old married Caucasian male with a history of bipolar disorder who was admitted to the hospital for ECT treatment for mania. He was just transferred down to the medicine floor after treatment for cellulitis of the leg.  Bipolar disorder: Most recent episode manic: The patient is currently receiving ECT treatment which has been helpful for him in the past. He is on a number of different psychotropic medications at very high dosages but mania persists. He will continue on Tegretol 1200 mg by mouth twice a day, Lamictal 200 mg by mouth daily and Seroquel 1200 mg by mouth nightly. Patient also has Restoril 15 mg by mouth nightly for insomnia. Will have to monitor  Tegretol level and liver enzymes. We'll have to monitor hemoglobin A1c, lipid panel and prolactin level as well. He is currently receiving ECT 3 times a week and so far denies any side effects from the ECT. He has when necessary Haldol IM and Ativan IM at pretty high doses as that appears to be effective for him. He remains disorganized and manic.  Hyperlipidemia: We'll plan to continue Lipitor 40 mg by mouth daily and fenofibrate 160 mg by mouth daily  Cellulitis of the leg: Will continue Augmentin 1 tablet by mouth twice a day. Cellulitis is slowly improving.  History of pulmonary embolism: We'll continue Coumadin 8 mg by mouth nightly.  Disposition: The patient will be discharged home with his wife because he does have a stable living situation. Psychotropic medication management follow-up appointment as well as individual therapy will be scheduled with Dr. Thurmond Butts. Clinically the patient is still manic with disorganized thinking and some degree of agitation. His affect seems to be less intense and he has not been hostile. He is a little bit more able to express himself rationally although still it is a struggle. Reasonably he has been expressing some concerns about the length of stay and insurance. I have promised him we are taking that  into account but as of now I feel like he is still not appropriate for discharge. We had initially talked about 8 treatments but I'm hoping that we can continue as the patient is not having any sign of delirium or side effect from the ECT and does seem to be getting better. Medically his leg is clearly feeling up. INR has stayed in a reasonably elevated range. No inappropriate bleeding. Blood sugars are staying under good control. Alethia Berthold, MD 01/10/2016, 8:03 PM

## 2016-01-11 LAB — CBC
HEMATOCRIT: 34 % — AB (ref 40.0–52.0)
HEMOGLOBIN: 11.7 g/dL — AB (ref 13.0–18.0)
MCH: 29.3 pg (ref 26.0–34.0)
MCHC: 34.3 g/dL (ref 32.0–36.0)
MCV: 85.4 fL (ref 80.0–100.0)
Platelets: 234 10*3/uL (ref 150–440)
RBC: 3.98 MIL/uL — AB (ref 4.40–5.90)
RDW: 13.3 % (ref 11.5–14.5)
WBC: 2.6 10*3/uL — AB (ref 3.8–10.6)

## 2016-01-11 LAB — PROTIME-INR
INR: 1.93
Prothrombin Time: 22.3 seconds — ABNORMAL HIGH (ref 11.4–15.2)

## 2016-01-11 LAB — GLUCOSE, CAPILLARY
Glucose-Capillary: 87 mg/dL (ref 65–99)
Glucose-Capillary: 95 mg/dL (ref 65–99)
Glucose-Capillary: 101 mg/dL — ABNORMAL HIGH (ref 65–99)
Glucose-Capillary: 129 mg/dL — ABNORMAL HIGH (ref 65–99)
Glucose-Capillary: 123 mg/dL — ABNORMAL HIGH (ref 65–99)

## 2016-01-11 MED ORDER — WARFARIN SODIUM 4 MG PO TABS
7.0000 mg | ORAL_TABLET | Freq: Every day | ORAL | Status: DC
  Administered 2016-01-11 – 2016-01-12 (×2): 7 mg via ORAL
  Filled 2016-01-11 (×2): qty 1

## 2016-01-11 NOTE — Progress Notes (Signed)
D: Patient is alert and disoriented on the unit this shift. Patient attended and  participated in groups today. Patient denies suicidal ideation, homicidal ideation, auditory or visual hallucinations at the present time.  A: Scheduled medications are administered to patient as per MD orders. Emotional support and encouragement are provided. Patient is maintained on q.15 minute safety checks. Patient is informed to notify staff with questions or concerns. R: No adverse medication reactions are noted. Patient is cooperative with medication administration and treatment plan today. Patient is nonreceptive, fidigity and cooperative on the unit at this time. Patient interacts with others on the unit this shift. Patient contracts for safety at this time. Patient remains safe at this time.

## 2016-01-11 NOTE — Progress Notes (Signed)
Inpatient Diabetes Program Recommendations  AACE/ADA: New Consensus Statement on Inpatient Glycemic Control (2015)  Target Ranges:  Prepandial:   less than 140 mg/dL      Peak postprandial:   less than 180 mg/dL (1-2 hours)      Critically ill patients:  140 - 180 mg/dL   Lab Results  Component Value Date   GLUCAP 101 (H) 01/11/2016   HGBA1C 7.4 (H) 01/02/2016    Review of Glycemic Control   Results for ASUNCION, GUIDER (MRN [id number]) as of 01/11/2016 14:02  Ref. Range 01/09/2016 20:32 01/10/2016 06:47 01/10/2016 11:32 01/10/2016 17:02 01/10/2016 20:10 01/11/2016 06:02 01/11/2016 12:39  Glucose-Capillary Latest Ref Range: 65 - 99 mg/dL 114 (H) 102 (H) 87 94 113 (H) 95 101 (H)    Home DM Meds: Lantus 60 units day, Novolog SSI  Current Insulin Orders: Lantus 60 units QHS  Agree with current orders for blood sugar management  Gentry Fitz, RN, [place], [place], CDE Diabetes Coordinator Inpatient Diabetes Program  [phone] (Team Pager) [phone] ([place]) 01/11/2016 2:03 PM

## 2016-01-11 NOTE — [facility] Group Notes (Signed)
[place] LCSW Group Therapy  01/11/2016 4:41 PM  Type of Therapy:  Group Therapy  Participation Level:  Active  Participation Quality:  Sharing  Affect:  Excited and Not Congruent  Cognitive:  Disorganized and Lacking  Insight:  Limited and Poor  Engagement in Therapy:  Distracting  Modes of Intervention:  Discussion, Education and Support  Summary of Progress/Problems: Balance in life: Patients will discuss the concept of balance and how it looks and feels to be unbalanced. Pt will identify areas in their life that is unbalanced and ways to become more balanced. Pt was sharing in group but had no insight to the group discussion. Pt had significant difficulty saying on topic. Pt was redirectable but would get off topic again immediately.    Hodan Wurtz G. [place], [place] 01/11/2016, 4:45 PM

## 2016-01-11 NOTE — Progress Notes (Signed)
Pt with sitter CTownsendRN

## 2016-01-11 NOTE — Progress Notes (Signed)
Pt with Programmer, multimedia

## 2016-01-11 NOTE — Progress Notes (Signed)
Patient in the day room.talking to staff.

## 2016-01-11 NOTE — Progress Notes (Signed)
Recreation Therapy Notes  Date: 08.10.17 Time: 9:30 am Location: Craft Room  Group Topic: Leisure Education  Goal Area(s) Addresses:  Patient will identify things they are grateful for. Patient will verbalize why it is important to be grateful.  Behavioral Response: Attentive, Interactive, Off topic  Intervention: Grateful Wheel  Activity: Patients were given an I Am Grateful For worksheet and instructed to write things they were grateful for under each category.  Education: LRT educated patients on how being grateful can be a positive change in their lives.  Education Outcome: In group clarification offered  Clinical Observations/Feedback: Patient circled his worksheet and wrote on it. Patient was talking to peer who was scared. Patient contributed to group discussion but was off topic talking about [place].  [name], LRT/CTRS 01/11/2016 10:39 AM

## 2016-01-11 NOTE — Progress Notes (Signed)
Patient continues to be hyper verbal but less intrusive.!1:1 sitter maintained.

## 2016-01-11 NOTE — Progress Notes (Signed)
[facility] MD Progress Note  01/11/2016 6:19 PM DUGLAS DADO  MRN:  [id number] Subjective:    Follow-up for Thursday the 10th. Patient seen in treatment team. Patient clearly feels like he is doing better. I acknowledge that this is true but he also has a long way to go. Thoughts are still very disjointed with lots of flight of ideas. He is not however aggressive or violent. Doesn't have any new physical complaints. Principal Problem: Bipolar I disorder, current or most recent episode manic, with psychotic features ([place]) Diagnosis:   Patient Active Problem List   Diagnosis Date Noted  . Bipolar affective disorder, current episode manic ([place]) [F31.9] 01/04/2016  . Cellulitis of leg, right [L03.115] 01/01/2016  . Bipolar I disorder, current or most recent episode manic, with psychotic features ([place]) [F31.2] 12/11/2015  . Pulmonary embolism ([place]) [I26.99] 12/04/2015  . Diabetes mellitus without complication ([place]) [id number]   . Hypercholesteremia [E78.00]   . CKD (chronic kidney disease), stage III [N18.3]   . Acute deep vein thrombosis (DVT) of femoral vein of right lower extremity (HCC) [I82.411]    Total Time spent with patient: 20 minutes  Past Psychiatric History: Long history of bipolar disorder multiple manic episodes. History of response to ECT.  Past Medical History:  Past Medical History:  Diagnosis Date  . Bipolar 1 disorder ([place])   . CKD (chronic kidney disease), stage III   . Diabetes mellitus without complication ([place])   . DVT (deep venous thrombosis) ([place])   . Hypercholesteremia   . Hypertension     Past Surgical History:  Procedure Laterality Date  . APPENDECTOMY     Family History:  Family History  Problem Relation Age of Onset  . Stroke Other   . Diabetes Other    Family Psychiatric  History: Positive for bipolar disorder Social History:  History  Alcohol Use  . Yes    Comment: rarely     History  Drug Use No    Social History   Social History  .  Marital status: Married    Spouse name: N/A  . Number of children: N/A  . Years of education: N/A   Social History Main Topics  . Smoking status: Never Smoker  . Smokeless tobacco: Never Used  . Alcohol use Yes     Comment: rarely  . Drug use: No  . Sexual activity: Not Asked   Other Topics Concern  . None   Social History Narrative  . None   Additional Social History:                         Sleep: Fair  Appetite:  Fair  Current Medications: Current Facility-Administered Medications  Medication Dose Route Frequency Provider Last Rate Last Dose  . acetaminophen (TYLENOL) tablet 650 mg  650 mg Oral Q6H PRN Gonzella Lex, MD   650 mg at 01/11/16 0559  . alum & mag hydroxide-simeth (MAALOX/MYLANTA) 200-200-20 MG/5ML suspension 30 mL  30 mL Oral Q4H PRN Gonzella Lex, MD      . atorvastatin (LIPITOR) tablet 40 mg  40 mg Oral q1800 Gonzella Lex, MD   40 mg at 01/11/16 1801  . carbamazepine (TEGRETOL) tablet 1,200 mg  1,200 mg Oral BID PC Gonzella Lex, MD   1,200 mg at 01/11/16 1800  . dextrose 5 % solution 250 mL  250 mL Intravenous Once Gonzella Lex, MD      . enalapril (VASOTEC) tablet 5  mg  5 mg Oral BID Gonzella Lex, MD   5 mg at 01/11/16 0906  . fenofibrate tablet 160 mg  160 mg Oral Daily Gonzella Lex, MD   160 mg at 01/11/16 0906  . haloperidol lactate (HALDOL) injection 20 mg  20 mg Intramuscular Q6H PRN Gonzella Lex, MD      . insulin glargine (LANTUS) injection 60 Units  60 Units Subcutaneous QHS Gonzella Lex, MD   60 Units at 01/09/16 2125  . lamoTRIgine (LAMICTAL) tablet 200 mg  200 mg Oral Daily Gonzella Lex, MD   200 mg at 01/11/16 0906  . LORazepam (ATIVAN) injection 2 mg  2 mg Intramuscular Q6H PRN Gonzella Lex, MD      . magnesium hydroxide (MILK OF MAGNESIA) suspension 30 mL  30 mL Oral Daily PRN Gonzella Lex, MD      . QUEtiapine (SEROQUEL) tablet 1,200 mg  1,200 mg Oral QHS Gonzella Lex, MD   1,200 mg at 01/10/16 2145  .  temazepam (RESTORIL) capsule 15 mg  15 mg Oral QHS Gonzella Lex, MD   15 mg at 01/10/16 2145  . warfarin (COUMADIN) tablet 7 mg  7 mg Oral q1800 Gonzella Lex, MD   7 mg at 01/11/16 1801  . Warfarin - Pharmacist Dosing Inpatient   Does not apply q1800 Gonzella Lex, MD        Lab Results:  Results for orders placed or performed during the hospital encounter of 01/04/16 (from the past 48 hour(s))  Glucose, capillary     Status: Abnormal   Collection Time: 01/09/16  8:32 PM  Result Value Ref Range   Glucose-Capillary 114 (H) 65 - 99 mg/dL   Comment 1 Notify RN   Glucose, capillary     Status: Abnormal   Collection Time: 01/10/16  6:47 AM  Result Value Ref Range   Glucose-Capillary 102 (H) 65 - 99 mg/dL   Comment 1 Notify RN   Protime-INR     Status: Abnormal   Collection Time: 01/10/16  7:21 AM  Result Value Ref Range   Prothrombin Time 26.6 (H) 11.4 - 15.2 seconds   INR 2.40   Glucose, capillary     Status: None   Collection Time: 01/10/16 11:32 AM  Result Value Ref Range   Glucose-Capillary 87 65 - 99 mg/dL   Comment 1 Notify RN   Glucose, capillary     Status: None   Collection Time: 01/10/16  5:02 PM  Result Value Ref Range   Glucose-Capillary 94 65 - 99 mg/dL  Glucose, capillary     Status: Abnormal   Collection Time: 01/10/16  8:10 PM  Result Value Ref Range   Glucose-Capillary 113 (H) 65 - 99 mg/dL  Glucose, capillary     Status: None   Collection Time: 01/11/16  6:02 AM  Result Value Ref Range   Glucose-Capillary 95 65 - 99 mg/dL  Protime-INR     Status: Abnormal   Collection Time: 01/11/16  6:37 AM  Result Value Ref Range   Prothrombin Time 22.3 (H) 11.4 - 15.2 seconds   INR 1.93   CBC     Status: Abnormal   Collection Time: 01/11/16  6:37 AM  Result Value Ref Range   WBC 2.6 (L) 3.8 - 10.6 K/uL   RBC 3.98 (L) 4.40 - 5.90 MIL/uL   Hemoglobin 11.7 (L) 13.0 - 18.0 g/dL   HCT 34.0 (L) 40.0 - 52.0 %  MCV 85.4 80.0 - 100.0 fL   MCH 29.3 26.0 - 34.0 pg    MCHC 34.3 32.0 - 36.0 g/dL   RDW 13.3 11.5 - 14.5 %   Platelets 234 150 - 440 K/uL  Glucose, capillary     Status: Abnormal   Collection Time: 01/11/16 12:39 PM  Result Value Ref Range   Glucose-Capillary 101 (H) 65 - 99 mg/dL  Glucose, capillary     Status: Abnormal   Collection Time: 01/11/16  4:46 PM  Result Value Ref Range   Glucose-Capillary 129 (H) 65 - 99 mg/dL   Comment 1 Notify RN     Blood Alcohol level:  Lab Results  Component Value Date   ETH <5 [id number]    Metabolic Disorder Labs: Lab Results  Component Value Date   HGBA1C 7.4 (H) 01/02/2016   MPG 192 12/04/2015   MPG 318 (H) 08/24/2010   Lab Results  Component Value Date   PROLACTIN 4.4 12/11/2015   Lab Results  Component Value Date   CHOL 143 12/11/2015   TRIG 286 (H) 12/11/2015   HDL 32 (L) 12/11/2015   CHOLHDL 4.5 12/11/2015   VLDL 57 (H) 12/11/2015   LDLCALC 54 12/11/2015   LDLCALC 56 12/05/2015    Physical Findings: AIMS: Facial and Oral Movements Muscles of Facial Expression: None, normal Lips and Perioral Area: None, normal Jaw: None, normal Tongue: None, normal,Extremity Movements Upper (arms, wrists, hands, fingers): None, normal Lower (legs, knees, ankles, toes): None, normal, Trunk Movements Neck, shoulders, hips: None, normal, Overall Severity Severity of abnormal movements (highest score from questions above): None, normal Incapacitation due to abnormal movements: None, normal Patient's awareness of abnormal movements (rate only patient's report): No Awareness, Dental Status Current problems with teeth and/or dentures?: No Does patient usually wear dentures?: No  CIWA:    COWS:     Musculoskeletal: Strength & Muscle Tone: within normal limits Gait & Station: normal Patient leans: N/A  Psychiatric Specialty Exam: Physical Exam  Nursing note and vitals reviewed. Constitutional: He appears well-developed and well-nourished.  HENT:  Head: Normocephalic and atraumatic.    Eyes: Conjunctivae are normal. Pupils are equal, round, and reactive to light.  Neck: Normal range of motion.  Cardiovascular: Normal heart sounds.   Respiratory: Effort normal. No respiratory distress.  GI: Soft.  Musculoskeletal: Normal range of motion.  Neurological: He is alert.  Skin: Skin is warm and dry.     Psychiatric: His affect is labile and inappropriate. His speech is tangential. He is hyperactive. Thought content is delusional. He expresses impulsivity. He expresses no homicidal and no suicidal ideation. He exhibits abnormal recent memory. He is inattentive.    Review of Systems  Constitutional: Negative.  Negative for chills, fever, malaise/fatigue and weight loss.  HENT: Negative.  Negative for hearing loss, nosebleeds, sore throat and tinnitus.   Eyes: Negative.  Negative for blurred vision and double vision.  Respiratory: Negative.  Negative for cough, hemoptysis, sputum production, shortness of breath and wheezing.   Cardiovascular: Negative.  Negative for chest pain, palpitations and claudication.  Gastrointestinal: Negative.  Negative for abdominal pain, blood in stool, constipation, diarrhea, heartburn, nausea and vomiting.  Genitourinary: Negative for dysuria, frequency and urgency.  Musculoskeletal: Negative.  Negative for back pain, falls, joint pain, myalgias and neck pain.  Skin: Negative.  Negative for itching and rash.  Neurological: Negative.  Negative for dizziness, tingling, tremors, focal weakness, seizures and headaches.  Endo/Heme/Allergies: Negative for environmental allergies. Does not bruise/bleed easily.  Psychiatric/Behavioral:  Negative for depression, hallucinations, memory loss, substance abuse and suicidal ideas. The patient is not nervous/anxious and does not have insomnia.     Blood pressure 119/73, pulse 84, temperature 98 F (36.7 C), temperature source Oral, resp. rate 20, height 6' (1.829 m), weight 131.1 kg (289 lb), SpO2 99 %.Body mass  index is 39.2 kg/m.  General Appearance: Disheveled  Eye Contact:  Fair  Speech:  Pressured  Volume:  Increased  Mood:  Euphoric  Affect:  Labile  Thought Process:  Disorganized  Orientation:  Full (Time, Place, and Person)  Thought Content:  Illogical, Delusions, Rumination and Tangential  Suicidal Thoughts:  No  Homicidal Thoughts:  No  Memory:  Immediate;   Fair Recent;   Poor Remote;   Fair  Judgement:  Impaired  Insight:  Shallow  Psychomotor Activity:  Increased  Concentration:  Concentration: Poor  Recall:  [place] of Knowledge:  Good  Language:  Good  Akathisia:  No  Handed:  Right  AIMS (if indicated):     Assets:  Financial Resources/Insurance Housing Social Support  ADL's:  Impaired  Cognition:  Impaired,  Mild  Sleep:  Number of Hours: 5.15     Treatment Plan Summary:  Mr. Pepito 50 year old married Caucasian male with a history of bipolar disorder who was admitted to the hospital for ECT treatment for mania. He was just transferred down to the medicine floor after treatment for cellulitis of the leg.  Bipolar disorder: Most recent episode manic: The patient is currently receiving ECT treatment which has been helpful for him in the past. He is on a number of different psychotropic medications at very high dosages but mania persists. He will continue on Tegretol 1200 mg by mouth twice a day, Lamictal 200 mg by mouth daily and Seroquel 1200 mg by mouth nightly. Patient also has Restoril 15 mg by mouth nightly for insomnia. Will have to monitor Tegretol level and liver enzymes. We'll have to monitor hemoglobin A1c, lipid panel and prolactin level as well. He is currently receiving ECT 3 times a week and so far denies any side effects from the ECT. He has when necessary Haldol IM and Ativan IM at pretty high doses as that appears to be effective for him. He remains disorganized and manic.  Hyperlipidemia: We'll plan to continue Lipitor 40 mg by mouth daily and  fenofibrate 160 mg by mouth daily  Cellulitis of the leg: Will continue Augmentin 1 tablet by mouth twice a day. Cellulitis is slowly improving.  History of pulmonary embolism: We'll continue Coumadin 8 mg by mouth nightly.  Disposition: The patient will be discharged home with his wife because he does have a stable living situation. Psychotropic medication management follow-up appointment as well as individual therapy will be scheduled with Dr. Thurmond Butts. Patient is about the same as yesterday may be a little better. He is physically calm are less agitated emotionally calm her but still with lots of disorganized thinking and flight of ideas. He has some perception of it but his insight is not complete. I would like to continue ECT since he has tolerated it and we are keeping him on the schedule for tomorrow. I have left a message for his wife to try and get in touch with me so we can discuss her per her perception of the situation and the possibility of discharge although I definitely am not trying to push for early discharge. Left message for Dr. Thurmond Butts today as well. Alethia Berthold, MD 01/11/2016,  6:19 PM

## 2016-01-11 NOTE — BHH Group Notes (Signed)
[facility] Group Notes:  (Nursing/MHT/Case Management/Adjunct)  Date:  01/11/2016  Time:  3:55 AM  Type of Therapy:  Group Therapy  Participation Level:  Active  Participation Quality:  Redirectable  Affect:  Excited  Cognitive:  Disorganized  Insight:  Lacking  Engagement in Group:  Distracting and Off Topic  Modes of Intervention:  Limit-setting  Summary of Progress/Problems: Group was held outside. Pt was very talkative and as his fellow pt's played basketball. Pt would walk across the basketball court. Interrupting the other pt's game. Pt would apologize, but repeat action in attempts to have conversation with those who were playing. Staff was able to redirect him.   Jenetta Downer Justyne Roell 01/11/2016, 3:55 AM

## 2016-01-11 NOTE — Plan of Care (Signed)
Problem: Coping: Goal: Ability to verbalize feelings will improve Outcome: Not Progressing Pt unable to verbalize feelings at this time due to disorganized thinking [place] RN

## 2016-01-11 NOTE — Progress Notes (Signed)
ANTICOAGULATION CONSULT NOTE - Follow Up Consult  Pharmacy Consult for Warfarin Indication: VTE treatment  Allergies  Allergen Reactions  . Asa [Aspirin] Other (See Comments)    Patient tolerates LOW DOSE ASPIRIN.    Patient Measurements: Height: 6' (182.9 cm) Weight: 289 lb (131.1 kg) IBW/kg (Calculated) : 77.6  Vital Signs: Temp: 98 F (36.7 C) (08/10 0711) Temp Source: Oral (08/10 0711) BP: 119/73 (08/10 0711) Pulse Rate: 84 (08/10 0711)  Labs:  Recent Labs  01/09/16 0617 01/10/16 0721 01/11/16 0637  HGB  --   --  11.7*  HCT  --   --  34.0*  PLT  --   --  234  LABPROT 38.8* 26.6* 22.3*  INR 3.85 2.40 1.93   Estimated Creatinine Clearance: 76.5 mL/min (by C-G formula based on SCr of 1.6 mg/dL).  Medical History: Past Medical History:  Diagnosis Date  . Bipolar 1 disorder ([place])   . CKD (chronic kidney disease), stage III   . Diabetes mellitus without complication ([place])   . DVT (deep venous thrombosis) ([place])   . Hypercholesteremia   . Hypertension    Medications:  Warfarin   Assessment: 52 yom admitted to ED [facility] with manic symptoms. Recently discharged from [facility] with DVT/PE, had been bridging LMWH and VKA prior to admission.  INR History: 7/9: INR - 1.99 7/10: no INR- warfarin 12.5 mg po daily at 0200 7/11: INR - 2.97- warfarin held 7/12: INR - 1.92 - warfarin 10 mg 7/13: INR - 1.95 - warfarin 10 mg 7/14: INR - 2.56 - warfarin 5 mg 7/15: INR - 2.73 - warfarin 5 mg 7/16: INR - 2.39 - warfarin 7.5 mg 7/17: INR - 2.22 - warfarin 7.5 mg 7/18: INR - 2.53 - warfarin 7.5 mg 7/19: INR - 2.75 - warfarin 7.5 mg 7/20: INR - 2.99 - warfarin 6.5 mg 7/21: INR - 2.64 - warfarin 6.5 mg  7/22: INR: 2.44; warfarin 8  7/23: INR: 2.60;  Warfarin 10mg  7/24: INR 2.44 - warfarin 10mg  7/25:  INR 3.22 - warfarin 10mg  7/26:  INR 3.08 - warfarin 8mg  7/27:  INR 3.24 - warfarin 8 mg 7/28:  INR 2.79 - 8 mg 7/29:  INR 2.74- 8 mg 7/30:  INR 3.19 - 7 mg 7/31:  INR 2.73   7.5mg  8/1:    INR 3.13  7.5mg  8/2:    INR 2.74  7.5mg  8/3     INR 2.27  7.5mg  8/4     INR 3.19  8 mg 8/5     INR 3.04  7.5 mg 8/6     INR 3.05  7.5mg  8/7:    INR  3.22  6mg  8/8:    INR  3.85  Dose held per protocol 8/9:    INR 2.4     6mg  8/10:  INR 1.93     Goal of Therapy:  INR 2-3 Monitor platelets by anticoagulation protocol: Yes   Plan:  INR is sub-therapeutic today, increase warfarin to 7 mg and Follow up INR in the AM.  (Patient with orders for carbamazepine which interacts with warfarin to decrease the INR. Patient has orders for fenofibrate which interacts with warfarin to increase the INR. Will follow INR closely.)   Pt will need CBC q3 days per policy.   Pharmacy will continue to follow.   Loree Fee, [facility] Clinical Pharmacist 01/11/2016,8:20 AM

## 2016-01-11 NOTE — Progress Notes (Signed)
Patient appropriate behavior with 1:1. 

## 2016-01-11 NOTE — BHH Group Notes (Signed)
Goals Group  Date/Time: 01/11/16 9:00AM Type of Therapy and Topic: Group Therapy: Goals Group: SMART Goals  ?  Participation Level: Moderate  ?  Description of Group:  ?  The purpose of a daily goals group is to assist and guide patients in setting recovery/wellness-related goals. The objective is to set goals as they relate to the crisis in which they were admitted. Patients will be using SMART goal modalities to set measurable goals. Characteristics of realistic goals will be discussed and patients will be assisted in setting and processing how one will reach their goal. Facilitator will also assist patients in applying interventions and coping skills learned in psycho-education groups to the SMART goal and process how one will achieve defined goal.  ?  Therapeutic Goals:  ?  -Patients will develop and document one goal related to or their crisis in which brought them into treatment.  -Patients will be guided by LCSW using SMART goal setting modality in how to set a measurable, attainable, realistic and time sensitive goal.  -Patients will process barriers in reaching goal.  -Patients will process interventions in how to overcome and successful in reaching goal.  ?  Patient's Goal: Pt's goal was to discharge to be home with family. Pt is determined to take medications and participate in groups in order to accomplish goal. ?  Therapeutic Modalities:  Motivational Interviewing  Public relations account executive Therapy  Crisis Intervention Model SMART goals setting

## 2016-01-11 NOTE — Progress Notes (Signed)
Called Dr.Clapacs and sitter D/C'd.Patient is redirectable and maintained safety.

## 2016-01-11 NOTE — Progress Notes (Signed)
Patient in day room having breakfast with 1:1 sitter.Compliants with medicines.

## 2016-01-11 NOTE — Progress Notes (Signed)
Appetite & energy level good.continues to be hyperverbal.Safety maintained with 1:1 sitter.

## 2016-01-11 NOTE — Progress Notes (Signed)
Patient attending group.

## 2016-01-12 ENCOUNTER — Other Ambulatory Visit: Payer: Self-pay | Admitting: Psychiatry

## 2016-01-12 ENCOUNTER — Encounter: Payer: Self-pay | Admitting: Anesthesiology

## 2016-01-12 LAB — GLUCOSE, CAPILLARY
GLUCOSE-CAPILLARY: 71 mg/dL (ref 65–99)
GLUCOSE-CAPILLARY: 70 mg/dL (ref 65–99)
Glucose-Capillary: 102 mg/dL — ABNORMAL HIGH (ref 65–99)
Glucose-Capillary: 98 mg/dL (ref 65–99)

## 2016-01-12 LAB — PROTIME-INR
INR: 2
PROTHROMBIN TIME: 23 s — AB (ref 11.4–15.2)

## 2016-01-12 MED ORDER — LORAZEPAM 1 MG PO TABS
1.0000 mg | ORAL_TABLET | ORAL | Status: DC | PRN
  Administered 2016-01-12 – 2016-01-17 (×3): 1 mg via ORAL
  Filled 2016-01-12 (×3): qty 1

## 2016-01-12 MED ORDER — QUETIAPINE FUMARATE 200 MG PO TABS
1400.0000 mg | ORAL_TABLET | Freq: Every day | ORAL | Status: DC
Start: 2016-01-12 — End: 2016-02-01
  Administered 2016-01-12 – 2016-01-31 (×20): 1400 mg via ORAL
  Filled 2016-01-12 (×22): qty 7

## 2016-01-12 NOTE — BHH Group Notes (Signed)
[place] LCSW Group Therapy  01/12/2016 2:57 PM  Type of Therapy:  Group Therapy  Participation Level:  Active  Participation Quality:  Intrusive  Affect:  Excited and Not Congruent  Cognitive:  Disorganized  Insight:  Lacking  Engagement in Therapy:  Engaged  Modes of Intervention:  Discussion, Education and Support  Summary of Progress/Problems: Feelings around Relapse. Group members discussed the meaning of relapse and shared personal stories of relapse, how it affected them and others, and how they perceived themselves during this time. Group members were encouraged to identify triggers, warning signs and coping skills used when facing the possibility of relapse. Social supports were discussed and explored in detail. Pt was active during group but made inappropriate comments unrelated to the group discussion. Pt needed constant redirection but was unable to refrain from random outbursts.   Tomika Eckles G. [place], [place] 01/12/2016, 3:00 PM

## 2016-01-12 NOTE — Plan of Care (Signed)
Problem: Self-Care: Goal: Ability to participate in self-care as condition permits will improve Outcome: Not Progressing Patient could not keep himself NPO for ECT.

## 2016-01-12 NOTE — BHH Group Notes (Signed)
[facility] Group Notes:  (Nursing/MHT/Case Management/Adjunct)  Date:  01/12/2016  Time:  3:54 PM  Type of Therapy:  Psychoeducational Skills  Participation Level:  Active  Participation Quality:  Intrusive, Inattentive and Needed continuous redirection.   Affect:  Excited and Not Congruent  Cognitive:  Disorganized  Insight:  Lacking and Limited  Engagement in Group:  Distracting and Off Topic  Modes of Intervention:  Discussion, Education and Support  Summary of Progress/Problems:  Cameron [name] [facility] 01/12/2016, 3:54 PM

## 2016-01-12 NOTE — Progress Notes (Signed)
Nurse from [facility] stated the patient ate 2 crackers. Anesthesia notified and stated the patient would have to wait 6 hours. Dr. Weber Cooks notified and stated the patient would have to be cancelled.

## 2016-01-12 NOTE — Progress Notes (Signed)
D:Patient still very tangential and hyperverbal. He has been up most of the night because he said he woke up in a sweat. He then would not go back to bed until he could take a shower.He denies SI/HI/AVH.When talking about ECT he makes statements such as, "I have rights. I can refuse." He has been visible in the milieu interacting with staff and peers.  A: Medication was given with education. Encouragement was provided. Patient was educated to stay NPO after midnight R: Patient was compliant with medication. Safety maintained with 15 min checks.

## 2016-01-12 NOTE — BHH Group Notes (Signed)
[facility] Group Notes:  (Nursing/MHT/Case Management/Adjunct)  Date:  01/12/2016  Time:  3:55 AM  Type of Therapy:  Psychoeducational Skills  Participation Level:  Did Not Attend  Summary of Progress/Problems:  [name] 01/12/2016, 3:55 AM

## 2016-01-12 NOTE — Progress Notes (Signed)
This morning staff saw patient was eating 2 bites of gram crackers which was hiding in his pocket.ECT got cancelled because not keeping NPO.Patient continues to be hyper verbal with staff & peers but redirectable.Attended groups.Compliant with medications.

## 2016-01-12 NOTE — Plan of Care (Signed)
Problem: Activity: Goal: Risk for activity intolerance will decrease Outcome: Progressing Patient is seen pacing around the unit throughout the evening.

## 2016-01-12 NOTE — Progress Notes (Signed)
ANTICOAGULATION CONSULT NOTE - Follow Up Consult  Pharmacy Consult for Warfarin Indication: VTE treatment  Allergies  Allergen Reactions  . Asa [Aspirin] Other (See Comments)    Patient tolerates LOW DOSE ASPIRIN.    Patient Measurements: Height: 6' (182.9 cm) Weight: 289 lb (131.1 kg) IBW/kg (Calculated) : 77.6  Vital Signs: Temp: 97.7 F (36.5 C) (08/11 0715) Temp Source: Oral (08/11 0715) BP: 123/81 (08/11 0715) Pulse Rate: 75 (08/11 0715)  Labs:  Recent Labs  01/10/16 0721 01/11/16 0637 01/12/16 0630  HGB  --  11.7*  --   HCT  --  34.0*  --   PLT  --  234  --   LABPROT 26.6* 22.3* 23.0*  INR 2.40 1.93 2.00   Estimated Creatinine Clearance: 76.5 mL/min (by C-G formula based on SCr of 1.6 mg/dL).  Medical History: Past Medical History:  Diagnosis Date  . Bipolar 1 disorder ([place])   . CKD (chronic kidney disease), stage III   . Diabetes mellitus without complication ([place])   . DVT (deep venous thrombosis) ([place])   . Hypercholesteremia   . Hypertension    Medications:  Warfarin   Assessment: 36 yom admitted to ED [facility] with manic symptoms. Recently discharged from [facility] with DVT/PE, had been bridging LMWH and VKA prior to admission.  INR History: 7/9: INR - 1.99 7/10: no INR- warfarin 12.5 mg po daily at 0200 7/11: INR - 2.97- warfarin held 7/12: INR - 1.92 - warfarin 10 mg 7/13: INR - 1.95 - warfarin 10 mg 7/14: INR - 2.56 - warfarin 5 mg 7/15: INR - 2.73 - warfarin 5 mg 7/16: INR - 2.39 - warfarin 7.5 mg 7/17: INR - 2.22 - warfarin 7.5 mg 7/18: INR - 2.53 - warfarin 7.5 mg 7/19: INR - 2.75 - warfarin 7.5 mg 7/20: INR - 2.99 - warfarin 6.5 mg 7/21: INR - 2.64 - warfarin 6.5 mg  7/22: INR: 2.44; warfarin 8  7/23: INR: 2.60;  Warfarin 10mg  7/24: INR 2.44 - warfarin 10mg  7/25:  INR 3.22 - warfarin 10mg  7/26:  INR 3.08 - warfarin 8mg  7/27:  INR 3.24 - warfarin 8 mg 7/28:  INR 2.79 - 8 mg 7/29:  INR 2.74- 8 mg 7/30:  INR 3.19 - 7 mg 7/31:  INR 2.73   7.5mg  8/1:    INR 3.13  7.5mg  8/2:    INR 2.74  7.5mg  8/3     INR 2.27  7.5mg  8/4     INR 3.19  8 mg 8/5     INR 3.04  7.5 mg 8/6     INR 3.05  7.5mg  8/7:    INR  3.22  6mg  8/8:    INR  3.85  Dose held per protocol 8/9:    INR 2.4     6mg  8/10:  INR 1.93   7mg   8/11:  INR  2.00    Goal of Therapy:  INR 2-3 Monitor platelets by anticoagulation protocol: Yes   Plan:  INR is therapeutic today, continue warfarin 7 mg and Follow up INR in the AM.  (Patient with orders for carbamazepine which interacts with warfarin to decrease the INR. Patient has orders for fenofibrate which interacts with warfarin to increase the INR. Will follow INR closely.)   Pt will need CBC q3 days per policy.   Pharmacy will continue to follow.   Loree Fee, [facility] Clinical Pharmacist 01/12/2016,9:40 AM

## 2016-01-12 NOTE — Progress Notes (Signed)
[facility] MD Progress Note  01/12/2016 6:50 PM Cameron [name]  MRN:  [id number] Subjective:    Follow-up for Friday the 11th. Patient was scheduled for ECT this morning but he ate some crackers in the morning. I think he knew exactly what he was doing and was acting out to avoid treatment. On interview this evening I find the patient to still be very disorganized and tangential in his thinking. He is not frankly bizarre but he is still unable to get out a single sentence that has a complete thought or to hold a rational conversation. His insight is poor. He does not seem to think that he still has any kind of acute problem. He indicated to me that he did not want to continue ECT.   Principal Problem: Bipolar I disorder, current or most recent episode manic, with psychotic features ([place]) Diagnosis:   Patient Active Problem List   Diagnosis Date Noted  . Bipolar affective disorder, current episode manic ([place]) [F31.9] 01/04/2016  . Cellulitis of leg, right [L03.115] 01/01/2016  . Bipolar I disorder, current or most recent episode manic, with psychotic features ([place]) [F31.2] 12/11/2015  . Pulmonary embolism ([place]) [I26.99] 12/04/2015  . Diabetes mellitus without complication ([place]) [id number]   . Hypercholesteremia [E78.00]   . CKD (chronic kidney disease), stage III [N18.3]   . Acute deep vein thrombosis (DVT) of femoral vein of right lower extremity (HCC) [I82.411]    Total Time spent with patient: 20 minutes  Past Psychiatric History: Long history of bipolar disorder multiple manic episodes. History of response to ECT.  Past Medical History:  Past Medical History:  Diagnosis Date  . Bipolar 1 disorder ([place])   . CKD (chronic kidney disease), stage III   . Diabetes mellitus without complication ([place])   . DVT (deep venous thrombosis) ([place])   . Hypercholesteremia   . Hypertension     Past Surgical History:  Procedure Laterality Date  . APPENDECTOMY     Family History:  Family History    Problem Relation Age of Onset  . Stroke Other   . Diabetes Other    Family Psychiatric  History: Positive for bipolar disorder Social History:  History  Alcohol Use  . Yes    Comment: rarely     History  Drug Use No    Social History   Social History  . Marital status: Married    Spouse name: N/A  . Number of children: N/A  . Years of education: N/A   Social History Main Topics  . Smoking status: Never Smoker  . Smokeless tobacco: Never Used  . Alcohol use Yes     Comment: rarely  . Drug use: No  . Sexual activity: Not Asked   Other Topics Concern  . None   Social History Narrative  . None   Additional Social History:                         Sleep: Fair  Appetite:  Fair  Current Medications: Current Facility-Administered Medications  Medication Dose Route Frequency Provider Last Rate Last Dose  . acetaminophen (TYLENOL) tablet 650 mg  650 mg Oral Q6H PRN Gonzella Lex, MD   650 mg at 01/12/16 1106  . alum & mag hydroxide-simeth (MAALOX/MYLANTA) 200-200-20 MG/5ML suspension 30 mL  30 mL Oral Q4H PRN Gonzella Lex, MD      . atorvastatin (LIPITOR) tablet 40 mg  40 mg Oral q1800 Gonzella Lex, MD  40 mg at 01/12/16 1828  . carbamazepine (TEGRETOL) tablet 1,200 mg  1,200 mg Oral BID PC Gonzella Lex, MD   1,200 mg at 01/12/16 1828  . dextrose 5 % solution 250 mL  250 mL Intravenous Once Gonzella Lex, MD      . enalapril (VASOTEC) tablet 5 mg  5 mg Oral BID Gonzella Lex, MD   5 mg at 01/12/16 0631  . fenofibrate tablet 160 mg  160 mg Oral Daily Gonzella Lex, MD   160 mg at 01/12/16 1103  . haloperidol lactate (HALDOL) injection 20 mg  20 mg Intramuscular Q6H PRN Gonzella Lex, MD      . insulin glargine (LANTUS) injection 60 Units  60 Units Subcutaneous QHS Gonzella Lex, MD   60 Units at 01/11/16 2145  . lamoTRIgine (LAMICTAL) tablet 200 mg  200 mg Oral Daily Gonzella Lex, MD   200 mg at 01/12/16 1103  . LORazepam (ATIVAN) injection 2 mg   2 mg Intramuscular Q6H PRN Gonzella Lex, MD      . LORazepam (ATIVAN) tablet 1 mg  1 mg Oral Q4H PRN Rainey Pines, MD   1 mg at 01/12/16 0440  . magnesium hydroxide (MILK OF MAGNESIA) suspension 30 mL  30 mL Oral Daily PRN Gonzella Lex, MD      . QUEtiapine (SEROQUEL) tablet 1,400 mg  1,400 mg Oral QHS Gonzella Lex, MD      . temazepam (RESTORIL) capsule 15 mg  15 mg Oral QHS Gonzella Lex, MD   15 mg at 01/11/16 2143  . warfarin (COUMADIN) tablet 7 mg  7 mg Oral q1800 Gonzella Lex, MD   7 mg at 01/12/16 1828  . Warfarin - Pharmacist Dosing Inpatient   Does not apply q1800 Gonzella Lex, MD        Lab Results:  Results for orders placed or performed during the hospital encounter of 01/04/16 (from the past 48 hour(s))  Glucose, capillary     Status: Abnormal   Collection Time: 01/10/16  8:10 PM  Result Value Ref Range   Glucose-Capillary 113 (H) 65 - 99 mg/dL  Glucose, capillary     Status: None   Collection Time: 01/11/16  6:02 AM  Result Value Ref Range   Glucose-Capillary 95 65 - 99 mg/dL  Protime-INR     Status: Abnormal   Collection Time: 01/11/16  6:37 AM  Result Value Ref Range   Prothrombin Time 22.3 (H) 11.4 - 15.2 seconds   INR 1.93   CBC     Status: Abnormal   Collection Time: 01/11/16  6:37 AM  Result Value Ref Range   WBC 2.6 (L) 3.8 - 10.6 K/uL   RBC 3.98 (L) 4.40 - 5.90 MIL/uL   Hemoglobin 11.7 (L) 13.0 - 18.0 g/dL   HCT 34.0 (L) 40.0 - 52.0 %   MCV 85.4 80.0 - 100.0 fL   MCH 29.3 26.0 - 34.0 pg   MCHC 34.3 32.0 - 36.0 g/dL   RDW 13.3 11.5 - 14.5 %   Platelets 234 150 - 440 K/uL  Glucose, capillary     Status: Abnormal   Collection Time: 01/11/16 12:39 PM  Result Value Ref Range   Glucose-Capillary 101 (H) 65 - 99 mg/dL  Glucose, capillary     Status: Abnormal   Collection Time: 01/11/16  4:46 PM  Result Value Ref Range   Glucose-Capillary 129 (H) 65 - 99 mg/dL  Comment 1 Notify RN   Glucose, capillary     Status: Abnormal   Collection Time:  01/11/16  8:35 PM  Result Value Ref Range   Glucose-Capillary 123 (H) 65 - 99 mg/dL  Glucose, capillary     Status: None   Collection Time: 01/12/16  6:15 AM  Result Value Ref Range   Glucose-Capillary 71 65 - 99 mg/dL  Protime-INR     Status: Abnormal   Collection Time: 01/12/16  6:30 AM  Result Value Ref Range   Prothrombin Time 23.0 (H) 11.4 - 15.2 seconds   INR 2.00   Glucose, capillary     Status: None   Collection Time: 01/12/16 12:18 PM  Result Value Ref Range   Glucose-Capillary 70 65 - 99 mg/dL   Comment 1 Notify RN   Glucose, capillary     Status: Abnormal   Collection Time: 01/12/16  4:44 PM  Result Value Ref Range   Glucose-Capillary 102 (H) 65 - 99 mg/dL   Comment 1 Notify RN     Blood Alcohol level:  Lab Results  Component Value Date   ETH <5 [id number]    Metabolic Disorder Labs: Lab Results  Component Value Date   HGBA1C 7.4 (H) 01/02/2016   MPG 192 12/04/2015   MPG 318 (H) 08/24/2010   Lab Results  Component Value Date   PROLACTIN 4.4 12/11/2015   Lab Results  Component Value Date   CHOL 143 12/11/2015   TRIG 286 (H) 12/11/2015   HDL 32 (L) 12/11/2015   CHOLHDL 4.5 12/11/2015   VLDL 57 (H) 12/11/2015   LDLCALC 54 12/11/2015   LDLCALC 56 12/05/2015    Physical Findings: AIMS: Facial and Oral Movements Muscles of Facial Expression: None, normal Lips and Perioral Area: None, normal Jaw: None, normal Tongue: None, normal,Extremity Movements Upper (arms, wrists, hands, fingers): None, normal Lower (legs, knees, ankles, toes): None, normal, Trunk Movements Neck, shoulders, hips: None, normal, Overall Severity Severity of abnormal movements (highest score from questions above): None, normal Incapacitation due to abnormal movements: None, normal Patient's awareness of abnormal movements (rate only patient's report): No Awareness, Dental Status Current problems with teeth and/or dentures?: No Does patient usually wear dentures?: No  CIWA:      COWS:     Musculoskeletal: Strength & Muscle Tone: within normal limits Gait & Station: normal Patient leans: N/A  Psychiatric Specialty Exam: Physical Exam  Nursing note and vitals reviewed. Constitutional: He appears well-developed and well-nourished.  HENT:  Head: Normocephalic and atraumatic.  Eyes: Conjunctivae are normal. Pupils are equal, round, and reactive to light.  Neck: Normal range of motion.  Cardiovascular: Normal heart sounds.   Respiratory: Effort normal. No respiratory distress.  GI: Soft.  Musculoskeletal: Normal range of motion.  Neurological: He is alert.  Skin: Skin is warm and dry.     Psychiatric: His affect is labile and inappropriate. His speech is tangential. He is hyperactive. Thought content is delusional. He expresses impulsivity. He expresses no homicidal and no suicidal ideation. He exhibits abnormal recent memory. He is inattentive.    Review of Systems  Constitutional: Negative.  Negative for chills, fever, malaise/fatigue and weight loss.  HENT: Negative.  Negative for hearing loss, nosebleeds, sore throat and tinnitus.   Eyes: Negative.  Negative for blurred vision and double vision.  Respiratory: Negative.  Negative for cough, hemoptysis, sputum production, shortness of breath and wheezing.   Cardiovascular: Negative.  Negative for chest pain, palpitations and claudication.  Gastrointestinal: Negative.  Negative  for abdominal pain, blood in stool, constipation, diarrhea, heartburn, nausea and vomiting.  Genitourinary: Negative for dysuria, frequency and urgency.  Musculoskeletal: Negative.  Negative for back pain, falls, joint pain, myalgias and neck pain.  Skin: Negative.  Negative for itching and rash.  Neurological: Negative.  Negative for dizziness, tingling, tremors, focal weakness, seizures and headaches.  Endo/Heme/Allergies: Negative for environmental allergies. Does not bruise/bleed easily.  Psychiatric/Behavioral: Negative for  depression, hallucinations, memory loss, substance abuse and suicidal ideas. The patient is not nervous/anxious and does not have insomnia.     Blood pressure 123/81, pulse 75, temperature 97.7 F (36.5 C), temperature source Oral, resp. rate 18, height 6' (1.829 m), weight 131.1 kg (289 lb), SpO2 99 %.Body mass index is 39.2 kg/m.  General Appearance: Disheveled  Eye Contact:  Fair  Speech:  Pressured  Volume:  Increased  Mood:  Euphoric  Affect:  Labile  Thought Process:  Disorganized  Orientation:  Full (Time, Place, and Person)  Thought Content:  Illogical, Delusions, Rumination and Tangential  Suicidal Thoughts:  No  Homicidal Thoughts:  No  Memory:  Immediate;   Fair Recent;   Poor Remote;   Fair  Judgement:  Impaired  Insight:  Shallow  Psychomotor Activity:  Increased  Concentration:  Concentration: Poor  Recall:  [place] of Knowledge:  Good  Language:  Good  Akathisia:  No  Handed:  Right  AIMS (if indicated):     Assets:  Financial Resources/Insurance Housing Social Support  ADL's:  Impaired  Cognition:  Impaired,  Mild  Sleep:  Number of Hours: 5.75     Treatment Plan Summary:  Mr. Suit 50 year old married Caucasian male with a history of bipolar disorder who was admitted to the hospital for ECT treatment for mania. He was just transferred down to the medicine floor after treatment for cellulitis of the leg.  Bipolar disorder: Most recent episode manic: Patient has had ECT treatments, 8 of them at this point. He avoided having treatment today by eating. I counseled him this afternoon that I think he is clearly been showing improvement and is not having any side effects and should continue with ECT. I would like putting him on the schedule for Monday. As usual he is against this although his rationality is poor. I talked with Dr. Reuel Derby office today and reviewed how this is a typical presentation for the patient.  I am going to increase his Seroquel  tonight to 1400 mg. He is tolerating the high dose without any side effects.   Hyperlipidemia: We'll plan to continue Lipitor 40 mg by mouth daily and fenofibrate 160 mg by mouth daily  Cellulitis of the leg: Will continue Augmentin 1 tablet by mouth twice a day. Cellulitis is slowly improving.  History of pulmonary embolism: We'll continue Coumadin 8 mg by mouth nightly.  Disposition: The patient will be discharged home with his wife because he does have a stable living situation. Psychotropic medication management follow-up appointment as well as individual therapy will be scheduled with Dr. Thurmond Butts. About the same as yesterday. Hypomanic to manic and still nowhere near his baseline. Still too agitated confused and with poor insight and high risk for decompensation that precludes discharge. I am increasing his Seroquel. Continue Tegretol which is at a good therapeutic level. I'm aiming to continue to try ECT on Monday. Continue warfarin. Blood sugars under good control. Legs continue to look much better and healing up. Alethia Berthold, MD 01/12/2016, 6:50 PM

## 2016-01-12 NOTE — Progress Notes (Signed)
Recreation Therapy Notes  Date: 08.11.17 Time: 1:00 pm Location: Craft Room  Group Topic: Coping Skills  Goal Area(s) Addresses:  Patient will participate in healthy coping skill. Patient will verbalize benefit of using art as a coping skill.  Behavioral Response: Attentive, Left early  Intervention: Coloring  Activity: Patients were given coloring sheets to color and were instructed to think about the emotions they were feeling and what their minds were focused on.  Education: LRT educated patients on healthy coping skills.  Education Outcome: Patient left before LRT educated group.  Clinical Observations/Feedback: Patient colored coloring sheet. Patient left group at approximately 1:30 pm and did not return to group.  [name], LRT/CTRS 01/12/2016 2:30 PM

## 2016-01-13 LAB — PROTIME-INR
INR: 2.26
Prothrombin Time: 25.3 seconds — ABNORMAL HIGH (ref 11.4–15.2)

## 2016-01-13 LAB — GLUCOSE, CAPILLARY
GLUCOSE-CAPILLARY: 91 mg/dL (ref 65–99)
GLUCOSE-CAPILLARY: 85 mg/dL (ref 65–99)
GLUCOSE-CAPILLARY: 150 mg/dL — AB (ref 65–99)
Glucose-Capillary: 119 mg/dL — ABNORMAL HIGH (ref 65–99)
Glucose-Capillary: 78 mg/dL (ref 65–99)

## 2016-01-13 MED ORDER — WARFARIN SODIUM 3 MG PO TABS
6.0000 mg | ORAL_TABLET | Freq: Once | ORAL | Status: AC
  Administered 2016-01-13: 6 mg via ORAL
  Filled 2016-01-13: qty 2

## 2016-01-13 MED ORDER — LITHIUM CARBONATE 300 MG PO CAPS
300.0000 mg | ORAL_CAPSULE | Freq: Every day | ORAL | Status: DC
  Administered 2016-01-13 – 2016-01-18 (×6): 300 mg via ORAL
  Filled 2016-01-13 (×6): qty 1

## 2016-01-13 NOTE — [facility] Group Notes (Signed)
[facility] Group Notes:  (Nursing/MHT/Case Management/Adjunct)  Date:  01/13/2016  Time:  11:11 AM  Type of Therapy:  Goal setting  Participation Level:  Active  Participation Quality:  Attentive and Redirectable  Affect:  Appropriate  Cognitive:  Appropriate  Insight:  Improving  Engagement in Group:  Engaged  Modes of Intervention:  goal setting  Summary of Progress/Problems:  Charise Killian 01/13/2016, 11:11 AM

## 2016-01-13 NOTE — [facility] Group Notes (Signed)
[facility] Group Notes:  (Nursing/MHT/Case Management/Adjunct)  Date:  01/13/2016  Time:  2:44 AM  Type of Therapy:  Psychoeducational Skills  Participation Level:  Active  Participation Quality:  Appropriate  Affect:  Appropriate  Cognitive:  Appropriate  Insight:  Appropriate  Engagement in Group:  Engaged and Supportive  Modes of Intervention:  Discussion, Socialization and Support  Summary of Progress/Problems:  [name] 01/13/2016, 2:44 AM

## 2016-01-13 NOTE — Plan of Care (Signed)
Problem: Activity: Goal: Will verbalize the importance of balancing activity with adequate rest periods Outcome: Progressing Patient is oriented, rapid speech, disorganized thought process, cannot stay focused, jumps from topic to topic, He is pacing back and forth in hallways, nurse did get him to sit in recliner for a brief period with legs up, Nurse talked with him in length and He did agree to take rest periods.

## 2016-01-13 NOTE — Progress Notes (Signed)
[facility] MD Progress Note  01/13/2016 3:25 PM Cameron [name]  MRN:  [id number] Subjective:    Follow-up for Saturday the 12th. Patient interviewed chart reviewed. Patient continues to be hyperverbal and disorganized in his thinking. Pretty much the same as yesterday. Not suicidal not homicidal not obviously delusional but so disorganized he can't carry on a conversation and has very poor insight into the degree of his illness. Are to even sustain some kind of conversation about his treatment plan with him. He has not been violent or aggressive but still stays intrusive quite a bit with other patients. Vital signs stable. No physical complaints. As usual his legs are continuing to look better.  Principal Problem: Bipolar I disorder, current or most recent episode manic, with psychotic features ([place]) Diagnosis:   Patient Active Problem List   Diagnosis Date Noted  . Bipolar affective disorder, current episode manic ([place]) [F31.9] 01/04/2016  . Cellulitis of leg, right [L03.115] 01/01/2016  . Bipolar I disorder, current or most recent episode manic, with psychotic features ([place]) [F31.2] 12/11/2015  . Pulmonary embolism ([place]) [I26.99] 12/04/2015  . Diabetes mellitus without complication ([place]) [id number]   . Hypercholesteremia [E78.00]   . CKD (chronic kidney disease), stage III [N18.3]   . Acute deep vein thrombosis (DVT) of femoral vein of right lower extremity (HCC) [I82.411]    Total Time spent with patient: 20 minutes  Past Psychiatric History: Long history of bipolar disorder multiple manic episodes. History of response to ECT.  Past Medical History:  Past Medical History:  Diagnosis Date  . Bipolar 1 disorder ([place])   . CKD (chronic kidney disease), stage III   . Diabetes mellitus without complication ([place])   . DVT (deep venous thrombosis) ([place])   . Hypercholesteremia   . Hypertension     Past Surgical History:  Procedure Laterality Date  . APPENDECTOMY     Family History:  Family  History  Problem Relation Age of Onset  . Stroke Other   . Diabetes Other    Family Psychiatric  History: Positive for bipolar disorder Social History:  History  Alcohol Use  . Yes    Comment: rarely     History  Drug Use No    Social History   Social History  . Marital status: Married    Spouse name: N/A  . Number of children: N/A  . Years of education: N/A   Social History Main Topics  . Smoking status: Never Smoker  . Smokeless tobacco: Never Used  . Alcohol use Yes     Comment: rarely  . Drug use: No  . Sexual activity: Not Asked   Other Topics Concern  . None   Social History Narrative  . None   Additional Social History:                         Sleep: Fair  Appetite:  Fair  Current Medications: Current Facility-Administered Medications  Medication Dose Route Frequency Provider Last Rate Last Dose  . acetaminophen (TYLENOL) tablet 650 mg  650 mg Oral Q6H PRN Gonzella Lex, MD   650 mg at 01/13/16 0901  . alum & mag hydroxide-simeth (MAALOX/MYLANTA) 200-200-20 MG/5ML suspension 30 mL  30 mL Oral Q4H PRN Gonzella Lex, MD      . atorvastatin (LIPITOR) tablet 40 mg  40 mg Oral q1800 Gonzella Lex, MD   40 mg at 01/12/16 1828  . carbamazepine (TEGRETOL) tablet 1,200 mg  1,200  mg Oral BID PC Gonzella Lex, MD   1,200 mg at 01/13/16 0858  . dextrose 5 % solution 250 mL  250 mL Intravenous Once Gonzella Lex, MD      . enalapril (VASOTEC) tablet 5 mg  5 mg Oral BID Gonzella Lex, MD   5 mg at 01/13/16 0858  . fenofibrate tablet 160 mg  160 mg Oral Daily Gonzella Lex, MD   160 mg at 01/13/16 0858  . haloperidol lactate (HALDOL) injection 20 mg  20 mg Intramuscular Q6H PRN Gonzella Lex, MD      . insulin glargine (LANTUS) injection 60 Units  60 Units Subcutaneous QHS Gonzella Lex, MD   60 Units at 01/12/16 2134  . lamoTRIgine (LAMICTAL) tablet 200 mg  200 mg Oral Daily Gonzella Lex, MD   200 mg at 01/13/16 0859  . lithium carbonate capsule  300 mg  300 mg Oral QHS Gonzella Lex, MD      . LORazepam (ATIVAN) injection 2 mg  2 mg Intramuscular Q6H PRN Gonzella Lex, MD      . LORazepam (ATIVAN) tablet 1 mg  1 mg Oral Q4H PRN Rainey Pines, MD   1 mg at 01/12/16 0440  . magnesium hydroxide (MILK OF MAGNESIA) suspension 30 mL  30 mL Oral Daily PRN Gonzella Lex, MD      . QUEtiapine (SEROQUEL) tablet 1,400 mg  1,400 mg Oral QHS Gonzella Lex, MD   1,400 mg at 01/12/16 2133  . temazepam (RESTORIL) capsule 15 mg  15 mg Oral QHS Gonzella Lex, MD   15 mg at 01/12/16 2133  . warfarin (COUMADIN) tablet 6 mg  6 mg Oral ONCE-1800 Gonzella Lex, MD      . Warfarin - Pharmacist Dosing Inpatient   Does not apply q1800 Gonzella Lex, MD        Lab Results:  Results for orders placed or performed during the hospital encounter of 01/04/16 (from the past 48 hour(s))  Glucose, capillary     Status: Abnormal   Collection Time: 01/11/16  4:46 PM  Result Value Ref Range   Glucose-Capillary 129 (H) 65 - 99 mg/dL   Comment 1 Notify RN   Glucose, capillary     Status: Abnormal   Collection Time: 01/11/16  8:35 PM  Result Value Ref Range   Glucose-Capillary 123 (H) 65 - 99 mg/dL  Glucose, capillary     Status: None   Collection Time: 01/12/16  6:15 AM  Result Value Ref Range   Glucose-Capillary 71 65 - 99 mg/dL  Protime-INR     Status: Abnormal   Collection Time: 01/12/16  6:30 AM  Result Value Ref Range   Prothrombin Time 23.0 (H) 11.4 - 15.2 seconds   INR 2.00   Glucose, capillary     Status: None   Collection Time: 01/12/16 12:18 PM  Result Value Ref Range   Glucose-Capillary 70 65 - 99 mg/dL   Comment 1 Notify RN   Glucose, capillary     Status: Abnormal   Collection Time: 01/12/16  4:44 PM  Result Value Ref Range   Glucose-Capillary 102 (H) 65 - 99 mg/dL   Comment 1 Notify RN   Glucose, capillary     Status: None   Collection Time: 01/12/16  8:40 PM  Result Value Ref Range   Glucose-Capillary 98 65 - 99 mg/dL  Glucose,  capillary     Status: None  Collection Time: 01/13/16  6:35 AM  Result Value Ref Range   Glucose-Capillary 91 65 - 99 mg/dL  Protime-INR     Status: Abnormal   Collection Time: 01/13/16  6:51 AM  Result Value Ref Range   Prothrombin Time 25.3 (H) 11.4 - 15.2 seconds   INR 2.26   Glucose, capillary     Status: Abnormal   Collection Time: 01/13/16 11:25 AM  Result Value Ref Range   Glucose-Capillary 119 (H) 65 - 99 mg/dL   Comment 1 Notify RN     Blood Alcohol level:  Lab Results  Component Value Date   ETH <5 [id number]    Metabolic Disorder Labs: Lab Results  Component Value Date   HGBA1C 7.4 (H) 01/02/2016   MPG 192 12/04/2015   MPG 318 (H) 08/24/2010   Lab Results  Component Value Date   PROLACTIN 4.4 12/11/2015   Lab Results  Component Value Date   CHOL 143 12/11/2015   TRIG 286 (H) 12/11/2015   HDL 32 (L) 12/11/2015   CHOLHDL 4.5 12/11/2015   VLDL 57 (H) 12/11/2015   LDLCALC 54 12/11/2015   LDLCALC 56 12/05/2015    Physical Findings: AIMS: Facial and Oral Movements Muscles of Facial Expression: None, normal Lips and Perioral Area: None, normal Jaw: None, normal Tongue: None, normal,Extremity Movements Upper (arms, wrists, hands, fingers): None, normal Lower (legs, knees, ankles, toes): None, normal, Trunk Movements Neck, shoulders, hips: None, normal, Overall Severity Severity of abnormal movements (highest score from questions above): None, normal Incapacitation due to abnormal movements: None, normal Patient's awareness of abnormal movements (rate only patient's report): No Awareness, Dental Status Current problems with teeth and/or dentures?: No Does patient usually wear dentures?: No  CIWA:    COWS:     Musculoskeletal: Strength & Muscle Tone: within normal limits Gait & Station: normal Patient leans: N/A  Psychiatric Specialty Exam: Physical Exam  Nursing note and vitals reviewed. Constitutional: He appears well-developed and  well-nourished.  HENT:  Head: Normocephalic and atraumatic.  Eyes: Conjunctivae are normal. Pupils are equal, round, and reactive to light.  Neck: Normal range of motion.  Cardiovascular: Normal heart sounds.   Respiratory: Effort normal. No respiratory distress.  GI: Soft.  Musculoskeletal: Normal range of motion.  Neurological: He is alert.  Skin: Skin is warm and dry.     Psychiatric: His affect is labile and inappropriate. His speech is tangential. He is hyperactive. Thought content is delusional. He expresses impulsivity. He expresses no homicidal and no suicidal ideation. He exhibits abnormal recent memory. He is inattentive.    Review of Systems  Constitutional: Negative.  Negative for chills, fever, malaise/fatigue and weight loss.  HENT: Negative.  Negative for hearing loss, nosebleeds, sore throat and tinnitus.   Eyes: Negative.  Negative for blurred vision and double vision.  Respiratory: Negative.  Negative for cough, hemoptysis, sputum production, shortness of breath and wheezing.   Cardiovascular: Negative.  Negative for chest pain, palpitations and claudication.  Gastrointestinal: Negative.  Negative for abdominal pain, blood in stool, constipation, diarrhea, heartburn, nausea and vomiting.  Genitourinary: Negative for dysuria, frequency and urgency.  Musculoskeletal: Negative.  Negative for back pain, falls, joint pain, myalgias and neck pain.  Skin: Negative.  Negative for itching and rash.  Neurological: Negative.  Negative for dizziness, tingling, tremors, focal weakness, seizures and headaches.  Endo/Heme/Allergies: Negative for environmental allergies. Does not bruise/bleed easily.  Psychiatric/Behavioral: Negative for depression, hallucinations, memory loss, substance abuse and suicidal ideas. The patient is not nervous/anxious  and does not have insomnia.     Blood pressure 116/75, pulse 86, temperature 97.7 F (36.5 C), temperature source Oral, resp. rate 18,  height 6' (1.829 m), weight 131.1 kg (289 lb), SpO2 99 %.Body mass index is 39.2 kg/m.  General Appearance: Disheveled  Eye Contact:  Fair  Speech:  Pressured  Volume:  Increased  Mood:  Euphoric  Affect:  Labile  Thought Process:  Disorganized  Orientation:  Full (Time, Place, and Person)  Thought Content:  Illogical, Delusions, Rumination and Tangential  Suicidal Thoughts:  No  Homicidal Thoughts:  No  Memory:  Immediate;   Fair Recent;   Poor Remote;   Fair  Judgement:  Impaired  Insight:  Shallow  Psychomotor Activity:  Increased  Concentration:  Concentration: Poor  Recall:  [place] of Knowledge:  Good  Language:  Good  Akathisia:  No  Handed:  Right  AIMS (if indicated):     Assets:  Financial Resources/Insurance Housing Social Support  ADL's:  Impaired  Cognition:  Impaired,  Mild  Sleep:  Number of Hours: 5     Treatment Plan Summary:  Mr. Tremel 50 year old married Caucasian male with a history of bipolar disorder who was admitted to the hospital for ECT treatment for mania. He was just transferred down to the medicine floor after treatment for cellulitis of the leg.  Bipolar disorder: Most recent episode manic: Patient has had ECT treatments, 8 of them at this point. He avoided having treatment today by eating. I counseled him this afternoon that I think he is clearly been showing improvement and is not having any side effects and should continue with ECT. I would like putting him on the schedule for Monday. As usual he is against this although his rationality is poor. I talked with Dr. Reuel Derby office today and reviewed how this is a typical presentation for the patient.  I am going to increase his Seroquel tonight to 1400 mg. He is tolerating the high dose without any side effects.   Hyperlipidemia: We'll plan to continue Lipitor 40 mg by mouth daily and fenofibrate 160 mg by mouth daily  Cellulitis of the leg: Will continue Augmentin 1 tablet by mouth  twice a day. Cellulitis is slowly improving.  History of pulmonary embolism: We'll continue Coumadin 8 mg by mouth nightly.  Disposition: The patient will be discharged home with his wife because he does have a stable living situation. Psychotropic medication management follow-up appointment as well as individual therapy will be scheduled with Dr. Thurmond Butts. Patient refused ECT yesterday. On conversation he is indicating that he intends to continue trying to refuse ECT. It's so hard to talk within limits essentially pointless to make much of an effort to talk him into it. Having spoken with him on several occasions about lithium and looked back over his labs in chart and given that he is not getting better with his current doses of mood stabilizers and antipsychotics I am going to try adding a very low dose of lithium. I am aware that he has some renal insufficiency from his diabetes. We will start at only 300 mg at night. We will monitor closely for side effects. Meanwhile I am very much hoping we can get him to go to ECT on Monday. Alethia Berthold, MD 01/13/2016, 3:25 PM

## 2016-01-13 NOTE — Progress Notes (Signed)
Pt up pacing unit, tearful but will not elaborate. Safety maintained.

## 2016-01-13 NOTE — Progress Notes (Signed)
Pt stated to staff member that he was crying because a fellow Pt died (they are not deceased and are current pt on hall). Pt processed with staff member. No further issues.

## 2016-01-13 NOTE — Progress Notes (Signed)
ANTICOAGULATION CONSULT NOTE - Follow Up Consult  Pharmacy Consult for Warfarin Indication: VTE treatment  Allergies  Allergen Reactions  . Asa [Aspirin] Other (See Comments)    Patient tolerates LOW DOSE ASPIRIN.    Patient Measurements: Height: 6' (182.9 cm) Weight: 289 lb (131.1 kg) IBW/kg (Calculated) : 77.6  Vital Signs: BP: 116/75 (08/12 0701) Pulse Rate: 86 (08/12 0701)  Labs:  Recent Labs  01/11/16 0637 01/12/16 0630 01/13/16 0651  HGB 11.7*  --   --   HCT 34.0*  --   --   PLT 234  --   --   LABPROT 22.3* 23.0* 25.3*  INR 1.93 2.00 2.26   Estimated Creatinine Clearance: 76.5 mL/min (by C-G formula based on SCr of 1.6 mg/dL).  Medical History: Past Medical History:  Diagnosis Date  . Bipolar 1 disorder ([place])   . CKD (chronic kidney disease), stage III   . Diabetes mellitus without complication ([place])   . DVT (deep venous thrombosis) ([place])   . Hypercholesteremia   . Hypertension    Medications:  Warfarin   Assessment: 34 yom admitted to ED B[facility] with manic symptoms. Recently discharged from [facility] with DVT/PE, had been bridging LMWH and VKA prior to admission.  INR History: 7/9: INR - 1.99 7/10: no INR- warfarin 12.5 mg po daily at 0200 7/11: INR - 2.97- warfarin held 7/12: INR - 1.92 - warfarin 10 mg 7/13: INR - 1.95 - warfarin 10 mg 7/14: INR - 2.56 - warfarin 5 mg 7/15: INR - 2.73 - warfarin 5 mg 7/16: INR - 2.39 - warfarin 7.5 mg 7/17: INR - 2.22 - warfarin 7.5 mg 7/18: INR - 2.53 - warfarin 7.5 mg 7/19: INR - 2.75 - warfarin 7.5 mg 7/20: INR - 2.99 - warfarin 6.5 mg 7/21: INR - 2.64 - warfarin 6.5 mg  7/22: INR: 2.44; warfarin 8  7/23: INR: 2.60;  Warfarin 10mg  7/24: INR 2.44 - warfarin 10mg  7/25:  INR 3.22 - warfarin 10mg  7/26:  INR 3.08 - warfarin 8mg  7/27:  INR 3.24 - warfarin 8 mg 7/28:  INR 2.79 - 8 mg 7/29:  INR 2.74- 8 mg 7/30:  INR 3.19 - 7 mg 7/31:  INR 2.73  7.5mg  8/1:    INR 3.13  7.5mg  8/2:    INR 2.74  7.5mg  8/3     INR  2.27  7.5mg  8/4     INR 3.19  8 mg 8/5     INR 3.04  7.5 mg 8/6     INR 3.05  7.5mg  8/7:    INR  3.22  6mg  8/8:    INR  3.85  Dose held per protocol 8/9:    INR 2.4     6mg  8/10:  INR 1.93   7mg   8/11:  INR  2.00  7mg  8/12:  INR  2.26    Goal of Therapy:  INR 2-3 Monitor platelets by anticoagulation protocol: Yes   Plan:  INR is therapeutic today. Will give warfarin 6 mg once and follow up INR in the AM.  (Patient with orders for carbamazepine which interacts with warfarin to decrease the INR. Patient has orders for fenofibrate which interacts with warfarin to increase the INR. Will follow INR closely.)   Pt will need CBC q3 days per policy.   Pharmacy will continue to follow.   Napoleon Form, [facility] Clinical Pharmacist 01/13/2016,7:51 AM

## 2016-01-13 NOTE — Progress Notes (Addendum)
Hyper verbal. Tangential. Disoriented to situation. Able to be redirected when needed without issue. Compliant with evening medications. Attended evening group. Paced unit. Denies any SI/AVH. Currently resting in bed with eyes closed. Safety maintained. Will continue to monitor.

## 2016-01-13 NOTE — Progress Notes (Signed)
Patient remains manic , pacing and rapid speech, Patient with disorganized thoughts, q 15 min. Checks in progress.

## 2016-01-13 NOTE — B[facility] Group Notes (Signed)
[place] LCSW Group Therapy  01/13/2016 2:32 PM  Type of Therapy:  Group Therapy  Participation Level:  Active  Participation Quality:  Attentive, Redirectable and Sharing  Affect:  Excited  Cognitive:  Lacking  Insight:  Improving  Engagement in Therapy:  Engaged and Supportive  Modes of Intervention:  Activity, Reality Testing, Socialization and Support  Summary of Progress/Problems:Self esteem: Patients discussed self esteem and how it impacts them. They discussed what aspects in their lives has influenced their self esteem. They were challenged to identify changes that are needed in order to improve self esteem. Patients participated in activity where they had to identify positive adjectives they felt described their personality. Patients shared with the group on the following areas: Things I am good at, What I like about my appearance, I've helped others by, What I value the most, compliments I have received, challenges I have overcome, thing that make me unique, and Times I've made others happy. Pt was engaged in group and supportive to his peers. Pt needed redirection to prevent interrupting his peers. Pt seemed to do better with this group activity because he was able to write down his thoughts and then share.  Pt writing down his thoughts helped pt avoid stating random thoughts unrelated to the group discussion. CSW provided support and encouragement to pt for his progress and his ability to remain focused for the majority of group.   Brantley Wiley G. [place], [place] 01/13/2016, 2:40 PM

## 2016-01-13 NOTE — Progress Notes (Signed)
Pt intrusive and hyper verbal. Frequently at nurses station asking [place] to do things for other Pts. Disorganized and tangential. Denies any SI. Pacing unit. Restless. Safety maintained. Denies any pain. Voices no additional concerns at this time. Will continue to monitor.

## 2016-01-13 NOTE — Tx Team (Signed)
Interdisciplinary Treatment Plan Update (Adult)  Date:  01/13/2016  (Late entry from 01/11/2016) Time Reviewed:  3:11 PM  Progress in Treatment: Attending groups: Yes. Participating in groups:  Yes. Taking medication as prescribed:  Yes. Tolerating medication:  Yes. Family/Significant othe contact made:  Yes, individual(s) contacted:  Patients wife Patient understands diagnosis:  Yes. Discussing patient identified problems/goals with staff:  Yes. Medical problems stabilized or resolved:  Yes. Denies suicidal/homicidal ideation: Yes. Issues/concerns per patient self-inventory:  No. Other:  New problem(s) identified: n/a  Discharge Plan or Barriers: Home with wife, Medication management follow-up with Dr. Thurmond Butts.   Reason for Continuation of Hospitalization: Depression Mania Medication stabilization Other; describe ECT Treatment  Comments:Pt more redirectable, remains labile, hyperverbal, disorganized, grandiose.   Otherwise cooperative, remains on one to one sitter due to continuing unpredictable behavior.  Estimated length of stay: 7days  New goal(s): n/a  Review of initial/current patient goals per problem list:   1. Goal(s): Patient will participate in aftercare plan  Met: YesTarget date: 3-5 days post admission date  As evidenced by: Patient will participate within aftercare plan AEB aftercare provider and housing plan at discharge being identified.  8/4: Pt will later discharge home to [facility] to live with his family and will follow up with [facility] Butts Psychiatry for medication management and with [facility] for therapy   2. . Goal(s): Patient will demonstrate decreased signs of psychosis  * Met: No * Target date: 3-5 days post admission date  * As evidenced by: Patient will demonstrate decreased frequency of AVH or return to baseline function    3. Goal (s): Patient will demonstrate decreased signs of mania  * Met:  No * Target date: 3-5 days post admission date  * As evidenced by: Patient demonstrate decreased signs of mania AEB decreased mood instability and demonstration of stable mood    Attendees: Patient:  Cameron [name] 8/12/20173:11 PM  Family:   8/12/20173:11 PM  Physician:  Dr. Weber Cooks 8/12/20173:11 PM  Nursing:   Carolynn Sayers 8/12/20173:11 PM  Case Manager:   8/12/20173:11 PM  Counselor:   8/12/20173:11 PM  Other:  Everitt Amber, LRT 8/12/20173:11 PM  Other:  Lear Ng. [place] Jabs MSW, [place] 8/12/20173:11 PM  Other:   8/12/20173:11 PM  Other:  8/12/20173:11 PM  Other:  8/12/20173:11 PM  Other:  8/12/20173:11 PM  Other:  8/12/20173:11 PM  Other:  8/12/20173:11 PM  Other:  8/12/20173:11 PM  Other:   8/12/20173:11 PM   Scribe for Treatment Team:   Jolaine Click,  01/13/2016, 3:15 PM

## 2016-01-14 ENCOUNTER — Other Ambulatory Visit: Payer: Self-pay | Admitting: Psychiatry

## 2016-01-14 LAB — CBC
HCT: 35.4 % — ABNORMAL LOW (ref 40.0–52.0)
HEMOGLOBIN: 11.9 g/dL — AB (ref 13.0–18.0)
MCH: 28.7 pg (ref 26.0–34.0)
MCHC: 33.6 g/dL (ref 32.0–36.0)
MCV: 85.4 fL (ref 80.0–100.0)
PLATELETS: 222 10*3/uL (ref 150–440)
RBC: 4.14 MIL/uL — AB (ref 4.40–5.90)
RDW: 12.8 % (ref 11.5–14.5)
WBC: 2.8 10*3/uL — AB (ref 3.8–10.6)

## 2016-01-14 LAB — PROTIME-INR
INR: 2.22
PROTHROMBIN TIME: 25 s — AB (ref 11.4–15.2)

## 2016-01-14 LAB — GLUCOSE, CAPILLARY
GLUCOSE-CAPILLARY: 50 mg/dL — AB (ref 65–99)
GLUCOSE-CAPILLARY: 77 mg/dL (ref 65–99)
GLUCOSE-CAPILLARY: 107 mg/dL — AB (ref 65–99)
GLUCOSE-CAPILLARY: 67 mg/dL (ref 65–99)
Glucose-Capillary: 72 mg/dL (ref 65–99)
Glucose-Capillary: 143 mg/dL — ABNORMAL HIGH (ref 65–99)

## 2016-01-14 MED ORDER — WARFARIN SODIUM 4 MG PO TABS
7.0000 mg | ORAL_TABLET | Freq: Every day | ORAL | Status: DC
  Administered 2016-01-14 – 2016-01-15 (×2): 7 mg via ORAL
  Filled 2016-01-14 (×2): qty 1

## 2016-01-14 MED ORDER — INSULIN GLARGINE 100 UNIT/ML ~~LOC~~ SOLN
45.0000 [IU] | Freq: Every day | SUBCUTANEOUS | Status: DC
  Administered 2016-01-14: 45 [IU] via SUBCUTANEOUS
  Filled 2016-01-14 (×3): qty 0.45

## 2016-01-14 NOTE — Progress Notes (Signed)
[facility] MD Progress Note  01/14/2016 4:38 PM Cameron [name]  MRN:  [id number] Subjective:    Follow-up Sunday the 13th. Patient seen. Chart reviewed. Talked with nursing. Patient is still hyperverbal and disorganized in his thinking although I would like to think that maybe he is gradually getting a little bit better. He had no complaints about starting the low dose of lithium last night. His thoughts are still too disorganized to be completely clear about his agreement to ECT but he is suggesting that he will probably be agreeable to it tomorrow. Not behaving in any violent manner right now.  Principal Problem: Bipolar I disorder, current or most recent episode manic, with psychotic features ([place]) Diagnosis:   Patient Active Problem List   Diagnosis Date Noted  . Bipolar affective disorder, current episode manic ([place]) [F31.9] 01/04/2016  . Cellulitis of leg, right [L03.115] 01/01/2016  . Bipolar I disorder, current or most recent episode manic, with psychotic features ([place]) [F31.2] 12/11/2015  . Pulmonary embolism ([place]) [I26.99] 12/04/2015  . Diabetes mellitus without complication ([name]) [id number]   . Hypercholesteremia [E78.00]   . CKD (chronic kidney disease), stage III [N18.3]   . Acute deep vein thrombosis (DVT) of femoral vein of right lower extremity (HCC) [I82.411]    Total Time spent with patient: 20 minutes  Past Psychiatric History: Long history of bipolar disorder multiple manic episodes. History of response to ECT.  Past Medical History:  Past Medical History:  Diagnosis Date  . Bipolar 1 disorder ([place])   . CKD (chronic kidney disease), stage III   . Diabetes mellitus without complication ([place])   . DVT (deep venous thrombosis) ([place])   . Hypercholesteremia   . Hypertension     Past Surgical History:  Procedure Laterality Date  . APPENDECTOMY     Family History:  Family History  Problem Relation Age of Onset  . Stroke Other   . Diabetes Other    Family Psychiatric   History: Positive for bipolar disorder Social History:  History  Alcohol Use  . Yes    Comment: rarely     History  Drug Use No    Social History   Social History  . Marital status: Married    Spouse name: N/A  . Number of children: N/A  . Years of education: N/A   Social History Main Topics  . Smoking status: Never Smoker  . Smokeless tobacco: Never Used  . Alcohol use Yes     Comment: rarely  . Drug use: No  . Sexual activity: Not Asked   Other Topics Concern  . None   Social History Narrative  . None   Additional Social History:                         Sleep: Fair  Appetite:  Fair  Current Medications: Current Facility-Administered Medications  Medication Dose Route Frequency Provider Last Rate Last Dose  . acetaminophen (TYLENOL) tablet 650 mg  650 mg Oral Q6H PRN Gonzella Lex, MD   650 mg at 01/13/16 0901  . alum & mag hydroxide-simeth (MAALOX/MYLANTA) 200-200-20 MG/5ML suspension 30 mL  30 mL Oral Q4H PRN Gonzella Lex, MD      . atorvastatin (LIPITOR) tablet 40 mg  40 mg Oral q1800 Gonzella Lex, MD   40 mg at 01/13/16 1701  . carbamazepine (TEGRETOL) tablet 1,200 mg  1,200 mg Oral BID PC Gonzella Lex, MD   1,200 mg at  01/14/16 0930  . dextrose 5 % solution 250 mL  250 mL Intravenous Once Gonzella Lex, MD      . enalapril (VASOTEC) tablet 5 mg  5 mg Oral BID Gonzella Lex, MD   5 mg at 01/14/16 0930  . fenofibrate tablet 160 mg  160 mg Oral Daily Gonzella Lex, MD   160 mg at 01/14/16 0930  . haloperidol lactate (HALDOL) injection 20 mg  20 mg Intramuscular Q6H PRN Gonzella Lex, MD      . insulin glargine (LANTUS) injection 60 Units  60 Units Subcutaneous QHS Gonzella Lex, MD   60 Units at 01/13/16 2203  . lamoTRIgine (LAMICTAL) tablet 200 mg  200 mg Oral Daily Gonzella Lex, MD   200 mg at 01/14/16 0930  . lithium carbonate capsule 300 mg  300 mg Oral QHS Gonzella Lex, MD   300 mg at 01/13/16 2107  . LORazepam (ATIVAN) injection  2 mg  2 mg Intramuscular Q6H PRN Gonzella Lex, MD      . LORazepam (ATIVAN) tablet 1 mg  1 mg Oral Q4H PRN Rainey [place]ines, MD   1 mg at 01/12/16 0440  . magnesium hydroxide (MILK OF MAGNESIA) suspension 30 mL  30 mL Oral Daily PRN Gonzella Lex, MD      . QUEtiapine (SEROQUEL) tablet 1,400 mg  1,400 mg Oral QHS Gonzella Lex, MD   1,400 mg at 01/13/16 2106  . temazepam (RESTORIL) capsule 15 mg  15 mg Oral QHS Gonzella Lex, MD   15 mg at 01/13/16 2106  . warfarin (COUMADIN) tablet 7 mg  7 mg Oral q1800 Gonzella Lex, MD      . Warfarin - Pharmacist Dosing Inpatient   Does not apply q1800 Gonzella Lex, MD   6 each at 01/13/16 1701    Lab Results:  Results for orders placed or performed during the hospital encounter of 01/04/16 (from the past 48 hour(s))  Glucose, capillary     Status: Abnormal   Collection Time: 01/12/16  4:44 PM  Result Value Ref Range   Glucose-Capillary 102 (H) 65 - 99 mg/dL   Comment 1 Notify RN   Glucose, capillary     Status: None   Collection Time: 01/12/16  8:40 PM  Result Value Ref Range   Glucose-Capillary 98 65 - 99 mg/dL  Glucose, capillary     Status: None   Collection Time: 01/13/16  6:35 AM  Result Value Ref Range   Glucose-Capillary 91 65 - 99 mg/dL  Protime-INR     Status: Abnormal   Collection Time: 01/13/16  6:51 AM  Result Value Ref Range   Prothrombin Time 25.3 (H) 11.4 - 15.2 seconds   INR 2.26   Glucose, capillary     Status: Abnormal   Collection Time: 01/13/16 11:25 AM  Result Value Ref Range   Glucose-Capillary 119 (H) 65 - 99 mg/dL   Comment 1 Notify RN   Glucose, capillary     Status: None   Collection Time: 01/13/16  4:15 PM  Result Value Ref Range   Glucose-Capillary 85 65 - 99 mg/dL   Comment 1 Notify RN   Glucose, capillary     Status: None   Collection Time: 01/13/16  9:15 PM  Result Value Ref Range   Glucose-Capillary 78 65 - 99 mg/dL  Glucose, capillary     Status: Abnormal   Collection Time: 01/13/16 10:01 PM    Result  Value Ref Range   Glucose-Capillary 150 (H) 65 - 99 mg/dL  Glucose, capillary     Status: Abnormal   Collection Time: 01/14/16  6:32 AM  Result Value Ref Range   Glucose-Capillary 50 (L) 65 - 99 mg/dL  Protime-INR     Status: Abnormal   Collection Time: 01/14/16  6:53 AM  Result Value Ref Range   Prothrombin Time 25.0 (H) 11.4 - 15.2 seconds   INR 2.22   CBC     Status: Abnormal   Collection Time: 01/14/16  6:53 AM  Result Value Ref Range   WBC 2.8 (L) 3.8 - 10.6 K/uL   RBC 4.14 (L) 4.40 - 5.90 MIL/uL   Hemoglobin 11.9 (L) 13.0 - 18.0 g/dL   HCT 35.4 (L) 40.0 - 52.0 %   MCV 85.4 80.0 - 100.0 fL   MCH 28.7 26.0 - 34.0 pg   MCHC 33.6 32.0 - 36.0 g/dL   RDW 12.8 11.5 - 14.5 %   Platelets 222 150 - 440 K/uL  Glucose, capillary     Status: None   Collection Time: 01/14/16  6:56 AM  Result Value Ref Range   Glucose-Capillary 77 65 - 99 mg/dL   Comment 1 Notify RN   Glucose, capillary     Status: Abnormal   Collection Time: 01/14/16 12:11 PM  Result Value Ref Range   Glucose-Capillary 107 (H) 65 - 99 mg/dL  Glucose, capillary     Status: None   Collection Time: 01/14/16  4:12 PM  Result Value Ref Range   Glucose-Capillary 67 65 - 99 mg/dL    Blood Alcohol level:  Lab Results  Component Value Date   ETH <5 [id number]    Metabolic Disorder Labs: Lab Results  Component Value Date   HGBA1C 7.4 (H) 01/02/2016   MPG 192 12/04/2015   MPG 318 (H) 08/24/2010   Lab Results  Component Value Date   PROLACTIN 4.4 12/11/2015   Lab Results  Component Value Date   CHOL 143 12/11/2015   TRIG 286 (H) 12/11/2015   HDL 32 (L) 12/11/2015   CHOLHDL 4.5 12/11/2015   VLDL 57 (H) 12/11/2015   LDLCALC 54 12/11/2015   LDLCALC 56 12/05/2015    Physical Findings: AIMS: Facial and Oral Movements Muscles of Facial Expression: None, normal Lips and Perioral Area: None, normal Jaw: None, normal Tongue: None, normal,Extremity Movements Upper (arms, wrists, hands, fingers):  None, normal Lower (legs, knees, ankles, toes): None, normal, Trunk Movements Neck, shoulders, hips: None, normal, Overall Severity Severity of abnormal movements (highest score from questions above): None, normal Incapacitation due to abnormal movements: None, normal Patient's awareness of abnormal movements (rate only patient's report): No Awareness, Dental Status Current problems with teeth and/or dentures?: No Does patient usually wear dentures?: No  CIWA:    COWS:     Musculoskeletal: Strength & Muscle Tone: within normal limits Gait & Station: normal Patient leans: N/A  Psychiatric Specialty Exam: Physical Exam  Nursing note and vitals reviewed. Constitutional: He appears well-developed and well-nourished.  HENT:  Head: Normocephalic and atraumatic.  Eyes: Conjunctivae are normal. Pupils are equal, round, and reactive to light.  Neck: Normal range of motion.  Cardiovascular: Normal heart sounds.   Respiratory: Effort normal. No respiratory distress.  GI: Soft.  Musculoskeletal: Normal range of motion.  Neurological: He is alert.  Skin: Skin is warm and dry.     Psychiatric: His affect is labile and inappropriate. His speech is tangential. He is hyperactive. Thought content is  delusional. He expresses impulsivity. He expresses no homicidal and no suicidal ideation. He exhibits abnormal recent memory. He is inattentive.    Review of Systems  Constitutional: Negative.  Negative for chills, fever, malaise/fatigue and weight loss.  HENT: Negative.  Negative for hearing loss, nosebleeds, sore throat and tinnitus.   Eyes: Negative.  Negative for blurred vision and double vision.  Respiratory: Negative.  Negative for cough, hemoptysis, sputum production, shortness of breath and wheezing.   Cardiovascular: Negative.  Negative for chest pain, palpitations and claudication.  Gastrointestinal: Negative.  Negative for abdominal pain, blood in stool, constipation, diarrhea, heartburn,  nausea and vomiting.  Genitourinary: Negative for dysuria, frequency and urgency.  Musculoskeletal: Negative.  Negative for back pain, falls, joint pain, myalgias and neck pain.  Skin: Negative.  Negative for itching and rash.  Neurological: Negative.  Negative for dizziness, tingling, tremors, focal weakness, seizures and headaches.  Endo/Heme/Allergies: Negative for environmental allergies. Does not bruise/bleed easily.  Psychiatric/Behavioral: Negative for depression, hallucinations, memory loss, substance abuse and suicidal ideas. The patient is not nervous/anxious and does not have insomnia.     Blood pressure 112/69, pulse 84, temperature 98 F (36.7 C), temperature source Oral, resp. rate 18, height 6' (1.829 m), weight 131.1 kg (289 lb), SpO2 100 %.Body mass index is 39.2 kg/m.  General Appearance: Disheveled  Eye Contact:  Fair  Speech:  Pressured  Volume:  Increased  Mood:  Euphoric  Affect:  Labile  Thought Process:  Disorganized  Orientation:  Full (Time, Place, and Person)  Thought Content:  Illogical, Delusions, Rumination and Tangential  Suicidal Thoughts:  No  Homicidal Thoughts:  No  Memory:  Immediate;   Fair Recent;   Poor Remote;   Fair  Judgement:  Impaired  Insight:  Shallow  Psychomotor Activity:  Increased  Concentration:  Concentration: Poor  Recall:  [place] Agency of Knowledge:  Good  Language:  Good  Akathisia:  No  Handed:  Right  AIMS (if indicated):     Assets:  Financial Resources/Insurance Housing Social Support  ADL's:  Impaired  Cognition:  Impaired,  Mild  Sleep:  Number of Hours: 8.15     Treatment Plan Summary:  Cameron [name] 50 year old married Caucasian male with a history of bipolar disorder who was admitted to the hospital for ECT treatment for mania. He was just transferred down to the medicine floor after treatment for cellulitis of the leg.  Bipolar disorder: Most recent episode manic: Patient has had ECT treatments, 8 of them at  this point. He avoided having treatment today by eating. I counseled him this afternoon that I think he is clearly been showing improvement and is not having any side effects and should continue with ECT. I would like putting him on the schedule for Monday. As usual he is against this although his rationality is poor. I talked with Dr. Reuel Derby office today and reviewed how this is a typical presentation for the patient.  I am going to increase his Seroquel tonight to 1400 mg. He is tolerating the high dose without any side effects.   Hyperlipidemia: We'll plan to continue Lipitor 40 mg by mouth daily and fenofibrate 160 mg by mouth daily  Cellulitis of the leg: Will continue Augmentin 1 tablet by mouth twice a day. Cellulitis is slowly improving.  History of pulmonary embolism: We'll continue Coumadin 8 mg by mouth nightly.  Disposition: The patient will be discharged home with his wife because he does have a stable living situation. Psychotropic  medication management follow-up appointment as well as individual therapy will be scheduled with Dr. Thurmond Butts. Continue lithium at this low dose for now. ECT will be scheduled in ordered for tomorrow. Continue other medicine. Support with patient and continue to try and get him to work on thinking more clearly. He is still manic but I think he is getting a little better. I also spoke with nursing about his blood sugars which are now running too low. We will cut back on his insulin significantly. Alethia Berthold, MD 01/14/2016, 4:38 PM

## 2016-01-14 NOTE — [facility] Group Notes (Signed)
[facility] LCSW Group Therapy  01/14/2016 3:37 PM  Type of Therapy:  Group Therapy  Participation Level:  Pt did not attend group. CSW invited pt to group.   Summary of Progress/Problems:Safety Planning: Patients identified fears or worries surrounding discharge. Patients offered support to their peers and openly developed safety plans for their individual needs. Patients developed their own safety plan. Patients discussed their warning signs, coping strategies, support system with family and friends, identified mental health professionals, and how to keep their environments safe (ex. Removing unnecessary medications or removing weapons/guns). Patients then discussed their personalized safety plan with the group.    Sheyla Zaffino G. [place], [place] 01/14/2016, 3:37 PM

## 2016-01-14 NOTE — Plan of Care (Signed)
Problem: Health Behavior/Discharge Planning: Goal: Compliance with prescribed medication regimen will improve Outcome: Progressing Cooperative with medication administration. Mouth checked, No cheecking noted.

## 2016-01-14 NOTE — Progress Notes (Signed)
ANTICOAGULATION CONSULT NOTE - Follow Up Consult  Pharmacy Consult for Warfarin Indication: VTE treatment  Allergies  Allergen Reactions  . Asa [Aspirin] Other (See Comments)    Patient tolerates LOW DOSE ASPIRIN.    Patient Measurements: Height: 6' (182.9 cm) Weight: 289 lb (131.1 kg) IBW/kg (Calculated) : 77.6  Vital Signs: Temp: 98 F (36.7 C) (08/13 0659) Temp Source: Oral (08/13 0659) BP: 112/69 (08/13 0659) Pulse Rate: 84 (08/13 0659)  Labs:  Recent Labs  01/12/16 0630 01/13/16 0651 01/14/16 0653  HGB  --   --  11.9*  HCT  --   --  35.4*  PLT  --   --  222  LABPROT 23.0* 25.3* 25.0*  INR 2.00 2.26 2.22   Estimated Creatinine Clearance: 76.5 mL/min (by C-G formula based on SCr of 1.6 mg/dL).  Medical History: Past Medical History:  Diagnosis Date  . Bipolar 1 disorder ([place])   . CKD (chronic kidney disease), stage III   . Diabetes mellitus without complication ([place])   . DVT (deep venous thrombosis) ([place])   . Hypercholesteremia   . Hypertension    Medications:  Warfarin   Assessment: 22 yom admitted to ED [facility] with manic symptoms. Recently discharged from [facility] with DVT/PE, had been bridging LMWH and VKA prior to admission.  INR History: 7/9: INR - 1.99 7/10: no INR- warfarin 12.5 mg po daily at 0200 7/11: INR - 2.97- warfarin held 7/12: INR - 1.92 - warfarin 10 mg 7/13: INR - 1.95 - warfarin 10 mg 7/14: INR - 2.56 - warfarin 5 mg 7/15: INR - 2.73 - warfarin 5 mg 7/16: INR - 2.39 - warfarin 7.5 mg 7/17: INR - 2.22 - warfarin 7.5 mg 7/18: INR - 2.53 - warfarin 7.5 mg 7/19: INR - 2.75 - warfarin 7.5 mg 7/20: INR - 2.99 - warfarin 6.5 mg 7/21: INR - 2.64 - warfarin 6.5 mg  7/22: INR: 2.44; warfarin 8  7/23: INR: 2.60;  Warfarin 10mg  7/24: INR 2.44 - warfarin 10mg  7/25:  INR 3.22 - warfarin 10mg  7/26:  INR 3.08 - warfarin 8mg  7/27:  INR 3.24 - warfarin 8 mg 7/28:  INR 2.79 - 8 mg 7/29:  INR 2.74- 8 mg 7/30:  INR 3.19 - 7 mg 7/31:  INR 2.73   7.5mg  8/1:    INR 3.13  7.5mg  8/2:    INR 2.74  7.5mg  8/3     INR 2.27  7.5mg  8/4     INR 3.19  8 mg 8/5     INR 3.04  7.5 mg 8/6     INR 3.05  7.5mg  8/7:    INR  3.22  6mg  8/8:    INR  3.85  Dose held per protocol 8/9:    INR 2.4     6mg  8/10:  INR 1.93   7mg   8/11:  INR  2.00  7mg  8/12:  INR  2.26  6 mg 8/13:  INR 2.22  Goal of Therapy:  INR 2-3 Monitor platelets by anticoagulation protocol: Yes   Plan:  INR is therapeutic today. Will order warfarin 7 mg daily and f/u AM INR.   (Patient with orders for carbamazepine which interacts with warfarin to decrease the INR. Patient has orders for fenofibrate which interacts with warfarin to increase the INR. Will follow INR closely.)   Pt will need CBC q3 days per policy.   Pharmacy will continue to follow.   Cameron [name], [facility] Clinical Pharmacist 01/14/2016,9:10 AM

## 2016-01-14 NOTE — Progress Notes (Signed)
Patient pleasant but very talkative and sometimes intrusive. Noted with a rapid pressured speech. He was seen walking back and forth the halls, talking and interacting with staff and peers. Remained medication compliant. Attended some groups. He denies SI/HI/AVH and contracts for safety.

## 2016-01-14 NOTE — [facility] Group Notes (Signed)
[facility] Group Notes:  (Nursing/MHT/Case Management/Adjunct)  Date:  01/14/2016  Time:  12:35 PM  Type of Therapy:  Psychoeducational Skills  Participation Level:  Active  Participation Quality:  Appropriate, Attentive and Sharing  Affect:  Appropriate  Cognitive:  Alert and Appropriate  Insight:  Appropriate  Engagement in Group:  Engaged  Modes of Intervention:  Discussion, Education and Support  Summary of Progress/Problems:  Cameron [name] [facility] 01/14/2016, 12:35 PM

## 2016-01-14 NOTE — Progress Notes (Signed)
Hypoglycemic Event  CBG: 50  Treatment: OJ and graham crackers, recheck  Symptoms: irritable  Follow-up CBG: [id number] CBG Result: 77  Possible Reasons for Event: Evening insulin dose  Comments/MD notified: [facility] paged/on coming shift notified.    Cameron [name]

## 2016-01-15 ENCOUNTER — Inpatient Hospital Stay: Payer: 59 | Admitting: Anesthesiology

## 2016-01-15 LAB — CBC
HCT: 35.1 % — ABNORMAL LOW (ref 40.0–52.0)
Hemoglobin: 11.7 g/dL — ABNORMAL LOW (ref 13.0–18.0)
MCH: 28.7 pg (ref 26.0–34.0)
MCHC: 33.3 g/dL (ref 32.0–36.0)
MCV: 86.1 fL (ref 80.0–100.0)
Platelets: 215 10*3/uL (ref 150–440)
RBC: 4.07 MIL/uL — ABNORMAL LOW (ref 4.40–5.90)
RDW: 13 % (ref 11.5–14.5)
WBC: 2.6 10*3/uL — ABNORMAL LOW (ref 3.8–10.6)

## 2016-01-15 LAB — PROTIME-INR
INR: 2.18
Prothrombin Time: 24.6 seconds — ABNORMAL HIGH (ref 11.4–15.2)

## 2016-01-15 LAB — GLUCOSE, CAPILLARY
GLUCOSE-CAPILLARY: 64 mg/dL — AB (ref 65–99)
GLUCOSE-CAPILLARY: 135 mg/dL — AB (ref 65–99)
Glucose-Capillary: 51 mg/dL — ABNORMAL LOW (ref 65–99)
Glucose-Capillary: 118 mg/dL — ABNORMAL HIGH (ref 65–99)

## 2016-01-15 MED ORDER — SODIUM CHLORIDE 0.9 % IV SOLN
250.0000 mL | Freq: Once | INTRAVENOUS | Status: AC
  Administered 2016-01-15: 11:00:00 via INTRAVENOUS

## 2016-01-15 MED ORDER — LABETALOL HCL 5 MG/ML IV SOLN
INTRAVENOUS | Status: DC | PRN
  Administered 2016-01-15: 5 mg via INTRAVENOUS

## 2016-01-15 MED ORDER — KETOROLAC TROMETHAMINE 30 MG/ML IJ SOLN
30.0000 mg | Freq: Once | INTRAMUSCULAR | Status: AC
  Administered 2016-01-15: 30 mg via INTRAVENOUS

## 2016-01-15 MED ORDER — INSULIN GLARGINE 100 UNIT/ML ~~LOC~~ SOLN
30.0000 [IU] | Freq: Every day | SUBCUTANEOUS | Status: DC
  Administered 2016-01-15 – 2016-01-22 (×8): 30 [IU] via SUBCUTANEOUS
  Administered 2016-01-23: 15 [IU] via SUBCUTANEOUS
  Administered 2016-01-24 – 2016-01-31 (×8): 30 [IU] via SUBCUTANEOUS
  Filled 2016-01-15 (×18): qty 0.3

## 2016-01-15 MED ORDER — KETOROLAC TROMETHAMINE 30 MG/ML IJ SOLN
INTRAMUSCULAR | Status: AC
  Administered 2016-01-15: 30 mg via INTRAVENOUS
  Filled 2016-01-15: qty 1

## 2016-01-15 MED ORDER — ONDANSETRON HCL 4 MG/2ML IJ SOLN
INTRAMUSCULAR | Status: AC
Start: 2016-01-15 — End: 2016-01-15
  Administered 2016-01-15: 4 mg via INTRAVENOUS
  Filled 2016-01-15: qty 2

## 2016-01-15 MED ORDER — ONDANSETRON HCL 4 MG/2ML IJ SOLN
4.0000 mg | Freq: Once | INTRAMUSCULAR | Status: AC
  Administered 2016-01-15: 4 mg via INTRAVENOUS

## 2016-01-15 MED ORDER — METHOHEXITAL SODIUM 100 MG/10ML IV SOSY
PREFILLED_SYRINGE | INTRAVENOUS | Status: DC | PRN
  Administered 2016-01-15: 100 mg via INTRAVENOUS

## 2016-01-15 MED ORDER — SUCCINYLCHOLINE CHLORIDE 20 MG/ML IJ SOLN
INTRAMUSCULAR | Status: DC | PRN
  Administered 2016-01-15: 150 mg via INTRAVENOUS

## 2016-01-15 MED ORDER — GLUCAGON HCL RDNA (DIAGNOSTIC) 1 MG IJ SOLR
1.0000 mg | Freq: Once | INTRAMUSCULAR | Status: DC | PRN
  Filled 2016-01-15: qty 1

## 2016-01-15 MED ORDER — MIDAZOLAM HCL 2 MG/2ML IJ SOLN
4.0000 mg | Freq: Once | INTRAMUSCULAR | Status: AC
  Administered 2016-01-15: 2 mg via INTRAVENOUS

## 2016-01-15 NOTE — Progress Notes (Signed)
[facility] MD Progress Note  01/15/2016 7:22 PM [name]  MRN:  [id number] Subjective:    Follow-up for Monday the 14th. Patient had ECT today and tolerated it well. He remains very talkative and very tangential and manic this afternoon. Not violent. No suicidal or homicidal ideation..  Principal Problem: Bipolar I disorder, current or most recent episode manic, with psychotic features ([place]) Diagnosis:   Patient Active Problem List   Diagnosis Date Noted  . Bipolar affective disorder, current episode manic ([place]) [F31.9] 01/04/2016  . Cellulitis of leg, right [L03.115] 01/01/2016  . Bipolar I disorder, current or most recent episode manic, with psychotic features ([place]) [F31.2] 12/11/2015  . Pulmonary embolism ([place]) [I26.99] 12/04/2015  . Diabetes mellitus without complication ([place]) [id number]   . Hypercholesteremia [E78.00]   . CKD (chronic kidney disease), stage III [N18.3]   . Acute deep vein thrombosis (DVT) of femoral vein of right lower extremity (HCC) [I82.411]    Total Time spent with patient: 20 minutes  Past Psychiatric History: Long history of bipolar disorder multiple manic episodes. History of response to ECT.  Past Medical History:  Past Medical History:  Diagnosis Date  . Bipolar 1 disorder ([place])   . CKD (chronic kidney disease), stage III   . Diabetes mellitus without complication ([place])   . DVT (deep venous thrombosis) ([place])   . Hypercholesteremia   . Hypertension     Past Surgical History:  Procedure Laterality Date  . APPENDECTOMY     Family History:  Family History  Problem Relation Age of Onset  . Stroke Other   . Diabetes Other    Family Psychiatric  History: Positive for bipolar disorder Social History:  History  Alcohol Use  . Yes    Comment: rarely     History  Drug Use No    Social History   Social History  . Marital status: Married    Spouse name: N/A  . Number of children: N/A  . Years of education: N/A   Social History Main Topics    . Smoking status: Never Smoker  . Smokeless tobacco: Never Used  . Alcohol use Yes     Comment: rarely  . Drug use: No  . Sexual activity: Not Asked   Other Topics Concern  . None   Social History Narrative  . None   Additional Social History:                         Sleep: Fair  Appetite:  Fair  Current Medications: Current Facility-Administered Medications  Medication Dose Route Frequency Provider Last Rate Last Dose  . acetaminophen (TYLENOL) tablet 650 mg  650 mg Oral Q6H PRN Gonzella Lex, MD   650 mg at 01/13/16 0901  . alum & mag hydroxide-simeth (MAALOX/MYLANTA) 200-200-20 MG/5ML suspension 30 mL  30 mL Oral Q4H PRN Gonzella Lex, MD      . atorvastatin (LIPITOR) tablet 40 mg  40 mg Oral q1800 Gonzella Lex, MD   40 mg at 01/15/16 1724  . carbamazepine (TEGRETOL) tablet 1,200 mg  1,200 mg Oral BID PC Gonzella Lex, MD   1,200 mg at 01/15/16 1724  . enalapril (VASOTEC) tablet 5 mg  5 mg Oral BID Gonzella Lex, MD   5 mg at 01/15/16 0811  . fenofibrate tablet 160 mg  160 mg Oral Daily Gonzella Lex, MD   160 mg at 01/15/16 1243  . glucagon (human recombinant) (GLUCAGEN)  injection 1 mg  1 mg Intramuscular Once PRN Gonzella Lex, MD      . haloperidol lactate (HALDOL) injection 20 mg  20 mg Intramuscular Q6H PRN Gonzella Lex, MD      . insulin glargine (LANTUS) injection 45 Units  45 Units Subcutaneous QHS Gonzella Lex, MD   45 Units at 01/14/16 2226  . lamoTRIgine (LAMICTAL) tablet 200 mg  200 mg Oral Daily Gonzella Lex, MD   200 mg at 01/15/16 1243  . lithium carbonate capsule 300 mg  300 mg Oral QHS Gonzella Lex, MD   300 mg at 01/14/16 2206  . LORazepam (ATIVAN) injection 2 mg  2 mg Intramuscular Q6H PRN Gonzella Lex, MD      . LORazepam (ATIVAN) tablet 1 mg  1 mg Oral Q4H PRN Rainey Pines, MD   1 mg at 01/12/16 0440  . magnesium hydroxide (MILK OF MAGNESIA) suspension 30 mL  30 mL Oral Daily PRN Gonzella Lex, MD      . QUEtiapine (SEROQUEL)  tablet 1,400 mg  1,400 mg Oral QHS Gonzella Lex, MD   1,400 mg at 01/14/16 2206  . temazepam (RESTORIL) capsule 15 mg  15 mg Oral QHS Gonzella Lex, MD   15 mg at 01/14/16 2206  . warfarin (COUMADIN) tablet 7 mg  7 mg Oral q1800 Gonzella Lex, MD   7 mg at 01/15/16 1724  . Warfarin - Pharmacist Dosing Inpatient   Does not apply q1800 Gonzella Lex, MD        Lab Results:  Results for orders placed or performed during the hospital encounter of 01/04/16 (from the past 48 hour(s))  Glucose, capillary     Status: None   Collection Time: 01/13/16  9:15 PM  Result Value Ref Range   Glucose-Capillary 78 65 - 99 mg/dL  Glucose, capillary     Status: Abnormal   Collection Time: 01/13/16 10:01 PM  Result Value Ref Range   Glucose-Capillary 150 (H) 65 - 99 mg/dL  Glucose, capillary     Status: Abnormal   Collection Time: 01/14/16  6:32 AM  Result Value Ref Range   Glucose-Capillary 50 (L) 65 - 99 mg/dL  Protime-INR     Status: Abnormal   Collection Time: 01/14/16  6:53 AM  Result Value Ref Range   Prothrombin Time 25.0 (H) 11.4 - 15.2 seconds   INR 2.22   CBC     Status: Abnormal   Collection Time: 01/14/16  6:53 AM  Result Value Ref Range   WBC 2.8 (L) 3.8 - 10.6 K/uL   RBC 4.14 (L) 4.40 - 5.90 MIL/uL   Hemoglobin 11.9 (L) 13.0 - 18.0 g/dL   HCT 35.4 (L) 40.0 - 52.0 %   MCV 85.4 80.0 - 100.0 fL   MCH 28.7 26.0 - 34.0 pg   MCHC 33.6 32.0 - 36.0 g/dL   RDW 12.8 11.5 - 14.5 %   Platelets 222 150 - 440 K/uL  Glucose, capillary     Status: None   Collection Time: 01/14/16  6:56 AM  Result Value Ref Range   Glucose-Capillary 77 65 - 99 mg/dL   Comment 1 Notify RN   Glucose, capillary     Status: Abnormal   Collection Time: 01/14/16 12:11 PM  Result Value Ref Range   Glucose-Capillary 107 (H) 65 - 99 mg/dL  Glucose, capillary     Status: None   Collection Time: 01/14/16  4:12 PM  Result Value Ref Range   Glucose-Capillary 67 65 - 99 mg/dL  Glucose, capillary     Status: None    Collection Time: 01/14/16  9:21 PM  Result Value Ref Range   Glucose-Capillary 72 65 - 99 mg/dL  Glucose, capillary     Status: Abnormal   Collection Time: 01/14/16 10:13 PM  Result Value Ref Range   Glucose-Capillary 143 (H) 65 - 99 mg/dL  Glucose, capillary     Status: Abnormal   Collection Time: 01/15/16  6:20 AM  Result Value Ref Range   Glucose-Capillary 51 (L) 65 - 99 mg/dL  Protime-INR     Status: Abnormal   Collection Time: 01/15/16  6:33 AM  Result Value Ref Range   Prothrombin Time 24.6 (H) 11.4 - 15.2 seconds   INR 2.18   CBC     Status: Abnormal   Collection Time: 01/15/16  6:33 AM  Result Value Ref Range   WBC 2.6 (L) 3.8 - 10.6 K/uL   RBC 4.07 (L) 4.40 - 5.90 MIL/uL   Hemoglobin 11.7 (L) 13.0 - 18.0 g/dL   HCT 35.1 (L) 40.0 - 52.0 %   MCV 86.1 80.0 - 100.0 fL   MCH 28.7 26.0 - 34.0 pg   MCHC 33.3 32.0 - 36.0 g/dL   RDW 13.0 11.5 - 14.5 %   Platelets 215 150 - 440 K/uL  Glucose, capillary     Status: Abnormal   Collection Time: 01/15/16  8:34 AM  Result Value Ref Range   Glucose-Capillary 64 (L) 65 - 99 mg/dL  Glucose, capillary     Status: Abnormal   Collection Time: 01/15/16  4:39 PM  Result Value Ref Range   Glucose-Capillary 118 (H) 65 - 99 mg/dL    Blood Alcohol level:  Lab Results  Component Value Date   ETH <5 [id number]    Metabolic Disorder Labs: Lab Results  Component Value Date   HGBA1C 7.4 (H) 01/02/2016   MPG 192 12/04/2015   MPG 318 (H) 08/24/2010   Lab Results  Component Value Date   PROLACTIN 4.4 12/11/2015   Lab Results  Component Value Date   CHOL 143 12/11/2015   TRIG 286 (H) 12/11/2015   HDL 32 (L) 12/11/2015   CHOLHDL 4.5 12/11/2015   VLDL 57 (H) 12/11/2015   LDLCALC 54 12/11/2015   LDLCALC 56 12/05/2015    Physical Findings: AIMS: Facial and Oral Movements Muscles of Facial Expression: None, normal Lips and Perioral Area: None, normal Jaw: None, normal Tongue: None, normal,Extremity Movements Upper (arms,  wrists, hands, fingers): None, normal Lower (legs, knees, ankles, toes): None, normal, Trunk Movements Neck, shoulders, hips: None, normal, Overall Severity Severity of abnormal movements (highest score from questions above): None, normal Incapacitation due to abnormal movements: None, normal Patient's awareness of abnormal movements (rate only patient's report): No Awareness, Dental Status Current problems with teeth and/or dentures?: No Does patient usually wear dentures?: No  CIWA:    COWS:     Musculoskeletal: Strength & Muscle Tone: within normal limits Gait & Station: normal Patient leans: N/A  Psychiatric Specialty Exam: Physical Exam  Nursing note and vitals reviewed. Constitutional: He appears well-developed and well-nourished.  HENT:  Head: Normocephalic and atraumatic.  Eyes: Conjunctivae are normal. Pupils are equal, round, and reactive to light.  Neck: Normal range of motion.  Cardiovascular: Normal heart sounds.   Respiratory: Effort normal. No respiratory distress.  GI: Soft.  Musculoskeletal: Normal range of motion.  Neurological: He is alert.  Skin:  Skin is warm and dry.     Psychiatric: His affect is labile and inappropriate. His speech is tangential. He is hyperactive. Thought content is delusional. He expresses impulsivity. He expresses no homicidal and no suicidal ideation. He exhibits abnormal recent memory. He is inattentive.    Review of Systems  Constitutional: Negative.  Negative for chills, fever, malaise/fatigue and weight loss.  HENT: Negative.  Negative for hearing loss, nosebleeds, sore throat and tinnitus.   Eyes: Negative.  Negative for blurred vision and double vision.  Respiratory: Negative.  Negative for cough, hemoptysis, sputum production, shortness of breath and wheezing.   Cardiovascular: Negative.  Negative for chest pain, palpitations and claudication.  Gastrointestinal: Negative.  Negative for abdominal pain, blood in stool,  constipation, diarrhea, heartburn, nausea and vomiting.  Genitourinary: Negative for dysuria, frequency and urgency.  Musculoskeletal: Negative.  Negative for back pain, falls, joint pain, myalgias and neck pain.  Skin: Negative.  Negative for itching and rash.  Neurological: Negative.  Negative for dizziness, tingling, tremors, focal weakness, seizures and headaches.  Endo/Heme/Allergies: Negative for environmental allergies. Does not bruise/bleed easily.  Psychiatric/Behavioral: Negative for depression, hallucinations, memory loss, substance abuse and suicidal ideas. The patient is not nervous/anxious and does not have insomnia.     Blood pressure 112/72, pulse 74, temperature 97.5 F (36.4 C), resp. rate 18, height 6' (1.829 m), weight 129.7 kg (286 lb), SpO2 100 %.Body mass index is 38.79 kg/m.  General Appearance: Disheveled  Eye Contact:  Fair  Speech:  Pressured  Volume:  Increased  Mood:  Euphoric  Affect:  Labile  Thought Process:  Disorganized  Orientation:  Full (Time, Place, and Person)  Thought Content:  Illogical, Delusions, Rumination and Tangential  Suicidal Thoughts:  No  Homicidal Thoughts:  No  Memory:  Immediate;   Fair Recent;   Poor Remote;   Fair  Judgement:  Impaired  Insight:  Shallow  Psychomotor Activity:  Increased  Concentration:  Concentration: Poor  Recall:  [place] of Knowledge:  Good  Language:  Good  Akathisia:  No  Handed:  Right  AIMS (if indicated):     Assets:  Financial Resources/Insurance Housing Social Support  ADL's:  Impaired  Cognition:  Impaired,  Mild  Sleep:  Number of Hours: 6     Treatment Plan Summary:  Cameron [name] 50 year old married Caucasian male with a history of bipolar disorder who was admitted to the hospital for ECT treatment for mania. He was just transferred down to the medicine floor after treatment for cellulitis of the leg.  Bipolar disorder: Most recent episode manic: Patient has had ECT treatments, 8  of them at this point. He avoided having treatment today by eating. I counseled him this afternoon that I think he is clearly been showing improvement and is not having any side effects and should continue with ECT. I would like putting him on the schedule for Monday. As usual he is against this although his rationality is poor. I talked with Dr. Reuel Derby office today and reviewed how this is a typical presentation for the patient.  I am going to increase his Seroquel tonight to 1400 mg. He is tolerating the high dose without any side effects.   Hyperlipidemia: We'll plan to continue Lipitor 40 mg by mouth daily and fenofibrate 160 mg by mouth daily  Cellulitis of the leg: Will continue Augmentin 1 tablet by mouth twice a day. Cellulitis is slowly improving.  History of pulmonary embolism: We'll continue Coumadin 8 mg  by mouth nightly.  Disposition: The patient will be discharged home with his wife because he does have a stable living situation. Psychotropic medication management follow-up appointment as well as individual therapy will be scheduled with Dr. Thurmond Butts. I had a nice talk with Dr. Thurmond Butts today. We reviewed the patient's history of nearly intractable mania. He recommends and I agree with the plan to try to continue ECT as long as tolerated. No change to medicine today except I will continue to decrease his insulin since he is running low blood sugars. Alethia Berthold, MD 01/15/2016, 7:22 PM

## 2016-01-15 NOTE — H&P (Signed)
Cameron [name] is an 50 y.o. male.   Chief Complaint: No specific new complaints today HPI: Patient has bipolar disorder has been manic now for over a month. Partial improvement.  Past Medical History:  Diagnosis Date  . Bipolar 1 disorder ([place])   . CKD (chronic kidney disease), stage III   . Diabetes mellitus without complication ([place])   . DVT (deep venous thrombosis) ([place])   . Hypercholesteremia   . Hypertension     Past Surgical History:  Procedure Laterality Date  . APPENDECTOMY      Family History  Problem Relation Age of Onset  . Stroke Other   . Diabetes Other    Social History:  reports that he has never smoked. He has never used smokeless tobacco. He reports that he drinks alcohol. He reports that he does not use drugs.  Allergies:  Allergies  Allergen Reactions  . Asa [Aspirin] Other (See Comments)    Patient tolerates LOW DOSE ASPIRIN.    Medications Prior to Admission  Medication Sig Dispense Refill  . amoxicillin-clavulanate (AUGMENTIN) 875-125 MG tablet Take 1 tablet by mouth 2 (two) times daily. X 7 more days 14 tablet 0  . ARIPiprazole (ABILIFY) 30 MG tablet Take 30 mg by mouth every evening.     Marland Kitchen atorvastatin (LIPITOR) 40 MG tablet Take 40 mg by mouth daily.  11  . carbamazepine (TEGRETOL) 200 MG tablet Take 2 tablets (400 mg total) by mouth every morning. 30 tablet 0  . carbamazepine (TEGRETOL) 200 MG tablet Take 3 tablets (600 mg total) by mouth every evening. 30 tablet 0  . enalapril (VASOTEC) 5 MG tablet Take 5 mg by mouth 2 (two) times daily.    . fenofibrate 160 MG tablet Take 160 mg by mouth daily.    . insulin aspart (NOVOLOG) 100 UNIT/ML injection Inject 0-9 Units into the skin 3 (three) times daily with meals. 10 mL 11  . insulin aspart (NOVOLOG) 100 UNIT/ML injection Inject 0-5 Units into the skin at bedtime. 10 mL 11  . Insulin Glargine (LANTUS SOLOSTAR) 100 UNIT/ML Solostar Pen Inject 60 Units into the skin daily.     Marland Kitchen lamoTRIgine  (LAMICTAL) 100 MG tablet Take 400 mg by mouth at bedtime.    . Multiple Vitamin (MULTIVITAMIN WITH MINERALS) TABS tablet Take 1 tablet by mouth daily.    . QUEtiapine (SEROQUEL) 400 MG tablet Take 3 tablets (1,200 mg total) by mouth at bedtime. 90 tablet 0  . TANZEUM 50 MG PEN Inject 50 mg into the skin every 7 (seven) days.  2  . temazepam (RESTORIL) 15 MG capsule Take 15 mg by mouth at bedtime as needed for sleep.    Marland Kitchen warfarin (COUMADIN) 7.5 MG tablet Take 1 tablet (7.5 mg total) by mouth daily at 6 PM. 30 tablet 0    Results for orders placed or performed during the hospital encounter of 01/04/16 (from the past 48 hour(s))  Glucose, capillary     Status: Abnormal   Collection Time: 01/13/16 11:25 AM  Result Value Ref Range   Glucose-Capillary 119 (H) 65 - 99 mg/dL   Comment 1 Notify RN   Glucose, capillary     Status: None   Collection Time: 01/13/16  4:15 PM  Result Value Ref Range   Glucose-Capillary 85 65 - 99 mg/dL   Comment 1 Notify RN   Glucose, capillary     Status: None   Collection Time: 01/13/16  9:15 PM  Result Value Ref Range  Glucose-Capillary 78 65 - 99 mg/dL  Glucose, capillary     Status: Abnormal   Collection Time: 01/13/16 10:01 PM  Result Value Ref Range   Glucose-Capillary 150 (H) 65 - 99 mg/dL  Glucose, capillary     Status: Abnormal   Collection Time: 01/14/16  6:32 AM  Result Value Ref Range   Glucose-Capillary 50 (L) 65 - 99 mg/dL  Protime-INR     Status: Abnormal   Collection Time: 01/14/16  6:53 AM  Result Value Ref Range   Prothrombin Time 25.0 (H) 11.4 - 15.2 seconds   INR 2.22   CBC     Status: Abnormal   Collection Time: 01/14/16  6:53 AM  Result Value Ref Range   WBC 2.8 (L) 3.8 - 10.6 K/uL   RBC 4.14 (L) 4.40 - 5.90 MIL/uL   Hemoglobin 11.9 (L) 13.0 - 18.0 g/dL   HCT 35.4 (L) 40.0 - 52.0 %   MCV 85.4 80.0 - 100.0 fL   MCH 28.7 26.0 - 34.0 pg   MCHC 33.6 32.0 - 36.0 g/dL   RDW 12.8 11.5 - 14.5 %   Platelets 222 150 - 440 K/uL   Glucose, capillary     Status: None   Collection Time: 01/14/16  6:56 AM  Result Value Ref Range   Glucose-Capillary 77 65 - 99 mg/dL   Comment 1 Notify RN   Glucose, capillary     Status: Abnormal   Collection Time: 01/14/16 12:11 PM  Result Value Ref Range   Glucose-Capillary 107 (H) 65 - 99 mg/dL  Glucose, capillary     Status: None   Collection Time: 01/14/16  4:12 PM  Result Value Ref Range   Glucose-Capillary 67 65 - 99 mg/dL  Glucose, capillary     Status: None   Collection Time: 01/14/16  9:21 PM  Result Value Ref Range   Glucose-Capillary 72 65 - 99 mg/dL  Glucose, capillary     Status: Abnormal   Collection Time: 01/14/16 10:13 PM  Result Value Ref Range   Glucose-Capillary 143 (H) 65 - 99 mg/dL  Glucose, capillary     Status: Abnormal   Collection Time: 01/15/16  6:20 AM  Result Value Ref Range   Glucose-Capillary 51 (L) 65 - 99 mg/dL  Protime-INR     Status: Abnormal   Collection Time: 01/15/16  6:33 AM  Result Value Ref Range   Prothrombin Time 24.6 (H) 11.4 - 15.2 seconds   INR 2.18   CBC     Status: Abnormal   Collection Time: 01/15/16  6:33 AM  Result Value Ref Range   WBC 2.6 (L) 3.8 - 10.6 K/uL   RBC 4.07 (L) 4.40 - 5.90 MIL/uL   Hemoglobin 11.7 (L) 13.0 - 18.0 g/dL   HCT 35.1 (L) 40.0 - 52.0 %   MCV 86.1 80.0 - 100.0 fL   MCH 28.7 26.0 - 34.0 pg   MCHC 33.3 32.0 - 36.0 g/dL   RDW 13.0 11.5 - 14.5 %   Platelets 215 150 - 440 K/uL  Glucose, capillary     Status: Abnormal   Collection Time: 01/15/16  8:34 AM  Result Value Ref Range   Glucose-Capillary 64 (L) 65 - 99 mg/dL   No results found.  Review of Systems  Constitutional: Negative.   HENT: Negative.   Eyes: Negative.   Respiratory: Negative.   Cardiovascular: Negative.   Gastrointestinal: Negative.   Musculoskeletal: Negative.   Skin: Negative.   Neurological: Negative.   Psychiatric/Behavioral:  Positive for memory loss. Negative for depression, hallucinations, substance abuse and  suicidal ideas. The patient is nervous/anxious and has insomnia.     Blood pressure 128/73, pulse 75, temperature 98.5 F (36.9 C), temperature source Oral, resp. rate 16, height 6' (1.829 m), weight 129.7 kg (286 lb), SpO2 100 %. Physical Exam  Nursing note and vitals reviewed. Constitutional: He appears well-developed and well-nourished.  HENT:  Head: Normocephalic and atraumatic.  Eyes: Conjunctivae are normal. Pupils are equal, round, and reactive to light.  Neck: Normal range of motion.  Cardiovascular: Regular rhythm and normal heart sounds.   Respiratory: Effort normal and breath sounds normal.  GI: Soft.  Musculoskeletal: Normal range of motion.  Neurological: He is alert.  Skin: Skin is warm and dry.  Psychiatric: His affect is labile and inappropriate. His speech is tangential. He is agitated. He expresses impulsivity. He expresses no suicidal ideation. He exhibits abnormal recent memory. He is inattentive.     Assessment/Plan Patient will continue receiving ECT as long as it is helping and we can try and get him better.  Alethia Berthold, MD 01/15/2016, 11:01 AM

## 2016-01-15 NOTE — Procedures (Signed)
ECT SERVICES Physician's Interval Evaluation & Treatment Note  Patient Identification: Cameron [name] MRN:  [id number] Date of Evaluation:  01/15/2016 TX #: [id number]  MADRS:   MMSE:   P.E. Findings:  No change vital stable did have a low blood sugar this morning  Psychiatric Interval Note:  Very manic agitated and disorganized  Subjective:  Patient is a 50 y.o. male seen for evaluation for Electroconvulsive Therapy. No insight to speak of.  Treatment Summary:   []   Right Unilateral             [x]  Bilateral   % Energy : 1.0 ms 100%   Impedance: 900 ohms  Seizure Energy Index: 525 V squared  Postictal Suppression Index: No reading  Seizure Concordance Index: No reading  Medications  Pre Shock: Toradol 30 mg Brevital 100 mg succinylcholine 150 mg  Post Shock: Versed 2 mg  Seizure Duration: He appeared to have a seizure although the seizure was very difficult to read on the readout. Probably only 2 seconds of motor seizure I would say probably up to 40 seconds of EEG   Comments: I would like to try switching him to ketamine next time   Lungs:  [x]   Clear to auscultation               []  Other:   Heart:    [x]   Regular rhythm             []  irregular rhythm    [x]   Previous H&P reviewed, patient examined and there are NO CHANGES                 []   Previous H&P reviewed, patient examined and there are changes noted.   Alethia Berthold, MD 8/14/201711:02 AM

## 2016-01-15 NOTE — Plan of Care (Signed)
Problem: Education: Goal: Emotional status will improve Outcome: Not Progressing Patient appears tearful and affect is sad.

## 2016-01-15 NOTE — Progress Notes (Signed)
ANTICOAGULATION CONSULT NOTE - Follow Up Consult  Pharmacy Consult for Warfarin Indication: VTE treatment  Allergies  Allergen Reactions  . Asa [Aspirin] Other (See Comments)    Patient tolerates LOW DOSE ASPIRIN.    Patient Measurements: Height: 6' (182.9 cm) Weight: 289 lb (131.1 kg) IBW/kg (Calculated) : 77.6  Vital Signs: Temp: 97.8 F (36.6 C) (08/14 0713) Temp Source: Oral (08/14 0713) BP: 115/74 (08/14 0713) Pulse Rate: 75 (08/14 0713)  Labs:  Recent Labs  01/13/16 0651 01/14/16 0653 01/15/16 0633  HGB  --  11.9* 11.7*  HCT  --  35.4* 35.1*  PLT  --  222 215  LABPROT 25.3* 25.0* 24.6*  INR 2.26 2.22 2.18   Estimated Creatinine Clearance: 76.5 mL/min (by C-G formula based on SCr of 1.6 mg/dL).  Medical History: Past Medical History:  Diagnosis Date  . Bipolar 1 disorder ([place])   . CKD (chronic kidney disease), stage III   . Diabetes mellitus without complication ([place])   . DVT (deep venous thrombosis) ([place])   . Hypercholesteremia   . Hypertension    Medications:  Warfarin   Assessment: 21 yom admitted to ED BHU with manic symptoms. Recently discharged from [facility] with DVT/PE, had been bridging LMWH and VKA prior to admission.  INR History: 7/9: INR - 1.99 7/10: no INR- warfarin 12.5 mg po daily at 0200 7/11: INR - 2.97- warfarin held 7/12: INR - 1.92 - warfarin 10 mg 7/13: INR - 1.95 - warfarin 10 mg 7/14: INR - 2.56 - warfarin 5 mg 7/15: INR - 2.73 - warfarin 5 mg 7/16: INR - 2.39 - warfarin 7.5 mg 7/17: INR - 2.22 - warfarin 7.5 mg 7/18: INR - 2.53 - warfarin 7.5 mg 7/19: INR - 2.75 - warfarin 7.5 mg 7/20: INR - 2.99 - warfarin 6.5 mg 7/21: INR - 2.64 - warfarin 6.5 mg  7/22: INR: 2.44; warfarin 8  7/23: INR: 2.60;  Warfarin 10mg  7/24: INR 2.44 - warfarin 10mg  7/25:  INR 3.22 - warfarin 10mg  7/26:  INR 3.08 - warfarin 8mg  7/27:  INR 3.24 - warfarin 8 mg 7/28:  INR 2.79 - 8 mg 7/29:  INR 2.74- 8 mg 7/30:  INR 3.19 - 7 mg 7/31:  INR 2.73   7.5mg  8/1:    INR 3.13  7.5mg  8/2:    INR 2.74  7.5mg  8/3     INR 2.27  7.5mg  8/4     INR 3.19  8 mg 8/5     INR 3.04  7.5 mg 8/6     INR 3.05  7.5mg  8/7:    INR  3.22  6mg  8/8:    INR  3.85  Dose held per protocol 8/9:    INR 2.4     6mg  8/10:  INR 1.93   7mg   8/11:  INR  2.00  7mg  8/12:  INR  2.26  6 mg 8/13:  INR 2.22   7 mg 8/14:  INR 2.18     Goal of Therapy:  INR 2-3 Monitor platelets by anticoagulation protocol: Yes   Plan:  INR is therapeutic today. Will continue warfarin 7 mg daily and f/u AM INR.   (Patient with orders for carbamazepine which interacts with warfarin to decrease the INR. Patient has orders for fenofibrate which interacts with warfarin to increase the INR. Will follow INR closely.)   Pt will need CBC q3 days per policy. Hgb and plt count today about stable from yesterday.   Pharmacy will continue to  follow.   Rocky Morel, [facility] Clinical Pharmacist 01/15/2016,8:13 AM

## 2016-01-15 NOTE — BHH Group Notes (Signed)
[place] Group Notes:  (Nursing/MHT/Case Management/Adjunct)  Date:  01/15/2016  Time:  3:03 AM  Type of Therapy:  Group Therapy  Participation Level:  Active  Participation Quality:  Intrusive and Redirectable  Affect:  Excited and Tearful  Cognitive:  Disorganized  Insight:  Lacking  Engagement in Group:  Poor  Modes of Intervention:  Discussion  Summary of Progress/Problems: Pt became tearful when fellow pt stated his plan of speaking to his pastor when he was discharged. Staff asked pt to explain why he was crying. Pt thoughts were very unorganized and hard to follow. Staff attempted to redirect pt by engaging him in conversation about his personal goal. Pt remained tearful for the rest of the group.   Jenetta Downer Prabhjot Piscitello 01/15/2016, 3:03 AM

## 2016-01-15 NOTE — Transfer of Care (Signed)
Immediate Anesthesia Transfer of Care Note  Patient: Cameron [name]  Procedure(s) Performed: * No procedures listed *  Patient Location: PACU  Anesthesia Type:General  Level of Consciousness: lethargic and responds to stimulation  Airway & Oxygen Therapy: Patient Spontanous Breathing and Patient connected to face mask oxygen  Post-op Assessment: Report given to RN and Post -op Vital signs reviewed and stable  Post vital signs: Reviewed and stable  Last Vitals:  Vitals:   01/15/16 1128 01/15/16 1132  BP: 136/79 138/78  Pulse: 87   Resp: (!) 21 (!) 22  Temp: 36.4 C     Last Pain:  Vitals:   01/15/16 1128  TempSrc:   PainSc: Asleep      Patients Stated Pain Goal: 1 ([id number])  Complications: No apparent anesthesia complications

## 2016-01-15 NOTE — Progress Notes (Signed)
Recreation Therapy Notes  Date: 08.14.17 Time: 9:30 am Location: Craft Room  Group Topic: Self-expression  Goal Area(s) Addresses:  Patient will identify one color per emotion listed on wheel. Patient will verbalize one emotion experienced during session. Patient will be educated on other forms of self-expression.  Behavioral Response: Did not attend  Intervention: Emotion Wheel  Activity: Patients were given an Emotion Wheel worksheet and instructed to pick a color for each emotion listed on the wheel.  Education: LRT educated patients on different forms of self-expression.  Education Outcome: Patient did not attend group.   Clinical Observations/Feedback: Patient did not attend group.  [name], LRT/CTRS 01/15/2016 10:20 AM

## 2016-01-15 NOTE — Progress Notes (Signed)
Patient was argumentative and not cooperative with preparations for ECT this morning. He refused to have Glucagon IM administered, he states that he preferred orange juice. Patient was explained to that he was NPO for ECT. He stated that he was done with ECT and he was ready to go home. Patient eventually agreed to go for treatment. After treatment, he remained cooperative. Attended groups, seen talking and interacting with staff and peers. He denies SI/HI/AVH and contracts for safety.

## 2016-01-15 NOTE — Progress Notes (Signed)
Patient came to nurses station with cup in hand. Patient was reminded he was not suppose to drink or eat anything. He stated, "too bad I've already drank a gallon."

## 2016-01-15 NOTE — Progress Notes (Signed)
Inpatient Diabetes Program Recommendations  AACE/ADA: New Consensus Statement on Inpatient Glycemic Control (2015)  Target Ranges:  Prepandial:   less than 140 mg/dL      Peak postprandial:   less than 180 mg/dL (1-2 hours)      Critically ill patients:  140 - 180 mg/dL   Results for LAJARVIS, FARLEIGH (MRN [id number]) as of 01/15/2016 11:29  Ref. Range 01/14/2016 06:32 01/14/2016 06:56 01/14/2016 12:11 01/14/2016 16:12 01/14/2016 21:21 01/14/2016 22:13 01/15/2016 06:20 01/15/2016 08:34  Glucose-Capillary Latest Ref Range: 65 - 99 mg/dL 50 (L) 77 107 (H) 67 72 143 (H) 51 (L) 64 (L)   Review of Glycemic Control  Diabetes history: DM2 Outpatient Diabetes medications: Lantus 60 units daily, Novolog 0-9 units TID with meals, Novolog 0-5 units QHS, Tanzeum 50 mg once a week Current orders for Inpatient glycemic control: Lantus 45 units QHS  Inpatient Diabetes Program Recommendations: Insulin - Basal: Noted Lantus was decreased from 60 to 45 units on 01/14/16. Fasting glucose 51 mg/dl today despite decrease in Lantus. Please consider decreasing Lantus further to 40 units QHS. Correction (SSI): May want to consider ordering Novolog sensitive correction scale TID with meals if needed.  Thanks, Barnie Alderman, RN, MSN, CDE Diabetes Coordinator Inpatient Diabetes Program [phone] (Team Pager from [place] to [place]) [phone] ([place] office) [phone] [facility] office) [phone] [facility] office)

## 2016-01-15 NOTE — Progress Notes (Signed)
CBG read 51. MD was notified and stated that they would give him D5 in ECT and not to give him anything to eat or drink. Glucagon IM was suggested and MD agreed. Patient refused and was very adament about getting juice and stated, "it's the principle of the matter." It continues to refuse glucogon injection and is very labile.

## 2016-01-15 NOTE — Anesthesia Preprocedure Evaluation (Signed)
Anesthesia Evaluation  Patient identified by MRN, date of birth, ID band Patient awake    Reviewed: Allergy & Precautions, NPO status , Patient's Chart, lab work & pertinent test results  History of Anesthesia Complications Negative for: history of anesthetic complications  Airway Mallampati: II  TM Distance: >3 FB Neck ROM: Full    Dental  (+) Poor Dentition, Chipped   Pulmonary neg COPD,    breath sounds clear to auscultation- rhonchi (-) wheezing      Cardiovascular hypertension, Pt. on medications (-) angina+ Peripheral Vascular Disease  (-) CAD and (-) Past MI  Rhythm:Regular Rate:Normal - Systolic murmurs and - Diastolic murmurs    Neuro/Psych PSYCHIATRIC DISORDERS negative neurological ROS     GI/Hepatic negative GI ROS, Neg liver ROS,   Endo/Other  diabetes, Well Controlled, Insulin Dependent  Renal/GU CRFRenal disease (baseline Cr 1.6)     Musculoskeletal negative musculoskeletal ROS (+)   Abdominal (+) + obese,   Peds  Hematology negative hematology ROS (+)   Anesthesia Other Findings Past Medical History: No date: Bipolar 1 disorder (HCC) No date: CKD (chronic kidney disease), stage III No date: Diabetes mellitus without complication (H[facility]) No date: DVT (deep venous thrombosis) (H[facility]) No date: Hypercholesteremia No date: Hypertension   Reproductive/Obstetrics                             Anesthesia Physical  Anesthesia Plan  ASA: III  Anesthesia Plan: General   Post-op Pain Management:    Induction: Intravenous  Airway Management Planned: Mask  Additional Equipment:   Intra-op Plan: Delibrate Circulatory arrest per surgeon request  Post-operative Plan:   Informed Consent: I have reviewed the patients History and Physical, chart, labs and discussed the procedure including the risks, benefits and alternatives for the proposed anesthesia with the patient or  authorized representative who has indicated his/her understanding and acceptance.     Plan Discussed with: Anesthesiologist and CRNA  Anesthesia Plan Comments:         Anesthesia Quick Evaluation

## 2016-01-15 NOTE — Anesthesia Postprocedure Evaluation (Signed)
Anesthesia Post Note  Patient: Cameron [name]  Procedure(s) Performed: * No procedures listed *  Patient location during evaluation: PACU Anesthesia Type: General Level of consciousness: awake and alert Pain management: pain level controlled Vital Signs Assessment: post-procedure vital signs reviewed and stable Respiratory status: spontaneous breathing, nonlabored ventilation, respiratory function stable and patient connected to nasal cannula oxygen Cardiovascular status: blood pressure returned to baseline and stable Postop Assessment: no signs of nausea or vomiting Anesthetic complications: no    Last Vitals:  Vitals:   01/15/16 1158 01/15/16 1213  BP: 122/79 112/72  Pulse: 79 74  Resp: 18 18  Temp:      Last Pain:  Vitals:   01/15/16 1158  TempSrc:   PainSc: 0-No pain                 Precious Haws Aleida Crandell

## 2016-01-15 NOTE — Progress Notes (Signed)
D:Patient still very tangential and hyperverbal. Patient was very tearful coming out of group. He appears sad. States he's going to do what he needs to do to get better. He denies SI/HI/AVH. He has been visible in the milieu interacting with staff and peers. Attended group. CBG 143 with insulin.  A: Medication was given with education. Encouragement was provided. Patient was educated to stay NPO after midnight R: Patient was compliant with medication. He has remained calm and cooperative. Safety maintained with 15 min checks

## 2016-01-16 ENCOUNTER — Other Ambulatory Visit: Payer: Self-pay | Admitting: Psychiatry

## 2016-01-16 LAB — GLUCOSE, CAPILLARY
GLUCOSE-CAPILLARY: 100 mg/dL — AB (ref 65–99)
GLUCOSE-CAPILLARY: 85 mg/dL (ref 65–99)
Glucose-Capillary: 118 mg/dL — ABNORMAL HIGH (ref 65–99)
Glucose-Capillary: 103 mg/dL — ABNORMAL HIGH (ref 65–99)

## 2016-01-16 LAB — PROTIME-INR
INR: 2.42
Prothrombin Time: 26.8 seconds — ABNORMAL HIGH (ref 11.4–15.2)

## 2016-01-16 NOTE — Plan of Care (Signed)
Problem: Safety: Goal: Ability to remain free from injury will improve Outcome: Progressing Patient able to stay focused on one topic  While in conversation with writer but remains manic

## 2016-01-16 NOTE — BHH Group Notes (Signed)
[place] LCSW Group Therapy  01/16/2016 4:55 PM  [place] LCSW Group Therapy Note  Date/Time  Type of Therapy/Topic:  Group Therapy:  Feelings about Diagnosis  Participation Level:  Good, sometimes requiring re-direction  Mood: Pleasant/Identifies good mood   Description of Group:    This group will allow patients to explore their thoughts and feelings about diagnoses they have received. Patients will be guided to explore their level of understanding and acceptance of these diagnoses. Facilitator will encourage patients to process their thoughts and feelings about the reactions of others to their diagnosis, and will guide patients in identifying ways to discuss their diagnosis with significant others in their lives. This group will be process-oriented, with patients participating in exploration of their own experiences as well as giving and receiving support and challenge from other group members.   Therapeutic Goals: 1. Patient will demonstrate understanding of diagnosis as evidence by identifying two or more symptoms of the disorder:  2. Patient will be able to express two feelings regarding the diagnosis 3. Patient will demonstrate ability to communicate their needs through discussion and/or role plays  Summary of Patient Progress:  Pt able to identify a lot about the impact his diagnosis has had on his life, he is aware of what he needs to do to maintain recovery.  Still requiring frequent re-direction to stay on topic as he remains hyper-verbal.  He responds to re-direction appropriately.    Therapeutic Modalities:   Cognitive Behavioral Therapy Brief Therapy Feelings Identification   August Saucer, MSW, LCSW 01/16/2016, 4:55 PM

## 2016-01-16 NOTE — BHH Group Notes (Signed)
[place] LCSW Group Therapy  01/16/2016 3:14 PM Goals Group  Type of Therapy and Topic: Group Therapy: Goals Group: SMART Goals   Participation Level: Good participation, requiring some redirection due to being hyper-verbal.  But responds to redirection well.   Description of Group:  The purpose of a daily goals group is to assist and guide patients in setting recovery/wellness-related goals. The objective is to set goals as they relate to the crisis in which they were admitted. Patients will be using SMART goal modalities to set measurable goals. Characteristics of realistic goals will be discussed and patients will be assisted in setting and processing how one will reach their goal. Facilitator will also assist patients in applying interventions and coping skills learned in psycho-education groups to the SMART goal and process how one will achieve defined goal.   Therapeutic Goals:  -Patients will develop and document one goal related to or their crisis in which brought them into treatment.  -Patients will be guided by LCSW using SMART goal setting modality in how to set a measurable, attainable, realistic and time sensitive goal.  -Patients will process barriers in reaching goal.  -Patients will process interventions in how to overcome and successful in reaching goal.   Patient's Goal:  Pt so disorganized it is difficult to gather what his goal is, but it seems related to learning more about himself and how to manage illness in a better way.  Pt expresses a lot of gratitude to support system and providers in his life.   Therapeutic Modalities:  Motivational Interviewing  Cognitive Behavioral Therapy  Crisis Intervention Model  SMART goals setting   August Saucer, MSW, LCSW 01/16/2016, 3:14 PM

## 2016-01-16 NOTE — Progress Notes (Signed)
Recreation Therapy Notes  Date: 08.15.17 Time: 3:00 pm Location: Craft Room  Group Topic: Goal Setting  Goal Area(s) Addresses:  Patient will write at least one goal. Patient will write at least one obstacle.  Behavioral Response: Arrived late, Attentive, Off topic  Intervention: Recovery Goal Chart  Activity: Patients were instructed to make a Recovery Goal Chart including goals, obstacles, the date they started working on their goals, and the date they achieved their goals.  Education: LRT educated patients on healthy ways to celebrate reaching their goals.  Education Outcome: In group clarification offered   Clinical Observations/Feedback: Patient arrived to group at approximately 3:25 pm. Patient contributed to group discussion but got off topic talking about computers and web sites and starting a blog.  [name], LRT/CTRS 01/16/2016 4:07 PM

## 2016-01-16 NOTE — Progress Notes (Signed)
D: Pt denies SI/HI/AVH, but noted responding to internal stimuli, Patient is hyper verbal, thoughts are disorganized. Patient was noted pacing back and forth speech tangential, with looose association noted. Pt appears anxious and he is not interacting with peers and staff appropriately.  A: Pt was offered support and encouragement. Pt was given scheduled medications. Pt was encouraged to attend groups. Q 15 minute checks were done for safety.  R:Pt did not attend group. Pt is taking medication. Pt has no complaints.Pt receptive to treatment and safety maintained on unit.

## 2016-01-16 NOTE — Progress Notes (Signed)
D: Patient continue to have manic behavior. At times intrusive.  Patient able to hold a conversation  With one topic   Inventory sheet  Neat , normal with no extra witting on it   Patient stated slept good last night .Stated appetite is good and energy level  Is normal. Stated concentration is good . Stated on Depression scale 0 , hopeless 0 and anxiety 0 .( low 0-10 high) Denies suicidal  homicidal ideations  .  No auditory hallucinations  No pain concerns . Appropriate ADL'S. Interacting with peers and staff.  A: Encourage patient participation with unit programming . Instruction  Given on  Medication , verbalize understanding. R: Voice no other concerns. Staff continue to monitor

## 2016-01-16 NOTE — Progress Notes (Addendum)
ANTICOAGULATION CONSULT NOTE - Follow Up Consult  Pharmacy Consult for Warfarin Indication: VTE treatment  Allergies  Allergen Reactions  . Asa [Aspirin] Other (See Comments)    Patient tolerates LOW DOSE ASPIRIN.    Patient Measurements: Height: 6' (182.9 cm) Weight: 286 lb (129.7 kg) IBW/kg (Calculated) : 77.6  Vital Signs: Temp: 98.3 F (36.8 C) (08/15 0500) BP: 121/77 (08/15 0500) Pulse Rate: 79 (08/15 0500)  Labs:  Recent Labs  01/14/16 0653 01/15/16 0633 01/16/16 0648  HGB 11.9* 11.7*  --   HCT 35.4* 35.1*  --   PLT 222 215  --   LABPROT 25.0* 24.6* 26.8*  INR 2.22 2.18 2.42   Estimated Creatinine Clearance: 76 mL/min (by C-G formula based on SCr of 1.6 mg/dL).  Medical History: Past Medical History:  Diagnosis Date  . Bipolar 1 disorder ([place])   . CKD (chronic kidney disease), stage III   . Diabetes mellitus without complication ([place])   . DVT (deep venous thrombosis) ([place])   . Hypercholesteremia   . Hypertension    Medications:  Warfarin   Assessment: 5 yom admitted to ED BHU with manic symptoms. Recently discharged from [facility] with DVT/PE, had been bridging LMWH and VKA prior to admission.  INR History: 7/9: INR - 1.99 7/10: no INR- warfarin 12.5 mg po daily at 0200 7/11: INR - 2.97- warfarin held 7/12: INR - 1.92 - warfarin 10 mg 7/13: INR - 1.95 - warfarin 10 mg 7/14: INR - 2.56 - warfarin 5 mg 7/15: INR - 2.73 - warfarin 5 mg 7/16: INR - 2.39 - warfarin 7.5 mg 7/17: INR - 2.22 - warfarin 7.5 mg 7/18: INR - 2.53 - warfarin 7.5 mg 7/19: INR - 2.75 - warfarin 7.5 mg 7/20: INR - 2.99 - warfarin 6.5 mg 7/21: INR - 2.64 - warfarin 6.5 mg  7/22: INR: 2.44; warfarin 8  7/23: INR: 2.60;  Warfarin 10mg  7/24: INR 2.44 - warfarin 10mg  7/25:  INR 3.22 - warfarin 10mg  7/26:  INR 3.08 - warfarin 8mg  7/27:  INR 3.24 - warfarin 8 mg 7/28:  INR 2.79 - 8 mg 7/29:  INR 2.74- 8 mg 7/30:  INR 3.19 - 7 mg 7/31:  INR 2.73  7.5mg  8/1:    INR 3.13   7.5mg  8/2:    INR 2.74  7.5mg  8/3     INR 2.27  7.5mg  8/4     INR 3.19  8 mg 8/5     INR 3.04  7.5 mg 8/6     INR 3.05  7.5mg  8/7:    INR  3.22  6mg  8/8:    INR  3.85  Dose held per protocol 8/9:    INR 2.4     6mg  8/10:  INR 1.93   7mg   8/11:  INR  2.00  7mg  8/12:  INR  2.26  6 mg 8/13:  INR 2.22   7 mg 8/14:  INR 2.18   7mg  8/15:  INR 2.42  Goal of Therapy:  INR 2-3 Monitor platelets by anticoagulation protocol: Yes   Plan:  INR is therapeutic today. Will continue warfarin 7 mg daily and f/u AM INR.   (Patient with orders for carbamazepine which interacts with warfarin to decrease the INR. Patient has orders for fenofibrate which interacts with warfarin to increase the INR. Will follow INR closely.)   Pt will need CBC q3 days per policy. Hgb and plt count today about stable from yesterday.   Pharmacy will continue to follow.  Cheri Guppy, [facility] Clinical Pharmacist 01/16/2016,11:26 AM

## 2016-01-16 NOTE — Progress Notes (Signed)
[facility] MD Progress Note  01/16/2016 7:57 PM Cameron [name]  MRN:  [id number] Subjective:    Follow-up for Tuesday the 15th. Patient has no new complaints. He is still disorganized in his speech with flight of ideas although I think today he was probably able to express a thought little bit more clearly than yesterday. Affect is mildly euphoric but not extremely bizarre.  Principal Problem: Bipolar I disorder, current or most recent episode manic, with psychotic features ([place]) Diagnosis:   Patient Active Problem List   Diagnosis Date Noted  . Bipolar affective disorder, current episode manic ([place]) [F31.9] 01/04/2016  . Cellulitis of leg, right [L03.115] 01/01/2016  . Bipolar I disorder, current or most recent episode manic, with psychotic features ([place]) [F31.2] 12/11/2015  . Pulmonary embolism ([place]) [I26.99] 12/04/2015  . Diabetes mellitus without complication ([place]) [id number]   . Hypercholesteremia [E78.00]   . CKD (chronic kidney disease), stage III [N18.3]   . Acute deep vein thrombosis (DVT) of femoral vein of right lower extremity (HCC) [I82.411]    Total Time spent with patient: 20 minutes  Past Psychiatric History: Long history of bipolar disorder multiple manic episodes. History of response to ECT.  Past Medical History:  Past Medical History:  Diagnosis Date  . Bipolar 1 disorder ([place])   . CKD (chronic kidney disease), stage III   . Diabetes mellitus without complication ([place])   . DVT (deep venous thrombosis) ([place])   . Hypercholesteremia   . Hypertension     Past Surgical History:  Procedure Laterality Date  . APPENDECTOMY     Family History:  Family History  Problem Relation Age of Onset  . Stroke Other   . Diabetes Other    Family Psychiatric  History: Positive for bipolar disorder Social History:  History  Alcohol Use  . Yes    Comment: rarely     History  Drug Use No    Social History   Social History  . Marital status: Married    Spouse name: N/A  .  Number of children: N/A  . Years of education: N/A   Social History Main Topics  . Smoking status: Never Smoker  . Smokeless tobacco: Never Used  . Alcohol use Yes     Comment: rarely  . Drug use: No  . Sexual activity: Not Asked   Other Topics Concern  . None   Social History Narrative  . None   Additional Social History:                         Sleep: Fair  Appetite:  Fair  Current Medications: Current Facility-Administered Medications  Medication Dose Route Frequency Provider Last Rate Last Dose  . acetaminophen (TYLENOL) tablet 650 mg  650 mg Oral Q6H PRN Gonzella Lex, MD   650 mg at 01/16/16 1307  . alum & mag hydroxide-simeth (MAALOX/MYLANTA) 200-200-20 MG/5ML suspension 30 mL  30 mL Oral Q4H PRN Gonzella Lex, MD      . atorvastatin (LIPITOR) tablet 40 mg  40 mg Oral q1800 Gonzella Lex, MD   40 mg at 01/15/16 1724  . carbamazepine (TEGRETOL) tablet 1,200 mg  1,200 mg Oral BID PC Gonzella Lex, MD   1,200 mg at 01/16/16 0819  . enalapril (VASOTEC) tablet 5 mg  5 mg Oral BID Gonzella Lex, MD   5 mg at 01/16/16 0820  . fenofibrate tablet 160 mg  160 mg Oral Daily Shakeya Kerkman T  Dandrea Medders, MD   160 mg at 01/16/16 0820  . glucagon (human recombinant) (GLUCAGEN) injection 1 mg  1 mg Intramuscular Once PRN Gonzella Lex, MD      . haloperidol lactate (HALDOL) injection 20 mg  20 mg Intramuscular Q6H PRN Gonzella Lex, MD      . insulin glargine (LANTUS) injection 30 Units  30 Units Subcutaneous QHS Gonzella Lex, MD   30 Units at 01/15/16 2226  . lamoTRIgine (LAMICTAL) tablet 200 mg  200 mg Oral Daily Gonzella Lex, MD   200 mg at 01/16/16 [id number]  . lithium carbonate capsule 300 mg  300 mg Oral QHS Gonzella Lex, MD   300 mg at 01/15/16 2223  . LORazepam (ATIVAN) injection 2 mg  2 mg Intramuscular Q6H PRN Gonzella Lex, MD      . LORazepam (ATIVAN) tablet 1 mg  1 mg Oral Q4H PRN Rainey Pines, MD   1 mg at 01/15/16 2223  . magnesium hydroxide (MILK OF MAGNESIA)  suspension 30 mL  30 mL Oral Daily PRN Gonzella Lex, MD      . QUEtiapine (SEROQUEL) tablet 1,400 mg  1,400 mg Oral QHS Gonzella Lex, MD   1,400 mg at 01/15/16 2222  . temazepam (RESTORIL) capsule 15 mg  15 mg Oral QHS Gonzella Lex, MD   15 mg at 01/15/16 2223  . warfarin (COUMADIN) tablet 7 mg  7 mg Oral q1800 Gonzella Lex, MD   7 mg at 01/15/16 1724  . Warfarin - Pharmacist Dosing Inpatient   Does not apply q1800 Gonzella Lex, MD        Lab Results:  Results for orders placed or performed during the hospital encounter of 01/04/16 (from the past 48 hour(s))  Glucose, capillary     Status: None   Collection Time: 01/14/16  9:21 PM  Result Value Ref Range   Glucose-Capillary 72 65 - 99 mg/dL  Glucose, capillary     Status: Abnormal   Collection Time: 01/14/16 10:13 PM  Result Value Ref Range   Glucose-Capillary 143 (H) 65 - 99 mg/dL  Glucose, capillary     Status: Abnormal   Collection Time: 01/15/16  6:20 AM  Result Value Ref Range   Glucose-Capillary 51 (L) 65 - 99 mg/dL  Protime-INR     Status: Abnormal   Collection Time: 01/15/16  6:33 AM  Result Value Ref Range   Prothrombin Time 24.6 (H) 11.4 - 15.2 seconds   INR 2.18   CBC     Status: Abnormal   Collection Time: 01/15/16  6:33 AM  Result Value Ref Range   WBC 2.6 (L) 3.8 - 10.6 K/uL   RBC 4.07 (L) 4.40 - 5.90 MIL/uL   Hemoglobin 11.7 (L) 13.0 - 18.0 g/dL   HCT 35.1 (L) 40.0 - 52.0 %   MCV 86.1 80.0 - 100.0 fL   MCH 28.7 26.0 - 34.0 pg   MCHC 33.3 32.0 - 36.0 g/dL   RDW 13.0 11.5 - 14.5 %   Platelets 215 150 - 440 K/uL  Glucose, capillary     Status: Abnormal   Collection Time: 01/15/16  8:34 AM  Result Value Ref Range   Glucose-Capillary 64 (L) 65 - 99 mg/dL  Glucose, capillary     Status: Abnormal   Collection Time: 01/15/16  4:39 PM  Result Value Ref Range   Glucose-Capillary 118 (H) 65 - 99 mg/dL  Glucose, capillary     Status:  Abnormal   Collection Time: 01/15/16  9:03 PM  Result Value Ref Range    Glucose-Capillary 135 (H) 65 - 99 mg/dL  Glucose, capillary     Status: Abnormal   Collection Time: 01/16/16  6:27 AM  Result Value Ref Range   Glucose-Capillary 100 (H) 65 - 99 mg/dL  Protime-INR     Status: Abnormal   Collection Time: 01/16/16  6:48 AM  Result Value Ref Range   Prothrombin Time 26.8 (H) 11.4 - 15.2 seconds   INR 2.42   Glucose, capillary     Status: Abnormal   Collection Time: 01/16/16 11:45 AM  Result Value Ref Range   Glucose-Capillary 118 (H) 65 - 99 mg/dL   Comment 1 Notify RN   Glucose, capillary     Status: None   Collection Time: 01/16/16  4:33 PM  Result Value Ref Range   Glucose-Capillary 85 65 - 99 mg/dL   Comment 1 Notify RN     Blood Alcohol level:  Lab Results  Component Value Date   ETH <5 [id number]    Metabolic Disorder Labs: Lab Results  Component Value Date   HGBA1C 7.4 (H) 01/02/2016   MPG 192 12/04/2015   MPG 318 (H) 08/24/2010   Lab Results  Component Value Date   PROLACTIN 4.4 12/11/2015   Lab Results  Component Value Date   CHOL 143 12/11/2015   TRIG 286 (H) 12/11/2015   HDL 32 (L) 12/11/2015   CHOLHDL 4.5 12/11/2015   VLDL 57 (H) 12/11/2015   LDLCALC 54 12/11/2015   LDLCALC 56 12/05/2015    Physical Findings: AIMS: Facial and Oral Movements Muscles of Facial Expression: None, normal Lips and Perioral Area: None, normal Jaw: None, normal Tongue: None, normal,Extremity Movements Upper (arms, wrists, hands, fingers): None, normal Lower (legs, knees, ankles, toes): None, normal, Trunk Movements Neck, shoulders, hips: None, normal, Overall Severity Severity of abnormal movements (highest score from questions above): None, normal Incapacitation due to abnormal movements: None, normal Patient's awareness of abnormal movements (rate only patient's report): No Awareness, Dental Status Current problems with teeth and/or dentures?: No Does patient usually wear dentures?: No  CIWA:    COWS:      Musculoskeletal: Strength & Muscle Tone: within normal limits Gait & Station: normal Patient leans: N/A  Psychiatric Specialty Exam: Physical Exam  Nursing note and vitals reviewed. Constitutional: He appears well-developed and well-nourished.  HENT:  Head: Normocephalic and atraumatic.  Eyes: Conjunctivae are normal. Pupils are equal, round, and reactive to light.  Neck: Normal range of motion.  Cardiovascular: Normal heart sounds.   Respiratory: Effort normal. No respiratory distress.  GI: Soft.  Musculoskeletal: Normal range of motion.  Neurological: He is alert.  Skin: Skin is warm and dry.     Psychiatric: His affect is labile and inappropriate. His speech is tangential. He is hyperactive. Thought content is delusional. He expresses impulsivity. He expresses no homicidal and no suicidal ideation. He exhibits abnormal recent memory. He is inattentive.    Review of Systems  Constitutional: Negative.  Negative for chills, fever, malaise/fatigue and weight loss.  HENT: Negative.  Negative for hearing loss, nosebleeds, sore throat and tinnitus.   Eyes: Negative.  Negative for blurred vision and double vision.  Respiratory: Negative.  Negative for cough, hemoptysis, sputum production, shortness of breath and wheezing.   Cardiovascular: Negative.  Negative for chest pain, palpitations and claudication.  Gastrointestinal: Negative.  Negative for abdominal pain, blood in stool, constipation, diarrhea, heartburn, nausea and vomiting.  Genitourinary: Negative for dysuria, frequency and urgency.  Musculoskeletal: Negative.  Negative for back pain, falls, joint pain, myalgias and neck pain.  Skin: Negative.  Negative for itching and rash.  Neurological: Negative.  Negative for dizziness, tingling, tremors, focal weakness, seizures and headaches.  Endo/Heme/Allergies: Negative for environmental allergies. Does not bruise/bleed easily.  Psychiatric/Behavioral: Negative for depression,  hallucinations, memory loss, substance abuse and suicidal ideas. The patient is not nervous/anxious and does not have insomnia.     Blood pressure 121/77, pulse 79, temperature 98.3 F (36.8 C), resp. rate 18, height 6' (1.829 m), weight 129.7 kg (286 lb), SpO2 100 %.Body mass index is 38.79 kg/m.  General Appearance: Disheveled  Eye Contact:  Fair  Speech:  Pressured  Volume:  Increased  Mood:  Euphoric  Affect:  Labile  Thought Process:  Disorganized  Orientation:  Full (Time, Place, and Person)  Thought Content:  Illogical, Delusions, Rumination and Tangential  Suicidal Thoughts:  No  Homicidal Thoughts:  No  Memory:  Immediate;   Fair Recent;   Poor Remote;   Fair  Judgement:  Impaired  Insight:  Shallow  Psychomotor Activity:  Increased  Concentration:  Concentration: Poor  Recall:  [place] of Knowledge:  Good  Language:  Good  Akathisia:  No  Handed:  Right  AIMS (if indicated):     Assets:  Financial Resources/Insurance Housing Social Support  ADL's:  Impaired  Cognition:  Impaired,  Mild  Sleep:  Number of Hours: 7     Treatment Plan Summary:  Patient has had 9 ECT treatments now has tolerated them fine. No sign of any delirium or organic kind of presentation. Gradually getting better. No change to medicine today but I strongly recommend that we continue ECT and orders will be done for ECT tomorrow morning.  Bipolar disorder: Most recent episode manic: Patient has had ECT treatments, 8 of them at this point. He avoided having treatment today by eating. I counseled him this afternoon that I think he is clearly been showing improvement and is not having any side effects and should continue with ECT. I would like putting him on the schedule for Monday. As usual he is against this although his rationality is poor. I talked with Dr. Reuel Derby office today and reviewed how this is a typical presentation for the patient.  I am going to increase his Seroquel tonight to  1400 mg. He is tolerating the high dose without any side effects.   Hyperlipidemia: We'll plan to continue Lipitor 40 mg by mouth daily and fenofibrate 160 mg by mouth daily  Cellulitis of the leg: Will continue Augmentin 1 tablet by mouth twice a day. Cellulitis is slowly improving.  History of pulmonary embolism: We'll continue Coumadin 8 mg by mouth nightly.  Disposition: The patient will be discharged home with his wife because he does have a stable living situation. Psychotropic medication management follow-up appointment as well as individual therapy will be scheduled with Dr. Thurmond Butts. I had a nice talk with Dr. Thurmond Butts today. We reviewed the patient's history of nearly intractable mania. He recommends and I agree with the plan to try to continue ECT as long as tolerated. No change to medicine today except I will continue to decrease his insulin since he is running low blood sugars. Alethia Berthold, MD 01/16/2016, 7:57 PM

## 2016-01-16 NOTE — BHH Group Notes (Signed)
[facility] Group Notes:  (Nursing/MHT/Case Management/Adjunct)  Date:  01/16/2016  Time:  2:22 PM  Type of Therapy:  Psychoeducational Skills  Participation Level:  Active  Participation Quality:  Appropriate, Attentive and Sharing  Affect:  Appropriate  Cognitive:  Alert and Appropriate  Insight:  Appropriate  Engagement in Group:  Engaged  Modes of Intervention:  Discussion, Education and Support  Summary of Progress/Problems:  Cameron [name] 01/16/2016, 2:22 PM

## 2016-01-17 ENCOUNTER — Inpatient Hospital Stay: Payer: 59 | Admitting: Certified Registered Nurse Anesthetist

## 2016-01-17 ENCOUNTER — Encounter: Payer: Self-pay | Admitting: *Deleted

## 2016-01-17 LAB — GLUCOSE, CAPILLARY
GLUCOSE-CAPILLARY: 100 mg/dL — AB (ref 65–99)
GLUCOSE-CAPILLARY: 111 mg/dL — AB (ref 65–99)
Glucose-Capillary: 87 mg/dL (ref 65–99)

## 2016-01-17 LAB — PROTIME-INR
INR: 1.77
PROTHROMBIN TIME: 20.8 s — AB (ref 11.4–15.2)

## 2016-01-17 MED ORDER — DEXTROSE 5 % IV SOLN: 250.0000 mL | Freq: Once | INTRAVENOUS | Status: DC

## 2016-01-17 MED ORDER — MIDAZOLAM HCL 2 MG/2ML IJ SOLN: 2.0000 mg | Freq: Once | INTRAMUSCULAR | Status: DC

## 2016-01-17 MED ORDER — KETOROLAC TROMETHAMINE 30 MG/ML IJ SOLN
INTRAMUSCULAR | Status: AC
  Administered 2016-01-17: 30 mg via INTRAVENOUS
  Filled 2016-01-17: qty 1

## 2016-01-17 MED ORDER — SODIUM CHLORIDE 0.9 % IV SOLN
Freq: Once | INTRAVENOUS | Status: AC
  Administered 2016-01-17: 10:00:00 via INTRAVENOUS

## 2016-01-17 MED ORDER — WARFARIN SODIUM 2.5 MG PO TABS
7.5000 mg | ORAL_TABLET | Freq: Every day | ORAL | Status: DC
  Administered 2016-01-17 – 2016-01-21 (×5): 7.5 mg via ORAL
  Filled 2016-01-17 (×5): qty 3

## 2016-01-17 MED ORDER — LABETALOL HCL 5 MG/ML IV SOLN
INTRAVENOUS | Status: DC | PRN
  Administered 2016-01-17: 20 mg via INTRAVENOUS

## 2016-01-17 MED ORDER — KETAMINE HCL 10 MG/ML IJ SOLN
INTRAMUSCULAR | Status: DC | PRN
  Administered 2016-01-17: 100 mg via INTRAVENOUS

## 2016-01-17 MED ORDER — MIDAZOLAM HCL 2 MG/2ML IJ SOLN
INTRAMUSCULAR | Status: DC | PRN
  Administered 2016-01-17: 2 mg via INTRAVENOUS

## 2016-01-17 MED ORDER — SUCCINYLCHOLINE CHLORIDE 20 MG/ML IJ SOLN
INTRAMUSCULAR | Status: DC | PRN
  Administered 2016-01-17: 150 mg via INTRAVENOUS

## 2016-01-17 MED ORDER — KETOROLAC TROMETHAMINE 30 MG/ML IJ SOLN
30.0000 mg | Freq: Once | INTRAMUSCULAR | Status: AC
  Administered 2016-01-17: 30 mg via INTRAVENOUS

## 2016-01-17 MED ORDER — SODIUM CHLORIDE 0.9 % IV SOLN
INTRAVENOUS | Status: DC | PRN
  Administered 2016-01-17: 11:00:00 via INTRAVENOUS

## 2016-01-17 NOTE — Progress Notes (Signed)
Patient tolerated ECT.Patient is hyperverbal and continues to have flight of ideas.Compliant with medications.His affect is sad.Appetite & energy level good.

## 2016-01-17 NOTE — Progress Notes (Signed)
Recreation Therapy Notes  Date: 08.16.17 Time: 1:00 pm Location: Craft Room  Group Topic: Self-esteem  Goal Area(s) Addresses:  Patient will write at least one positive trait. Patient will verbalize benefit of having a healthy self-esteem.  Behavioral Response: Did not attend   Intervention: I Am  Activity: Patients were given a worksheet with the letter I on it and instructed to write as many positive traits inside the letter.  Education: LRT educated patients on ways they can increase their self-esteem.  Education Outcome: Patient did not attend group.  Clinical Observations/Feedback: Patient did not attend group.  [name], LRT/CTRS 01/17/2016 2:13 PM

## 2016-01-17 NOTE — Tx Team (Signed)
Interdisciplinary Treatment Plan Update (Adult)  Date:  01/17/2016 Time Reviewed:  4:09 PM Progress in Treatment: Attending groups: Yes. Participating in groups:  Yes. Taking medication as prescribed:  Yes. Tolerating medication:  Yes. Family/Significant othe contact made:  Yes, individual(s) contacted:  Patients wife Patient understands diagnosis:  Yes. Discussing patient identified problems/goals with staff:  Yes. Medical problems stabilized or resolved:  Yes. Denies suicidal/homicidal ideation: Yes. Issues/concerns per patient self-inventory:  No. Other:  New problem(s) identified: n/a  Discharge Plan or Barriers: Home with wife, Medication management follow-up with Dr. Thurmond Butts.   Reason for Continuation of Hospitalization: Depression Mania Medication stabilization Other; describe ECT Treatment  Comments:Pt more redirectable, remains labile, hyperverbal, disorganized, grandiose. One to one sitter was discontinued as behaviors are more predictable now.  Estimated length of stay: 7days  New goal(s): n/a  Review of initial/current patient goals per problem list:   1. Goal(s): Patient will participate in aftercare plan  Met: Yes Target date:At discharge As evidenced by: Patient will participate within aftercare plan AEB aftercare provider and housing plan at discharge being identified.  8/4: Pt will later discharge home to [facility] to live with his family and will follow up with [facility] for medication management and with [place] for therapy   2. . Goal(s): Patient will demonstrate decreased signs of psychosis  * Met: No * Target date: at discharge * As evidenced by: Patient will demonstrate decreased frequency of AVH or return to baseline function    3. Goal (s): Patient will demonstrate decreased signs of mania  * Met: No * Target date: at discharge * As evidenced by: Patient demonstrate  decreased signs of mania AEB decreased mood instability and demonstration of stable mood    Attendees: Patient:  8/16/20174:09 PM  Family:   8/16/20174:09 PM  Physician:  Dr. Weber Cooks 8/16/20174:09 PM  Nursing:   Polly Cobia, RN 8/16/20174:09 PM  Case Manager:   8/16/20174:09 PM  Counselor:  Dossie Arbour, LCSW 8/16/20174:09 PM  Other:  Everitt Amber, [place] 8/16/20174:09 PM  Other:   8/16/20174:09 PM  Other:   8/16/20174:09 PM  Other:  8/16/20174:09 PM  Other:  8/16/20174:09 PM  Other:  8/16/20174:09 PM  Other:  8/16/20174:09 PM  Other:  8/16/20174:09 PM  Other:  8/16/20174:09 PM  Other:   8/16/20174:09 PM   Scribe for Treatment Team:   Carloyn Jaeger Milanna Kozlov,MSW, LCSW 01/17/2016, 4:09 PM

## 2016-01-17 NOTE — Transfer of Care (Signed)
Immediate Anesthesia Transfer of Care Note  Patient: Cameron [name]  Procedure(s) Performed: * No procedures listed *  Patient Location: PACU  Anesthesia Type:General  Level of Consciousness: sedated  Airway & Oxygen Therapy: Patient Spontanous Breathing and Patient connected to face mask oxygen  Post-op Assessment: Report given to RN and Post -op Vital signs reviewed and stable  Post vital signs: Reviewed and stable  Last Vitals:  Vitals:   01/17/16 0858 01/17/16 1123  BP: 128/70 135/86  Pulse: 67 78  Resp: 18 (!) 26  Temp: 36.4 C 36.8 C    Last Pain:  Vitals:   01/17/16 1123  TempSrc: Tympanic  PainSc:       Patients Stated Pain Goal: 0 ([id number])  Complications: No apparent anesthesia complications

## 2016-01-17 NOTE — Progress Notes (Signed)
ANTICOAGULATION CONSULT NOTE - Follow Up Consult  Pharmacy Consult for Warfarin Indication: VTE treatment  Allergies  Allergen Reactions  . Asa [Aspirin] Other (See Comments)    Patient tolerates LOW DOSE ASPIRIN.    Patient Measurements: Height: 6' (182.9 cm) Weight: 284 lb (128.8 kg) IBW/kg (Calculated) : 77.6  Vital Signs: Temp: 97.6 F (36.4 C) (08/16 0858) Temp Source: Oral (08/16 0700) BP: 128/70 (08/16 0858) Pulse Rate: 67 (08/16 0858)  Labs:  Recent Labs  01/15/16 0633 01/16/16 0648 01/17/16 0731  HGB 11.7*  --   --   HCT 35.1*  --   --   PLT 215  --   --   LABPROT 24.6* 26.8* 20.8*  INR 2.18 2.42 1.77   Estimated Creatinine Clearance: 75.8 mL/min (by C-G formula based on SCr of 1.6 mg/dL).  Medical History: Past Medical History:  Diagnosis Date  . Bipolar 1 disorder ([place])   . CKD (chronic kidney disease), stage III   . Diabetes mellitus without complication ([place])   . DVT (deep venous thrombosis) ([place])   . Hypercholesteremia   . Hypertension    Medications:  Warfarin   Assessment: 62 yom admitted to ED BHU with manic symptoms. Recently discharged from [facility] with DVT/PE, had been bridging LMWH and VKA prior to admission.  INR History: 7/9: INR - 1.99 7/10: no INR- warfarin 12.5 mg po daily at 0200 7/11: INR - 2.97- warfarin held 7/12: INR - 1.92 - warfarin 10 mg 7/13: INR - 1.95 - warfarin 10 mg 7/14: INR - 2.56 - warfarin 5 mg 7/15: INR - 2.73 - warfarin 5 mg 7/16: INR - 2.39 - warfarin 7.5 mg 7/17: INR - 2.22 - warfarin 7.5 mg 7/18: INR - 2.53 - warfarin 7.5 mg 7/19: INR - 2.75 - warfarin 7.5 mg 7/20: INR - 2.99 - warfarin 6.5 mg 7/21: INR - 2.64 - warfarin 6.5 mg  7/22: INR: 2.44; warfarin 8  7/23: INR: 2.60;  Warfarin 10mg  7/24: INR 2.44 - warfarin 10mg  7/25:  INR 3.22 - warfarin 10mg  7/26:  INR 3.08 - warfarin 8mg  7/27:  INR 3.24 - warfarin 8 mg 7/28:  INR 2.79 - 8 mg 7/29:  INR 2.74- 8 mg 7/30:  INR 3.19 - 7 mg 7/31:  INR 2.73   7.5mg  8/1:    INR 3.13  7.5mg  8/2:    INR 2.74  7.5mg  8/3     INR 2.27  7.5mg  8/4     INR 3.19  8 mg 8/5     INR 3.04  7.5 mg 8/6     INR 3.05  7.5mg  8/7:    INR  3.22  6mg  8/8:    INR  3.85  Dose held per protocol 8/9:    INR 2.4     6mg  8/10:  INR 1.93   7mg   8/11:  INR  2.00  7mg  8/12:  INR  2.26  6 mg 8/13:  INR 2.22   7 mg 8/14:  INR 2.18   7mg  8/15:  INR 2.42   NONE given 8/16:  INR 1.77  Goal of Therapy:  INR 2-3 Monitor platelets by anticoagulation protocol: Yes   Plan:  INR is subtherapeutic today. Patient was not given any evening meds yesterday.  Will give warfarin 7.5 mg today and f/u AM INR. Also order CBC for tomorrow  (Patient with orders for carbamazepine which interacts with warfarin to decrease the INR. Patient has orders for fenofibrate which interacts with warfarin to increase  the INR. Will follow INR closely.)   Pt will need CBC q3 days per policy. Pharmacy will continue to follow.   Machael Raine K, RPH 01/17/2016,9:34 AM

## 2016-01-17 NOTE — Procedures (Signed)
ECT SERVICES Physician's Interval Evaluation & Treatment Note  Patient Identification: Cameron [name] MRN:  [id number] Date of Evaluation:  01/17/2016 TX #: [id number]  MADRS: 20  MMSE: 27  P.E. Findings:  No change to physical exam. Heart and lungs normal. Vitals normal.  Psychiatric Interval Note:  Still manic still euphoric still having flight of ideas  Subjective:  Patient is a 50 y.o. male seen for evaluation for Electroconvulsive Therapy. No specific new complaint.  Treatment Summary:   []   Right Unilateral             [x]  Bilateral   % Energy : 1.0 ms 100%   Impedance: 990 ohms  Seizure Energy Index: 2598 V squared  Postictal Suppression Index: 86%  Seizure Concordance Index: 95%  Medications  Pre Shock: Toradol 30 mg, ketamine 100 mg, succinylcholine 150 mg  Post Shock: Versed 2 mg  Seizure Duration: 25 seconds by EMG observation, 52 seconds by EEG   Comments: Much better quality seizure by all parameters. At this point appears to be tolerating ketamine. Follow-up treatment on Friday.   Lungs:  [x]   Clear to auscultation               []  Other:   Heart:    [x]   Regular rhythm             []  irregular rhythm    [x]   Previous H&P reviewed, patient examined and there are NO CHANGES                 []   Previous H&P reviewed, patient examined and there are changes noted.   Alethia Berthold, MD 8/16/201711:00 AM

## 2016-01-17 NOTE — B[facility] Group Notes (Signed)
[facility] LCSW Group Therapy  01/17/2016 2:58 PM  Did not attend because he was in ECT today.  August Saucer, MSW, LCSW 01/17/2016, 2:58 PM

## 2016-01-17 NOTE — Anesthesia Postprocedure Evaluation (Signed)
Anesthesia Post Note  Patient: Cameron [name]  Procedure(s) Performed: * No procedures listed *  Anesthesia Post Evaluation  Last Vitals:  Vitals:   01/17/16 1153 01/17/16 1203  BP: (!) 156/86 136/79  Pulse: 78 70  Resp: 19 14  Temp:  37 C    Last Pain:  Vitals:   01/17/16 1203  TempSrc:   PainSc: 0-No pain                 [name]

## 2016-01-17 NOTE — Plan of Care (Signed)
Problem: Role Relationship: Goal: Ability to interact with others will improve Outcome: Progressing Pt spends most of the evening in the milieu interacting appropriately with staff and peers.  Problem: Safety: Goal: Ability to remain free from injury will improve Outcome: Progressing Pt remains free from harm.

## 2016-01-17 NOTE — Progress Notes (Signed)
Carmelo is not intrusive & less talkative now.Visible in the milieu.Appropriate with peers.

## 2016-01-17 NOTE — Anesthesia Preprocedure Evaluation (Signed)
Anesthesia Evaluation  Patient identified by MRN, date of birth, ID band Patient awake    Reviewed: Allergy & Precautions, H&P , NPO status , Patient's Chart, lab work & pertinent test results, reviewed documented beta blocker date and time   Airway Mallampati: II   Neck ROM: full    Dental  (+) Poor Dentition   Pulmonary neg pulmonary ROS,    Pulmonary exam normal        Cardiovascular hypertension, + Peripheral Vascular Disease  negative cardio ROS Normal cardiovascular exam Rhythm:regular Rate:Normal     Neuro/Psych PSYCHIATRIC DISORDERS negative neurological ROS  negative psych ROS   GI/Hepatic negative GI ROS, Neg liver ROS,   Endo/Other  negative endocrine ROSdiabetes  Renal/GU Renal diseasenegative Renal ROS  negative genitourinary   Musculoskeletal   Abdominal   Peds  Hematology negative hematology ROS (+)   Anesthesia Other Findings Past Medical History: No date: Bipolar 1 disorder (HCC) No date: CKD (chronic kidney disease), stage III No date: Diabetes mellitus without complication (HCC) No date: DVT (deep venous thrombosis) (HCC) No date: Hypercholesteremia No date: Hypertension Past Surgical History: No date: APPENDECTOMY BMI    Body Mass Index:  38.52 kg/m     Reproductive/Obstetrics                             Anesthesia Physical Anesthesia Plan  ASA: III  Anesthesia Plan: General   Post-op Pain Management:    Induction:   Airway Management Planned:   Additional Equipment:   Intra-op Plan:   Post-operative Plan:   Informed Consent: I have reviewed the patients History and Physical, chart, labs and discussed the procedure including the risks, benefits and alternatives for the proposed anesthesia with the patient or authorized representative who has indicated his/her understanding and acceptance.   Dental Advisory Given  Plan Discussed with:  CRNA  Anesthesia Plan Comments:         Anesthesia Quick Evaluation

## 2016-01-17 NOTE — Plan of Care (Signed)
Problem: Coping: Goal: Ability to verbalize feelings will improve Outcome: Progressing Patient is talking less & not intrusive.

## 2016-01-17 NOTE — Progress Notes (Signed)
Pt in halls grunting d/t neck pain. PRN medication administered upon request. Pt becomes agitated due to NPO status, stating "I'll just drink toilet water then!" Pt redirected to room by Probation officer and security. Pt in bathroom placing foot into the toilet water. PRN ativan administered. Pt sitting in armchair and encouraged to read. Pt remains free from harm. Will continue to monitor.

## 2016-01-17 NOTE — Progress Notes (Signed)
[facility] MD Progress Note  01/17/2016 8:02 PM Cameron [name]  MRN:  [id number] Subjective:   Follow-up for Wednesday the 16th. Patient with bipolar disorder continues to be hyperverbal and have flight of ideas. His overall energy level though seems to be coming back closer to normal. Also his affect is becoming more appropriate. He responds better to verbal and nonverbal cues. He is not showing frank psychosis so much as some disorganized thought. He has tolerated treatment very well. Principal Problem: Bipolar I disorder, current or most recent episode manic, with psychotic features ([place]) Diagnosis:   Patient Active Problem List   Diagnosis Date Noted  . Bipolar affective disorder, current episode manic ([place]) [F31.9] 01/04/2016  . Cellulitis of leg, right [L03.115] 01/01/2016  . Bipolar I disorder, current or most recent episode manic, with psychotic features ([place]) [F31.2] 12/11/2015  . Pulmonary embolism ([place]) [I26.99] 12/04/2015  . Diabetes mellitus without complication ([place]) [id number]   . Hypercholesteremia [E78.00]   . CKD (chronic kidney disease), stage III [N18.3]   . Acute deep vein thrombosis (DVT) of femoral vein of right lower extremity (HCC) [I82.411]    Total Time spent with patient: 20 minutes  Past Psychiatric History: Long history of bipolar disorder multiple manic episodes. History of response to ECT.  Past Medical History:  Past Medical History:  Diagnosis Date  . Bipolar 1 disorder ([place])   . CKD (chronic kidney disease), stage III   . Diabetes mellitus without complication ([place])   . DVT (deep venous thrombosis) ([place])   . Hypercholesteremia   . Hypertension     Past Surgical History:  Procedure Laterality Date  . APPENDECTOMY     Family History:  Family History  Problem Relation Age of Onset  . Stroke Other   . Diabetes Other    Family Psychiatric  History: Positive for bipolar disorder Social History:  History  Alcohol Use  . Yes    Comment: rarely       History  Drug Use No    Social History   Social History  . Marital status: Married    Spouse name: N/A  . Number of children: N/A  . Years of education: N/A   Social History Main Topics  . Smoking status: Never Smoker  . Smokeless tobacco: Never Used  . Alcohol use Yes     Comment: rarely  . Drug use: No  . Sexual activity: Not Asked   Other Topics Concern  . None   Social History Narrative  . None   Additional Social History:                         Sleep: Fair  Appetite:  Fair  Current Medications: Current Facility-Administered Medications  Medication Dose Route Frequency Provider Last Rate Last Dose  . acetaminophen (TYLENOL) tablet 650 mg  650 mg Oral Q6H PRN Gonzella Lex, MD   650 mg at 01/17/16 0536  . alum & mag hydroxide-simeth (MAALOX/MYLANTA) 200-200-20 MG/5ML suspension 30 mL  30 mL Oral Q4H PRN Gonzella Lex, MD      . atorvastatin (LIPITOR) tablet 40 mg  40 mg Oral q1800 Gonzella Lex, MD   40 mg at 01/17/16 1808  . carbamazepine (TEGRETOL) tablet 1,200 mg  1,200 mg Oral BID PC Gonzella Lex, MD   1,200 mg at 01/17/16 1807  . dextrose 5 % solution 250 mL  250 mL Intravenous Once Gonzella Lex, MD      .  enalapril (VASOTEC) tablet 5 mg  5 mg Oral BID Gonzella Lex, MD   5 mg at 01/17/16 0817  . fenofibrate tablet 160 mg  160 mg Oral Daily Gonzella Lex, MD   160 mg at 01/17/16 1222  . glucagon (human recombinant) (GLUCAGEN) injection 1 mg  1 mg Intramuscular Once PRN Gonzella Lex, MD      . haloperidol lactate (HALDOL) injection 20 mg  20 mg Intramuscular Q6H PRN Gonzella Lex, MD      . insulin glargine (LANTUS) injection 30 Units  30 Units Subcutaneous QHS Gonzella Lex, MD   30 Units at 01/16/16 2209  . lamoTRIgine (LAMICTAL) tablet 200 mg  200 mg Oral Daily Gonzella Lex, MD   200 mg at 01/17/16 1222  . lithium carbonate capsule 300 mg  300 mg Oral QHS Gonzella Lex, MD   300 mg at 01/16/16 2209  . LORazepam (ATIVAN) injection 2  mg  2 mg Intramuscular Q6H PRN Gonzella Lex, MD      . LORazepam (ATIVAN) tablet 1 mg  1 mg Oral Q4H PRN Rainey [place], MD   1 mg at 01/17/16 0545  . magnesium hydroxide (MILK OF MAGNESIA) suspension 30 mL  30 mL Oral Daily PRN Gonzella Lex, MD      . midazolam (VERSED) injection 2 mg  2 mg Intravenous Once Gonzella Lex, MD      . QUEtiapine (SEROQUEL) tablet 1,400 mg  1,400 mg Oral QHS Gonzella Lex, MD   1,400 mg at 01/16/16 2209  . temazepam (RESTORIL) capsule 15 mg  15 mg Oral QHS Gonzella Lex, MD   15 mg at 01/16/16 2209  . warfarin (COUMADIN) tablet 7.5 mg  7.5 mg Oral q1800 Gonzella Lex, MD   7.5 mg at 01/17/16 1808  . Warfarin - Pharmacist Dosing Inpatient   Does not apply q1800 Gonzella Lex, MD   7.5 each at 01/17/16 1800    Lab Results:  Results for orders placed or performed during the hospital encounter of 01/04/16 (from the past 48 hour(s))  Glucose, capillary     Status: Abnormal   Collection Time: 01/15/16  9:03 PM  Result Value Ref Range   Glucose-Capillary 135 (H) 65 - 99 mg/dL  Glucose, capillary     Status: Abnormal   Collection Time: 01/16/16  6:27 AM  Result Value Ref Range   Glucose-Capillary 100 (H) 65 - 99 mg/dL  Protime-INR     Status: Abnormal   Collection Time: 01/16/16  6:48 AM  Result Value Ref Range   Prothrombin Time 26.8 (H) 11.4 - 15.2 seconds   INR 2.42   Glucose, capillary     Status: Abnormal   Collection Time: 01/16/16 11:45 AM  Result Value Ref Range   Glucose-Capillary 118 (H) 65 - 99 mg/dL   Comment 1 Notify RN   Glucose, capillary     Status: None   Collection Time: 01/16/16  4:33 PM  Result Value Ref Range   Glucose-Capillary 85 65 - 99 mg/dL   Comment 1 Notify RN   Glucose, capillary     Status: Abnormal   Collection Time: 01/16/16  8:35 PM  Result Value Ref Range   Glucose-Capillary 103 (H) 65 - 99 mg/dL  Glucose, capillary     Status: None   Collection Time: 01/17/16  6:48 AM  Result Value Ref Range   Glucose-Capillary  87 65 - 99 mg/dL  Protime-INR  Status: Abnormal   Collection Time: 01/17/16  7:31 AM  Result Value Ref Range   Prothrombin Time 20.8 (H) 11.4 - 15.2 seconds   INR 1.77   Glucose, capillary     Status: Abnormal   Collection Time: 01/17/16  4:13 PM  Result Value Ref Range   Glucose-Capillary 100 (H) 65 - 99 mg/dL   Comment 1 Notify RN     Blood Alcohol level:  Lab Results  Component Value Date   ETH <5 [id number]    Metabolic Disorder Labs: Lab Results  Component Value Date   HGBA1C 7.4 (H) 01/02/2016   MPG 192 12/04/2015   MPG 318 (H) 08/24/2010   Lab Results  Component Value Date   PROLACTIN 4.4 12/11/2015   Lab Results  Component Value Date   CHOL 143 12/11/2015   TRIG 286 (H) 12/11/2015   HDL 32 (L) 12/11/2015   CHOLHDL 4.5 12/11/2015   VLDL 57 (H) 12/11/2015   LDLCALC 54 12/11/2015   LDLCALC 56 12/05/2015    Physical Findings: AIMS: Facial and Oral Movements Muscles of Facial Expression: None, normal Lips and Perioral Area: None, normal Jaw: None, normal Tongue: None, normal,Extremity Movements Upper (arms, wrists, hands, fingers): None, normal Lower (legs, knees, ankles, toes): None, normal, Trunk Movements Neck, shoulders, hips: None, normal, Overall Severity Severity of abnormal movements (highest score from questions above): None, normal Incapacitation due to abnormal movements: None, normal Patient's awareness of abnormal movements (rate only patient's report): No Awareness, Dental Status Current problems with teeth and/or dentures?: No Does patient usually wear dentures?: No  CIWA:    COWS:     Musculoskeletal: Strength & Muscle Tone: within normal limits Gait & Station: normal Patient leans: N/A  Psychiatric Specialty Exam: Physical Exam  Nursing note and vitals reviewed. Constitutional: He appears well-developed and well-nourished.  HENT:  Head: Normocephalic and atraumatic.  Eyes: Conjunctivae are normal. Pupils are equal, round, and  reactive to light.  Neck: Normal range of motion.  Cardiovascular: Normal heart sounds.   Respiratory: Effort normal. No respiratory distress.  GI: Soft.  Musculoskeletal: Normal range of motion.  Neurological: He is alert.  Skin: Skin is warm and dry.     Psychiatric: His affect is labile and inappropriate. His speech is tangential. He is hyperactive. Thought content is delusional. He expresses impulsivity. He expresses no homicidal and no suicidal ideation. He exhibits abnormal recent memory. He is inattentive.    Review of Systems  Constitutional: Negative.  Negative for chills, fever, malaise/fatigue and weight loss.  HENT: Negative.  Negative for hearing loss, nosebleeds, sore throat and tinnitus.   Eyes: Negative.  Negative for blurred vision and double vision.  Respiratory: Negative.  Negative for cough, hemoptysis, sputum production, shortness of breath and wheezing.   Cardiovascular: Negative.  Negative for chest pain, palpitations and claudication.  Gastrointestinal: Negative.  Negative for abdominal pain, blood in stool, constipation, diarrhea, heartburn, nausea and vomiting.  Genitourinary: Negative for dysuria, frequency and urgency.  Musculoskeletal: Negative.  Negative for back pain, falls, joint pain, myalgias and neck pain.  Skin: Negative.  Negative for itching and rash.  Neurological: Negative.  Negative for dizziness, tingling, tremors, focal weakness, seizures and headaches.  Endo/Heme/Allergies: Negative for environmental allergies. Does not bruise/bleed easily.  Psychiatric/Behavioral: Negative for depression, hallucinations, memory loss, substance abuse and suicidal ideas. The patient is not nervous/anxious and does not have insomnia.     Blood pressure 136/79, pulse 70, temperature 98.6 F (37 C), resp. rate 14, height 6' (  1.829 m), weight 128.8 kg (284 lb), SpO2 100 %.Body mass index is 38.52 kg/m.  General Appearance: Disheveled  Eye Contact:  Fair  Speech:   Pressured  Volume:  Increased  Mood:  Euphoric  Affect:  Labile  Thought Process:  Disorganized  Orientation:  Full (Time, Place, and Person)  Thought Content:  Illogical, Delusions, Rumination and Tangential  Suicidal Thoughts:  No  Homicidal Thoughts:  No  Memory:  Immediate;   Fair Recent;   Poor Remote;   Fair  Judgement:  Impaired  Insight:  Shallow  Psychomotor Activity:  Increased  Concentration:  Concentration: Poor  Recall:  [place] of Knowledge:  Good  Language:  Good  Akathisia:  No  Handed:  Right  AIMS (if indicated):     Assets:  Financial Resources/Insurance Housing Social Support  ADL's:  Impaired  Cognition:  Impaired,  Mild  Sleep:  Number of Hours: 5.5     Treatment Plan Summary:  Patient has had 9 ECT treatments now has tolerated them fine. No sign of any delirium or organic kind of presentation. Gradually getting better. No change to medicine today but I strongly recommend that we continue ECT and orders will be done for ECT tomorrow morning.  Bipolar disorder: Most recent episode manic: Patient has had ECT treatments, 8 of them at this point. He avoided having treatment today by eating. I counseled him this afternoon that I think he is clearly been showing improvement and is not having any side effects and should continue with ECT. I would like putting him on the schedule for Monday. As usual he is against this although his rationality is poor. I talked with Dr. Reuel Derby office today and reviewed how this is a typical presentation for the patient.  I am going to increase his Seroquel tonight to 1400 mg. He is tolerating the high dose without any side effects.   Hyperlipidemia: We'll plan to continue Lipitor 40 mg by mouth daily and fenofibrate 160 mg by mouth daily  Cellulitis of the leg: Will continue Augmentin 1 tablet by mouth twice a day. Cellulitis is slowly improving.  History of pulmonary embolism: We'll continue Coumadin 8 mg by mouth  nightly.  Disposition: The patient will be discharged home with his wife because he does have a stable living situation. Psychotropic medication management follow-up appointment as well as individual therapy will beScheduled with Dr. Thurmond Butts.  Patient had ECT today. We used ketamine for anesthetic and he had a much better quality seizure than previously. Patient continues to be hyperverbal easily distracted and have flight of ideas but he is also looking overall much better. He is taking better care of his hygiene, he is able to engage more appropriately in conversation and attending groups without being disruptive. I'm still uncertain how safe it would be to discharge him home. We are going to plan on ECT for Friday and I will try to keep up with his wife and family as well as the patient about when he will be ready for discharge.    scheduled with Dr. Thurmond Butts.Alethia Berthold, MD 01/17/2016, 8:02 PM

## 2016-01-17 NOTE — H&P (Signed)
Cameron [name] is an 50 y.o. male.   Chief Complaint: No specific complaint. Still manic. No pain. HPI: Bipolar disorder with mania. Still having flight of ideas and euphoria and agitation. Improving.  Past Medical History:  Diagnosis Date  . Bipolar 1 disorder ([place])   . CKD (chronic kidney disease), stage III   . Diabetes mellitus without complication ([place])   . DVT (deep venous thrombosis) ([place])   . Hypercholesteremia   . Hypertension     Past Surgical History:  Procedure Laterality Date  . APPENDECTOMY      Family History  Problem Relation Age of Onset  . Stroke Other   . Diabetes Other    Social History:  reports that he has never smoked. He has never used smokeless tobacco. He reports that he drinks alcohol. He reports that he does not use drugs.  Allergies:  Allergies  Allergen Reactions  . Asa [Aspirin] Other (See Comments)    Patient tolerates LOW DOSE ASPIRIN.    Medications Prior to Admission  Medication Sig Dispense Refill  . amoxicillin-clavulanate (AUGMENTIN) 875-125 MG tablet Take 1 tablet by mouth 2 (two) times daily. X 7 more days 14 tablet 0  . ARIPiprazole (ABILIFY) 30 MG tablet Take 30 mg by mouth every evening.     Marland Kitchen atorvastatin (LIPITOR) 40 MG tablet Take 40 mg by mouth daily.  11  . carbamazepine (TEGRETOL) 200 MG tablet Take 2 tablets (400 mg total) by mouth every morning. 30 tablet 0  . carbamazepine (TEGRETOL) 200 MG tablet Take 3 tablets (600 mg total) by mouth every evening. 30 tablet 0  . enalapril (VASOTEC) 5 MG tablet Take 5 mg by mouth 2 (two) times daily.    . fenofibrate 160 MG tablet Take 160 mg by mouth daily.    . insulin aspart (NOVOLOG) 100 UNIT/ML injection Inject 0-9 Units into the skin 3 (three) times daily with meals. 10 mL 11  . insulin aspart (NOVOLOG) 100 UNIT/ML injection Inject 0-5 Units into the skin at bedtime. 10 mL 11  . Insulin Glargine (LANTUS SOLOSTAR) 100 UNIT/ML Solostar Pen Inject 60 Units into the skin daily.      Marland Kitchen lamoTRIgine (LAMICTAL) 100 MG tablet Take 400 mg by mouth at bedtime.    . Multiple Vitamin (MULTIVITAMIN WITH MINERALS) TABS tablet Take 1 tablet by mouth daily.    . QUEtiapine (SEROQUEL) 400 MG tablet Take 3 tablets (1,200 mg total) by mouth at bedtime. 90 tablet 0  . TANZEUM 50 MG PEN Inject 50 mg into the skin every 7 (seven) days.  2  . temazepam (RESTORIL) 15 MG capsule Take 15 mg by mouth at bedtime as needed for sleep.    Marland Kitchen warfarin (COUMADIN) 7.5 MG tablet Take 1 tablet (7.5 mg total) by mouth daily at 6 PM. 30 tablet 0    Results for orders placed or performed during the hospital encounter of 01/04/16 (from the past 48 hour(s))  Glucose, capillary     Status: Abnormal   Collection Time: 01/15/16  4:39 PM  Result Value Ref Range   Glucose-Capillary 118 (H) 65 - 99 mg/dL  Glucose, capillary     Status: Abnormal   Collection Time: 01/15/16  9:03 PM  Result Value Ref Range   Glucose-Capillary 135 (H) 65 - 99 mg/dL  Glucose, capillary     Status: Abnormal   Collection Time: 01/16/16  6:27 AM  Result Value Ref Range   Glucose-Capillary 100 (H) 65 - 99 mg/dL  Protime-INR  Status: Abnormal   Collection Time: 01/16/16  6:48 AM  Result Value Ref Range   Prothrombin Time 26.8 (H) 11.4 - 15.2 seconds   INR 2.42   Glucose, capillary     Status: Abnormal   Collection Time: 01/16/16 11:45 AM  Result Value Ref Range   Glucose-Capillary 118 (H) 65 - 99 mg/dL   Comment 1 Notify RN   Glucose, capillary     Status: None   Collection Time: 01/16/16  4:33 PM  Result Value Ref Range   Glucose-Capillary 85 65 - 99 mg/dL   Comment 1 Notify RN   Glucose, capillary     Status: Abnormal   Collection Time: 01/16/16  8:35 PM  Result Value Ref Range   Glucose-Capillary 103 (H) 65 - 99 mg/dL  Glucose, capillary     Status: None   Collection Time: 01/17/16  6:48 AM  Result Value Ref Range   Glucose-Capillary 87 65 - 99 mg/dL  Protime-INR     Status: Abnormal   Collection Time: 01/17/16   7:31 AM  Result Value Ref Range   Prothrombin Time 20.8 (H) 11.4 - 15.2 seconds   INR 1.77    No results found.  Review of Systems  Constitutional: Negative.   HENT: Negative.   Eyes: Negative.   Respiratory: Negative.   Cardiovascular: Negative.   Gastrointestinal: Negative.   Musculoskeletal: Negative.   Skin: Negative.   Neurological: Negative.   Psychiatric/Behavioral: Negative for depression, hallucinations, memory loss, substance abuse and suicidal ideas. The patient has insomnia. The patient is not nervous/anxious.     Blood pressure 128/70, pulse 67, temperature 97.6 F (36.4 C), resp. rate 18, height 6' (1.829 m), weight 128.8 kg (284 lb), SpO2 100 %. Physical Exam  Nursing note and vitals reviewed. Constitutional: He appears well-developed and well-nourished.  HENT:  Head: Normocephalic and atraumatic.  Eyes: Conjunctivae are normal. Pupils are equal, round, and reactive to light.  Neck: Normal range of motion.  Cardiovascular: Regular rhythm and normal heart sounds.   Respiratory: Effort normal. No respiratory distress.  GI: Soft.  Musculoskeletal: Normal range of motion.  Neurological: He is alert.  Skin: Skin is warm and dry.  Psychiatric: Thought content normal. His affect is labile. His speech is rapid and/or pressured. He is agitated. Cognition and memory are normal. He expresses impulsivity.     Assessment/Plan Treatment today as well as Friday.  Alethia Berthold, MD 01/17/2016, 10:58 AM

## 2016-01-17 NOTE — Progress Notes (Signed)
D: Pt spends most of the evening in the milieu interacting with staff and peers. Pt continues to display manic behavior. He is tangential with speech. Denies SI/HI/AVH at this time. Pt is NPO for ECT. A: Emotional support and encouragement provided. Medications administered with education. q15 minute safety checks maintained. R: Pt remains free from. Will continue to monitor.

## 2016-01-17 NOTE — Anesthesia Procedure Notes (Signed)
Date/Time: 01/17/2016 10:59 AM Performed by: Johnna Acosta Pre-anesthesia Checklist: Patient identified, Timeout performed, Emergency Drugs available, Suction available and Patient being monitored Patient Re-evaluated:Patient Re-evaluated prior to inductionOxygen Delivery Method: Circle system utilized Preoxygenation: Pre-oxygenation with 100% oxygen Intubation Type: IV induction Ventilation: Mask ventilation throughout procedure

## 2016-01-18 LAB — CBC
HCT: 34.4 % — ABNORMAL LOW (ref 40.0–52.0)
Hemoglobin: 11.4 g/dL — ABNORMAL LOW (ref 13.0–18.0)
MCH: 28.4 pg (ref 26.0–34.0)
MCHC: 33.1 g/dL (ref 32.0–36.0)
MCV: 85.6 fL (ref 80.0–100.0)
PLATELETS: 199 10*3/uL (ref 150–440)
RBC: 4.02 MIL/uL — AB (ref 4.40–5.90)
RDW: 13.4 % (ref 11.5–14.5)
WBC: 2 10*3/uL — AB (ref 3.8–10.6)

## 2016-01-18 LAB — GLUCOSE, CAPILLARY
GLUCOSE-CAPILLARY: 100 mg/dL — AB (ref 65–99)
Glucose-Capillary: 120 mg/dL — ABNORMAL HIGH (ref 65–99)
Glucose-Capillary: 86 mg/dL (ref 65–99)
Glucose-Capillary: 111 mg/dL — ABNORMAL HIGH (ref 65–99)

## 2016-01-18 LAB — PROTIME-INR
INR: 1.43
PROTHROMBIN TIME: 17.6 s — AB (ref 11.4–15.2)

## 2016-01-18 NOTE — Progress Notes (Signed)
Recreation Therapy Notes  Date: 08.17.17 Time: 9:30 am Location: Craft Room  Group Topic: Leisure Education  Goal Area(s) Addresses:  Patient will identify things they are grateful for.  Patient will be educated on why it is important to be grateful.  Behavioral Response: Did not attend  Intervention: Grateful Wheel  Activity: Patients were given an I Am Grateful For worksheet and instructed to write things they are grateful for under each category.  Education: LRT educated patients on why it is important to be grateful.  Education Outcome: Patient did not attend group.   Clinical Observations/Feedback: Patient did not attend group.  [name], LRT/CTRS 01/18/2016 10:25 AM

## 2016-01-18 NOTE — Plan of Care (Signed)
Problem: Coping: Goal: Ability to verbalize feelings will improve Outcome: Progressing Patient is expressing feelings.

## 2016-01-18 NOTE — Plan of Care (Signed)
Problem: Education: Goal: Mental status will improve Outcome: Not Progressing Patient continues to have pressured, rapid speech.  His thoughts are disorganized.

## 2016-01-18 NOTE — Progress Notes (Signed)
[facility] MD Progress Note  01/18/2016 6:56 PM Cameron [name]  MRN:  [id number] Subjective:   Follow-up for this patient with bipolar disorder. Today is Thursday the 17th. Patient is more coherent. He still has some flight of ideas and disorganized thinking but he is better able to express himself. Patient is requesting discharge. I don't think he is yet well enough. We will try and stay in touch with his family. Principal Problem: Bipolar I disorder, current or most recent episode manic, with psychotic features ([place]) Diagnosis:   Patient Active Problem List   Diagnosis Date Noted  . Bipolar affective disorder, current episode manic ([place]) [F31.9] 01/04/2016  . Cellulitis of leg, right [L03.115] 01/01/2016  . Bipolar I disorder, current or most recent episode manic, with psychotic features ([place]) [F31.2] 12/11/2015  . Pulmonary embolism ([place]) [I26.99] 12/04/2015  . Diabetes mellitus without complication ([place]) [id number]   . Hypercholesteremia [E78.00]   . CKD (chronic kidney disease), stage III [N18.3]   . Acute deep vein thrombosis (DVT) of femoral vein of right lower extremity (HCC) [I82.411]    Total Time spent with patient: 20 minutes  Past Psychiatric History: Long history of bipolar disorder multiple manic episodes. History of response to ECT.  Past Medical History:  Past Medical History:  Diagnosis Date  . Bipolar 1 disorder ([place])   . CKD (chronic kidney disease), stage III   . Diabetes mellitus without complication ([place])   . DVT (deep venous thrombosis) ([place])   . Hypercholesteremia   . Hypertension     Past Surgical History:  Procedure Laterality Date  . APPENDECTOMY     Family History:  Family History  Problem Relation Age of Onset  . Stroke Other   . Diabetes Other    Family Psychiatric  History: Positive for bipolar disorder Social History:  History  Alcohol Use  . Yes    Comment: rarely     History  Drug Use No    Social History   Social History  . Marital  status: Married    Spouse name: N/A  . Number of children: N/A  . Years of education: N/A   Social History Main Topics  . Smoking status: Never Smoker  . Smokeless tobacco: Never Used  . Alcohol use Yes     Comment: rarely  . Drug use: No  . Sexual activity: Not Asked   Other Topics Concern  . None   Social History Narrative  . None   Additional Social History:                         Sleep: Fair  Appetite:  Fair  Current Medications: Current Facility-Administered Medications  Medication Dose Route Frequency Provider Last Rate Last Dose  . acetaminophen (TYLENOL) tablet 650 mg  650 mg Oral Q6H PRN Gonzella Lex, MD   650 mg at 01/17/16 0536  . alum & mag hydroxide-simeth (MAALOX/MYLANTA) 200-200-20 MG/5ML suspension 30 mL  30 mL Oral Q4H PRN Gonzella Lex, MD      . atorvastatin (LIPITOR) tablet 40 mg  40 mg Oral q1800 Gonzella Lex, MD   40 mg at 01/18/16 1639  . carbamazepine (TEGRETOL) tablet 1,200 mg  1,200 mg Oral BID PC Gonzella Lex, MD   1,200 mg at 01/18/16 1638  . dextrose 5 % solution 250 mL  250 mL Intravenous Once Gonzella Lex, MD      . enalapril (VASOTEC) tablet 5 mg  5 mg Oral BID Gonzella Lex, MD   5 mg at 01/18/16 0856  . fenofibrate tablet 160 mg  160 mg Oral Daily Gonzella Lex, MD   160 mg at 01/18/16 0856  . glucagon (human recombinant) (GLUCAGEN) injection 1 mg  1 mg Intramuscular Once PRN Gonzella Lex, MD      . haloperidol lactate (HALDOL) injection 20 mg  20 mg Intramuscular Q6H PRN Gonzella Lex, MD      . insulin glargine (LANTUS) injection 30 Units  30 Units Subcutaneous QHS Gonzella Lex, MD   30 Units at 01/17/16 2233  . lamoTRIgine (LAMICTAL) tablet 200 mg  200 mg Oral Daily Gonzella Lex, MD   200 mg at 01/18/16 0856  . lithium carbonate capsule 300 mg  300 mg Oral QHS Gonzella Lex, MD   300 mg at 01/17/16 2231  . LORazepam (ATIVAN) injection 2 mg  2 mg Intramuscular Q6H PRN Gonzella Lex, MD      . LORazepam  (ATIVAN) tablet 1 mg  1 mg Oral Q4H PRN Rainey [place], MD   1 mg at 01/17/16 0545  . magnesium hydroxide (MILK OF MAGNESIA) suspension 30 mL  30 mL Oral Daily PRN Gonzella Lex, MD      . midazolam (VERSED) injection 2 mg  2 mg Intravenous Once Gonzella Lex, MD      . QUEtiapine (SEROQUEL) tablet 1,400 mg  1,400 mg Oral QHS Gonzella Lex, MD   1,400 mg at 01/17/16 2231  . temazepam (RESTORIL) capsule 15 mg  15 mg Oral QHS Gonzella Lex, MD   15 mg at 01/17/16 2232  . warfarin (COUMADIN) tablet 7.5 mg  7.5 mg Oral q1800 Gonzella Lex, MD   7.5 mg at 01/18/16 1639  . Warfarin - Pharmacist Dosing Inpatient   Does not apply q1800 Gonzella Lex, MD   7.5 each at 01/17/16 1800    Lab Results:  Results for orders placed or performed during the hospital encounter of 01/04/16 (from the past 48 hour(s))  Glucose, capillary     Status: Abnormal   Collection Time: 01/16/16  8:35 PM  Result Value Ref Range   Glucose-Capillary 103 (H) 65 - 99 mg/dL  Glucose, capillary     Status: None   Collection Time: 01/17/16  6:48 AM  Result Value Ref Range   Glucose-Capillary 87 65 - 99 mg/dL  Protime-INR     Status: Abnormal   Collection Time: 01/17/16  7:31 AM  Result Value Ref Range   Prothrombin Time 20.8 (H) 11.4 - 15.2 seconds   INR 1.77   Glucose, capillary     Status: Abnormal   Collection Time: 01/17/16  4:13 PM  Result Value Ref Range   Glucose-Capillary 100 (H) 65 - 99 mg/dL   Comment 1 Notify RN   Glucose, capillary     Status: Abnormal   Collection Time: 01/17/16  8:50 PM  Result Value Ref Range   Glucose-Capillary 111 (H) 65 - 99 mg/dL   Comment 1 Notify RN   Glucose, capillary     Status: Abnormal   Collection Time: 01/18/16  6:28 AM  Result Value Ref Range   Glucose-Capillary 100 (H) 65 - 99 mg/dL   Comment 1 Notify RN   Protime-INR     Status: Abnormal   Collection Time: 01/18/16  6:44 AM  Result Value Ref Range   Prothrombin Time 17.6 (H) 11.4 - 15.2 seconds  INR 1.43   CBC      Status: Abnormal   Collection Time: 01/18/16  6:44 AM  Result Value Ref Range   WBC 2.0 (L) 3.8 - 10.6 K/uL   RBC 4.02 (L) 4.40 - 5.90 MIL/uL   Hemoglobin 11.4 (L) 13.0 - 18.0 g/dL   HCT 34.4 (L) 40.0 - 52.0 %   MCV 85.6 80.0 - 100.0 fL   MCH 28.4 26.0 - 34.0 pg   MCHC 33.1 32.0 - 36.0 g/dL   RDW 13.4 11.5 - 14.5 %   Platelets 199 150 - 440 K/uL  Glucose, capillary     Status: Abnormal   Collection Time: 01/18/16 11:38 AM  Result Value Ref Range   Glucose-Capillary 120 (H) 65 - 99 mg/dL  Glucose, capillary     Status: None   Collection Time: 01/18/16  4:37 PM  Result Value Ref Range   Glucose-Capillary 86 65 - 99 mg/dL    Blood Alcohol level:  Lab Results  Component Value Date   ETH <5 [id number]    Metabolic Disorder Labs: Lab Results  Component Value Date   HGBA1C 7.4 (H) 01/02/2016   MPG 192 12/04/2015   MPG 318 (H) 08/24/2010   Lab Results  Component Value Date   PROLACTIN 4.4 12/11/2015   Lab Results  Component Value Date   CHOL 143 12/11/2015   TRIG 286 (H) 12/11/2015   HDL 32 (L) 12/11/2015   CHOLHDL 4.5 12/11/2015   VLDL 57 (H) 12/11/2015   LDLCALC 54 12/11/2015   LDLCALC 56 12/05/2015    Physical Findings: AIMS: Facial and Oral Movements Muscles of Facial Expression: None, normal Lips and Perioral Area: None, normal Jaw: None, normal Tongue: None, normal,Extremity Movements Upper (arms, wrists, hands, fingers): None, normal Lower (legs, knees, ankles, toes): None, normal, Trunk Movements Neck, shoulders, hips: None, normal, Overall Severity Severity of abnormal movements (highest score from questions above): None, normal Incapacitation due to abnormal movements: None, normal Patient's awareness of abnormal movements (rate only patient's report): No Awareness, Dental Status Current problems with teeth and/or dentures?: No Does patient usually wear dentures?: No  CIWA:    COWS:     Musculoskeletal: Strength & Muscle Tone: within normal  limits Gait & Station: normal Patient leans: N/A  Psychiatric Specialty Exam: Physical Exam  Nursing note and vitals reviewed. Constitutional: He appears well-developed and well-nourished.  HENT:  Head: Normocephalic and atraumatic.  Eyes: Conjunctivae are normal. Pupils are equal, round, and reactive to light.  Neck: Normal range of motion.  Cardiovascular: Normal heart sounds.   Respiratory: Effort normal. No respiratory distress.  GI: Soft.  Musculoskeletal: Normal range of motion.  Neurological: He is alert.  Skin: Skin is warm and dry.     Psychiatric: His affect is labile and inappropriate. His speech is tangential. He is hyperactive. Thought content is delusional. He expresses impulsivity. He expresses no homicidal and no suicidal ideation. He exhibits abnormal recent memory. He is inattentive.    Review of Systems  Constitutional: Negative.  Negative for chills, fever, malaise/fatigue and weight loss.  HENT: Negative.  Negative for hearing loss, nosebleeds, sore throat and tinnitus.   Eyes: Negative.  Negative for blurred vision and double vision.  Respiratory: Negative.  Negative for cough, hemoptysis, sputum production, shortness of breath and wheezing.   Cardiovascular: Negative.  Negative for chest pain, palpitations and claudication.  Gastrointestinal: Negative.  Negative for abdominal pain, blood in stool, constipation, diarrhea, heartburn, nausea and vomiting.  Genitourinary: Negative for dysuria,  frequency and urgency.  Musculoskeletal: Negative.  Negative for back pain, falls, joint pain, myalgias and neck pain.  Skin: Negative.  Negative for itching and rash.  Neurological: Negative.  Negative for dizziness, tingling, tremors, focal weakness, seizures and headaches.  Endo/Heme/Allergies: Negative for environmental allergies. Does not bruise/bleed easily.  Psychiatric/Behavioral: Negative for depression, hallucinations, memory loss, substance abuse and suicidal  ideas. The patient is not nervous/anxious and does not have insomnia.     Blood pressure 117/70, pulse 71, temperature 98.3 F (36.8 C), resp. rate 18, height 6' (1.829 m), weight 128.8 kg (284 lb), SpO2 100 %.Body mass index is 38.52 kg/m.  General Appearance: Disheveled  Eye Contact:  Fair  Speech:  Pressured  Volume:  Increased  Mood:  Euphoric  Affect:  Labile  Thought Process:  Disorganized  Orientation:  Full (Time, Place, and Person)  Thought Content:  Illogical, Delusions, Rumination and Tangential  Suicidal Thoughts:  No  Homicidal Thoughts:  No  Memory:  Immediate;   Fair Recent;   Poor Remote;   Fair  Judgement:  Impaired  Insight:  Shallow  Psychomotor Activity:  Increased  Concentration:  Concentration: Poor  Recall:  [place] of Knowledge:  Good  Language:  Good  Akathisia:  No  Handed:  Right  AIMS (if indicated):     Assets:  Financial Resources/Insurance Housing Social Support  ADL's:  Impaired  Cognition:  Impaired,  Mild  Sleep:  Number of Hours: 6     Treatment Plan Summary:  Patient is improving. Not aggressive or dangerous today. Still disorganized thinking difficulty putting full ideas together some grandiosity. He is requesting discharge. He is getting more irritable but I am not discharging him as soon as he would like. I still think we should pursue ECT with another treatment tomorrow and he will be put on the schedule. No change to medication today.   I am going to increase his Seroquel tonight to 1400 mg. He is tolerating the high dose without any side effects.   Hyperlipidemia: We'll plan to continue Lipitor 40 mg by mouth daily and fenofibrate 160 mg by mouth daily  Cellulitis of the leg: Will continue Augmentin 1 tablet by mouth twice a day. Cellulitis is slowly improving.  History of pulmonary embolism: We'll continue Coumadin 8 mg by mouth nightly.  Disposition: The patient will be discharged home with his wife because he does have  a stable living situation. Psychotropic medication management follow-up appointment as well as individual therapy will beScheduled with Dr. Thurmond Butts.    scheduled with Dr. Thurmond Butts.Alethia Berthold, MD 01/18/2016, 6:56 PM

## 2016-01-18 NOTE — Progress Notes (Signed)
Patient ID: Cameron [name], male   DOB: Mar 03, 1966, 50 y.o.   MRN: [id number] D: Patient continues to have rapid, pressured speech. He is pleasant with staff and peers.  His thoughts are disorganized and tangental.  He rambles at times.  He denies any thoughts of self harm.  He denies HI/AVH.  He is compliant with his medications.  He is knowledgeable about his medications and the indications for same.  Patient is attending groups and participating.   A: Continue to monitor medication management and MD orders.  Safety checks completed every 15 minutes per protocol.  Offer support and encouragement as needed. R: Patient is receptive to staff; his behavior is appropriate.

## 2016-01-18 NOTE — BHH Group Notes (Signed)
[facility] LCSW Group Therapy  01/18/2016 3:16 PM  Type of Therapy:  Group Therapy  Participation Level:  Active  Participation Quality:  Attentive  Affect:  Appropriate  Cognitive:  Alert  Insight:  Distracting and Engaged  Engagement in Therapy:  Limited  Modes of Intervention:  Discussion, Education and Support  Summary of Progress/Problems:Balance in life: Patients will discuss the concept of balance and how it looks and feels to be unbalanced. Pt will identify areas in their life that is unbalanced and ways to become more balanced. Pt needed redirection to stay on topic and to remain appropriate. Pt stated he needed to have constant amounts of sleep daily to stay balanced.   Ivory Maduro G. [place], [place] 01/18/2016, 3:26 PM

## 2016-01-18 NOTE — BHH Group Notes (Signed)
[facility] LCSW Group Therapy Note  Type of Therapy and Topic:  Group Therapy:  Goals Group: SMART Goals  Participation Level:  Patient attended group on this date. Patient participated in goal setting and was able to share openly with the group.   Description of Group:    The purpose of a daily goals group is to assist and guide patients in setting recovery/wellness-related goals.  The objective is to set goals as they relate to the crisis in which they were admitted. Patients will be using SMART goal modalities to set measurable goals.  Characteristics of realistic goals will be discussed and patients will be assisted in setting and processing how one will reach their goal. Facilitator will also assist patients in applying interventions and coping skills learned in psycho-education groups to the SMART goal and process how one will achieve defined goal.  Therapeutic Goals: -Patients will develop and document one goal related to or their crisis in which brought them into treatment. -Patients will be guided by LCSW using SMART goal setting modality in how to set a measurable, attainable, realistic and time sensitive goal.  -Patients will process barriers in reaching goal. -Patients will process interventions in how to overcome and successful in reaching goal.   Summary of Patient Progress:  Patient Goal: Patient had difficulty stating a goal and need redirection to stay on topic. Patient was unable to establish a goal for himself.    Therapeutic Modalities:   Motivational Interviewing  Public relations account executive Therapy Crisis Intervention Model SMART goals setting  Sebastion Jun G. [place], [place] 01/18/2016 11:45 AM

## 2016-01-18 NOTE — Progress Notes (Signed)
D: Pt denies SI/HI/AVH. Pt is pleasant and cooperative. Pt stated he feels better from doing ECT, he appears less anxious and he is interacting with peers and staff appropriately. Patient's thoughts are less disorganized, speech is less pressured, affect is bright, no bizarre behavior noted.  A: Pt was offered support and encouragement. Pt was given scheduled medications. Pt was encouraged to attend groups. Q 15 minute checks were done for safety.  R:Pt attends groups and interacts well with peers and staff. Pt is taking medication. Pt has no complaints.Pt receptive to treatment and safety maintained on unit.

## 2016-01-18 NOTE — BHH Group Notes (Signed)
[facility] Group Notes:  (Nursing/MHT/Case Management/Adjunct)  Date:  01/18/2016  Time:  4:20 PM  Type of Therapy:  Psychoeducational Skills  Participation Level:  Active  Participation Quality:  Monopolizing and Redirectable  Affect:  Appropriate  Cognitive:  Appropriate  Insight:  Improving  Engagement in Group:  Monopolizing  Modes of Intervention:  Discussion and Education  Summary of Progress/Problems:  Charise Killian 01/18/2016, 4:20 PM

## 2016-01-18 NOTE — Progress Notes (Signed)
ANTICOAGULATION CONSULT NOTE - Follow Up Consult  Pharmacy Consult for Warfarin Indication: VTE treatment  Allergies  Allergen Reactions  . Asa [Aspirin] Other (See Comments)    Patient tolerates LOW DOSE ASPIRIN.    Patient Measurements: Height: 6' (182.9 cm) Weight: 284 lb (128.8 kg) IBW/kg (Calculated) : 77.6  Vital Signs: Temp: 98.3 F (36.8 C) (08/17 0700) BP: 117/70 (08/17 0700) Pulse Rate: 71 (08/17 0700)  Labs:  Recent Labs  01/16/16 0648 01/17/16 0731 01/18/16 0644  HGB  --   --  11.4*  HCT  --   --  34.4*  PLT  --   --  199  LABPROT 26.8* 20.8* 17.6*  INR 2.42 1.77 1.43   Estimated Creatinine Clearance: 75.8 mL/min (by C-G formula based on SCr of 1.6 mg/dL).  Medical History: Past Medical History:  Diagnosis Date  . Bipolar 1 disorder ([place])   . CKD (chronic kidney disease), stage III   . Diabetes mellitus without complication ([place])   . DVT (deep venous thrombosis) ([place])   . Hypercholesteremia   . Hypertension    Medications:  Warfarin   Assessment: 70 yom admitted to ED [facility] with manic symptoms. Recently discharged from [facility] with DVT/PE, had been bridging LMWH and VKA prior to admission.  INR History: 7/9: INR - 1.99 7/10: no INR- warfarin 12.5 mg po daily at 0200 7/11: INR - 2.97- warfarin held 7/12: INR - 1.92 - warfarin 10 mg 7/13: INR - 1.95 - warfarin 10 mg 7/14: INR - 2.56 - warfarin 5 mg 7/15: INR - 2.73 - warfarin 5 mg 7/16: INR - 2.39 - warfarin 7.5 mg 7/17: INR - 2.22 - warfarin 7.5 mg 7/18: INR - 2.53 - warfarin 7.5 mg 7/19: INR - 2.75 - warfarin 7.5 mg 7/20: INR - 2.99 - warfarin 6.5 mg 7/21: INR - 2.64 - warfarin 6.5 mg  7/22: INR: 2.44; warfarin 8  7/23: INR: 2.60;  Warfarin 10mg  7/24: INR 2.44 - warfarin 10mg  7/25:  INR 3.22 - warfarin 10mg  7/26:  INR 3.08 - warfarin 8mg  7/27:  INR 3.24 - warfarin 8 mg 7/28:  INR 2.79 - 8 mg 7/29:  INR 2.74- 8 mg 7/30:  INR 3.19 - 7 mg 7/31:  INR 2.73  7.5mg  8/1:    INR 3.13   7.5mg  8/2:    INR 2.74  7.5mg  8/3     INR 2.27  7.5mg  8/4     INR 3.19  8 mg 8/5     INR 3.04  7.5 mg 8/6     INR 3.05  7.5mg  8/7:    INR  3.22  6mg  8/8:    INR  3.85  Dose held per protocol 8/9:    INR 2.4     6mg  8/10:  INR 1.93   7mg   8/11:  INR  2.00  7mg  8/12:  INR  2.26  6 mg 8/13:  INR 2.22   7 mg 8/14:  INR 2.18   7mg  8/15:  INR 2.42   NONE given 8/16:  INR 1.77  7.5mg   8/17:  INR 1.43  Goal of Therapy:  INR 2-3 Monitor platelets by anticoagulation protocol: Yes   Plan:  INR is subtherapeutic today as a result of missing does on 8/17. Will give warfarin 7.5 mg today and f/u AM INR. Also order CBC for tomorrow  (Patient with orders for carbamazepine which interacts with warfarin to decrease the INR. Patient has orders for fenofibrate which interacts with warfarin to  increase the INR. Will follow INR closely.)   Pt will need CBC q3 days per policy. Pharmacy will continue to follow.   Cheri Guppy, RPH 01/18/2016,2:00 PM

## 2016-01-19 ENCOUNTER — Inpatient Hospital Stay: Payer: 59 | Admitting: Anesthesiology

## 2016-01-19 ENCOUNTER — Encounter: Payer: Self-pay | Admitting: Anesthesiology

## 2016-01-19 ENCOUNTER — Other Ambulatory Visit: Payer: Self-pay | Admitting: Psychiatry

## 2016-01-19 LAB — GLUCOSE, CAPILLARY
GLUCOSE-CAPILLARY: 79 mg/dL (ref 65–99)
GLUCOSE-CAPILLARY: 85 mg/dL (ref 65–99)
GLUCOSE-CAPILLARY: 84 mg/dL (ref 65–99)
GLUCOSE-CAPILLARY: 173 mg/dL — AB (ref 65–99)
Glucose-Capillary: 130 mg/dL — ABNORMAL HIGH (ref 65–99)

## 2016-01-19 MED ORDER — LITHIUM CARBONATE 300 MG PO CAPS
600.0000 mg | ORAL_CAPSULE | Freq: Every day | ORAL | Status: DC
  Administered 2016-01-19 – 2016-01-22 (×4): 600 mg via ORAL
  Filled 2016-01-19 (×4): qty 2

## 2016-01-19 NOTE — BHH Group Notes (Signed)
[facility] Group Notes:  (Nursing/MHT/Case Management/Adjunct)  Date:  01/19/2016  Time:  4:22 AM  Type of Therapy:  Evening Wrap-up Group  Participation Level:  Active  Participation Quality:  Appropriate  Affect:  Appropriate  Cognitive:  Alert and Appropriate  Insight:  Appropriate, Good and Improving  Engagement in Group:  Engaged  Modes of Intervention:  Activity  Summary of Progress/Problems:  Levonne Spiller 01/19/2016, 4:22 AM

## 2016-01-19 NOTE — BHH Group Notes (Signed)
[facility] LCSW Group Therapy  01/19/2016 11:32 AM  Type of Therapy:  Group Therapy  Participation Level:  Pt did not attend group. CSW invited pt to group.   Summary of Progress/Problems: Feelings around Relapse. Group members discussed the meaning of relapse and shared personal stories of relapse, how it affected them and others, and how they perceived themselves during this time. Group members were encouraged to identify triggers, warning signs and coping skills used when facing the possibility of relapse. Social supports were discussed and explored in detail. Patients also discussed facing disappointment and how that can trigger someone to relapse.   Nova Schmuhl G. [place], [place] 01/19/2016, 11:32 AM

## 2016-01-19 NOTE — BHH Group Notes (Signed)
[facility] Group Notes:  (Nursing/MHT/Case Management/Adjunct)  Date:  01/19/2016  Time:  4:26 PM  Type of Therapy:  Psychoeducational Skills  Participation Level:  Active  Participation Quality:  Monopolizing and Redirectable  Affect:  Appropriate  Cognitive:  Appropriate  Insight:  Improving  Engagement in Group:  Monopolizing  Modes of Intervention:  Discussion and Education  Summary of Progress/Problems:  Cameron [name] 01/19/2016, 4:26 PM

## 2016-01-19 NOTE — Progress Notes (Signed)
D: Pt denies SI/HI/AVH, affect is blunted,  thoughts process are circumstantial with some flight of ideas noted,  his thought content however was not delusional.   Pt is pleasant and cooperative with treatment plan. Pt stated he feels better from doing ECT, he appears less anxious and he is interacting with peers and staff appropriately.  A: Pt was offered support and encouragement. Pt was given scheduled medications. Pt was encouraged to attend groups. Q 15 minute checks were done for safety.  R:Pt attends groups and interacts well with peers and staff. Pt is taking medication. Pt has no complaints.Pt receptive to treatment and safety maintained on unit.

## 2016-01-19 NOTE — Progress Notes (Signed)
[facility] MD Progress Note  01/19/2016 7:07 PM Cameron [name]  MRN:  [id number] Subjective:  Follow-up for this patient on Friday the 18th. Patient was scheduled to have ECT this morning. After extended periods of discussion with myself and with nursing the patient remained adamant that he would refuse ECT. His reasons were irrational. We did not attempt to force treatment however as I did not think that the benefit was worth the potential danger of forcing treatment. I have explained to the patient that by refusing ECT at think he is working against his desire to be discharged from the hospital. He certainly is better than he was originally but remains very disorganized in his thinking with flight of ideas. Principal Problem: Bipolar I disorder, current or most recent episode manic, with psychotic features ([place]) Diagnosis:   Patient Active Problem List   Diagnosis Date Noted  . Bipolar affective disorder, current episode manic ([place]) [F31.9] 01/04/2016  . Cellulitis of leg, right [L03.115] 01/01/2016  . Bipolar I disorder, current or most recent episode manic, with psychotic features ([place]) [F31.2] 12/11/2015  . Pulmonary embolism ([place]) [I26.99] 12/04/2015  . Diabetes mellitus without complication ([place]) [id number]   . Hypercholesteremia [E78.00]   . CKD (chronic kidney disease), stage III [N18.3]   . Acute deep vein thrombosis (DVT) of femoral vein of right lower extremity (HCC) [I82.411]    Total Time spent with patient: 20 minutes  Past Psychiatric History: Long history of bipolar disorder multiple manic episodes. History of response to ECT.  Past Medical History:  Past Medical History:  Diagnosis Date  . Bipolar 1 disorder ([place])   . CKD (chronic kidney disease), stage III   . Diabetes mellitus without complication ([place])   . DVT (deep venous thrombosis) ([place])   . Hypercholesteremia   . Hypertension     Past Surgical History:  Procedure Laterality Date  . APPENDECTOMY     Family History:    Family History  Problem Relation Age of Onset  . Stroke Other   . Diabetes Other    Family Psychiatric  History: Positive for bipolar disorder Social History:  History  Alcohol Use  . Yes    Comment: rarely     History  Drug Use No    Social History   Social History  . Marital status: Married    Spouse name: N/A  . Number of children: N/A  . Years of education: N/A   Social History Main Topics  . Smoking status: Never Smoker  . Smokeless tobacco: Never Used  . Alcohol use Yes     Comment: rarely  . Drug use: No  . Sexual activity: Not Asked   Other Topics Concern  . None   Social History Narrative  . None   Additional Social History:                         Sleep: Fair  Appetite:  Fair  Current Medications: Current Facility-Administered Medications  Medication Dose Route Frequency Provider Last Rate Last Dose  . acetaminophen (TYLENOL) tablet 650 mg  650 mg Oral Q6H PRN Gonzella Lex, MD   650 mg at 01/17/16 0536  . alum & mag hydroxide-simeth (MAALOX/MYLANTA) 200-200-20 MG/5ML suspension 30 mL  30 mL Oral Q4H PRN Gonzella Lex, MD      . atorvastatin (LIPITOR) tablet 40 mg  40 mg Oral q1800 Gonzella Lex, MD   40 mg at 01/19/16 1659  . carbamazepine (  TEGRETOL) tablet 1,200 mg  1,200 mg Oral BID PC Gonzella Lex, MD   1,200 mg at 01/19/16 1659  . dextrose 5 % solution 250 mL  250 mL Intravenous Once Gonzella Lex, MD      . enalapril (VASOTEC) tablet 5 mg  5 mg Oral BID Gonzella Lex, MD   5 mg at 01/19/16 [id number]  . fenofibrate tablet 160 mg  160 mg Oral Daily Gonzella Lex, MD   160 mg at 01/19/16 1043  . glucagon (human recombinant) (GLUCAGEN) injection 1 mg  1 mg Intramuscular Once PRN Gonzella Lex, MD      . haloperidol lactate (HALDOL) injection 20 mg  20 mg Intramuscular Q6H PRN Gonzella Lex, MD      . insulin glargine (LANTUS) injection 30 Units  30 Units Subcutaneous QHS Gonzella Lex, MD   30 Units at 01/18/16 2207  . lamoTRIgine  (LAMICTAL) tablet 200 mg  200 mg Oral Daily Gonzella Lex, MD   200 mg at 01/19/16 1043  . lithium carbonate capsule 300 mg  300 mg Oral QHS Gonzella Lex, MD   300 mg at 01/18/16 2207  . LORazepam (ATIVAN) injection 2 mg  2 mg Intramuscular Q6H PRN Gonzella Lex, MD      . LORazepam (ATIVAN) tablet 1 mg  1 mg Oral Q4H PRN Rainey Pines, MD   1 mg at 01/17/16 0545  . magnesium hydroxide (MILK OF MAGNESIA) suspension 30 mL  30 mL Oral Daily PRN Gonzella Lex, MD      . midazolam (VERSED) injection 2 mg  2 mg Intravenous Once Gonzella Lex, MD      . QUEtiapine (SEROQUEL) tablet 1,400 mg  1,400 mg Oral QHS Gonzella Lex, MD   1,400 mg at 01/18/16 2207  . temazepam (RESTORIL) capsule 15 mg  15 mg Oral QHS Gonzella Lex, MD   15 mg at 01/18/16 2207  . warfarin (COUMADIN) tablet 7.5 mg  7.5 mg Oral q1800 Gonzella Lex, MD   7.5 mg at 01/19/16 1659  . Warfarin - Pharmacist Dosing Inpatient   Does not apply q1800 Gonzella Lex, MD        Lab Results:  Results for orders placed or performed during the hospital encounter of 01/04/16 (from the past 48 hour(s))  Glucose, capillary     Status: Abnormal   Collection Time: 01/17/16  8:50 PM  Result Value Ref Range   Glucose-Capillary 111 (H) 65 - 99 mg/dL   Comment 1 Notify RN   Glucose, capillary     Status: Abnormal   Collection Time: 01/18/16  6:28 AM  Result Value Ref Range   Glucose-Capillary 100 (H) 65 - 99 mg/dL   Comment 1 Notify RN   Protime-INR     Status: Abnormal   Collection Time: 01/18/16  6:44 AM  Result Value Ref Range   Prothrombin Time 17.6 (H) 11.4 - 15.2 seconds   INR 1.43   CBC     Status: Abnormal   Collection Time: 01/18/16  6:44 AM  Result Value Ref Range   WBC 2.0 (L) 3.8 - 10.6 K/uL   RBC 4.02 (L) 4.40 - 5.90 MIL/uL   Hemoglobin 11.4 (L) 13.0 - 18.0 g/dL   HCT 34.4 (L) 40.0 - 52.0 %   MCV 85.6 80.0 - 100.0 fL   MCH 28.4 26.0 - 34.0 pg   MCHC 33.1 32.0 - 36.0 g/dL   RDW  13.4 11.5 - 14.5 %   Platelets 199 150  - 440 K/uL  Glucose, capillary     Status: Abnormal   Collection Time: 01/18/16 11:38 AM  Result Value Ref Range   Glucose-Capillary 120 (H) 65 - 99 mg/dL  Glucose, capillary     Status: None   Collection Time: 01/18/16  4:37 PM  Result Value Ref Range   Glucose-Capillary 86 65 - 99 mg/dL  Glucose, capillary     Status: Abnormal   Collection Time: 01/18/16  9:37 PM  Result Value Ref Range   Glucose-Capillary 111 (H) 65 - 99 mg/dL  Glucose, capillary     Status: None   Collection Time: 01/19/16  6:37 AM  Result Value Ref Range   Glucose-Capillary 79 65 - 99 mg/dL  Glucose, capillary     Status: Abnormal   Collection Time: 01/19/16 11:41 AM  Result Value Ref Range   Glucose-Capillary 130 (H) 65 - 99 mg/dL   Comment 1 Notify RN   Glucose, capillary     Status: None   Collection Time: 01/19/16  5:01 PM  Result Value Ref Range   Glucose-Capillary 85 65 - 99 mg/dL    Blood Alcohol level:  Lab Results  Component Value Date   ETH <5 [id number]    Metabolic Disorder Labs: Lab Results  Component Value Date   HGBA1C 7.4 (H) 01/02/2016   MPG 192 12/04/2015   MPG 318 (H) 08/24/2010   Lab Results  Component Value Date   PROLACTIN 4.4 12/11/2015   Lab Results  Component Value Date   CHOL 143 12/11/2015   TRIG 286 (H) 12/11/2015   HDL 32 (L) 12/11/2015   CHOLHDL 4.5 12/11/2015   VLDL 57 (H) 12/11/2015   LDLCALC 54 12/11/2015   LDLCALC 56 12/05/2015    Physical Findings: AIMS: Facial and Oral Movements Muscles of Facial Expression: None, normal Lips and Perioral Area: None, normal Jaw: None, normal Tongue: None, normal,Extremity Movements Upper (arms, wrists, hands, fingers): None, normal Lower (legs, knees, ankles, toes): None, normal, Trunk Movements Neck, shoulders, hips: None, normal, Overall Severity Severity of abnormal movements (highest score from questions above): None, normal Incapacitation due to abnormal movements: None, normal Patient's awareness of  abnormal movements (rate only patient's report): No Awareness, Dental Status Current problems with teeth and/or dentures?: No Does patient usually wear dentures?: No  CIWA:    COWS:     Musculoskeletal: Strength & Muscle Tone: within normal limits Gait & Station: normal Patient leans: N/A  Psychiatric Specialty Exam: Physical Exam  Nursing note and vitals reviewed. Constitutional: He appears well-developed and well-nourished.  HENT:  Head: Normocephalic and atraumatic.  Eyes: Conjunctivae are normal. Pupils are equal, round, and reactive to light.  Neck: Normal range of motion.  Cardiovascular: Normal heart sounds.   Respiratory: Effort normal. No respiratory distress.  GI: Soft.  Musculoskeletal: Normal range of motion.  Neurological: He is alert.  Skin: Skin is warm and dry.     Psychiatric: His affect is labile and inappropriate. His speech is tangential. He is hyperactive. Thought content is delusional. He expresses impulsivity. He expresses no homicidal and no suicidal ideation. He exhibits abnormal recent memory. He is inattentive.    Review of Systems  Constitutional: Negative.  Negative for chills, fever, malaise/fatigue and weight loss.  HENT: Negative.  Negative for hearing loss, nosebleeds, sore throat and tinnitus.   Eyes: Negative.  Negative for blurred vision and double vision.  Respiratory: Negative.  Negative for cough, hemoptysis,  sputum production, shortness of breath and wheezing.   Cardiovascular: Negative.  Negative for chest pain, palpitations and claudication.  Gastrointestinal: Negative.  Negative for abdominal pain, blood in stool, constipation, diarrhea, heartburn, nausea and vomiting.  Genitourinary: Negative for dysuria, frequency and urgency.  Musculoskeletal: Negative.  Negative for back pain, falls, joint pain, myalgias and neck pain.  Skin: Negative.  Negative for itching and rash.  Neurological: Negative.  Negative for dizziness, tingling,  tremors, focal weakness, seizures and headaches.  Endo/Heme/Allergies: Negative for environmental allergies. Does not bruise/bleed easily.  Psychiatric/Behavioral: Negative for depression, hallucinations, memory loss, substance abuse and suicidal ideas. The patient is not nervous/anxious and does not have insomnia.     Blood pressure (!) 132/92, pulse 86, temperature 98.4 F (36.9 C), resp. rate 16, height 6' (1.829 m), weight 127.9 kg (282 lb), SpO2 100 %.Body mass index is 38.25 kg/m.  General Appearance: Disheveled  Eye Contact:  Fair  Speech:  Pressured  Volume:  Increased  Mood:  Euphoric  Affect:  Labile  Thought Process:  Disorganized  Orientation:  Full (Time, Place, and Person)  Thought Content:  Illogical, Delusions, Rumination and Tangential  Suicidal Thoughts:  No  Homicidal Thoughts:  No  Memory:  Immediate;   Fair Recent;   Poor Remote;   Fair  Judgement:  Impaired  Insight:  Shallow  Psychomotor Activity:  Increased  Concentration:  Concentration: Poor  Recall:  [place] of Knowledge:  Good  Language:  Good  Akathisia:  No  Handed:  Right  AIMS (if indicated):     Assets:  Financial Resources/Insurance Housing Social Support  ADL's:  Impaired  Cognition:  Impaired,  Mild  Sleep:  Number of Hours: 7.15     Treatment Plan Summary:  Fortunately did not receive ECT today. We will not be having treatments this upcoming week. I am going to increase his lithium dose to 600 mg at night. He appears to be tolerating the current dose without any difficulty at all. Blood sugars are improved. Vital signs stable. No other change to medicine for today. Again, the patient remains very disorganized with flight of ideas and irrational thinking making it impossible for him to carry on normal behavior outside of a contained setting. I am going to increase his Seroquel tonight to 1400 mg. He is tolerating the high dose without any side effects.   Hyperlipidemia: We'll plan  to continue Lipitor 40 mg by mouth daily and fenofibrate 160 mg by mouth daily  Cellulitis of the leg: Will continue Augmentin 1 tablet by mouth twice a day. Cellulitis is slowly improving.  History of pulmonary embolism: We'll continue Coumadin 8 mg by mouth nightly.  Disposition: The patient will be discharged home with his wife because he does have a stable living situation. Psychotropic medication management follow-up appointment as well as individual therapy will beScheduled with Dr. Thurmond Butts.    scheduled with Dr. Thurmond Butts.Alethia Berthold, MD 01/19/2016, 7:07 PM

## 2016-01-19 NOTE — Progress Notes (Signed)
Recreation Therapy Notes  Date: 08.18.17 Time: 1:00 pm Location: Craft Room  Group Topic: Self-expression, Coping Skills  Goal Area(s) Addresses:  Patient will effectively use art as a means of self-expression. Patient will recognize positive benefit of self-expression. Patient will be able to identify one emotion experienced during group session. Patient will identify use of art as a coping skill.  Behavioral Response: Attentive, Interactive, Off topic  Intervention: Two Faces of Me  Activity: Patients were given a blank face worksheet and instructed to draw a line down the middle. On one side of the face, patients were instructed to draw or write how they felt when they were admitted to the hospital. On the other side of the face, patients were instructed to draw or write how they want to feel when they are d/c.  Education: LRT educated patients on healthy coping skills.  Education Outcome: In group clarification offered  Clinical Observations/Feedback: Patient drew a self-portrait and started to color it in. Patient attempted to contribute to group discussion. Patient was off topic.  [name], LRT/CTRS 01/19/2016 3:15 PM

## 2016-01-19 NOTE — Progress Notes (Signed)
10am Dr Weber Cooks in to see pt.  Pt continues to refuse to have ECT treatment.  Pt having increased word salad today.  Not responding appropriately to questions asked.  1045 Pt transferred back to behavior med rm 312 via w/c by orderly Roselyn Reef.  Report given to [facility]

## 2016-01-19 NOTE — Progress Notes (Signed)
0830 Pt came to ECT for treatment but refused to have anything done.  Dr Weber Cooks called and informed of Garl's refusal to have ECT treatment today.

## 2016-01-19 NOTE — Plan of Care (Signed)
Problem: Coping: Goal: Ability to verbalize frustrations and anger appropriately will improve Outcome: Progressing Patient is able to verbalize anger and frustrations to Probation officer.
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
## 2016-01-19 NOTE — Anesthesia Preprocedure Evaluation (Deleted)
Anesthesia Evaluation  Patient identified by MRN, date of birth, ID band Patient awake    Reviewed: Allergy & Precautions, NPO status , Patient's Chart, lab work & pertinent test results, reviewed documented beta blocker date and time   Airway Mallampati: III  TM Distance: >3 FB     Dental  (+) Chipped   Pulmonary           Cardiovascular hypertension, Pt. on medications + Peripheral Vascular Disease       Neuro/Psych PSYCHIATRIC DISORDERS Bipolar Disorder    GI/Hepatic   Endo/Other  diabetes, Type 2  Renal/GU Renal disease     Musculoskeletal   Abdominal   Peds  Hematology   Anesthesia Other Findings Obese.  Reproductive/Obstetrics                             Anesthesia Physical Anesthesia Plan  ASA: III  Anesthesia Plan: General   Post-op Pain Management:    Induction: Intravenous  Airway Management Planned: Mask  Additional Equipment:   Intra-op Plan:   Post-operative Plan:   Informed Consent: I have reviewed the patients History and Physical, chart, labs and discussed the procedure including the risks, benefits and alternatives for the proposed anesthesia with the patient or authorized representative who has indicated his/her understanding and acceptance.     Plan Discussed with: CRNA  Anesthesia Plan Comments:         Anesthesia Quick Evaluation

## 2016-01-19 NOTE — Plan of Care (Signed)
Problem: Education: Goal: Mental status will improve Outcome: Progressing Pt has decreased mania. Patient allows you to speak while in conversation.

## 2016-01-19 NOTE — Progress Notes (Signed)
ANTICOAGULATION CONSULT NOTE - Follow Up Consult  Pharmacy Consult for Warfarin Indication: VTE treatment  Allergies  Allergen Reactions  . Asa [Aspirin] Other (See Comments)    Patient tolerates LOW DOSE ASPIRIN.    Patient Measurements: Height: 6' (182.9 cm) Weight: 282 lb (127.9 kg) IBW/kg (Calculated) : 77.6  Vital Signs: BP: 137/78 (08/18 2201)  Labs:  Recent Labs  01/17/16 0731 01/18/16 0644  HGB  --  11.4*  HCT  --  34.4*  PLT  --  199  LABPROT 20.8* 17.6*  INR 1.77 1.43   Estimated Creatinine Clearance: 75.5 mL/min (by C-G formula based on SCr of 1.6 mg/dL).  Medical History: Past Medical History:  Diagnosis Date  . Bipolar 1 disorder ([place])   . CKD (chronic kidney disease), stage III   . Diabetes mellitus without complication ([place])   . DVT (deep venous thrombosis) ([place])   . Hypercholesteremia   . Hypertension    Medications:  Warfarin   Assessment: 83 yom admitted to ED BHU with manic symptoms. Recently discharged from [facility] with DVT/PE, had been bridging LMWH and VKA prior to admission.  INR History: 7/9: INR - 1.99 7/10: no INR- warfarin 12.5 mg po daily at 0200 7/11: INR - 2.97- warfarin held 7/12: INR - 1.92 - warfarin 10 mg 7/13: INR - 1.95 - warfarin 10 mg 7/14: INR - 2.56 - warfarin 5 mg 7/15: INR - 2.73 - warfarin 5 mg 7/16: INR - 2.39 - warfarin 7.5 mg 7/17: INR - 2.22 - warfarin 7.5 mg 7/18: INR - 2.53 - warfarin 7.5 mg 7/19: INR - 2.75 - warfarin 7.5 mg 7/20: INR - 2.99 - warfarin 6.5 mg 7/21: INR - 2.64 - warfarin 6.5 mg  7/22: INR: 2.44; warfarin 8  7/23: INR: 2.60;  Warfarin 10mg  7/24: INR 2.44 - warfarin 10mg  7/25:  INR 3.22 - warfarin 10mg  7/26:  INR 3.08 - warfarin 8mg  7/27:  INR 3.24 - warfarin 8 mg 7/28:  INR 2.79 - 8 mg 7/29:  INR 2.74- 8 mg 7/30:  INR 3.19 - 7 mg 7/31:  INR 2.73  7.5mg  8/1:    INR 3.13  7.5mg  8/2:    INR 2.74  7.5mg  8/3     INR 2.27  7.5mg  8/4     INR 3.19  8 mg 8/5     INR 3.04  7.5 mg 8/6      INR 3.05  7.5mg  8/7:    INR  3.22  6mg  8/8:    INR  3.85  Dose held per protocol 8/9:    INR 2.4     6mg  8/10:  INR 1.93   7mg   8/11:  INR  2.00  7mg  8/12:  INR  2.26  6 mg 8/13:  INR 2.22   7 mg 8/14:  INR 2.18   7mg  8/15:  INR 2.42   NONE given 8/16:  INR 1.77  7.5mg   8/17:  INR 1.43  ? Not given 8/18:  INR Not done  Warfarin 7.5 mg  Goal of Therapy:  INR 2-3 Monitor platelets by anticoagulation protocol: Yes   Plan:  INR is subtherapeutic today as a result of missing does on 8/17. Will give warfarin 7.5 mg today and f/u AM INR. Also order CBC for tomorrow  (Patient with orders for carbamazepine which interacts with warfarin to decrease the INR. Patient has orders for fenofibrate which interacts with warfarin to increase the INR. Will follow INR closely.)   Pt will need  CBC q3 days per policy.  8/18-  No INR today (but it is ordered daily). Will f/u INR in am.  Pharmacy will continue to follow.   Akeria Hedstrom A, [place] 01/19/2016,11:06 PM

## 2016-01-20 LAB — GLUCOSE, CAPILLARY
GLUCOSE-CAPILLARY: 106 mg/dL — AB (ref 65–99)
GLUCOSE-CAPILLARY: 137 mg/dL — AB (ref 65–99)
Glucose-Capillary: 104 mg/dL — ABNORMAL HIGH (ref 65–99)
Glucose-Capillary: 150 mg/dL — ABNORMAL HIGH (ref 65–99)

## 2016-01-20 LAB — PROTIME-INR
INR: 1.84
Prothrombin Time: 21.5 seconds — ABNORMAL HIGH (ref 11.4–15.2)

## 2016-01-20 NOTE — BHH Group Notes (Signed)
[place] LCSW Group Therapy  01/20/2016 3:24 PM  Type of Therapy:  Group Therapy  Participation Level:  Active  Participation Quality:  Appropriate, Attentive and Sharing  Affect:  Appropriate  Cognitive:  Alert  Insight:  Developing/Improving  Engagement in Therapy:  Engaged  Modes of Intervention:  Activity, Discussion, Education and Support  Summary of Progress/Problems:Self esteem: Patients discussed self esteem and how it impacts them. They discussed what aspects in their lives has influenced their self esteem. They were challenged to identify changes that are needed in order to improve self esteem. Patients participated in activity where they had to identify positive adjectives they felt described their personality. Patients shared with the group on the following areas: Things I am good at, What I like about my appearance, I've helped others by, What I value the most, compliments I have received, challenges I have overcome, thing that make me unique, and Times I've made others happy. Pt was able to share openly with the group. Pt stated he was good at working with computers, helping others, and being a father.    Cameron [name] [name], [place] 01/20/2016, 3:27 PM

## 2016-01-20 NOTE — Progress Notes (Signed)
ANTICOAGULATION CONSULT NOTE - Follow Up Consult  Pharmacy Consult for Warfarin Indication: VTE treatment  Allergies  Allergen Reactions  . Asa [Aspirin] Other (See Comments)    Patient tolerates LOW DOSE ASPIRIN.    Patient Measurements: Height: 6' (182.9 cm) Weight: 282 lb (127.9 kg) IBW/kg (Calculated) : 77.6  Vital Signs: Temp: 98.1 F (36.7 C) (08/19 0719) Temp Source: Oral (08/19 0719) BP: 117/73 (08/19 0719) Pulse Rate: 78 (08/19 0719)  Labs:  Recent Labs  01/18/16 0644 01/20/16 0749  HGB 11.4*  --   HCT 34.4*  --   PLT 199  --   LABPROT 17.6* 21.5*  INR 1.43 1.84   Estimated Creatinine Clearance: 75.5 mL/min (by C-G formula based on SCr of 1.6 mg/dL).  Medical History: Past Medical History:  Diagnosis Date  . Bipolar 1 disorder ([place])   . CKD (chronic kidney disease), stage III   . Diabetes mellitus without complication ([place])   . DVT (deep venous thrombosis) ([place])   . Hypercholesteremia   . Hypertension    Medications:  Warfarin   Assessment: 69 yom admitted to ED BHU with manic symptoms. Recently discharged from [facility] with DVT/PE, had been bridging LMWH and VKA prior to admission.  INR History: 7/9: INR - 1.99 7/10: no INR- warfarin 12.5 mg po daily at 0200 7/11: INR - 2.97- warfarin held 7/12: INR - 1.92 - warfarin 10 mg 7/13: INR - 1.95 - warfarin 10 mg 7/14: INR - 2.56 - warfarin 5 mg 7/15: INR - 2.73 - warfarin 5 mg 7/16: INR - 2.39 - warfarin 7.5 mg 7/17: INR - 2.22 - warfarin 7.5 mg 7/18: INR - 2.53 - warfarin 7.5 mg 7/19: INR - 2.75 - warfarin 7.5 mg 7/20: INR - 2.99 - warfarin 6.5 mg 7/21: INR - 2.64 - warfarin 6.5 mg  7/22: INR: 2.44; warfarin 8  7/23: INR: 2.60;  Warfarin 10mg  7/24: INR 2.44 - warfarin 10mg  7/25:  INR 3.22 - warfarin 10mg  7/26:  INR 3.08 - warfarin 8mg  7/27:  INR 3.24 - warfarin 8 mg 7/28:  INR 2.79 - 8 mg 7/29:  INR 2.74- 8 mg 7/30:  INR 3.19 - 7 mg 7/31:  INR 2.73  7.5mg  8/1:    INR 3.13  7.5mg  8/2:    INR  2.74  7.5mg  8/3     INR 2.27  7.5mg  8/4     INR 3.19  8 mg 8/5     INR 3.04  7.5 mg 8/6     INR 3.05  7.5mg  8/7:    INR  3.22  6mg  8/8:    INR  3.85  Dose held per protocol 8/9:    INR 2.4     6mg  8/10:  INR 1.93   7mg   8/11:  INR  2.00  7mg  8/12:  INR  2.26  6 mg 8/13:  INR 2.22   7 mg 8/14:  INR 2.18   7mg  8/15:  INR 2.42   NONE given 8/16:  INR 1.77  7.5mg   8/17:  INR 1.43  ? Not given 8/18:  INR Not done  Warfarin 7.5 mg 8/19: INR: 1.84  Goal of Therapy:  INR 2-3 Monitor platelets by anticoagulation protocol: Yes   Plan:  Will give warfarin 7.5 mg PO x 1 again today.   (Patient with orders for carbamazepine which interacts with warfarin to decrease the INR. Patient has orders for fenofibrate which interacts with warfarin to increase the INR. Will follow INR closely.)  Pt will need CBC q3 days per policy.  Pharmacy will continue to follow.   Jameria Bradway D, RPH 01/20/2016,11:11 AM

## 2016-01-20 NOTE — Progress Notes (Signed)
D:Patient less tangential and hyperverbal. Patient appears sad and depressed. He denies SI/HI/AVH.He has been visible in the milieu interacting with staff and peers. Attended group. CBG 173 at HS. A: Medication was given with education. Encouragement was provided.  R: Patient was compliant with medication. He has remained calm and cooperative. Safety maintained with 15 min checks

## 2016-01-20 NOTE — BHH Group Notes (Signed)
[facility] Group Notes:  (Nursing/MHT/Case Management/Adjunct)  Date:  01/20/2016  Time:  5:16 AM  Type of Therapy:  Psychoeducational Skills  Participation Level:  Active  Participation Quality:  Appropriate and Attentive  Affect:  Appropriate  Cognitive:  Appropriate  Insight:  Appropriate  Engagement in Group:  Engaged  Modes of Intervention:  Discussion, Socialization and Support  Summary of Progress/Problems:  Cameron [name] 01/20/2016, 5:16 AM

## 2016-01-20 NOTE — Progress Notes (Signed)
Pt has been pleasant and cooperative.Pt noted to be more organized and less manicor hyperverbal. Pt has been attending unit activities. Pt continues to be active on the unit. Pt's mood and affect  Appears sad and depressed. Pt denies SI and A/V hallucinations.

## 2016-01-20 NOTE — Progress Notes (Signed)
[facility] MD Progress Note  01/20/2016 11:50 AM Cameron [name]  MRN:  [id number] Subjective: Pt is a 50 year old male seen for follow-up. He remains Hypomanic during the interview.  He refused ECT treatment yesterday. He was talking about his wife and daughter who will be visiting today. He was asking excited to see Dr Thurmond Butts again. Patient remained focused on his daughter and his discharge planning. He reported that he will never take another ECT treatment.  His reasons were irrational. He certainly is better than he was originally but remains very disorganized in his thinking with flight of ideas.   Principal Problem: Bipolar I disorder, current or most recent episode manic, with psychotic features ([place])   Diagnosis:   Patient Active Problem List   Diagnosis Date Noted  . Bipolar affective disorder, current episode manic ([place]) [F31.9] 01/04/2016  . Cellulitis of leg, right [L03.115] 01/01/2016  . Bipolar I disorder, current or most recent episode manic, with psychotic features ([place]) [F31.2] 12/11/2015  . Pulmonary embolism ([place]) [I26.99] 12/04/2015  . Diabetes mellitus without complication ([place]) [id number]   . Hypercholesteremia [E78.00]   . CKD (chronic kidney disease), stage III [N18.3]   . Acute deep vein thrombosis (DVT) of femoral vein of right lower extremity (HCC) [I82.411]    Total Time spent with patient: 20 minutes  Past Psychiatric History: Long history of bipolar disorder multiple manic episodes. History of response to ECT.  Past Medical History:  Past Medical History:  Diagnosis Date  . Bipolar 1 disorder ([place])   . CKD (chronic kidney disease), stage III   . Diabetes mellitus without complication ([place])   . DVT (deep venous thrombosis) ([place])   . Hypercholesteremia   . Hypertension     Past Surgical History:  Procedure Laterality Date  . APPENDECTOMY     Family History:  Family History  Problem Relation Age of Onset  . Stroke Other   . Diabetes Other    Family  Psychiatric  History: Positive for bipolar disorder Social History:  History  Alcohol Use  . Yes    Comment: rarely     History  Drug Use No    Social History   Social History  . Marital status: Married    Spouse name: N/A  . Number of children: N/A  . Years of education: N/A   Social History Main Topics  . Smoking status: Never Smoker  . Smokeless tobacco: Never Used  . Alcohol use Yes     Comment: rarely  . Drug use: No  . Sexual activity: Not Asked   Other Topics Concern  . None   Social History Narrative  . None   Additional Social History:                         Sleep: Fair  Appetite:  Fair  Current Medications: Current Facility-Administered Medications  Medication Dose Route Frequency Provider Last Rate Last Dose  . acetaminophen (TYLENOL) tablet 650 mg  650 mg Oral Q6H PRN Gonzella Lex, MD   650 mg at 01/17/16 0536  . alum & mag hydroxide-simeth (MAALOX/MYLANTA) 200-200-20 MG/5ML suspension 30 mL  30 mL Oral Q4H PRN Gonzella Lex, MD      . atorvastatin (LIPITOR) tablet 40 mg  40 mg Oral q1800 Gonzella Lex, MD   40 mg at 01/19/16 1659  . carbamazepine (TEGRETOL) tablet 1,200 mg  1,200 mg Oral BID PC Gonzella Lex, MD   1,200 mg  at 01/20/16 0920  . dextrose 5 % solution 250 mL  250 mL Intravenous Once Gonzella Lex, MD      . enalapril (VASOTEC) tablet 5 mg  5 mg Oral BID Gonzella Lex, MD   5 mg at 01/20/16 0921  . fenofibrate tablet 160 mg  160 mg Oral Daily Gonzella Lex, MD   160 mg at 01/20/16 0921  . glucagon (human recombinant) (GLUCAGEN) injection 1 mg  1 mg Intramuscular Once PRN Gonzella Lex, MD      . haloperidol lactate (HALDOL) injection 20 mg  20 mg Intramuscular Q6H PRN Gonzella Lex, MD      . insulin glargine (LANTUS) injection 30 Units  30 Units Subcutaneous QHS Gonzella Lex, MD   30 Units at 01/19/16 2216  . lamoTRIgine (LAMICTAL) tablet 200 mg  200 mg Oral Daily Gonzella Lex, MD   200 mg at 01/20/16 0921  .  lithium carbonate capsule 600 mg  600 mg Oral QHS Gonzella Lex, MD   600 mg at 01/19/16 2201  . LORazepam (ATIVAN) injection 2 mg  2 mg Intramuscular Q6H PRN Gonzella Lex, MD      . LORazepam (ATIVAN) tablet 1 mg  1 mg Oral Q4H PRN Rainey Pines, MD   1 mg at 01/17/16 0545  . magnesium hydroxide (MILK OF MAGNESIA) suspension 30 mL  30 mL Oral Daily PRN Gonzella Lex, MD      . midazolam (VERSED) injection 2 mg  2 mg Intravenous Once Gonzella Lex, MD      . QUEtiapine (SEROQUEL) tablet 1,400 mg  1,400 mg Oral QHS Gonzella Lex, MD   1,400 mg at 01/19/16 2201  . temazepam (RESTORIL) capsule 15 mg  15 mg Oral QHS Gonzella Lex, MD   15 mg at 01/19/16 2201  . warfarin (COUMADIN) tablet 7.5 mg  7.5 mg Oral q1800 Gonzella Lex, MD   7.5 mg at 01/19/16 1659  . Warfarin - Pharmacist Dosing Inpatient   Does not apply q1800 Gonzella Lex, MD        Lab Results:  Results for orders placed or performed during the hospital encounter of 01/04/16 (from the past 48 hour(s))  Glucose, capillary     Status: None   Collection Time: 01/18/16  4:37 PM  Result Value Ref Range   Glucose-Capillary 86 65 - 99 mg/dL  Glucose, capillary     Status: Abnormal   Collection Time: 01/18/16  9:37 PM  Result Value Ref Range   Glucose-Capillary 111 (H) 65 - 99 mg/dL  Glucose, capillary     Status: None   Collection Time: 01/19/16  6:37 AM  Result Value Ref Range   Glucose-Capillary 79 65 - 99 mg/dL  Glucose, capillary     Status: Abnormal   Collection Time: 01/19/16 11:41 AM  Result Value Ref Range   Glucose-Capillary 130 (H) 65 - 99 mg/dL   Comment 1 Notify RN   Glucose, capillary     Status: None   Collection Time: 01/19/16  5:01 PM  Result Value Ref Range   Glucose-Capillary 85 65 - 99 mg/dL  Glucose, capillary     Status: None   Collection Time: 01/19/16  8:43 PM  Result Value Ref Range   Glucose-Capillary 84 65 - 99 mg/dL  Glucose, capillary     Status: Abnormal   Collection Time: 01/19/16 10:14 PM   Result Value Ref Range   Glucose-Capillary 173 (  H) 65 - 99 mg/dL  Glucose, capillary     Status: Abnormal   Collection Time: 01/20/16  6:45 AM  Result Value Ref Range   Glucose-Capillary 104 (H) 65 - 99 mg/dL  Protime-INR     Status: Abnormal   Collection Time: 01/20/16  7:49 AM  Result Value Ref Range   Prothrombin Time 21.5 (H) 11.4 - 15.2 seconds   INR 1.84   Glucose, capillary     Status: Abnormal   Collection Time: 01/20/16 11:32 AM  Result Value Ref Range   Glucose-Capillary 150 (H) 65 - 99 mg/dL   Comment 1 Notify RN     Blood Alcohol level:  Lab Results  Component Value Date   ETH <5 [id number]    Metabolic Disorder Labs: Lab Results  Component Value Date   HGBA1C 7.4 (H) 01/02/2016   MPG 192 12/04/2015   MPG 318 (H) 08/24/2010   Lab Results  Component Value Date   PROLACTIN 4.4 12/11/2015   Lab Results  Component Value Date   CHOL 143 12/11/2015   TRIG 286 (H) 12/11/2015   HDL 32 (L) 12/11/2015   CHOLHDL 4.5 12/11/2015   VLDL 57 (H) 12/11/2015   LDLCALC 54 12/11/2015   LDLCALC 56 12/05/2015    Physical Findings: AIMS: Facial and Oral Movements Muscles of Facial Expression: None, normal Lips and Perioral Area: None, normal Jaw: None, normal Tongue: None, normal,Extremity Movements Upper (arms, wrists, hands, fingers): None, normal Lower (legs, knees, ankles, toes): None, normal, Trunk Movements Neck, shoulders, hips: None, normal, Overall Severity Severity of abnormal movements (highest score from questions above): None, normal Incapacitation due to abnormal movements: None, normal Patient's awareness of abnormal movements (rate only patient's report): No Awareness, Dental Status Current problems with teeth and/or dentures?: No Does patient usually wear dentures?: No  CIWA:    COWS:     Musculoskeletal: Strength & Muscle Tone: within normal limits Gait & Station: normal Patient leans: N/A  Psychiatric Specialty Exam: Physical Exam    Nursing note and vitals reviewed. Constitutional: He appears well-developed and well-nourished.  HENT:  Head: Normocephalic and atraumatic.  Eyes: Conjunctivae are normal. Pupils are equal, round, and reactive to light.  Neck: Normal range of motion.  Cardiovascular: Normal heart sounds.   Respiratory: Effort normal. No respiratory distress.  GI: Soft.  Musculoskeletal: Normal range of motion.  Neurological: He is alert.  Skin: Skin is warm and dry.     Psychiatric: His affect is labile and inappropriate. His speech is tangential. He is hyperactive. Thought content is delusional. He expresses impulsivity. He expresses no homicidal and no suicidal ideation. He exhibits abnormal recent memory. He is inattentive.    Review of Systems  Constitutional: Negative.  Negative for chills, fever, malaise/fatigue and weight loss.  HENT: Negative.  Negative for hearing loss, nosebleeds, sore throat and tinnitus.   Eyes: Negative.  Negative for blurred vision and double vision.  Respiratory: Negative.  Negative for cough, hemoptysis, sputum production, shortness of breath and wheezing.   Cardiovascular: Negative.  Negative for chest pain, palpitations and claudication.  Gastrointestinal: Negative.  Negative for abdominal pain, blood in stool, constipation, diarrhea, heartburn, nausea and vomiting.  Genitourinary: Negative for dysuria, frequency and urgency.  Musculoskeletal: Negative.  Negative for back pain, falls, joint pain, myalgias and neck pain.  Skin: Negative.  Negative for itching and rash.  Neurological: Negative.  Negative for dizziness, tingling, tremors, focal weakness, seizures and headaches.  Endo/Heme/Allergies: Negative for environmental allergies. Does not bruise/bleed easily.  Psychiatric/Behavioral: Negative for depression, hallucinations, memory loss, substance abuse and suicidal ideas. The patient is not nervous/anxious and does not have insomnia.     Blood pressure 117/73,  pulse 78, temperature 98.1 F (36.7 C), temperature source Oral, resp. rate 16, height 6' (1.829 m), weight 282 lb (127.9 kg), SpO2 100 %.Body mass index is 38.25 kg/m.  General Appearance: Disheveled  Eye Contact:  Fair  Speech:  Pressured  Volume:  Increased  Mood:  Euphoric  Affect:  Labile  Thought Process:  Disorganized  Orientation:  Full (Time, Place, and Person)  Thought Content:  Illogical, Delusions, Rumination and Tangential  Suicidal Thoughts:  No  Homicidal Thoughts:  No  Memory:  Immediate;   Fair Recent;   Poor Remote;   Fair  Judgement:  Impaired  Insight:  Shallow  Psychomotor Activity:  Increased  Concentration:  Concentration: Poor  Recall:  [place] of Knowledge:  Good  Language:  Good  Akathisia:  No  Handed:  Right  AIMS (if indicated):     Assets:  Financial Resources/Insurance Housing Social Support  ADL's:  Impaired  Cognition:  Impaired,  Mild  Sleep:  Number of Hours: 6     Treatment Plan Summary:  He  did not receive ECT yesterday. We will not be having treatments this upcoming week. I am going to increase his lithium dose to 600 mg at night. He appears to be tolerating the current dose without any difficulty at all. Blood sugars are improved. Vital signs stable. No other change to medicine for today. Again, the patient remains very disorganized with flight of ideas and irrational thinking making it impossible for him to carry on normal behavior outside of a contained setting. I am going to increase his Seroquel tonight to 1400 mg. He is tolerating the high dose without any side effects.   Hyperlipidemia: We'll plan to continue Lipitor 40 mg by mouth daily and fenofibrate 160 mg by mouth daily  Cellulitis of the leg: Will continue Augmentin 1 tablet by mouth twice a day. Cellulitis is slowly improving.  History of pulmonary embolism: We'll continue Coumadin 8 mg by mouth nightly.  Disposition: The patient will be discharged home with his  wife because he does have a stable living situation. Psychotropic medication management follow-up appointment as well as individual therapy will beScheduled with Dr. Thurmond Butts.  Rainey Pines, MD 01/20/2016, 11:50 AM

## 2016-01-21 LAB — GLUCOSE, CAPILLARY
GLUCOSE-CAPILLARY: 112 mg/dL — AB (ref 65–99)
GLUCOSE-CAPILLARY: 140 mg/dL — AB (ref 65–99)
GLUCOSE-CAPILLARY: 99 mg/dL (ref 65–99)
Glucose-Capillary: 86 mg/dL (ref 65–99)

## 2016-01-21 LAB — PROTIME-INR
INR: 1.98
PROTHROMBIN TIME: 22.8 s — AB (ref 11.4–15.2)

## 2016-01-21 NOTE — B[facility] Group Notes (Signed)
[facility] LCSW Group Therapy  01/21/2016 2:36 PM  Type of Therapy:  Group Therapy  Participation Level:  Active  Participation Quality:  Sharing  Affect:  Excited and Not Congruent  Cognitive:  Disorganized  Insight:  Limited  Engagement in Therapy:  Improving and Off Topic  Modes of Intervention:  Discussion, Reality Testing and Support  Summary of Progress/Problems:Coping Skills: Patients defined and discussed healthy coping skills. Patients identified healthy coping skills they would like to try during hospitalization and after discharge. CSW offered insight to varying coping skills that may have been new to patients such as practicing mindfulness. Pt was willing to share with the group but needed redirection to stay on group topic.   Derica Leiber G. [place], [place] 01/21/2016, 2:48 PM

## 2016-01-21 NOTE — Progress Notes (Signed)
Disorganized. Hypomanic. Easily redirected. Denies SI/AVH. Cooperative with treatment and with prescribed medications. Noted to be less intrusive and hyper verbal. Pt often speaks of Dr. Thurmond Butts and is still stating he doesn't want ECT treatment.  Encouragement and support provided. Medications given as prescribed. Safety maintained. Pt voices no additional concerns at this time.

## 2016-01-21 NOTE — [facility] Group Notes (Signed)
[facility] Group Notes:  (Nursing/MHT/Case Management/Adjunct)  Date:  01/21/2016  Time:  4:49 AM  Type of Therapy:  Psychoeducational Skills  Participation Level:  Active  Participation Quality:  Appropriate  Affect:  Appropriate  Cognitive:  Appropriate  Insight:  Appropriate and Good  Engagement in Group:  Engaged  Modes of Intervention:  Discussion, Socialization and Support  Summary of Progress/Problems:  [name] 01/21/2016, 4:49 AM

## 2016-01-21 NOTE — Progress Notes (Signed)
D: Continues to improve. Hypomanic. Less tangential, hyperverbal, and less intrusive. Denies SI/HI/AVH. Engaged in group activity to include singing karaoke.   A: Provided support and encouragement. Medication administered as ordered. Every 15 minute checks for safety.   R: Compliant with medications. Cooperative. Attended and participated in groups. Safety maintained.

## 2016-01-21 NOTE — Progress Notes (Addendum)
ANTICOAGULATION CONSULT NOTE - Follow Up Consult  Pharmacy Consult for Warfarin Indication: VTE treatment  Allergies  Allergen Reactions  . Asa [Aspirin] Other (See Comments)    Patient tolerates LOW DOSE ASPIRIN.    Patient Measurements: Height: 6' (182.9 cm) Weight: 282 lb (127.9 kg) IBW/kg (Calculated) : 77.6  Vital Signs: Temp: 98 F (36.7 C) (08/20 0700) Temp Source: Oral (08/20 0700) BP: 131/68 (08/20 0700) Pulse Rate: 73 (08/20 0700)  Labs:  Recent Labs  01/20/16 0749 01/21/16 0638  LABPROT 21.5* 22.8*  INR 1.84 1.98   Estimated Creatinine Clearance: 75.5 mL/min (by C-G formula based on SCr of 1.6 mg/dL).  Medical History: Past Medical History:  Diagnosis Date  . Bipolar 1 disorder ([place])   . CKD (chronic kidney disease), stage III   . Diabetes mellitus without complication ([place])   . DVT (deep venous thrombosis) ([facility])   . Hypercholesteremia   . Hypertension    Medications:  Warfarin   Assessment: 22 yom admitted to ED [facility] with manic symptoms. Recently discharged from [facility] with D[facility]/PE, had been bridging LMWH and VKA prior to admission.  INR History: 7/9: INR - 1.99 7/10: no INR- warfarin 12.5 mg po daily at 0200 7/11: INR - 2.97- warfarin held 7/12: INR - 1.92 - warfarin 10 mg 7/13: INR - 1.95 - warfarin 10 mg 7/14: INR - 2.56 - warfarin 5 mg 7/15: INR - 2.73 - warfarin 5 mg 7/16: INR - 2.39 - warfarin 7.5 mg 7/17: INR - 2.22 - warfarin 7.5 mg 7/18: INR - 2.53 - warfarin 7.5 mg 7/19: INR - 2.75 - warfarin 7.5 mg 7/20: INR - 2.99 - warfarin 6.5 mg 7/21: INR - 2.64 - warfarin 6.5 mg  7/22: INR: 2.44; warfarin 8  7/23: INR: 2.60;  Warfarin 10mg  7/24: INR 2.44 - warfarin 10mg  7/25:  INR 3.22 - warfarin 10mg  7/26:  INR 3.08 - warfarin 8mg  7/27:  INR 3.24 - warfarin 8 mg 7/28:  INR 2.79 - 8 mg 7/29:  INR 2.74- 8 mg 7/30:  INR 3.19 - 7 mg 7/31:  INR 2.73  7.5mg  8/1:    INR 3.13  7.5mg  8/2:    INR 2.74  7.5mg  8/3     INR 2.27  7.5mg  8/4     INR  3.19  8 mg 8/5     INR 3.04  7.5 mg 8/6     INR 3.05  7.5mg  8/7:    INR  3.22  6mg  8/8:    INR  3.85  Dose held per protocol 8/9:    INR 2.4     6mg  8/10:  INR 1.93   7mg   8/11:  INR  2.00  7mg  8/12:  INR  2.26  6 mg 8/13:  INR 2.22   7 mg 8/14:  INR 2.18   7mg  8/15:  INR 2.42   NONE given 8/16:  INR 1.77  7.5mg   8/17:  INR 1.43  ? Not given 8/18:  INR Not done  Warfarin 7.5 mg 8/19: INR: 1.84; 7.5 mg  8/20: INR: 1.98  Goal of Therapy:  INR 2-3 Monitor platelets by anticoagulation protocol: Yes   Plan:  Will continue warfarin 7.5 mg PO daily.   (Patient with orders for carbamazepine which interacts with warfarin to decrease the INR. Patient has orders for fenofibrate which interacts with warfarin to increase the INR. Will follow INR closely.)   Pt will need CBC q3 days per policy.  Pharmacy will continue to follow.  Cincere Zorn D, RPH 01/21/2016,9:38 AM

## 2016-01-21 NOTE — Progress Notes (Signed)
[facility] MD Progress Note  01/21/2016 11:07 AM Cameron [name]  MRN:  [id number] Subjective: Pt is a 50 year old male seen for follow-up. He remains Hypomanic during the interview.  He stated that know one of the new admit and is very excited and is going to talk to him. He stated that it is a small world. He stated that slept well and is taking his medications.   He continues to be hypomanic and has to redirected multiple times due to irrational thinking.    He certainly is better than he was originally but remains very disorganized in his thinking with flight of ideas.   Principal Problem: Bipolar I disorder, current or most recent episode manic, with psychotic features ([place])   Diagnosis:   Patient Active Problem List   Diagnosis Date Noted  . Bipolar affective disorder, current episode manic ([place]) [F31.9] 01/04/2016  . Cellulitis of leg, right [L03.115] 01/01/2016  . Bipolar I disorder, current or most recent episode manic, with psychotic features ([place]) [F31.2] 12/11/2015  . Pulmonary embolism ([place]) [I26.99] 12/04/2015  . Diabetes mellitus without complication ([place]) [id number]   . Hypercholesteremia [E78.00]   . CKD (chronic kidney disease), stage III [N18.3]   . Acute deep vein thrombosis (DVT) of femoral vein of right lower extremity (HCC) [I82.411]    Total Time spent with patient: 20 minutes  Past Psychiatric History: Long history of bipolar disorder multiple manic episodes. History of response to ECT.  Past Medical History:  Past Medical History:  Diagnosis Date  . Bipolar 1 disorder ([place])   . CKD (chronic kidney disease), stage III   . Diabetes mellitus without complication ([place])   . DVT (deep venous thrombosis) ([place])   . Hypercholesteremia   . Hypertension     Past Surgical History:  Procedure Laterality Date  . APPENDECTOMY     Family History:  Family History  Problem Relation Age of Onset  . Stroke Other   . Diabetes Other    Family Psychiatric  History: Positive  for bipolar disorder Social History:  History  Alcohol Use  . Yes    Comment: rarely     History  Drug Use No    Social History   Social History  . Marital status: Married    Spouse name: N/A  . Number of children: N/A  . Years of education: N/A   Social History Main Topics  . Smoking status: Never Smoker  . Smokeless tobacco: Never Used  . Alcohol use Yes     Comment: rarely  . Drug use: No  . Sexual activity: Not Asked   Other Topics Concern  . None   Social History Narrative  . None   Additional Social History:                         Sleep: Fair  Appetite:  Fair  Current Medications: Current Facility-Administered Medications  Medication Dose Route Frequency Provider Last Rate Last Dose  . acetaminophen (TYLENOL) tablet 650 mg  650 mg Oral Q6H PRN Gonzella Lex, MD   650 mg at 01/17/16 0536  . alum & mag hydroxide-simeth (MAALOX/MYLANTA) 200-200-20 MG/5ML suspension 30 mL  30 mL Oral Q4H PRN Gonzella Lex, MD      . atorvastatin (LIPITOR) tablet 40 mg  40 mg Oral q1800 Gonzella Lex, MD   40 mg at 01/20/16 1650  . carbamazepine (TEGRETOL) tablet 1,200 mg  1,200 mg Oral BID PC John T  Clapacs, MD   1,200 mg at 01/21/16 0914  . dextrose 5 % solution 250 mL  250 mL Intravenous Once Gonzella Lex, MD      . enalapril (VASOTEC) tablet 5 mg  5 mg Oral BID Gonzella Lex, MD   5 mg at 01/21/16 0914  . fenofibrate tablet 160 mg  160 mg Oral Daily Gonzella Lex, MD   160 mg at 01/21/16 0915  . glucagon (human recombinant) (GLUCAGEN) injection 1 mg  1 mg Intramuscular Once PRN Gonzella Lex, MD      . haloperidol lactate (HALDOL) injection 20 mg  20 mg Intramuscular Q6H PRN Gonzella Lex, MD      . insulin glargine (LANTUS) injection 30 Units  30 Units Subcutaneous QHS Gonzella Lex, MD   30 Units at 01/20/16 2146  . lamoTRIgine (LAMICTAL) tablet 200 mg  200 mg Oral Daily Gonzella Lex, MD   200 mg at 01/21/16 0914  . lithium carbonate capsule 600 mg   600 mg Oral QHS Gonzella Lex, MD   600 mg at 01/20/16 2145  . LORazepam (ATIVAN) injection 2 mg  2 mg Intramuscular Q6H PRN Gonzella Lex, MD      . LORazepam (ATIVAN) tablet 1 mg  1 mg Oral Q4H PRN Rainey Pines, MD   1 mg at 01/17/16 0545  . magnesium hydroxide (MILK OF MAGNESIA) suspension 30 mL  30 mL Oral Daily PRN Gonzella Lex, MD      . midazolam (VERSED) injection 2 mg  2 mg Intravenous Once Gonzella Lex, MD      . QUEtiapine (SEROQUEL) tablet 1,400 mg  1,400 mg Oral QHS Gonzella Lex, MD   1,400 mg at 01/20/16 2145  . temazepam (RESTORIL) capsule 15 mg  15 mg Oral QHS Gonzella Lex, MD   15 mg at 01/20/16 2145  . warfarin (COUMADIN) tablet 7.5 mg  7.5 mg Oral q1800 Gonzella Lex, MD   7.5 mg at 01/20/16 1648  . Warfarin - Pharmacist Dosing Inpatient   Does not apply q1800 Gonzella Lex, MD   Stopped at 01/20/16 1653    Lab Results:  Results for orders placed or performed during the hospital encounter of 01/04/16 (from the past 48 hour(s))  Glucose, capillary     Status: Abnormal   Collection Time: 01/19/16 11:41 AM  Result Value Ref Range   Glucose-Capillary 130 (H) 65 - 99 mg/dL   Comment 1 Notify RN   Glucose, capillary     Status: None   Collection Time: 01/19/16  5:01 PM  Result Value Ref Range   Glucose-Capillary 85 65 - 99 mg/dL  Glucose, capillary     Status: None   Collection Time: 01/19/16  8:43 PM  Result Value Ref Range   Glucose-Capillary 84 65 - 99 mg/dL  Glucose, capillary     Status: Abnormal   Collection Time: 01/19/16 10:14 PM  Result Value Ref Range   Glucose-Capillary 173 (H) 65 - 99 mg/dL  Glucose, capillary     Status: Abnormal   Collection Time: 01/20/16  6:45 AM  Result Value Ref Range   Glucose-Capillary 104 (H) 65 - 99 mg/dL  Protime-INR     Status: Abnormal   Collection Time: 01/20/16  7:49 AM  Result Value Ref Range   Prothrombin Time 21.5 (H) 11.4 - 15.2 seconds   INR 1.84   Glucose, capillary     Status: Abnormal   Collection  Time: 01/20/16 11:32 AM  Result Value Ref Range   Glucose-Capillary 150 (H) 65 - 99 mg/dL   Comment 1 Notify RN   Glucose, capillary     Status: Abnormal   Collection Time: 01/20/16  4:44 PM  Result Value Ref Range   Glucose-Capillary 106 (H) 65 - 99 mg/dL   Comment 1 Notify RN   Glucose, capillary     Status: Abnormal   Collection Time: 01/20/16  9:43 PM  Result Value Ref Range   Glucose-Capillary 137 (H) 65 - 99 mg/dL  Protime-INR     Status: Abnormal   Collection Time: 01/21/16  6:38 AM  Result Value Ref Range   Prothrombin Time 22.8 (H) 11.4 - 15.2 seconds   INR 1.98   Glucose, capillary     Status: Abnormal   Collection Time: 01/21/16  6:39 AM  Result Value Ref Range   Glucose-Capillary 112 (H) 65 - 99 mg/dL    Blood Alcohol level:  Lab Results  Component Value Date   ETH <5 [id number]    Metabolic Disorder Labs: Lab Results  Component Value Date   HGBA1C 7.4 (H) 01/02/2016   MPG 192 12/04/2015   MPG 318 (H) 08/24/2010   Lab Results  Component Value Date   PROLACTIN 4.4 12/11/2015   Lab Results  Component Value Date   CHOL 143 12/11/2015   TRIG 286 (H) 12/11/2015   HDL 32 (L) 12/11/2015   CHOLHDL 4.5 12/11/2015   VLDL 57 (H) 12/11/2015   LDLCALC 54 12/11/2015   LDLCALC 56 12/05/2015    Physical Findings: AIMS: Facial and Oral Movements Muscles of Facial Expression: None, normal Lips and Perioral Area: None, normal Jaw: None, normal Tongue: None, normal,Extremity Movements Upper (arms, wrists, hands, fingers): None, normal Lower (legs, knees, ankles, toes): None, normal, Trunk Movements Neck, shoulders, hips: None, normal, Overall Severity Severity of abnormal movements (highest score from questions above): None, normal Incapacitation due to abnormal movements: None, normal Patient's awareness of abnormal movements (rate only patient's report): No Awareness, Dental Status Current problems with teeth and/or dentures?: No Does patient usually wear  dentures?: No  CIWA:    COWS:     Musculoskeletal: Strength & Muscle Tone: within normal limits Gait & Station: normal Patient leans: N/A  Psychiatric Specialty Exam: Physical Exam  Nursing note and vitals reviewed. Constitutional: He appears well-developed and well-nourished.  HENT:  Head: Normocephalic and atraumatic.  Eyes: Conjunctivae are normal. Pupils are equal, round, and reactive to light.  Neck: Normal range of motion.  Cardiovascular: Normal heart sounds.   Respiratory: Effort normal. No respiratory distress.  GI: Soft.  Musculoskeletal: Normal range of motion.  Neurological: He is alert.  Skin: Skin is warm and dry.     Psychiatric: His affect is labile and inappropriate. His speech is tangential. He is hyperactive. Thought content is delusional. He expresses impulsivity. He expresses no homicidal and no suicidal ideation. He exhibits abnormal recent memory. He is inattentive.    Review of Systems  Constitutional: Negative.  Negative for chills, fever, malaise/fatigue and weight loss.  HENT: Negative.  Negative for hearing loss, nosebleeds, sore throat and tinnitus.   Eyes: Negative.  Negative for blurred vision and double vision.  Respiratory: Negative.  Negative for cough, hemoptysis, sputum production, shortness of breath and wheezing.   Cardiovascular: Negative.  Negative for chest pain, palpitations and claudication.  Gastrointestinal: Negative.  Negative for abdominal pain, blood in stool, constipation, diarrhea, heartburn, nausea and vomiting.  Genitourinary: Negative for dysuria,  frequency and urgency.  Musculoskeletal: Negative.  Negative for back pain, falls, joint pain, myalgias and neck pain.  Skin: Negative.  Negative for itching and rash.  Neurological: Negative.  Negative for dizziness, tingling, tremors, focal weakness, seizures and headaches.  Endo/Heme/Allergies: Negative for environmental allergies. Does not bruise/bleed easily.    Psychiatric/Behavioral: Negative for depression, hallucinations, memory loss, substance abuse and suicidal ideas. The patient is not nervous/anxious and does not have insomnia.     Blood pressure 131/68, pulse 73, temperature 98 F (36.7 C), temperature source Oral, resp. rate 18, height 6' (1.829 m), weight 282 lb (127.9 kg), SpO2 100 %.Body mass index is 38.25 kg/m.  General Appearance: Disheveled  Eye Contact:  Fair  Speech:  Pressured  Volume:  Increased  Mood:  Euphoric  Affect:  Labile  Thought Process:  Disorganized  Orientation:  Full (Time, Place, and Person)  Thought Content:  Illogical, Delusions, Rumination and Tangential  Suicidal Thoughts:  No  Homicidal Thoughts:  No  Memory:  Immediate;   Fair Recent;   Poor Remote;   Fair  Judgement:  Impaired  Insight:  Shallow  Psychomotor Activity:  Increased  Concentration:  Concentration: Poor  Recall:  [place] Knowledge:  Good  Language:  Good  Akathisia:  No  Handed:  Right  AIMS (if indicated):     Assets:  Financial Resources/Insurance Housing Social Support  ADL's:  Impaired  Cognition:  Impaired,  Mild  Sleep:  Number of Hours: 6     Treatment Plan Summary:  He  did not receive ECT past week. We will not be having treatments this upcoming week. I am going to increase his lithium dose to 600 mg at night. He appears to be tolerating the current dose without any difficulty at all. Blood sugars are improved. Vital signs stable. No other change to medicine for today. Again, the patient remains very disorganized with flight of ideas and irrational thinking making it impossible for him to carry on normal behavior outside of a contained setting. I am going to increase his Seroquel tonight to 1400 mg. He is tolerating the high dose without any side effects.   Hyperlipidemia: We'll plan to continue Lipitor 40 mg by mouth daily and fenofibrate 160 mg by mouth daily  Cellulitis of the leg: Will continue Augmentin  1 tablet by mouth twice a day. Cellulitis is slowly improving.  History of pulmonary embolism: We'll continue Coumadin 8 mg by mouth nightly.  Disposition: The patient will be discharged home with his wife because he does have a stable living situation. Psychotropic medication management follow-up appointment as well as individual therapy will beScheduled with Dr. Thurmond Butts.  Rainey Pines, MD 01/21/2016, 11:07 AM

## 2016-01-21 NOTE — Plan of Care (Signed)
Problem: Role Relationship: Goal: Ability to interact with others will improve Outcome: Progressing Less intrusive. Continues to improve, but remains hypomanic. Very little redirection needed today. Will continue to monitor.

## 2016-01-22 LAB — GLUCOSE, CAPILLARY
GLUCOSE-CAPILLARY: 94 mg/dL (ref 65–99)
GLUCOSE-CAPILLARY: 135 mg/dL — AB (ref 65–99)
Glucose-Capillary: 135 mg/dL — ABNORMAL HIGH (ref 65–99)
Glucose-Capillary: 156 mg/dL — ABNORMAL HIGH (ref 65–99)

## 2016-01-22 LAB — PROTIME-INR
INR: 2.09
PROTHROMBIN TIME: 23.8 s — AB (ref 11.4–15.2)

## 2016-01-22 MED ORDER — WARFARIN SODIUM 4 MG PO TABS
8.0000 mg | ORAL_TABLET | Freq: Every day | ORAL | Status: DC
  Administered 2016-01-22 – 2016-01-23 (×2): 8 mg via ORAL
  Filled 2016-01-22 (×2): qty 2

## 2016-01-22 NOTE — Progress Notes (Signed)
ANTICOAGULATION CONSULT NOTE - Follow Up Consult  Pharmacy Consult for Warfarin Indication: VTE treatment  Allergies  Allergen Reactions  . Asa [Aspirin] Other (See Comments)    Patient tolerates LOW DOSE ASPIRIN.    Patient Measurements: Height: 6' (182.9 cm) Weight: 282 lb (127.9 kg) IBW/kg (Calculated) : 77.6  Vital Signs: Temp: 99 F (37.2 C) (08/21 0702) Temp Source: Oral (08/21 0702) BP: 99/53 (08/21 0702) Pulse Rate: 65 (08/21 0702)  Labs:  Recent Labs  01/20/16 0749 01/21/16 0638 01/22/16 0653  LABPROT 21.5* 22.8* 23.8*  INR 1.84 1.98 2.09   Estimated Creatinine Clearance: 75.5 mL/min (by C-G formula based on SCr of 1.6 mg/dL).  Medical History: Past Medical History:  Diagnosis Date  . Bipolar 1 disorder ([place])   . CKD (chronic kidney disease), stage III   . Diabetes mellitus without complication ([place])   . DVT (deep venous thrombosis) ([place])   . Hypercholesteremia   . Hypertension    Medications:  Warfarin   Assessment: 44 yom admitted to ED B[facility] with manic symptoms. Recently discharged from [facility] with DVT/PE, had been bridging LMWH and VKA prior to admission.  INR History: 7/9: INR - 1.99 7/10: no INR- warfarin 12.5 mg po daily at 0200 7/11: INR - 2.97- warfarin held 7/12: INR - 1.92 - warfarin 10 mg 7/13: INR - 1.95 - warfarin 10 mg 7/14: INR - 2.56 - warfarin 5 mg 7/15: INR - 2.73 - warfarin 5 mg 7/16: INR - 2.39 - warfarin 7.5 mg 7/17: INR - 2.22 - warfarin 7.5 mg 7/18: INR - 2.53 - warfarin 7.5 mg 7/19: INR - 2.75 - warfarin 7.5 mg 7/20: INR - 2.99 - warfarin 6.5 mg 7/21: INR - 2.64 - warfarin 6.5 mg  7/22: INR: 2.44; warfarin 8  7/23: INR: 2.60;  Warfarin 10mg  7/24: INR 2.44 - warfarin 10mg  7/25:  INR 3.22 - warfarin 10mg  7/26:  INR 3.08 - warfarin 8mg  7/27:  INR 3.24 - warfarin 8 mg 7/28:  INR 2.79 - 8 mg 7/29:  INR 2.74- 8 mg 7/30:  INR 3.19 - 7 mg 7/31:  INR 2.73  7.5mg  8/1:    INR 3.13  7.5mg  8/2:    INR 2.74  7.5mg  8/3     INR  2.27  7.5mg  8/4     INR 3.19  8 mg 8/5     INR 3.04  7.5 mg 8/6     INR 3.05  7.5mg  8/7:    INR  3.22  6mg  8/8:    INR  3.85  Dose held per protocol 8/9:    INR 2.4     6mg  8/10:  INR 1.93   7mg   8/11:  INR  2.00  7mg  8/12:  INR  2.26  6 mg 8/13:  INR 2.22   7 mg 8/14:  INR 2.18   7mg  8/15:  INR 2.42   NONE given 8/16:  INR 1.77  7.5mg   8/17:  INR 1.43  ? Not given 8/18:  INR Not done  Warfarin 7.5 mg 8/19: INR: 1.84; 7.5 mg  8/20: INR: 1.98  7.5mg  8/21:  INR 2.09  Goal of Therapy:  INR 2-3 Monitor platelets by anticoagulation protocol: Yes   Plan:  Will increase warfarin to 8 mg PO daily.   (Patient with orders for carbamazepine which interacts with warfarin to decrease the INR. Patient has orders for fenofibrate which interacts with warfarin to increase the INR. Will follow INR closely.)   Pt will need  CBC q3 days per policy.  Pharmacy will continue to follow.   Arielle Eber K, RPH 01/22/2016,1:49 PM

## 2016-01-22 NOTE — Plan of Care (Signed)
Problem: Coping: Goal: Ability to demonstrate self-control will improve Outcome: Progressing Patient was upset this morning when he was asked not to eat his breakfast just yet until we clarified the ECT. He was upset but then cooperated with the request until everything was clarified.

## 2016-01-22 NOTE — Progress Notes (Signed)
D: Pt denies SI/HI/AVH, affect is blunted, thoughts process are circumstantial with some flight of ideas noted, his thought content however was not delusional.   Pt is pleasant and cooperative with treatment plan. Pt stated he feels better from doing ECT, he appears less anxious and he is interacting with peers and staff appropriately.  A: Pt was offered support and encouragement. Pt was given scheduled medications. Pt was encouraged to attend groups. Q 15 minute checks were done for safety.  R:Pt attends groups and interacts well with peers and staff. Pt is taking medication. Pt has no complaints.Pt receptive to treatment and safety maintained on unit.

## 2016-01-22 NOTE — Plan of Care (Signed)
Problem: Pain Managment: Goal: General experience of comfort will improve Outcome: Progressing Patient denies pain at this time   

## 2016-01-22 NOTE — Progress Notes (Signed)
Recreation Therapy Notes  Date: 08.21.17 Time: 9:30 am Location: Craft Room  Group Topic: Self-expression  Goal Area(s) Addresses:  Patient will identify one color per emotion listed on wheel. Patient will verbalize benefit of using art as a means of self-expression. Patient will verbalize one emotion experienced during session. Patient will be educated on other forms of self-expression.  Behavioral Response: Intermittently Attentive, Off Topic, Left early  Intervention: Emotion Wheel  Activity: Patients were given an Emotion Wheel worksheet and instructed to pick a color for each emotion listed on the wheel.  Education: LRT educated patients on other forms of self-expression.  Education Outcome: Patient left before LRT educated group.  Clinical Observations/Feedback: Patient drew his own worksheet. Patient attempted to contributed to group discussion but patient was off topic. Patient left group at approximately 10:10 am. Patient did not return to group.  [name], LRT/CTRS 01/22/2016 10:33 AM

## 2016-01-22 NOTE — Progress Notes (Signed)
[facility] MD Progress Note  01/22/2016 3:25 PM Cameron [name]  MRN:  [id number] Subjective:  There is no significant change in his status. Cameron [name] continues to be hypomanic, disorganized, intrussive and difficult to redirect. He had a fight with his mother on the phone today and has been asking me nonsensical questions. He accepts and tolerates medications well. He is out in the community and tries to participate in programming but has been disruptive in group.    Principal Problem: Bipolar I disorder, current or most recent episode manic, with psychotic features ([place])   Diagnosis:   Patient Active Problem List   Diagnosis Date Noted  . Bipolar affective disorder, current episode manic ([place]) [F31.9] 01/04/2016  . Cellulitis of leg, right [L03.115] 01/01/2016  . Bipolar I disorder, current or most recent episode manic, with psychotic features ([place]) [F31.2] 12/11/2015  . Pulmonary embolism ([place]) [I26.99] 12/04/2015  . Diabetes mellitus without complication ([place]) [id number]   . Hypercholesteremia [E78.00]   . CKD (chronic kidney disease), stage III [N18.3]   . Acute deep vein thrombosis (DVT) of femoral vein of right lower extremity (HCC) [I82.411]    Total Time spent with patient: 20 minutes  Past Psychiatric History: Long history of bipolar disorder multiple manic episodes. History of response to ECT.  Past Medical History:  Past Medical History:  Diagnosis Date  . Bipolar 1 disorder ([place])   . CKD (chronic kidney disease), stage III   . Diabetes mellitus without complication ([place])   . DVT (deep venous thrombosis) ([place])   . Hypercholesteremia   . Hypertension     Past Surgical History:  Procedure Laterality Date  . APPENDECTOMY     Family History:  Family History  Problem Relation Age of Onset  . Stroke Other   . Diabetes Other    Family Psychiatric  History: Positive for bipolar disorder Social History:  History  Alcohol Use  . Yes    Comment: rarely     History  Drug  Use No    Social History   Social History  . Marital status: Married    Spouse name: N/A  . Number of children: N/A  . Years of education: N/A   Social History Main Topics  . Smoking status: Never Smoker  . Smokeless tobacco: Never Used  . Alcohol use Yes     Comment: rarely  . Drug use: No  . Sexual activity: Not Asked   Other Topics Concern  . None   Social History Narrative  . None   Additional Social History:                         Sleep: Fair  Appetite:  Fair  Current Medications: Current Facility-Administered Medications  Medication Dose Route Frequency Provider Last Rate Last Dose  . acetaminophen (TYLENOL) tablet 650 mg  650 mg Oral Q6H PRN Gonzella Lex, MD   650 mg at 01/17/16 0536  . alum & mag hydroxide-simeth (MAALOX/MYLANTA) 200-200-20 MG/5ML suspension 30 mL  30 mL Oral Q4H PRN Gonzella Lex, MD      . atorvastatin (LIPITOR) tablet 40 mg  40 mg Oral q1800 Gonzella Lex, MD   40 mg at 01/21/16 1641  . carbamazepine (TEGRETOL) tablet 1,200 mg  1,200 mg Oral BID PC Gonzella Lex, MD   1,200 mg at 01/22/16 0926  . dextrose 5 % solution 250 mL  250 mL Intravenous Once Gonzella Lex, MD      .  enalapril (VASOTEC) tablet 5 mg  5 mg Oral BID Gonzella Lex, MD   Stopped at 01/22/16 [phone]  . fenofibrate tablet 160 mg  160 mg Oral Daily Gonzella Lex, MD   160 mg at 01/22/16 0926  . glucagon (human recombinant) (GLUCAGEN) injection 1 mg  1 mg Intramuscular Once PRN Gonzella Lex, MD      . haloperidol lactate (HALDOL) injection 20 mg  20 mg Intramuscular Q6H PRN Gonzella Lex, MD      . insulin glargine (LANTUS) injection 30 Units  30 Units Subcutaneous QHS Gonzella Lex, MD   30 Units at 01/21/16 2247  . lamoTRIgine (LAMICTAL) tablet 200 mg  200 mg Oral Daily Gonzella Lex, MD   200 mg at 01/22/16 0926  . lithium carbonate capsule 600 mg  600 mg Oral QHS Gonzella Lex, MD   600 mg at 01/21/16 2247  . LORazepam (ATIVAN) injection 2 mg  2 mg  Intramuscular Q6H PRN Gonzella Lex, MD      . LORazepam (ATIVAN) tablet 1 mg  1 mg Oral Q4H PRN Rainey [place], MD   1 mg at 01/17/16 0545  . magnesium hydroxide (MILK OF MAGNESIA) suspension 30 mL  30 mL Oral Daily PRN Gonzella Lex, MD      . midazolam (VERSED) injection 2 mg  2 mg Intravenous Once Gonzella Lex, MD      . QUEtiapine (SEROQUEL) tablet 1,400 mg  1,400 mg Oral QHS Gonzella Lex, MD   1,400 mg at 01/21/16 2244  . temazepam (RESTORIL) capsule 15 mg  15 mg Oral QHS Gonzella Lex, MD   15 mg at 01/21/16 2247  . warfarin (COUMADIN) tablet 8 mg  8 mg Oral q1800 Gonzella Lex, MD      . Warfarin - Pharmacist Dosing Inpatient   Does not apply q1800 Gonzella Lex, MD   Stopped at 01/20/16 1653    Lab Results:  Results for orders placed or performed during the hospital encounter of 01/04/16 (from the past 48 hour(s))  Glucose, capillary     Status: Abnormal   Collection Time: 01/20/16  4:44 PM  Result Value Ref Range   Glucose-Capillary 106 (H) 65 - 99 mg/dL   Comment 1 Notify RN   Glucose, capillary     Status: Abnormal   Collection Time: 01/20/16  9:43 PM  Result Value Ref Range   Glucose-Capillary 137 (H) 65 - 99 mg/dL  Protime-INR     Status: Abnormal   Collection Time: 01/21/16  6:38 AM  Result Value Ref Range   Prothrombin Time 22.8 (H) 11.4 - 15.2 seconds   INR 1.98   Glucose, capillary     Status: Abnormal   Collection Time: 01/21/16  6:39 AM  Result Value Ref Range   Glucose-Capillary 112 (H) 65 - 99 mg/dL  Glucose, capillary     Status: Abnormal   Collection Time: 01/21/16 11:32 AM  Result Value Ref Range   Glucose-Capillary 140 (H) 65 - 99 mg/dL  Glucose, capillary     Status: None   Collection Time: 01/21/16  4:40 PM  Result Value Ref Range   Glucose-Capillary 86 65 - 99 mg/dL  Glucose, capillary     Status: None   Collection Time: 01/21/16  9:32 PM  Result Value Ref Range   Glucose-Capillary 99 65 - 99 mg/dL  Protime-INR     Status: Abnormal    Collection Time: 01/22/16  6:53  AM  Result Value Ref Range   Prothrombin Time 23.8 (H) 11.4 - 15.2 seconds   INR 2.09   Glucose, capillary     Status: None   Collection Time: 01/22/16  7:12 AM  Result Value Ref Range   Glucose-Capillary 94 65 - 99 mg/dL  Glucose, capillary     Status: Abnormal   Collection Time: 01/22/16 11:35 AM  Result Value Ref Range   Glucose-Capillary 135 (H) 65 - 99 mg/dL    Blood Alcohol level:  Lab Results  Component Value Date   ETH <5 [id number]    Metabolic Disorder Labs: Lab Results  Component Value Date   HGBA1C 7.4 (H) 01/02/2016   MPG 192 12/04/2015   MPG 318 (H) 08/24/2010   Lab Results  Component Value Date   PROLACTIN 4.4 12/11/2015   Lab Results  Component Value Date   CHOL 143 12/11/2015   TRIG 286 (H) 12/11/2015   HDL 32 (L) 12/11/2015   CHOLHDL 4.5 12/11/2015   VLDL 57 (H) 12/11/2015   LDLCALC 54 12/11/2015   LDLCALC 56 12/05/2015    Physical Findings: AIMS: Facial and Oral Movements Muscles of Facial Expression: None, normal Lips and Perioral Area: None, normal Jaw: None, normal Tongue: None, normal,Extremity Movements Upper (arms, wrists, hands, fingers): None, normal Lower (legs, knees, ankles, toes): None, normal, Trunk Movements Neck, shoulders, hips: None, normal, Overall Severity Severity of abnormal movements (highest score from questions above): None, normal Incapacitation due to abnormal movements: None, normal Patient's awareness of abnormal movements (rate only patient's report): No Awareness, Dental Status Current problems with teeth and/or dentures?: No Does patient usually wear dentures?: No  CIWA:    COWS:     Musculoskeletal: Strength & Muscle Tone: within normal limits Gait & Station: normal Patient leans: N/A  Psychiatric Specialty Exam: Physical Exam  Nursing note and vitals reviewed. Respiratory: No respiratory distress.  Skin:     Psychiatric: His affect is labile and inappropriate. His  speech is tangential. He is hyperactive. Thought content is delusional. He expresses impulsivity. He expresses no homicidal and no suicidal ideation. He exhibits abnormal recent memory. He is inattentive.    Review of Systems  Constitutional: Negative.  Negative for chills, fever, malaise/fatigue and weight loss.  HENT: Negative.  Negative for hearing loss, nosebleeds, sore throat and tinnitus.   Eyes: Negative.  Negative for blurred vision and double vision.  Respiratory: Negative.  Negative for cough, hemoptysis, sputum production, shortness of breath and wheezing.   Cardiovascular: Negative.  Negative for chest pain, palpitations and claudication.  Gastrointestinal: Negative.  Negative for abdominal pain, blood in stool, constipation, diarrhea, heartburn, nausea and vomiting.  Genitourinary: Negative for dysuria, frequency and urgency.  Musculoskeletal: Negative.  Negative for back pain, falls, joint pain, myalgias and neck pain.  Skin: Negative.  Negative for itching and rash.  Neurological: Negative.  Negative for dizziness, tingling, tremors, focal weakness, seizures and headaches.  Endo/Heme/Allergies: Negative for environmental allergies. Does not bruise/bleed easily.  Psychiatric/Behavioral: Negative for depression, hallucinations, memory loss, substance abuse and suicidal ideas. The patient is not nervous/anxious and does not have insomnia.     Blood pressure (!) 99/53, pulse 65, temperature 99 F (37.2 C), temperature source Oral, resp. rate 18, height 6' (1.829 m), weight 127.9 kg (282 lb), SpO2 100 %.Body mass index is 38.25 kg/m.  General Appearance: Disheveled  Eye Contact:  Fair  Speech:  Pressured  Volume:  Increased  Mood:  Euphoric  Affect:  Labile  Thought Process:  Disorganized  Orientation:  Full (Time, Place, and Person)  Thought Content:  Illogical, Delusions, Rumination and Tangential  Suicidal Thoughts:  No  Homicidal Thoughts:  No  Memory:  Immediate;    Fair Recent;   Poor Remote;   Fair  Judgement:  Impaired  Insight:  Shallow  Psychomotor Activity:  Increased  Concentration:  Concentration: Poor  Recall:  [place] of Knowledge:  Good  Language:  Good  Akathisia:  No  Handed:  Right  AIMS (if indicated):     Assets:  Financial Resources/Insurance Housing Social Support  ADL's:  Impaired  Cognition:  Impaired,  Mild  Sleep:  Number of Hours: 5.75     Treatment Plan Summary:  He  did not receive ECT past week. We will not be having treatments this upcoming week. I am going to increase his lithium dose to 600 mg at night. He appears to be tolerating the current dose without any difficulty at all. Blood sugars are improved. Vital signs stable. No other change to medicine for today. Again, the patient remains very disorganized with flight of ideas and irrational thinking making it impossible for him to carry on normal behavior outside of a contained setting. I am going to increase his Seroquel tonight to 1400 mg. He is tolerating the high dose without any side effects.   Hyperlipidemia: We'll plan to continue Lipitor 40 mg by mouth daily and fenofibrate 160 mg by mouth daily  Cellulitis of the leg: Will continue Augmentin 1 tablet by mouth twice a day. Cellulitis is slowly improving.  History of pulmonary embolism: We'll continue Coumadin 8 mg by mouth nightly.  Disposition: The patient will be discharged home with his wife because he does have a stable living situation. Psychotropic medication management follow-up appointment as well as individual therapy will beScheduled with Dr. Thurmond Butts.  01/22/2016 No medication changes.  Orson Slick, MD 01/22/2016, 3:25 PM

## 2016-01-22 NOTE — [facility] Group Notes (Signed)
[facility] LCSW Group Therapy   01/22/2016 1pm Type of Therapy: Group Therapy   Participation Level: Active   Participation Quality: Attentive, Sharing and Supportive   Affect: Depressed and Flat   Cognitive: Alert and Oriented   Insight: Developing/Improving and Engaged   Engagement in Therapy: Developing/Improving and Engaged   Modes of Intervention: Clarification, Confrontation, Discussion, Education, Exploration,  Limit-setting, Orientation, Problem-solving, Rapport Building, Art therapist, Socialization and Support   Summary of Progress/Problems: Pt identified obstacles faced currently and processed barriers involved in overcoming these obstacles. Pt identified steps necessary for overcoming these obstacles and explored motivation (internal and external) for facing these difficulties head on. Pt further identified one area of concern in their lives and chose a goal to focus on for today. Pt tended to ramble when asked to share, but the pt's speech was more coherent and the pt's thoughts were more cohesive and coherent than in the past.  Pt shared to overcome obstacles he writes computer programs to track his progress.  Pt seems to have made great improvement during group, as evidenced by increased sharing, sharing at length when prompted and pt presents as happy and increasingly energetic, as compared to previous sessions.     [name]. Juhi Lagrange, LCSWA, LCAS

## 2016-01-22 NOTE — Progress Notes (Signed)
Patient got loud while on the phone with mother this morning. He was talked to and redirected. He also was upset when he was asked not to eat his breakfast until we clarified ECT. He was however cooperative till everything was clarified. Patient attended groups and interacted with peers even though somewhat intrusive. Medication compliant. Denies SI/AVH and contracts for safety.

## 2016-01-22 NOTE — Progress Notes (Signed)
D: Patient has been flat and depressed. States he's having issues now with his wife because she said that he won't be coming home anytime soon. Denies SI/HI/AVH. Still tangential and disorganized. Visible in the milieu. Interacts with peers and went to group.  A: Medication given with education. Encouragement provided.  R: Patient was compliant with medication. He has remained calm and cooperative. Safety maintained with 15 min checks.

## 2016-01-22 NOTE — [facility] Group Notes (Signed)
[place] Group Notes:  (Nursing/MHT/Case Management/Adjunct)  Date:  01/22/2016  Time:  11:10 PM  Type of Therapy:  Evening Wrap-up Group  Participation Level:  Active  Participation Quality:  Appropriate and Attentive  Affect:  Appropriate  Cognitive:  Alert and Appropriate  Insight:  Appropriate, Good and Improving  Engagement in Group:  Engaged, Improving and Supportive  Modes of Intervention:  Activity and Discussion  Summary of Progress/Problems:  Levonne Spiller 01/22/2016, 11:10 PM

## 2016-01-23 LAB — GLUCOSE, CAPILLARY
GLUCOSE-CAPILLARY: 102 mg/dL — AB (ref 65–99)
GLUCOSE-CAPILLARY: 134 mg/dL — AB (ref 65–99)
GLUCOSE-CAPILLARY: 88 mg/dL (ref 65–99)
Glucose-Capillary: 99 mg/dL (ref 65–99)

## 2016-01-23 LAB — CBC
HCT: 36.4 % — ABNORMAL LOW (ref 40.0–52.0)
Hemoglobin: 12.3 g/dL — ABNORMAL LOW (ref 13.0–18.0)
MCH: 29 pg (ref 26.0–34.0)
MCHC: 33.8 g/dL (ref 32.0–36.0)
MCV: 85.9 fL (ref 80.0–100.0)
PLATELETS: 199 10*3/uL (ref 150–440)
RBC: 4.24 MIL/uL — AB (ref 4.40–5.90)
RDW: 13.4 % (ref 11.5–14.5)
WBC: 2.4 10*3/uL — AB (ref 3.8–10.6)

## 2016-01-23 LAB — PROTIME-INR
INR: 2.27
PROTHROMBIN TIME: 25.4 s — AB (ref 11.4–15.2)

## 2016-01-23 MED ORDER — LITHIUM CARBONATE 300 MG PO CAPS
900.0000 mg | ORAL_CAPSULE | Freq: Every day | ORAL | Status: DC
  Administered 2016-01-23 – 2016-01-31 (×9): 900 mg via ORAL
  Filled 2016-01-23 (×9): qty 3

## 2016-01-23 NOTE — [facility] Group Notes (Signed)
Goals Group Date/Time: 01/23/2016 9:00 AM Type of Therapy and Topic: Group Therapy: Goals Group: SMART Goals   Participation Level: Moderate  Description of Group:    The purpose of a daily goals group is to assist and guide patients in setting recovery/wellness-related goals. The objective is to set goals as they relate to the crisis in which they were admitted. Patients will be using SMART goal modalities to set measurable goals. Characteristics of realistic goals will be discussed and patients will be assisted in setting and processing how one will reach their goal. Facilitator will also assist patients in applying interventions and coping skills learned in psycho-education groups to the SMART goal and process how one will achieve defined goal.   Therapeutic Goals:   -Patients will develop and document one goal related to or their crisis in which brought them into treatment.  -Patients will be guided by LCSW using SMART goal setting modality in how to set a measurable, attainable, realistic and time sensitive goal.  -Patients will process barriers in reaching goal.  -Patients will process interventions in how to overcome and successful in reaching goal.   Patient's Goal: Patient stated that his goal is to discharge home with family. In order to do this, pt will meet with his treatment team and his family to discuss aftercare plans.   Therapeutic Modalities:  Motivational Interviewing  Public relations account executive Therapy  Crisis Intervention Model  SMART goals setting   Emilie Rutter, LCSWA

## 2016-01-23 NOTE — Plan of Care (Signed)
Problem: Education: Goal: Will be free of psychotic symptoms Outcome: Progressing Continues to be less intrusive. Pt states he is ready for discharge, but has not been released by the doctor. Upset with wife/family because they will not come get him or discuss discharge. Tearful when reporting to nurse. Support and encouragement provided. When nurse informed him he would have to have discharge order from doctor to leave, he asked to talk more about it later. Will continue to monitor.

## 2016-01-23 NOTE — Progress Notes (Signed)
[facility] MD Progress Note  01/23/2016 8:53 PM Cameron [name]  MRN:  [id number] Subjective:  Follow-up for Tuesday the 22nd. Patient continues to be hypomanic but I see gradual improvement. He is able to actually engage in a conversation that has meaning to it although he does get tangential easily and requires some redirection. He is able to stop talking and allow others to speak. His statements do not contain anything that is bizarre. He is still focused on wanting to get out of the hospital. Denies any side effects from medicine  Principal Problem: Bipolar I disorder, current or most recent episode manic, with psychotic features [facility])   Diagnosis:   Patient Active Problem List   Diagnosis Date Noted  . Bipolar affective disorder, current episode manic ([place]) [F31.9] 01/04/2016  . Cellulitis of leg, right [L03.115] 01/01/2016  . Bipolar I disorder, current or most recent episode manic, with psychotic features ([place]) [F31.2] 12/11/2015  . Pulmonary embolism ([place]) [I26.99] 12/04/2015  . Diabetes mellitus without complication ([place]) [id number]   . Hypercholesteremia [E78.00]   . CKD (chronic kidney disease), stage III [N18.3]   . Acute deep vein thrombosis (DVT) of femoral vein of right lower extremity (HCC) [I82.411]    Total Time spent with patient: 20 minutes  Past Psychiatric History: Long history of bipolar disorder multiple manic episodes. History of response to ECT.  Past Medical History:  Past Medical History:  Diagnosis Date  . Bipolar 1 disorder ([place])   . CKD (chronic kidney disease), stage III   . Diabetes mellitus without complication ([place])   . DVT (deep venous thrombosis) ([place])   . Hypercholesteremia   . Hypertension     Past Surgical History:  Procedure Laterality Date  . APPENDECTOMY     Family History:  Family History  Problem Relation Age of Onset  . Stroke Other   . Diabetes Other    Family Psychiatric  History: Positive for bipolar disorder Social History:    History  Alcohol Use  . Yes    Comment: rarely     History  Drug Use No    Social History   Social History  . Marital status: Married    Spouse name: N/A  . Number of children: N/A  . Years of education: N/A   Social History Main Topics  . Smoking status: Never Smoker  . Smokeless tobacco: Never Used  . Alcohol use Yes     Comment: rarely  . Drug use: No  . Sexual activity: Not Asked   Other Topics Concern  . None   Social History Narrative  . None   Additional Social History:                         Sleep: Fair  Appetite:  Fair  Current Medications: Current Facility-Administered Medications  Medication Dose Route Frequency Provider Last Rate Last Dose  . acetaminophen (TYLENOL) tablet 650 mg  650 mg Oral Q6H PRN Gonzella Lex, MD   650 mg at 01/17/16 0536  . alum & mag hydroxide-simeth (MAALOX/MYLANTA) 200-200-20 MG/5ML suspension 30 mL  30 mL Oral Q4H PRN Gonzella Lex, MD      . atorvastatin (LIPITOR) tablet 40 mg  40 mg Oral q1800 Gonzella Lex, MD   40 mg at 01/23/16 1730  . carbamazepine (TEGRETOL) tablet 1,200 mg  1,200 mg Oral BID PC Gonzella Lex, MD   1,200 mg at 01/23/16 1729  . dextrose 5 %  solution 250 mL  250 mL Intravenous Once Gonzella Lex, MD      . enalapril (VASOTEC) tablet 5 mg  5 mg Oral BID Gonzella Lex, MD   5 mg at 01/23/16 [id number]  . fenofibrate tablet 160 mg  160 mg Oral Daily Gonzella Lex, MD   160 mg at 01/23/16 [id number]  . glucagon (human recombinant) (GLUCAGEN) injection 1 mg  1 mg Intramuscular Once PRN Gonzella Lex, MD      . haloperidol lactate (HALDOL) injection 20 mg  20 mg Intramuscular Q6H PRN Gonzella Lex, MD      . insulin glargine (LANTUS) injection 30 Units  30 Units Subcutaneous QHS Gonzella Lex, MD   30 Units at 01/22/16 2210  . lamoTRIgine (LAMICTAL) tablet 200 mg  200 mg Oral Daily Gonzella Lex, MD   200 mg at 01/23/16 [id number]  . lithium carbonate capsule 900 mg  900 mg Oral QHS Gonzella Lex, MD       . LORazepam (ATIVAN) injection 2 mg  2 mg Intramuscular Q6H PRN Gonzella Lex, MD      . LORazepam (ATIVAN) tablet 1 mg  1 mg Oral Q4H PRN Rainey Pines, MD   1 mg at 01/17/16 0545  . magnesium hydroxide (MILK OF MAGNESIA) suspension 30 mL  30 mL Oral Daily PRN Gonzella Lex, MD      . midazolam (VERSED) injection 2 mg  2 mg Intravenous Once Gonzella Lex, MD      . QUEtiapine (SEROQUEL) tablet 1,400 mg  1,400 mg Oral QHS Gonzella Lex, MD   1,400 mg at 01/22/16 2210  . temazepam (RESTORIL) capsule 15 mg  15 mg Oral QHS Gonzella Lex, MD   15 mg at 01/22/16 2211  . warfarin (COUMADIN) tablet 8 mg  8 mg Oral q1800 Gonzella Lex, MD   8 mg at 01/23/16 1730  . Warfarin - Pharmacist Dosing Inpatient   Does not apply q1800 Gonzella Lex, MD        Lab Results:  Results for orders placed or performed during the hospital encounter of 01/04/16 (from the past 48 hour(s))  Glucose, capillary     Status: None   Collection Time: 01/21/16  9:32 PM  Result Value Ref Range   Glucose-Capillary 99 65 - 99 mg/dL  Protime-INR     Status: Abnormal   Collection Time: 01/22/16  6:53 AM  Result Value Ref Range   Prothrombin Time 23.8 (H) 11.4 - 15.2 seconds   INR 2.09   Glucose, capillary     Status: None   Collection Time: 01/22/16  7:12 AM  Result Value Ref Range   Glucose-Capillary 94 65 - 99 mg/dL  Glucose, capillary     Status: Abnormal   Collection Time: 01/22/16 11:35 AM  Result Value Ref Range   Glucose-Capillary 135 (H) 65 - 99 mg/dL  Glucose, capillary     Status: Abnormal   Collection Time: 01/22/16  4:44 PM  Result Value Ref Range   Glucose-Capillary 135 (H) 65 - 99 mg/dL   Comment 1 Notify RN   Glucose, capillary     Status: Abnormal   Collection Time: 01/22/16 10:08 PM  Result Value Ref Range   Glucose-Capillary 156 (H) 65 - 99 mg/dL  Glucose, capillary     Status: Abnormal   Collection Time: 01/23/16  6:50 AM  Result Value Ref Range   Glucose-Capillary 102 (H) 65 - 99 mg/dL  Protime-INR     Status: Abnormal   Collection Time: 01/23/16  6:51 AM  Result Value Ref Range   Prothrombin Time 25.4 (H) 11.4 - 15.2 seconds   INR 2.27   CBC     Status: Abnormal   Collection Time: 01/23/16  6:51 AM  Result Value Ref Range   WBC 2.4 (L) 3.8 - 10.6 K/uL   RBC 4.24 (L) 4.40 - 5.90 MIL/uL   Hemoglobin 12.3 (L) 13.0 - 18.0 g/dL   HCT 36.4 (L) 40.0 - 52.0 %   MCV 85.9 80.0 - 100.0 fL   MCH 29.0 26.0 - 34.0 pg   MCHC 33.8 32.0 - 36.0 g/dL   RDW 13.4 11.5 - 14.5 %   Platelets 199 150 - 440 K/uL  Glucose, capillary     Status: Abnormal   Collection Time: 01/23/16 11:57 AM  Result Value Ref Range   Glucose-Capillary 134 (H) 65 - 99 mg/dL  Glucose, capillary     Status: None   Collection Time: 01/23/16  4:36 PM  Result Value Ref Range   Glucose-Capillary 88 65 - 99 mg/dL   Comment 1 Notify RN    Comment 2 Document in Chart     Blood Alcohol level:  Lab Results  Component Value Date   ETH <5 [id number]    Metabolic Disorder Labs: Lab Results  Component Value Date   HGBA1C 7.4 (H) 01/02/2016   MPG 192 12/04/2015   MPG 318 (H) 08/24/2010   Lab Results  Component Value Date   PROLACTIN 4.4 12/11/2015   Lab Results  Component Value Date   CHOL 143 12/11/2015   TRIG 286 (H) 12/11/2015   HDL 32 (L) 12/11/2015   CHOLHDL 4.5 12/11/2015   VLDL 57 (H) 12/11/2015   LDLCALC 54 12/11/2015   LDLCALC 56 12/05/2015    Physical Findings: AIMS: Facial and Oral Movements Muscles of Facial Expression: None, normal Lips and Perioral Area: None, normal Jaw: None, normal Tongue: None, normal,Extremity Movements Upper (arms, wrists, hands, fingers): None, normal Lower (legs, knees, ankles, toes): None, normal, Trunk Movements Neck, shoulders, hips: None, normal, Overall Severity Severity of abnormal movements (highest score from questions above): None, normal Incapacitation due to abnormal movements: None, normal Patient's awareness of abnormal movements (rate  only patient's report): No Awareness, Dental Status Current problems with teeth and/or dentures?: No Does patient usually wear dentures?: No  CIWA:    COWS:     Musculoskeletal: Strength & Muscle Tone: within normal limits Gait & Station: normal Patient leans: N/A  Psychiatric Specialty Exam: Physical Exam  Nursing note and vitals reviewed. Respiratory: No respiratory distress.  Skin:     Psychiatric: His affect is labile and inappropriate. His speech is tangential. He is hyperactive. Thought content is delusional. He expresses impulsivity. He expresses no homicidal and no suicidal ideation. He exhibits abnormal recent memory. He is inattentive.    Review of Systems  Constitutional: Negative.  Negative for chills, fever, malaise/fatigue and weight loss.  HENT: Negative.  Negative for hearing loss, nosebleeds, sore throat and tinnitus.   Eyes: Negative.  Negative for blurred vision and double vision.  Respiratory: Negative.  Negative for cough, hemoptysis, sputum production, shortness of breath and wheezing.   Cardiovascular: Negative.  Negative for chest pain, palpitations and claudication.  Gastrointestinal: Negative.  Negative for abdominal pain, blood in stool, constipation, diarrhea, heartburn, nausea and vomiting.  Genitourinary: Negative for dysuria, frequency and urgency.  Musculoskeletal: Negative.  Negative for back pain, falls, joint pain,  myalgias and neck pain.  Skin: Negative.  Negative for itching and rash.  Neurological: Negative.  Negative for dizziness, tingling, tremors, focal weakness, seizures and headaches.  Endo/Heme/Allergies: Negative for environmental allergies. Does not bruise/bleed easily.  Psychiatric/Behavioral: Negative for depression, hallucinations, memory loss, substance abuse and suicidal ideas. The patient is not nervous/anxious and does not have insomnia.     Blood pressure 125/73, pulse 86, temperature 98.5 F (36.9 C), temperature source Oral,  resp. rate 18, height 6' (1.829 m), weight 127.9 kg (282 lb), SpO2 100 %.Body mass index is 38.25 kg/m.  General Appearance: Disheveled  Eye Contact:  Fair  Speech:  Pressured  Volume:  Increased  Mood:  Euphoric  Affect:  Labile  Thought Process:  Disorganized  Orientation:  Full (Time, Place, and Person)  Thought Content:  Illogical, Delusions, Rumination and Tangential  Suicidal Thoughts:  No  Homicidal Thoughts:  No  Memory:  Immediate;   Fair Recent;   Poor Remote;   Fair  Judgement:  Impaired  Insight:  Shallow  Psychomotor Activity:  Increased  Concentration:  Concentration: Poor  Recall:  [place] of Knowledge:  Good  Language:  Good  Akathisia:  No  Handed:  Right  AIMS (if indicated):     Assets:  Financial Resources/Insurance Housing Social Support  ADL's:  Impaired  Cognition:  Impaired,  Mild  Sleep:  Number of Hours: 5.75     Treatment Plan Summary:  ECT on hold this week. Continues gradual improvement. No sign of any side effects. I will increase his lithium dose to 900 mg tonight. Continued daily monitoring. The question arises of whether we will be performing ECT next week. I think that that is still an open question.   Hyperlipidemia: We'll plan to continue Lipitor 40 mg by mouth daily and fenofibrate 160 mg by mouth daily  Cellulitis of the leg: Will continue Augmentin 1 tablet by mouth twice a day. Cellulitis is slowly improving.  History of pulmonary embolism: We'll continue Coumadin 8 mg by mouth nightly.  Disposition: The patient will be discharged home with his wife because he does have a stable living situation. Psychotropic medication management follow-up appointment as well as individual therapy will beScheduled with Dr. Thurmond Butts.  01/22/2016 No medication changes.  Alethia Berthold, MD 01/23/2016, 8:53 PM

## 2016-01-23 NOTE — Progress Notes (Signed)
Recreation Therapy Notes  Date: 08.22.17 Time: 9:30 am Location: Craft Room  Group Topic: Goal Setting  Goal Area(s) Addresses:  Patient will write one goal. Patient will write at least one supportive statement.  Behavioral Response: Did not attend   Intervention: Step By Step  Activity: Patients were given a foot worksheet and instructed to write a goal inside the foot. On the outside of the foot, patients were instructed to write supportive statements to help them reach their goals.  Education: LRT educated patients on healthy ways to celebrate reaching their goals.  Education Outcome: Patient did not attend group.   Clinical Observations/Feedback: Patient did not attend group.  [name], LRT/CTRS 01/23/2016 10:17 AM

## 2016-01-23 NOTE — Progress Notes (Signed)
ANTICOAGULATION CONSULT NOTE - Follow Up Consult  Pharmacy Consult for Warfarin Indication: VTE treatment  Allergies  Allergen Reactions  . Asa [Aspirin] Other (See Comments)    Patient tolerates LOW DOSE ASPIRIN.    Patient Measurements: Height: 6' (182.9 cm) Weight: 282 lb (127.9 kg) IBW/kg (Calculated) : 77.6  Vital Signs: Temp: 98.5 F (36.9 C) (08/22 0707) Temp Source: Oral (08/22 0707) BP: 125/73 (08/22 0707) Pulse Rate: 86 (08/22 0707)  Labs:  Recent Labs  01/21/16 [id number] 01/22/16 0653 01/23/16 0651  HGB  --   --  12.3*  HCT  --   --  36.4*  PLT  --   --  199  LABPROT 22.8* 23.8* 25.4*  INR 1.98 2.09 2.27   Estimated Creatinine Clearance: 75.5 mL/min (by C-G formula based on SCr of 1.6 mg/dL).  Medical History: Past Medical History:  Diagnosis Date  . Bipolar 1 disorder ([place])   . CKD (chronic kidney disease), stage III   . Diabetes mellitus without complication ([place])   . DVT (deep venous thrombosis) ([place])   . Hypercholesteremia   . Hypertension    Medications:  Warfarin   Assessment: 73 yom admitted to ED BHU with manic symptoms. Recently discharged from [facility] with DVT/PE, had been bridging LMWH and VKA prior to admission.  INR History: 7/9: INR - 1.99 7/10: no INR- warfarin 12.5 mg po daily at 0200 7/11: INR - 2.97- warfarin held 7/12: INR - 1.92 - warfarin 10 mg 7/13: INR - 1.95 - warfarin 10 mg 7/14: INR - 2.56 - warfarin 5 mg 7/15: INR - 2.73 - warfarin 5 mg 7/16: INR - 2.39 - warfarin 7.5 mg 7/17: INR - 2.22 - warfarin 7.5 mg 7/18: INR - 2.53 - warfarin 7.5 mg 7/19: INR - 2.75 - warfarin 7.5 mg 7/20: INR - 2.99 - warfarin 6.5 mg 7/21: INR - 2.64 - warfarin 6.5 mg  7/22: INR: 2.44; warfarin 8  7/23: INR: 2.60;  Warfarin 10mg  7/24: INR 2.44 - warfarin 10mg  7/25:  INR 3.22 - warfarin 10mg  7/26:  INR 3.08 - warfarin 8mg  7/27:  INR 3.24 - warfarin 8 mg 7/28:  INR 2.79 - 8 mg 7/29:  INR 2.74- 8 mg 7/30:  INR 3.19 - 7 mg 7/31:  INR 2.73   7.5mg  8/1:    INR 3.13  7.5mg  8/2:    INR 2.74  7.5mg  8/3     INR 2.27  7.5mg  8/4     INR 3.19  8 mg 8/5     INR 3.04  7.5 mg 8/6     INR 3.05  7.5mg  8/7:    INR  3.22  6mg  8/8:    INR  3.85  Dose held per protocol 8/9:    INR 2.4     6mg  8/10:  INR 1.93   7mg   8/11:  INR  2.00  7mg  8/12:  INR  2.26  6 mg 8/13:  INR 2.22   7 mg 8/14:  INR 2.18   7mg  8/15:  INR 2.42   NONE given 8/16:  INR 1.77  7.5mg   8/17:  INR 1.43  ? Not given 8/18:  INR Not done  Warfarin 7.5 mg 8/19: INR: 1.84; 7.5 mg  8/20: INR: 1.98  7.5mg  8/21:  INR 2.09  8 mg 8/22:  INR  2.27  Goal of Therapy:  INR 2-3 Monitor platelets by anticoagulation protocol: Yes   Plan:  Will increase warfarin to 8 mg PO daily.   (  Patient with orders for carbamazepine which interacts with warfarin to decrease the INR. Patient has orders for fenofibrate which interacts with warfarin to increase the INR. Will follow INR closely.)   Pt will need CBC q3 days per policy.  Pharmacy will continue to follow.   Alexine Pilant K, RPH 01/23/2016,8:36 AM

## 2016-01-23 NOTE — Plan of Care (Deleted)
Problem: Health Behavior/Discharge Planning: Goal: Compliance with treatment plan for underlying cause of condition will improve Outcome: Progressing Compliant with medications today, but continues to be irritable and belligerent with staff. Attends group, but defiant with group leader(s). Support, encouragement, and redirection provided. Will continue to monitor.

## 2016-01-23 NOTE — Progress Notes (Signed)
D: Continues to verbalize issues with wife/family regarding discharge. Reports he has plenty of people to come pick him up from the hospital. Tearful when talking about his issues. When nurse reminded pt that doctor would have to provide discharge before he is able to leave, pt responded, "we can talk about this at a later time" and left the room. Denies SI/HI/AVH. Interacts with peers in group.   A: Medication administered as ordered. Support and encouragement provided. Safety with 15 minutes checks.  R: Medication compliant. Attends groups. Safety maintained.

## 2016-01-24 LAB — GLUCOSE, CAPILLARY
GLUCOSE-CAPILLARY: 52 mg/dL — AB (ref 65–99)
GLUCOSE-CAPILLARY: 93 mg/dL (ref 65–99)
GLUCOSE-CAPILLARY: 177 mg/dL — AB (ref 65–99)
Glucose-Capillary: 69 mg/dL (ref 65–99)
Glucose-Capillary: 97 mg/dL (ref 65–99)
Glucose-Capillary: 147 mg/dL — ABNORMAL HIGH (ref 65–99)
Glucose-Capillary: 117 mg/dL — ABNORMAL HIGH (ref 65–99)

## 2016-01-24 LAB — PROTIME-INR
INR: 2.42
PROTHROMBIN TIME: 26.8 s — AB (ref 11.4–15.2)

## 2016-01-24 MED ORDER — WARFARIN SODIUM 7.5 MG PO TABS
7.5000 mg | ORAL_TABLET | Freq: Every day | ORAL | Status: DC
  Administered 2016-01-24 – 2016-02-01 (×9): 7.5 mg via ORAL
  Filled 2016-01-24 (×9): qty 1

## 2016-01-24 NOTE — Progress Notes (Signed)
A&Ox3, reduced manic symptoms, no more animated behavior such as "Elephant Noise", coherent and logical thought process, no intrusiveness, respects personal space of others and conduct himself well, attended the Wrap-Up Group. CBG=99 at bedtime, 30 Units of Lantus held until a feedback provided to Dr. Jerilee Hoh. 15 Units Lantus given per MD  Advice.

## 2016-01-24 NOTE — Plan of Care (Signed)
Problem: Safety: Goal: Periods of time without injury will increase Outcome: Progressing No injury during this shift

## 2016-01-24 NOTE — Progress Notes (Signed)
ANTICOAGULATION CONSULT NOTE - Follow Up Consult  Pharmacy Consult for Warfarin Indication: VTE treatment  Allergies  Allergen Reactions  . Asa [Aspirin] Other (See Comments)    Patient tolerates LOW DOSE ASPIRIN.    Patient Measurements: Height: 6' (182.9 cm) Weight: 282 lb (127.9 kg) IBW/kg (Calculated) : 77.6  Vital Signs: Temp: 98 F (36.7 C) (08/23 0500) Temp Source: Oral (08/23 0500) BP: 121/73 (08/23 0500) Pulse Rate: 82 (08/23 0500)  Labs:  Recent Labs  01/22/16 0653 01/23/16 0651 01/24/16 1008  HGB  --  12.3*  --   HCT  --  36.4*  --   PLT  --  199  --   LABPROT 23.8* 25.4* 26.8*  INR 2.09 2.27 2.42   Estimated Creatinine Clearance: 75.5 mL/min (by C-G formula based on SCr of 1.6 mg/dL).  Medical History: Past Medical History:  Diagnosis Date  . Bipolar 1 disorder ([place])   . CKD (chronic kidney disease), stage III   . Diabetes mellitus without complication ([place])   . DVT (deep venous thrombosis) ([place])   . Hypercholesteremia   . Hypertension    Medications:  Warfarin   Assessment: 68 yom admitted to ED BHU with manic symptoms. Recently discharged from [facility] with DVT/PE, had been bridging LMWH and VKA prior to admission.  INR History: 7/9: INR - 1.99 7/10: no INR- warfarin 12.5 mg po daily at 0200 7/11: INR - 2.97- warfarin held 7/12: INR - 1.92 - warfarin 10 mg 7/13: INR - 1.95 - warfarin 10 mg 7/14: INR - 2.56 - warfarin 5 mg 7/15: INR - 2.73 - warfarin 5 mg 7/16: INR - 2.39 - warfarin 7.5 mg 7/17: INR - 2.22 - warfarin 7.5 mg 7/18: INR - 2.53 - warfarin 7.5 mg 7/19: INR - 2.75 - warfarin 7.5 mg 7/20: INR - 2.99 - warfarin 6.5 mg 7/21: INR - 2.64 - warfarin 6.5 mg  7/22: INR: 2.44; warfarin 8  7/23: INR: 2.60;  Warfarin 10mg  7/24: INR 2.44 - warfarin 10mg  7/25:  INR 3.22 - warfarin 10mg  7/26:  INR 3.08 - warfarin 8mg  7/27:  INR 3.24 - warfarin 8 mg 7/28:  INR 2.79 - 8 mg 7/29:  INR 2.74- 8 mg 7/30:  INR 3.19 - 7 mg 7/31:  INR 2.73   7.5mg  8/1:    INR 3.13  7.5mg  8/2:    INR 2.74  7.5mg  8/3     INR 2.27  7.5mg  8/4     INR 3.19  8 mg 8/5     INR 3.04  7.5 mg 8/6     INR 3.05  7.5mg  8/7:    INR  3.22  6mg  8/8:    INR  3.85  Dose held per protocol 8/9:    INR 2.4     6mg  8/10:  INR 1.93   7mg   8/11:  INR  2.00  7mg  8/12:  INR  2.26  6 mg 8/13:  INR 2.22   7 mg 8/14:  INR 2.18   7mg  8/15:  INR 2.42   NONE given 8/16:  INR 1.77  7.5mg   8/17:  INR 1.43  ? Not given 8/18:  INR Not done  Warfarin 7.5 mg 8/19: INR: 1.84; 7.5 mg  8/20: INR: 1.98  7.5mg  8/21:  INR 2.09  8 mg 8/22:  INR  2.27 8 mg 8/23:  INR  2.42   Goal of Therapy:  INR 2-3 Monitor platelets by anticoagulation protocol: Yes   Plan:  Will adjust  warfarin to 7.5 mg PO daily.   (Patient with orders for carbamazepine which interacts with warfarin to decrease the INR. Patient has orders for fenofibrate which interacts with warfarin to increase the INR. Will follow INR closely.)   Pt will need CBC q3 days per policy.  Currently INR ordered daily  Pharmacy will continue to follow.   Keatin Benham A, RPH 01/24/2016,10:56 AM

## 2016-01-24 NOTE — Progress Notes (Signed)
Recreation Therapy Notes  Date: 08.23.17 Time: 1:00 pm Location: Craft Room  Group Topic: Self-esteem  Goal Area(s) Addresses:  Patient will identify positive traits about self. Patient will identify at least one healthy coping skill.  Behavioral Response: Attentive, Interactive  Intervention: All About Me  Activity: Patients were instructed to make an All About Me pamphlet including their life's motto, positive traits, healthy coping skills, and their support system.  Education: LRT educated patients on ways they can increase their self-esteem.  Education Outcome: In group clarification offered  Clinical Observations/Feedback: Patient spent most of the time focusing on his life motto. Patient wrote in the middle of his pamphlet during group discussion. Patient contributed to group discussion by stating ways to increase his self-esteem.  [name], LRT/CTRS 01/24/2016 3:04 PM

## 2016-01-24 NOTE — B[facility] Group Notes (Addendum)
[facility] LCSW Group Therapy *Late Entry  01/24/2016 2pm  Type of Therapy: Group Therapy   Participation Level: Active   Participation Quality: Attentive, Sharing and Supportive   Affect: Appropriate  Cognitive: Alert and Oriented   Insight: Developing/Improving and Engaged   Engagement in Therapy: Developing/Improving and Engaged   Modes of Intervention: Clarification, Confrontation, Discussion, Education, Exploration,  Limit-setting, Orientation, Problem-solving, Rapport Building, Art therapist, Socialization and Support  Summary of Progress/Problems: The topic for group therapy was feelings about diagnosis. Pt actively participated in group discussion on their past and current diagnosis and how they feel towards this. Pt also identified how society and family members judge them, based on their diagnosis as well as stereotypes and stigmas. Pt presented as pleasant and calm initially and was polite and cooperative with the CSW and other group members and focused and attentive to the topics discussed and the sharing of others.  As session progressed and pt began to share the pt increasingly became scattered in his thinking in speech with disconnected thoughts and speech.  P has, however, made great improvement during group, as evidenced by decreased manic sharing, sharing at length only when prompted and was easily re-directable.  Pt presents as happy and increasingly energetic, but at times did display short bursts of irritability at missing too much work.  [name]. Kristien Salatino, LCSWA, LCAS   .        [name]. Gerard Bonus, LCSWA, LCAS

## 2016-01-24 NOTE — Progress Notes (Signed)
[facility] MD Progress Note  01/24/2016 6:57 PM Cameron [name]  MRN:  [id number] Subjective:  Follow-up for Wednesday the 23rd. Patient seen. Chart reviewed. No real change in his mental status. Continues to be disorganized easily distracted but certainly improved from where he was when he first came in. He is able to hold a conversation but not really able to process new information. Principal Problem: Bipolar I disorder, current or most recent episode manic, with psychotic features ([place])   Diagnosis:   Patient Active Problem List   Diagnosis Date Noted  . Bipolar affective disorder, current episode manic ([place]) [F31.9] 01/04/2016  . Cellulitis of leg, right [L03.115] 01/01/2016  . Bipolar I disorder, current or most recent episode manic, with psychotic features ([place]) [F31.2] 12/11/2015  . Pulmonary embolism ([place]) [I26.99] 12/04/2015  . Diabetes mellitus without complication ([place]) [id number]   . Hypercholesteremia [E78.00]   . CKD (chronic kidney disease), stage III [N18.3]   . Acute deep vein thrombosis (DVT) of femoral vein of right lower extremity (HCC) [I82.411]    Total Time spent with patient: 20 minutes  Past Psychiatric History: Long history of bipolar disorder multiple manic episodes. History of response to ECT.  Past Medical History:  Past Medical History:  Diagnosis Date  . Bipolar 1 disorder ([place])   . CKD (chronic kidney disease), stage III   . Diabetes mellitus without complication ([place])   . DVT (deep venous thrombosis) ([place])   . Hypercholesteremia   . Hypertension     Past Surgical History:  Procedure Laterality Date  . APPENDECTOMY     Family History:  Family History  Problem Relation Age of Onset  . Stroke Other   . Diabetes Other    Family Psychiatric  History: Positive for bipolar disorder Social History:  History  Alcohol Use  . Yes    Comment: rarely     History  Drug Use No    Social History   Social History  . Marital status: Married    Spouse  name: N/A  . Number of children: N/A  . Years of education: N/A   Social History Main Topics  . Smoking status: Never Smoker  . Smokeless tobacco: Never Used  . Alcohol use Yes     Comment: rarely  . Drug use: No  . Sexual activity: Not Asked   Other Topics Concern  . None   Social History Narrative  . None   Additional Social History:                         Sleep: Fair  Appetite:  Fair  Current Medications: Current Facility-Administered Medications  Medication Dose Route Frequency Provider Last Rate Last Dose  . acetaminophen (TYLENOL) tablet 650 mg  650 mg Oral Q6H PRN Gonzella Lex, MD   650 mg at 01/17/16 0536  . alum & mag hydroxide-simeth (MAALOX/MYLANTA) 200-200-20 MG/5ML suspension 30 mL  30 mL Oral Q4H PRN Gonzella Lex, MD      . atorvastatin (LIPITOR) tablet 40 mg  40 mg Oral q1800 Gonzella Lex, MD   40 mg at 01/24/16 1650  . carbamazepine (TEGRETOL) tablet 1,200 mg  1,200 mg Oral BID PC Gonzella Lex, MD   1,200 mg at 01/24/16 1650  . dextrose 5 % solution 250 mL  250 mL Intravenous Once Gonzella Lex, MD      . enalapril (VASOTEC) tablet 5 mg  5 mg Oral BID Jenny Reichmann T  Deveion Denz, MD   5 mg at 01/24/16 0919  . fenofibrate tablet 160 mg  160 mg Oral Daily Gonzella Lex, MD   160 mg at 01/24/16 0920  . glucagon (human recombinant) (GLUCAGEN) injection 1 mg  1 mg Intramuscular Once PRN Gonzella Lex, MD      . haloperidol lactate (HALDOL) injection 20 mg  20 mg Intramuscular Q6H PRN Gonzella Lex, MD      . insulin glargine (LANTUS) injection 30 Units  30 Units Subcutaneous QHS Gonzella Lex, MD   15 Units at 01/23/16 2157  . lamoTRIgine (LAMICTAL) tablet 200 mg  200 mg Oral Daily Gonzella Lex, MD   200 mg at 01/24/16 0920  . lithium carbonate capsule 900 mg  900 mg Oral QHS Gonzella Lex, MD   900 mg at 01/23/16 2130  . LORazepam (ATIVAN) injection 2 mg  2 mg Intramuscular Q6H PRN Gonzella Lex, MD      . LORazepam (ATIVAN) tablet 1 mg  1 mg Oral  Q4H PRN Rainey [place]ines, MD   1 mg at 01/17/16 0545  . magnesium hydroxide (MILK OF MAGNESIA) suspension 30 mL  30 mL Oral Daily PRN Gonzella Lex, MD      . midazolam (VERSED) injection 2 mg  2 mg Intravenous Once Gonzella Lex, MD      . QUEtiapine (SEROQUEL) tablet 1,400 mg  1,400 mg Oral QHS Gonzella Lex, MD   1,400 mg at 01/23/16 2130  . temazepam (RESTORIL) capsule 15 mg  15 mg Oral QHS Gonzella Lex, MD   15 mg at 01/23/16 2130  . warfarin (COUMADIN) tablet 7.5 mg  7.5 mg Oral q1800 Gonzella Lex, MD   7.5 mg at 01/24/16 1650  . Warfarin - Pharmacist Dosing Inpatient   Does not apply q1800 Gonzella Lex, MD        Lab Results:  Results for orders placed or performed during the hospital encounter of 01/04/16 (from the past 48 hour(s))  Glucose, capillary     Status: Abnormal   Collection Time: 01/22/16 10:08 PM  Result Value Ref Range   Glucose-Capillary 156 (H) 65 - 99 mg/dL  Glucose, capillary     Status: Abnormal   Collection Time: 01/23/16  6:50 AM  Result Value Ref Range   Glucose-Capillary 102 (H) 65 - 99 mg/dL  Protime-INR     Status: Abnormal   Collection Time: 01/23/16  6:51 AM  Result Value Ref Range   Prothrombin Time 25.4 (H) 11.4 - 15.2 seconds   INR 2.27   CBC     Status: Abnormal   Collection Time: 01/23/16  6:51 AM  Result Value Ref Range   WBC 2.4 (L) 3.8 - 10.6 K/uL   RBC 4.24 (L) 4.40 - 5.90 MIL/uL   Hemoglobin 12.3 (L) 13.0 - 18.0 g/dL   HCT 36.4 (L) 40.0 - 52.0 %   MCV 85.9 80.0 - 100.0 fL   MCH 29.0 26.0 - 34.0 pg   MCHC 33.8 32.0 - 36.0 g/dL   RDW 13.4 11.5 - 14.5 %   Platelets 199 150 - 440 K/uL  Glucose, capillary     Status: Abnormal   Collection Time: 01/23/16 11:57 AM  Result Value Ref Range   Glucose-Capillary 134 (H) 65 - 99 mg/dL  Glucose, capillary     Status: None   Collection Time: 01/23/16  4:36 PM  Result Value Ref Range   Glucose-Capillary 88 65 -  99 mg/dL   Comment 1 Notify RN    Comment 2 Document in Chart   Glucose,  capillary     Status: None   Collection Time: 01/23/16  9:11 PM  Result Value Ref Range   Glucose-Capillary 99 65 - 99 mg/dL   Comment 1 Notify RN   Glucose, capillary     Status: None   Collection Time: 01/24/16  7:36 AM  Result Value Ref Range   Glucose-Capillary 97 65 - 99 mg/dL   Comment 1 Notify RN   Protime-INR     Status: Abnormal   Collection Time: 01/24/16 10:08 AM  Result Value Ref Range   Prothrombin Time 26.8 (H) 11.4 - 15.2 seconds   INR 2.42   Glucose, capillary     Status: Abnormal   Collection Time: 01/24/16 11:48 AM  Result Value Ref Range   Glucose-Capillary 147 (H) 65 - 99 mg/dL   Comment 1 Notify RN   Glucose, capillary     Status: Abnormal   Collection Time: 01/24/16  4:48 PM  Result Value Ref Range   Glucose-Capillary 117 (H) 65 - 99 mg/dL    Blood Alcohol level:  Lab Results  Component Value Date   ETH <5 [id number]    Metabolic Disorder Labs: Lab Results  Component Value Date   HGBA1C 7.4 (H) 01/02/2016   MPG 192 12/04/2015   MPG 318 (H) 08/24/2010   Lab Results  Component Value Date   PROLACTIN 4.4 12/11/2015   Lab Results  Component Value Date   CHOL 143 12/11/2015   TRIG 286 (H) 12/11/2015   HDL 32 (L) 12/11/2015   CHOLHDL 4.5 12/11/2015   VLDL 57 (H) 12/11/2015   LDLCALC 54 12/11/2015   LDLCALC 56 12/05/2015    Physical Findings: AIMS: Facial and Oral Movements Muscles of Facial Expression: None, normal Lips and Perioral Area: None, normal Jaw: None, normal Tongue: None, normal,Extremity Movements Upper (arms, wrists, hands, fingers): None, normal Lower (legs, knees, ankles, toes): None, normal, Trunk Movements Neck, shoulders, hips: None, normal, Overall Severity Severity of abnormal movements (highest score from questions above): None, normal Incapacitation due to abnormal movements: None, normal Patient's awareness of abnormal movements (rate only patient's report): No Awareness, Dental Status Current problems with teeth  and/or dentures?: No Does patient usually wear dentures?: No  CIWA:    COWS:     Musculoskeletal: Strength & Muscle Tone: within normal limits Gait & Station: normal Patient leans: N/A  Psychiatric Specialty Exam: Physical Exam  Nursing note and vitals reviewed. Respiratory: No respiratory distress.  Skin:     Psychiatric: His affect is labile and inappropriate. His speech is tangential. He is hyperactive. Thought content is delusional. He expresses impulsivity. He expresses no homicidal and no suicidal ideation. He exhibits abnormal recent memory. He is inattentive.    Review of Systems  Constitutional: Negative.  Negative for chills, fever, malaise/fatigue and weight loss.  HENT: Negative.  Negative for hearing loss, nosebleeds, sore throat and tinnitus.   Eyes: Negative.  Negative for blurred vision and double vision.  Respiratory: Negative.  Negative for cough, hemoptysis, sputum production, shortness of breath and wheezing.   Cardiovascular: Negative.  Negative for chest pain, palpitations and claudication.  Gastrointestinal: Negative.  Negative for abdominal pain, blood in stool, constipation, diarrhea, heartburn, nausea and vomiting.  Genitourinary: Negative for dysuria, frequency and urgency.  Musculoskeletal: Negative.  Negative for back pain, falls, joint pain, myalgias and neck pain.  Skin: Negative.  Negative for itching and  rash.  Neurological: Negative.  Negative for dizziness, tingling, tremors, focal weakness, seizures and headaches.  Endo/Heme/Allergies: Negative for environmental allergies. Does not bruise/bleed easily.  Psychiatric/Behavioral: Negative for depression, hallucinations, memory loss, substance abuse and suicidal ideas. The patient is not nervous/anxious and does not have insomnia.     Blood pressure 121/73, pulse 82, temperature 98 F (36.7 C), temperature source Oral, resp. rate 18, height 6' (1.829 m), weight 127.9 kg (282 lb), SpO2 100 %.Body mass  index is 38.25 kg/m.  General Appearance: Disheveled  Eye Contact:  Fair  Speech:  Pressured  Volume:  Increased  Mood:  Euphoric  Affect:  Labile  Thought Process:  Disorganized  Orientation:  Full (Time, Place, and Person)  Thought Content:  Illogical, Delusions, Rumination and Tangential  Suicidal Thoughts:  No  Homicidal Thoughts:  No  Memory:  Immediate;   Fair Recent;   Poor Remote;   Fair  Judgement:  Impaired  Insight:  Shallow  Psychomotor Activity:  Increased  Concentration:  Concentration: Poor  Recall:  [place] of Knowledge:  Good  Language:  Good  Akathisia:  No  Handed:  Right  AIMS (if indicated):     Assets:  Financial Resources/Insurance Housing Social Support  ADL's:  Impaired  Cognition:  Impaired,  Mild  Sleep:  Number of Hours: 6.15     Treatment Plan Summary:  I left a telephone message for his wife. I like to speak to her about her perception of how he is doing. He is tolerating medicine well. Too soon to check lithium level again. Continue treatment on the unit as usual for now. I am really hoping we can restart ECT next week.   Hyperlipidemia: We'll plan to continue Lipitor 40 mg by mouth daily and fenofibrate 160 mg by mouth daily  Cellulitis of the leg: Will continue Augmentin 1 tablet by mouth twice a day. Cellulitis is slowly improving.  History of pulmonary embolism: We'll continue Coumadin 8 mg by mouth nightly.  Disposition: The patient will be discharged home with his wife because he does have a stable living situation. Psychotropic medication management follow-up appointment as well as individual therapy will beScheduled with Dr. Thurmond Butts.  01/22/2016 No medication changes.  Alethia Berthold, MD 01/24/2016, 6:57 PM

## 2016-01-24 NOTE — [facility] Group Notes (Signed)
[facility] LCSW Group Therapy  01/24/2016 11:13 AM  Type of Therapy:  Group Therapy  Participation Level:  Active  Participation Quality:  Appropriate  Affect:  Appropriate  Cognitive:  Alert and Disorganized  Insight:  Improving  Engagement in Therapy:  Engaged  Modes of Intervention:  Activity, Discussion, Education and Support  Summary of Progress/Problems:Emotional Regulation: Patients will identify both negative and positive emotions. They will discuss emotions they have difficulty regulating and how they impact their lives. Patients will be asked to identify healthy coping skills to combat unhealthy reactions to negative emotions. Pt participated in group but needed some redirection to remain focused on the group discussion. Pt was called by the nurse station during group. Pt returned at the end of group but did not participate any further.    Areeb Corron G. [place], [place] 01/24/2016, 11:22 AM

## 2016-01-24 NOTE — Progress Notes (Signed)
Continues to be less intrusive, appropriate with interactions with staff and peers. Smiles today. Attended group. CBG 97 this morning before breakfast. Will continue to monitor.

## 2016-01-24 NOTE — Plan of Care (Signed)
Problem: Role Relationship: Goal: Ability to communicate needs accurately will improve Outcome: Progressing Interactions with staff and peers appropriate, continues to improve. Support and encouragement provided. Will continue to monitor.

## 2016-01-25 LAB — GLUCOSE, CAPILLARY
GLUCOSE-CAPILLARY: 123 mg/dL — AB (ref 65–99)
GLUCOSE-CAPILLARY: 180 mg/dL — AB (ref 65–99)
Glucose-Capillary: 105 mg/dL — ABNORMAL HIGH (ref 65–99)
Glucose-Capillary: 121 mg/dL — ABNORMAL HIGH (ref 65–99)
Glucose-Capillary: 106 mg/dL — ABNORMAL HIGH (ref 65–99)

## 2016-01-25 LAB — PROTIME-INR
INR: 2.51
PROTHROMBIN TIME: 27.6 s — AB (ref 11.4–15.2)

## 2016-01-25 MED ORDER — ADULT MULTIVITAMIN W/MINERALS CH
1.0000 | ORAL_TABLET | Freq: Every day | ORAL | Status: DC
  Administered 2016-01-25 – 2016-02-01 (×8): 1 via ORAL
  Filled 2016-01-25 (×8): qty 1

## 2016-01-25 NOTE — Progress Notes (Signed)
D: Patient upset that his wife is not allowing him t come home at present time.  Noted at slower pace on unit . Able to focus on  One topic. Less mania . Patient stated slept good last night .Stated appetite is good and energy level  Is normal. Stated concentration is good . Stated on Depression scale  0, hopeless 0 and anxiety 0 .( low 0-10 high) Denies suicidal  homicidal ideations  .  No auditory hallucinations  No pain concerns . Appropriate ADL'S. Interacting with peers and staff. Patient  Main focus was  Working on discharge. A: Encourage patient participation with unit programming . Instruction  Given on  Medication , verbalize understanding. R: Voice no other concerns. Staff continue to monitor

## 2016-01-25 NOTE — Plan of Care (Signed)
Problem: Education: Goal: Knowledge of the prescribed therapeutic regimen will improve Outcome: Progressing Patient can verbalize what medications he receives at bedtime.

## 2016-01-25 NOTE — Progress Notes (Signed)
[facility] MD Progress Note  01/25/2016 4:04 PM Cameron [name]  MRN:  [id number] Subjective: Follow-up for Thursday the 24th. Patient seen and treatment team held as well. Patient is essentially unchanged. He continues to have a somewhat labile and at times inappropriate affect although he is certainly calmer than previously. His insight is still poor and he still stays overly focused on inappropriate aspects of his condition. He has not been threatening violent or dangerous. He has been compliant with treatment. On the other hand he is telling me today that he will continue to refuse ECT next week even though I assure him that Dr. Thurmond Butts would want him to continue getting ECT. Principal Problem: Bipolar I disorder, current or most recent episode manic, with psychotic features ([place])   Diagnosis:   Patient Active Problem List   Diagnosis Date Noted  . Bipolar affective disorder, current episode manic ([place]) [F31.9] 01/04/2016  . Cellulitis of leg, right [L03.115] 01/01/2016  . Bipolar I disorder, current or most recent episode manic, with psychotic features ([place]) [F31.2] 12/11/2015  . Pulmonary embolism ([place]) [I26.99] 12/04/2015  . Diabetes mellitus without complication ([place]) [id number]   . Hypercholesteremia [E78.00]   . CKD (chronic kidney disease), stage III [N18.3]   . Acute deep vein thrombosis (DVT) of femoral vein of right lower extremity (HCC) [I82.411]    Total Time spent with patient: 20 minutes  Past Psychiatric History: Long history of bipolar disorder multiple manic episodes. History of response to ECT.  Past Medical History:  Past Medical History:  Diagnosis Date  . Bipolar 1 disorder ([place])   . CKD (chronic kidney disease), stage III   . Diabetes mellitus without complication ([place])   . DVT (deep venous thrombosis) ([place])   . Hypercholesteremia   . Hypertension     Past Surgical History:  Procedure Laterality Date  . APPENDECTOMY     Family History:  Family History  Problem  Relation Age of Onset  . Stroke Other   . Diabetes Other    Family Psychiatric  History: Positive for bipolar disorder Social History:  History  Alcohol Use  . Yes    Comment: rarely     History  Drug Use No    Social History   Social History  . Marital status: Married    Spouse name: N/A  . Number of children: N/A  . Years of education: N/A   Social History Main Topics  . Smoking status: Never Smoker  . Smokeless tobacco: Never Used  . Alcohol use Yes     Comment: rarely  . Drug use: No  . Sexual activity: Not Asked   Other Topics Concern  . None   Social History Narrative  . None   Additional Social History:                         Sleep: Fair  Appetite:  Fair  Current Medications: Current Facility-Administered Medications  Medication Dose Route Frequency Provider Last Rate Last Dose  . acetaminophen (TYLENOL) tablet 650 mg  650 mg Oral Q6H PRN Gonzella Lex, MD   650 mg at 01/17/16 0536  . alum & mag hydroxide-simeth (MAALOX/MYLANTA) 200-200-20 MG/5ML suspension 30 mL  30 mL Oral Q4H PRN Gonzella Lex, MD      . atorvastatin (LIPITOR) tablet 40 mg  40 mg Oral q1800 Gonzella Lex, MD   40 mg at 01/24/16 1650  . carbamazepine (TEGRETOL) tablet 1,200 mg  1,200  mg Oral BID PC Gonzella Lex, MD   1,200 mg at 01/25/16 0911  . dextrose 5 % solution 250 mL  250 mL Intravenous Once Gonzella Lex, MD      . enalapril (VASOTEC) tablet 5 mg  5 mg Oral BID Gonzella Lex, MD   5 mg at 01/25/16 0912  . fenofibrate tablet 160 mg  160 mg Oral Daily Gonzella Lex, MD   160 mg at 01/25/16 0912  . glucagon (human recombinant) (GLUCAGEN) injection 1 mg  1 mg Intramuscular Once PRN Gonzella Lex, MD      . haloperidol lactate (HALDOL) injection 20 mg  20 mg Intramuscular Q6H PRN Gonzella Lex, MD      . insulin glargine (LANTUS) injection 30 Units  30 Units Subcutaneous QHS Gonzella Lex, MD   30 Units at 01/24/16 2135  . lamoTRIgine (LAMICTAL) tablet 200 mg   200 mg Oral Daily Gonzella Lex, MD   200 mg at 01/25/16 0912  . lithium carbonate capsule 900 mg  900 mg Oral QHS Gonzella Lex, MD   900 mg at 01/24/16 2135  . LORazepam (ATIVAN) injection 2 mg  2 mg Intramuscular Q6H PRN Gonzella Lex, MD      . LORazepam (ATIVAN) tablet 1 mg  1 mg Oral Q4H PRN Rainey [place], MD   1 mg at 01/17/16 0545  . magnesium hydroxide (MILK OF MAGNESIA) suspension 30 mL  30 mL Oral Daily PRN Gonzella Lex, MD      . midazolam (VERSED) injection 2 mg  2 mg Intravenous Once Gonzella Lex, MD      . QUEtiapine (SEROQUEL) tablet 1,400 mg  1,400 mg Oral QHS Gonzella Lex, MD   1,400 mg at 01/24/16 2135  . temazepam (RESTORIL) capsule 15 mg  15 mg Oral QHS Gonzella Lex, MD   15 mg at 01/24/16 2135  . warfarin (COUMADIN) tablet 7.5 mg  7.5 mg Oral q1800 Gonzella Lex, MD   7.5 mg at 01/24/16 1650  . Warfarin - Pharmacist Dosing Inpatient   Does not apply q1800 Gonzella Lex, MD        Lab Results:  Results for orders placed or performed during the hospital encounter of 01/04/16 (from the past 48 hour(s))  Glucose, capillary     Status: None   Collection Time: 01/23/16  4:36 PM  Result Value Ref Range   Glucose-Capillary 88 65 - 99 mg/dL   Comment 1 Notify RN    Comment 2 Document in Chart   Glucose, capillary     Status: None   Collection Time: 01/23/16  9:11 PM  Result Value Ref Range   Glucose-Capillary 99 65 - 99 mg/dL   Comment 1 Notify RN   Glucose, capillary     Status: None   Collection Time: 01/24/16  7:36 AM  Result Value Ref Range   Glucose-Capillary 97 65 - 99 mg/dL   Comment 1 Notify RN   Protime-INR     Status: Abnormal   Collection Time: 01/24/16 10:08 AM  Result Value Ref Range   Prothrombin Time 26.8 (H) 11.4 - 15.2 seconds   INR 2.42   Glucose, capillary     Status: Abnormal   Collection Time: 01/24/16 11:48 AM  Result Value Ref Range   Glucose-Capillary 147 (H) 65 - 99 mg/dL   Comment 1 Notify RN   Glucose, capillary     Status:  Abnormal  Collection Time: 01/24/16  4:48 PM  Result Value Ref Range   Glucose-Capillary 117 (H) 65 - 99 mg/dL  Glucose, capillary     Status: Abnormal   Collection Time: 01/24/16  9:33 PM  Result Value Ref Range   Glucose-Capillary 177 (H) 65 - 99 mg/dL  Glucose, capillary     Status: Abnormal   Collection Time: 01/25/16  6:45 AM  Result Value Ref Range   Glucose-Capillary 105 (H) 65 - 99 mg/dL   Comment 1 Notify RN   Protime-INR     Status: Abnormal   Collection Time: 01/25/16 10:10 AM  Result Value Ref Range   Prothrombin Time 27.6 (H) 11.4 - 15.2 seconds   INR 2.51   Glucose, capillary     Status: Abnormal   Collection Time: 01/25/16 11:56 AM  Result Value Ref Range   Glucose-Capillary 121 (H) 65 - 99 mg/dL   Comment 1 Notify RN     Blood Alcohol level:  Lab Results  Component Value Date   ETH <5 [id number]    Metabolic Disorder Labs: Lab Results  Component Value Date   HGBA1C 7.4 (H) 01/02/2016   MPG 192 12/04/2015   MPG 318 (H) 08/24/2010   Lab Results  Component Value Date   PROLACTIN 4.4 12/11/2015   Lab Results  Component Value Date   CHOL 143 12/11/2015   TRIG 286 (H) 12/11/2015   HDL 32 (L) 12/11/2015   CHOLHDL 4.5 12/11/2015   VLDL 57 (H) 12/11/2015   LDLCALC 54 12/11/2015   LDLCALC 56 12/05/2015    Physical Findings: AIMS: Facial and Oral Movements Muscles of Facial Expression: None, normal Lips and Perioral Area: None, normal Jaw: None, normal Tongue: None, normal,Extremity Movements Upper (arms, wrists, hands, fingers): None, normal Lower (legs, knees, ankles, toes): None, normal, Trunk Movements Neck, shoulders, hips: None, normal, Overall Severity Severity of abnormal movements (highest score from questions above): None, normal Incapacitation due to abnormal movements: None, normal Patient's awareness of abnormal movements (rate only patient's report): No Awareness, Dental Status Current problems with teeth and/or dentures?: No Does  patient usually wear dentures?: No  CIWA:    COWS:     Musculoskeletal: Strength & Muscle Tone: within normal limits Gait & Station: normal Patient leans: N/A  Psychiatric Specialty Exam: Physical Exam  Nursing note and vitals reviewed. Respiratory: No respiratory distress.  Skin:     Psychiatric: His affect is labile and inappropriate. His speech is tangential. He is hyperactive. Thought content is delusional. He expresses impulsivity. He expresses no homicidal and no suicidal ideation. He exhibits abnormal recent memory. He is inattentive.    Review of Systems  Constitutional: Negative.  Negative for chills, fever, malaise/fatigue and weight loss.  HENT: Negative.  Negative for hearing loss, nosebleeds, sore throat and tinnitus.   Eyes: Negative.  Negative for blurred vision and double vision.  Respiratory: Negative.  Negative for cough, hemoptysis, sputum production, shortness of breath and wheezing.   Cardiovascular: Negative.  Negative for chest pain, palpitations and claudication.  Gastrointestinal: Negative.  Negative for abdominal pain, blood in stool, constipation, diarrhea, heartburn, nausea and vomiting.  Genitourinary: Negative for dysuria, frequency and urgency.  Musculoskeletal: Negative.  Negative for back pain, falls, joint pain, myalgias and neck pain.  Skin: Negative.  Negative for itching and rash.  Neurological: Negative.  Negative for dizziness, tingling, tremors, focal weakness, seizures and headaches.  Endo/Heme/Allergies: Negative for environmental allergies. Does not bruise/bleed easily.  Psychiatric/Behavioral: Negative for depression, hallucinations, memory loss, substance  abuse and suicidal ideas. The patient is not nervous/anxious and does not have insomnia.     Blood pressure 130/87, pulse 85, temperature 98.2 F (36.8 C), temperature source Oral, resp. rate 18, height 6' (1.829 m), weight 127.9 kg (282 lb), SpO2 100 %.Body mass index is 38.25 kg/m.    General Appearance: Disheveled  Eye Contact:  Fair  Speech:  Pressured  Volume:  Increased  Mood:  Euphoric  Affect:  Labile  Thought Process:  Disorganized  Orientation:  Full (Time, Place, and Person)  Thought Content:  Illogical, Delusions, Rumination and Tangential  Suicidal Thoughts:  No  Homicidal Thoughts:  No  Memory:  Immediate;   Fair Recent;   Poor Remote;   Fair  Judgement:  Impaired  Insight:  Shallow  Psychomotor Activity:  Increased  Concentration:  Concentration: Poor  Recall:  [place] of Knowledge:  Good  Language:  Good  Akathisia:  No  Handed:  Right  AIMS (if indicated):     Assets:  Financial Resources/Insurance Housing Social Support  ADL's:  Impaired  Cognition:  Impaired,  Mild  Sleep:  Number of Hours: 7     Treatment Plan Summary:  Still haven't yet heard back from his wife and I will try to contact her again. Probably in another day we will check the lithium level. I will continue to press him to agree for ECT treatment on Monday. I'm still very hesitant to consider discharge when his insight is poor and he is still this hypomanic with disorganized thinking. Treatment plan completed. Still anticipate at least 7 days of likely inpatient treatment. His INR has been up and down. Pharmacy suggested adding a multivitamin which is certainly fine with me.   Hyperlipidemia: We'll plan to continue Lipitor 40 mg by mouth daily and fenofibrate 160 mg by mouth daily  Cellulitis of the leg: Will continue Augmentin 1 tablet by mouth twice a day. Cellulitis is slowly improving.  History of pulmonary embolism: We'll continue Coumadin 8 mg by mouth nightly.  Disposition: The patient will be discharged home with his wife because he does have a stable living situation. Psychotropic medication management follow-up appointment as well as individual therapy will beScheduled with Dr. Thurmond Butts.  01/22/2016 No medication changes.  Alethia Berthold, MD 01/25/2016, 4:04  PM

## 2016-01-25 NOTE — Plan of Care (Signed)
Problem: Health Behavior/Discharge Planning: Goal: Ability to manage health-related needs will improve Outcome: Progressing Patient   Less manic  Patient  Focusing on one topic

## 2016-01-25 NOTE — Progress Notes (Signed)
D:Patient appears more flat this yesterday. Still tangential. Denies SI/HI/AVH. Visible in the milieu. Interacts with peers well. A: Medication given with education. Encouragement provided.  R: Patient was compliant with medication. He has remained calm and cooperative. Safety maintained with 15 min checks

## 2016-01-25 NOTE — Progress Notes (Signed)
Psychiatry: This is just a brief note to report that I have attempted to sign the treatment plan today but have not been able to discover the procedure for doing that in the computer. However I would like documented that I have intended to sign the treatment plan as it is today.

## 2016-01-25 NOTE — Progress Notes (Signed)
ANTICOAGULATION CONSULT NOTE - Follow Up Consult  Pharmacy Consult for Warfarin Indication: VTE treatment  Allergies  Allergen Reactions  . Asa [Aspirin] Other (See Comments)    Patient tolerates LOW DOSE ASPIRIN.    Patient Measurements: Height: 6' (182.9 cm) Weight: 282 lb (127.9 kg) IBW/kg (Calculated) : 77.6  Vital Signs: Temp: 98.2 F (36.8 C) (08/24 0659) Temp Source: Oral (08/24 0659) BP: 130/87 (08/24 0659) Pulse Rate: 85 (08/24 0659)  Labs:  Recent Labs  01/23/16 0651 01/24/16 1008 01/25/16 1010  HGB 12.3*  --   --   HCT 36.4*  --   --   PLT 199  --   --   LABPROT 25.4* 26.8* 27.6*  INR 2.27 2.42 2.51   CrCl cannot be calculated (Patient's most recent lab result is older than the maximum 21 days allowed.).  Medical History: Past Medical History:  Diagnosis Date  . Bipolar 1 disorder ([place])   . CKD (chronic kidney disease), stage III   . Diabetes mellitus without complication ([place])   . DVT (deep venous thrombosis) ([place])   . Hypercholesteremia   . Hypertension    Medications:  Warfarin   Assessment: 21 yom admitted to ED B[facility] with manic symptoms. Recently discharged from [facility] with DVT/PE, had been bridging LMWH and VKA prior to admission.  INR History: 7/9: INR - 1.99 7/10: no INR- warfarin 12.5 mg po daily at 0200 7/11: INR - 2.97- warfarin held 7/12: INR - 1.92 - warfarin 10 mg 7/13: INR - 1.95 - warfarin 10 mg 7/14: INR - 2.56 - warfarin 5 mg 7/15: INR - 2.73 - warfarin 5 mg 7/16: INR - 2.39 - warfarin 7.5 mg 7/17: INR - 2.22 - warfarin 7.5 mg 7/18: INR - 2.53 - warfarin 7.5 mg 7/19: INR - 2.75 - warfarin 7.5 mg 7/20: INR - 2.99 - warfarin 6.5 mg 7/21: INR - 2.64 - warfarin 6.5 mg  7/22: INR: 2.44; warfarin 8  7/23: INR: 2.60;  Warfarin 10mg  7/24: INR 2.44 - warfarin 10mg  7/25:  INR 3.22 - warfarin 10mg  7/26:  INR 3.08 - warfarin 8mg  7/27:  INR 3.24 - warfarin 8 mg 7/28:  INR 2.79 - 8 mg 7/29:  INR 2.74- 8 mg 7/30:  INR 3.19 - 7  mg 7/31:  INR 2.73  7.5mg  8/1:    INR 3.13  7.5mg  8/2:    INR 2.74  7.5mg  8/3     INR 2.27  7.5mg  8/4     INR 3.19  8 mg 8/5     INR 3.04  7.5 mg 8/6     INR 3.05  7.5mg  8/7:    INR  3.22  6mg  8/8:    INR  3.85  Dose held per protocol 8/9:    INR 2.4     6mg  8/10:  INR 1.93   7mg   8/11:  INR  2.00  7mg  8/12:  INR  2.26  6 mg 8/13:  INR 2.22   7 mg 8/14:  INR 2.18   7mg  8/15:  INR 2.42   NONE given 8/16:  INR 1.77  7.5mg   8/17:  INR 1.43  ? Not given 8/18:  INR Not done  Warfarin 7.5 mg 8/19: INR: 1.84; 7.5 mg  8/20: INR: 1.98  7.5mg  8/21:  INR 2.09  8 mg 8/22:  INR  2.27 8 mg 8/23:  INR  2.42  7.5mg  8/24:  INR  2.51  Goal of Therapy:  INR 2-3 Monitor platelets by  anticoagulation protocol: Yes   Plan:  Will continue warfarin to 7.5 mg PO daily.   (Patient with orders for carbamazepine which interacts with warfarin to decrease the INR. Patient has orders for fenofibrate which interacts with warfarin to increase the INR. Will follow INR closely.)   Pt will need CBC q3 days per policy.  Currently INR ordered daily  Pharmacy will continue to follow.   Reinhart Saulters K, RPH 01/25/2016,11:58 AM

## 2016-01-25 NOTE — Tx Team (Signed)
Interdisciplinary Treatment and Diagnostic Plan Update  01/25/2016 Time of Session: 3:00pm Cameron [name] MRN: [id number]  Principal Diagnosis: Bipolar I disorder, current or most recent episode manic, with psychotic features ([place])  Secondary Diagnoses: Principal Problem:   Bipolar I disorder, current or most recent episode manic, with psychotic features ([place]) Active Problems:   Diabetes mellitus without complication ([place])   Hypercholesteremia   CKD (chronic kidney disease), stage III   Cellulitis of leg, right   Bipolar affective disorder, current episode manic ([place])   Current Medications:  Current Facility-Administered Medications  Medication Dose Route Frequency Provider Last Rate Last Dose  . acetaminophen (TYLENOL) tablet 650 mg  650 mg Oral Q6H PRN Gonzella Lex, MD   650 mg at 01/17/16 0536  . alum & mag hydroxide-simeth (MAALOX/MYLANTA) 200-200-20 MG/5ML suspension 30 mL  30 mL Oral Q4H PRN Gonzella Lex, MD      . atorvastatin (LIPITOR) tablet 40 mg  40 mg Oral q1800 Gonzella Lex, MD   40 mg at 01/24/16 1650  . carbamazepine (TEGRETOL) tablet 1,200 mg  1,200 mg Oral BID PC Gonzella Lex, MD   1,200 mg at 01/25/16 0911  . dextrose 5 % solution 250 mL  250 mL Intravenous Once Gonzella Lex, MD      . enalapril (VASOTEC) tablet 5 mg  5 mg Oral BID Gonzella Lex, MD   5 mg at 01/25/16 0912  . fenofibrate tablet 160 mg  160 mg Oral Daily Gonzella Lex, MD   160 mg at 01/25/16 0912  . glucagon (human recombinant) (GLUCAGEN) injection 1 mg  1 mg Intramuscular Once PRN Gonzella Lex, MD      . haloperidol lactate (HALDOL) injection 20 mg  20 mg Intramuscular Q6H PRN Gonzella Lex, MD      . insulin glargine (LANTUS) injection 30 Units  30 Units Subcutaneous QHS Gonzella Lex, MD   30 Units at 01/24/16 2135  . lamoTRIgine (LAMICTAL) tablet 200 mg  200 mg Oral Daily Gonzella Lex, MD   200 mg at 01/25/16 0912  . lithium carbonate capsule 900 mg  900 mg Oral QHS Gonzella Lex, MD   900 mg at 01/24/16 2135  . LORazepam (ATIVAN) injection 2 mg  2 mg Intramuscular Q6H PRN Gonzella Lex, MD      . LORazepam (ATIVAN) tablet 1 mg  1 mg Oral Q4H PRN Rainey [place], MD   1 mg at 01/17/16 0545  . magnesium hydroxide (MILK OF MAGNESIA) suspension 30 mL  30 mL Oral Daily PRN Gonzella Lex, MD      . midazolam (VERSED) injection 2 mg  2 mg Intravenous Once Gonzella Lex, MD      . QUEtiapine (SEROQUEL) tablet 1,400 mg  1,400 mg Oral QHS Gonzella Lex, MD   1,400 mg at 01/24/16 2135  . temazepam (RESTORIL) capsule 15 mg  15 mg Oral QHS Gonzella Lex, MD   15 mg at 01/24/16 2135  . warfarin (COUMADIN) tablet 7.5 mg  7.5 mg Oral q1800 Gonzella Lex, MD   7.5 mg at 01/24/16 1650  . Warfarin - Pharmacist Dosing Inpatient   Does not apply [id number] Gonzella Lex, MD       PTA Medications: Prescriptions Prior to Admission  Medication Sig Dispense Refill Last Dose  . amoxicillin-clavulanate (AUGMENTIN) 875-125 MG tablet Take 1 tablet by mouth 2 (two) times daily. X 7 more days 14 tablet  0 01/14/2016  . ARIPiprazole (ABILIFY) 30 MG tablet Take 30 mg by mouth every evening.    01/14/2016  . atorvastatin (LIPITOR) 40 MG tablet Take 40 mg by mouth daily.  11 01/14/2016  . carbamazepine (TEGRETOL) 200 MG tablet Take 2 tablets (400 mg total) by mouth every morning. 30 tablet 0 01/14/2016  . carbamazepine (TEGRETOL) 200 MG tablet Take 3 tablets (600 mg total) by mouth every evening. 30 tablet 0 01/14/2016  . enalapril (VASOTEC) 5 MG tablet Take 5 mg by mouth 2 (two) times daily.   01/19/2016 at 0838  . fenofibrate 160 MG tablet Take 160 mg by mouth daily.   01/14/2016  . insulin aspart (NOVOLOG) 100 UNIT/ML injection Inject 0-9 Units into the skin 3 (three) times daily with meals. 10 mL 11 01/14/2016  . insulin aspart (NOVOLOG) 100 UNIT/ML injection Inject 0-5 Units into the skin at bedtime. 10 mL 11 01/14/2016  . Insulin Glargine (LANTUS SOLOSTAR) 100 UNIT/ML Solostar Pen Inject 60 Units into  the skin daily.    01/14/2016  . lamoTRIgine (LAMICTAL) 100 MG tablet Take 400 mg by mouth at bedtime.   01/14/2016  . Multiple Vitamin (MULTIVITAMIN WITH MINERALS) TABS tablet Take 1 tablet by mouth daily.   01/14/2016  . QUEtiapine (SEROQUEL) 400 MG tablet Take 3 tablets (1,200 mg total) by mouth at bedtime. 90 tablet 0 01/14/2016  . TANZEUM 50 MG PEN Inject 50 mg into the skin every 7 (seven) days.  2 01/14/2016  . temazepam (RESTORIL) 15 MG capsule Take 15 mg by mouth at bedtime as needed for sleep.   01/14/2016  . warfarin (COUMADIN) 7.5 MG tablet Take 1 tablet (7.5 mg total) by mouth daily at 6 PM. 30 tablet 0 01/14/2016    Treatment Modalities: Medication Management, Group therapy, Case management,  1 to 1 session with clinician, Psychoeducation, Recreational therapy.   Physician Treatment Plan for Primary Diagnosis: Bipolar I disorder, current or most recent episode manic, with psychotic features ([place]) Long Term Goal(s): Improvement in symptoms so as ready for discharge   Short Term Goals: Ability to demonstrate self-control will improve  Medication Management: Evaluate patient's response, side effects, and tolerance of medication regimen.  Therapeutic Interventions: 1 to 1 sessions, Unit Group sessions and Medication administration.  Evaluation of Outcomes: Progressing  Physician Treatment Plan for Secondary Diagnosis: Principal Problem:   Bipolar I disorder, current or most recent episode manic, with psychotic features ([place]) Active Problems:   Diabetes mellitus without complication ([place])   Hypercholesteremia   CKD (chronic kidney disease), stage III   Cellulitis of leg, right   Bipolar affective disorder, current episode manic ([place])  Long Term Goal(s): Improvement in symptoms so as ready for discharge  Short Term Goals: Ability to identify changes in lifestyle to reduce recurrence of condition will improve, Ability to demonstrate self-control will improve and Ability to maintain  clinical measurements within normal limits will improve  Medication Management: Evaluate patient's response, side effects, and tolerance of medication regimen.  Therapeutic Interventions: 1 to 1 sessions, Unit Group sessions and Medication administration.  Evaluation of Outcomes: Progressing   RN Treatment Plan for Primary Diagnosis: Bipolar I disorder, current or most recent episode manic, with psychotic features ([place]) Long Term Goal(s): Knowledge of disease and therapeutic regimen to maintain health will improve  Short Term Goals: Ability to remain free from injury will improve, Ability to verbalize frustration and anger appropriately will improve, Ability to demonstrate self-control, Ability to participate in decision making will improve, Ability  to verbalize feelings will improve, Ability to disclose and discuss suicidal ideas, Ability to identify and develop effective coping behaviors will improve and Compliance with prescribed medications will improve  Medication Management: RN will administer medications as ordered by provider, will assess and evaluate patient's response and provide education to patient for prescribed medication. RN will report any adverse and/or side effects to prescribing provider.  Therapeutic Interventions: 1 on 1 counseling sessions, Psychoeducation, Medication administration, Evaluate responses to treatment, Monitor vital signs and CBGs as ordered, Perform/monitor CIWA, COWS, AIMS and Fall Risk screenings as ordered, Perform wound care treatments as ordered.  Evaluation of Outcomes: Progressing   LCSW Treatment Plan for Primary Diagnosis: Bipolar I disorder, current or most recent episode manic, with psychotic features ([place]) Long Term Goal(s): Safe transition to appropriate next level of care at discharge, Engage patient in therapeutic group addressing interpersonal concerns.  Short Term Goals: Engage patient in aftercare planning with referrals and resources,  Increase social support, Increase ability to appropriately verbalize feelings, Increase emotional regulation, Facilitate acceptance of mental health diagnosis and concerns, Identify triggers associated with mental health/substance abuse issues and Increase skills for wellness and recovery  Therapeutic Interventions: Assess for all discharge needs, 1 to 1 time with Social worker, Explore available resources and support systems, Assess for adequacy in community support network, Educate family and significant other(s) on suicide prevention, Complete Psychosocial Assessment, Interpersonal group therapy.  Evaluation of Outcomes: Progressing   Progress in Treatment: Attending groups: Yes. Participating in groups: Yes. Taking medication as prescribed: Yes. Toleration medication: Yes. Family/Significant other contact made: Yes, individual(s) contacted:  wife Patient understands diagnosis: Yes. Discussing patient identified problems/goals with staff: Yes. Medical problems stabilized or resolved: Yes. Denies suicidal/homicidal ideation: Yes. Issues/concerns per patient self-inventory: Yes. Other: discharge planning  New problem(s) identified: n/a  New Short Term/Long Term Goal(s): n/a  Discharge Plan or Barriers: Patient will discharge with follow-up with his psychiatrist Dr. Thurmond Butts. Patient is still exhibiting manic behaviors.    Reason for Continuation of Hospitalization: Mania  Estimated Length of Stay: 7 days  Attendees: Patient: Cameron [name] 01/25/2016 3:09 PM  Physician: Dr. Weber Cooks, MD 01/25/2016 3:09 PM  Nursing: Elige Radon, RN 01/25/2016 3:09 PM  RN Care Manager: 01/25/2016 3:09 PM  Social Worker: Lear Ng. Claybon Jabs MSW, [place] 01/25/2016 3:09 PM  Recreational Therapist: [name], LRT/CTRS 01/25/2016 3:09 PM  Other:  01/25/2016 3:09 PM  Other:  01/25/2016 3:09 PM  Other: 01/25/2016 3:09 PM    Scribe for Treatment Team: Lear Ng. [place], [facility] 01/25/2016 3:35 PM

## 2016-01-25 NOTE — Progress Notes (Signed)
D: Patient appears brighter on the unit. He had a visit with his wife and daughter and stated it went well. Denies SI/HI/AVH. Appears less tangential and disorganized. Visible in the milieu. Interacts with peers well. A: Medication given with education. Encouragement provided.  R: Patient was compliant with medication. He has remained calm and cooperative. Safety maintained with 15 min checks

## 2016-01-25 NOTE — Progress Notes (Signed)
Recreation Therapy Notes  Date: 08.24.17 Time: 9:30 am Location: Craft Room  Group Topic: Leisure Education  Goal Area(s) Addresses:  Patient will identify things they are grateful for. Patient will be educated on why it is important to be grateful.  Behavioral Response: Attentive, Interactive, Left early  Intervention: Grateful Wheel  Activity: Patients were given an I Am Grateful For worksheet and instructed to write things they were grateful for under each category.  Education: LRT educated patients on why it is important to be grateful.   Education Outcome: Patient left before LRT educated group.  Clinical Observations/Feedback: Patient completed activity by writing some things he was grateful for under certain categories. Patient contributed to group discussion by stating some things he was grateful for. Patient was called to the nurse's station at 10:12 am. Patient did not return to group.  [name], LRT/CTRS 01/25/2016 10:28 AM

## 2016-01-25 NOTE — BHH Group Notes (Signed)
[place] LCSW Group Therapy Note  Type of Therapy and Topic:  Group Therapy:  Goals Group: SMART Goals  Participation Level:   Patient attended group on this date. Patient participated in goal setting and was able to share openly with the group.   Description of Group:    The purpose of a daily goals group is to assist and guide patients in setting recovery/wellness-related goals.  The objective is to set goals as they relate to the crisis in which they were admitted. Patients will be using SMART goal modalities to set measurable goals.  Characteristics of realistic goals will be discussed and patients will be assisted in setting and processing how one will reach their goal. Facilitator will also assist patients in applying interventions and coping skills learned in psycho-education groups to the SMART goal and process how one will achieve defined goal.  Therapeutic Goals: -Patients will develop and document one goal related to or their crisis in which brought them into treatment. -Patients will be guided by LCSW using SMART goal setting modality in how to set a measurable, attainable, realistic and time sensitive goal.  -Patients will process barriers in reaching goal. -Patients will process interventions In how to overcome and successful in reaching goal.   Summary of Patient Progress:  Patient Goal: Patient states his goals are to discharge and be able to integrate back into his home with wife and daughter.   Therapeutic Modalities:   Motivational Interviewing Public relations account executive Therapy Crisis Intervention Model SMART goals setting  Aasia Peavler G. [place], [place] 01/25/2016 11:40 AM

## 2016-01-25 NOTE — Plan of Care (Signed)
Problem: Physical Regulation: Goal: Will remain free from infection Outcome: Progressing No signs or symptoms of cellulitis.

## 2016-01-25 NOTE — BHH Group Notes (Signed)
[place] LCSW Group Therapy  01/25/2016 2:03 PM  Type of Therapy:  Group Therapy  Participation Level:  Active  Participation Quality:  Appropriate, Attentive, Sharing and Supportive  Affect:  Appropriate  Cognitive:  Appropriate  Insight:  Improving  Engagement in Therapy:  Engaged  Modes of Intervention:  Discussion, Education and Support  Summary of Progress/Problems:Balance in life: Patients will discuss the concept of balance and how it looks and feels to be unbalanced. Pt will identify areas in their life that is unbalanced and ways to become more balanced. Pt was sharing in group and stated he is ready to discharge. CSW processed with pt about his discharged and what he needs to be successful when he returns home. Pt stated he needs to take his medications, cooperate with his wife, and follow-up with his outpatient psychiatrist.    Lear Ng. Claybon Jabs MSW, [place] 01/25/2016, 2:07 PM

## 2016-01-25 NOTE — BHH Group Notes (Signed)
[employer] Group Notes:  (Nursing/MHT/Case Management/Adjunct)  Date:  01/25/2016  Time:  4:18 PM  Type of Therapy:  Psychoeducational Skills  Participation Level:  Minimal  Participation Quality:  Attentive and Drowsy  Affect:  Appropriate  Cognitive:  Appropriate  Insight:  Appropriate  Engagement in Group:  None  Modes of Intervention:  Discussion and Education  Summary of Progress/Problems:  Charise Killian 01/25/2016, 4:18 PM

## 2016-01-26 LAB — GLUCOSE, CAPILLARY
GLUCOSE-CAPILLARY: 155 mg/dL — AB (ref 65–99)
GLUCOSE-CAPILLARY: 100 mg/dL — AB (ref 65–99)
Glucose-Capillary: 96 mg/dL (ref 65–99)
Glucose-Capillary: 105 mg/dL — ABNORMAL HIGH (ref 65–99)

## 2016-01-26 LAB — PROTIME-INR
INR: 2.39
Prothrombin Time: 26.5 seconds — ABNORMAL HIGH (ref 11.4–15.2)

## 2016-01-26 NOTE — Plan of Care (Signed)
Problem: Education: Goal: Mental status will improve Outcome: Progressing Pt with organized thoughts

## 2016-01-26 NOTE — BHH Suicide Risk Assessment (Signed)
[facility] INPATIENT:  Family/Significant Other Suicide Prevention Education  Suicide Prevention Education:  Education Completed;Floyd Yglesias (wife) [phone] option 4 ext. [phone]), has been identified by the patient as the family member/significant other with whom the patient will be residing, and identified as the person(s) who will aid the patient in the event of a mental health crisis (suicidal ideations/suicide attempt).  With written consent from the patient, the family member/significant other has been provided the following suicide prevention education, prior to the and/or following the discharge of the patient.  The suicide prevention education provided includes the following:  Suicide risk factors  Suicide prevention and interventions  National Suicide Hotline telephone number  [facility] assessment telephone number  [facility] Emergency Assistance [place] and/or Residential Mobile Crisis Unit telephone number  Request made of family/significant other to:  Remove weapons (e.g., guns, rifles, knives), all items previously/currently identified as safety concern.    Remove drugs/medications (over-the-counter, prescriptions, illicit drugs), all items previously/currently identified as a safety concern.  The family member/significant other verbalizes understanding of the suicide prevention education information provided.  The family member/significant other agrees to remove the items of safety concern listed above. Family member informed CSW that whenever patient discharges, he will have to readjust to being home before he has to return to work. Patients job is understanding of patients needs.   Indiah Heyden G. [place], [place] 01/26/2016, 10:59 AM

## 2016-01-26 NOTE — Progress Notes (Signed)
[facility] MD Progress Note  01/26/2016 6:24 PM Cameron [name]  MRN:  [id number] Subjective: Follow-up for Friday the 25th. Patient seen. I also spoke with his wife this morning. Patient met with Dr. Thurmond Butts who came to visit him today and also spoke with his wife. He now says that he is agreeable to ECT continuing into the next week. Meanwhile he actually seemed to be much better today than I have seen him before. Not sure if this is just a transient improvement but he was much calmer and more appropriate and lucid during our conversation. Principal Problem: Bipolar I disorder, current or most recent episode manic, with psychotic features ([place])   Diagnosis:   Patient Active Problem List   Diagnosis Date Noted  . Bipolar affective disorder, current episode manic ([place]) [F31.9] 01/04/2016  . Cellulitis of leg, right [L03.115] 01/01/2016  . Bipolar I disorder, current or most recent episode manic, with psychotic features ([place]) [F31.2] 12/11/2015  . Pulmonary embolism ([place]) [I26.99] 12/04/2015  . Diabetes mellitus without complication ([place]) [N05.3]   . Hypercholesteremia [E78.00]   . CKD (chronic kidney disease), stage III [N18.3]   . Acute deep vein thrombosis (DVT) of femoral vein of right lower extremity (HCC) [I82.411]    Total Time spent with patient: 20 minutes  Past Psychiatric History: Long history of bipolar disorder multiple manic episodes. History of response to ECT.  Past Medical History:  Past Medical History:  Diagnosis Date  . Bipolar 1 disorder ([place])   . CKD (chronic kidney disease), stage III   . Diabetes mellitus without complication ([place])   . DVT (deep venous thrombosis) ([place])   . Hypercholesteremia   . Hypertension     Past Surgical History:  Procedure Laterality Date  . APPENDECTOMY     Family History:  Family History  Problem Relation Age of Onset  . Stroke Other   . Diabetes Other    Family Psychiatric  History: Positive for bipolar disorder Social History:    History  Alcohol Use  . Yes    Comment: rarely     History  Drug Use No    Social History   Social History  . Marital status: Married    Spouse name: N/A  . Number of children: N/A  . Years of education: N/A   Social History Main Topics  . Smoking status: Never Smoker  . Smokeless tobacco: Never Used  . Alcohol use Yes     Comment: rarely  . Drug use: No  . Sexual activity: Not Asked   Other Topics Concern  . None   Social History Narrative  . None   Additional Social History:                         Sleep: Fair  Appetite:  Fair  Current Medications: Current Facility-Administered Medications  Medication Dose Route Frequency Provider Last Rate Last Dose  . acetaminophen (TYLENOL) tablet 650 mg  650 mg Oral Q6H PRN Gonzella Lex, MD   650 mg at 01/17/16 0536  . alum & mag hydroxide-simeth (MAALOX/MYLANTA) 200-200-20 MG/5ML suspension 30 mL  30 mL Oral Q4H PRN Gonzella Lex, MD      . atorvastatin (LIPITOR) tablet 40 mg  40 mg Oral q1800 Gonzella Lex, MD   40 mg at 01/26/16 1719  . carbamazepine (TEGRETOL) tablet 1,200 mg  1,200 mg Oral BID PC Gonzella Lex, MD   1,200 mg at 01/26/16 1719  .  dextrose 5 % solution 250 mL  250 mL Intravenous Once Gonzella Lex, MD      . enalapril (VASOTEC) tablet 5 mg  5 mg Oral BID Gonzella Lex, MD   5 mg at 01/26/16 0910  . fenofibrate tablet 160 mg  160 mg Oral Daily Gonzella Lex, MD   160 mg at 01/26/16 0910  . glucagon (human recombinant) (GLUCAGEN) injection 1 mg  1 mg Intramuscular Once PRN Gonzella Lex, MD      . haloperidol lactate (HALDOL) injection 20 mg  20 mg Intramuscular Q6H PRN Gonzella Lex, MD      . insulin glargine (LANTUS) injection 30 Units  30 Units Subcutaneous QHS Gonzella Lex, MD   30 Units at 01/25/16 2304  . lamoTRIgine (LAMICTAL) tablet 200 mg  200 mg Oral Daily Gonzella Lex, MD   200 mg at 01/26/16 0910  . lithium carbonate capsule 900 mg  900 mg Oral QHS Gonzella Lex, MD    900 mg at 01/25/16 2304  . LORazepam (ATIVAN) injection 2 mg  2 mg Intramuscular Q6H PRN Gonzella Lex, MD      . LORazepam (ATIVAN) tablet 1 mg  1 mg Oral Q4H PRN Rainey [place], MD   1 mg at 01/17/16 0545  . magnesium hydroxide (MILK OF MAGNESIA) suspension 30 mL  30 mL Oral Daily PRN Gonzella Lex, MD      . midazolam (VERSED) injection 2 mg  2 mg Intravenous Once Gonzella Lex, MD      . multivitamin with minerals tablet 1 tablet  1 tablet Oral Daily Gonzella Lex, MD   1 tablet at 01/26/16 0910  . QUEtiapine (SEROQUEL) tablet 1,400 mg  1,400 mg Oral QHS Gonzella Lex, MD   1,400 mg at 01/25/16 2303  . temazepam (RESTORIL) capsule 15 mg  15 mg Oral QHS Gonzella Lex, MD   15 mg at 01/25/16 2303  . warfarin (COUMADIN) tablet 7.5 mg  7.5 mg Oral q1800 Gonzella Lex, MD   7.5 mg at 01/26/16 1719  . Warfarin - Pharmacist Dosing Inpatient   Does not apply q1800 Gonzella Lex, MD        Lab Results:  Results for orders placed or performed during the hospital encounter of 01/04/16 (from the past 48 hour(s))  Glucose, capillary     Status: Abnormal   Collection Time: 01/24/16  9:33 PM  Result Value Ref Range   Glucose-Capillary 177 (H) 65 - 99 mg/dL  Glucose, capillary     Status: Abnormal   Collection Time: 01/25/16  6:45 AM  Result Value Ref Range   Glucose-Capillary 105 (H) 65 - 99 mg/dL   Comment 1 Notify RN   Protime-INR     Status: Abnormal   Collection Time: 01/25/16 10:10 AM  Result Value Ref Range   Prothrombin Time 27.6 (H) 11.4 - 15.2 seconds   INR 2.51   Glucose, capillary     Status: Abnormal   Collection Time: 01/25/16 11:56 AM  Result Value Ref Range   Glucose-Capillary 121 (H) 65 - 99 mg/dL   Comment 1 Notify RN   Glucose, capillary     Status: Abnormal   Collection Time: 01/25/16  4:09 PM  Result Value Ref Range   Glucose-Capillary 123 (H) 65 - 99 mg/dL   Comment 1 Notify RN   Glucose, capillary     Status: Abnormal   Collection Time: 01/25/16  8:26  PM   Result Value Ref Range   Glucose-Capillary 106 (H) 65 - 99 mg/dL  Glucose, capillary     Status: Abnormal   Collection Time: 01/25/16 11:12 PM  Result Value Ref Range   Glucose-Capillary 180 (H) 65 - 99 mg/dL  Glucose, capillary     Status: None   Collection Time: 01/26/16  6:58 AM  Result Value Ref Range   Glucose-Capillary 96 65 - 99 mg/dL   Comment 1 Notify RN   Protime-INR     Status: Abnormal   Collection Time: 01/26/16  7:27 AM  Result Value Ref Range   Prothrombin Time 26.5 (H) 11.4 - 15.2 seconds   INR 2.39   Glucose, capillary     Status: Abnormal   Collection Time: 01/26/16 11:40 AM  Result Value Ref Range   Glucose-Capillary 155 (H) 65 - 99 mg/dL   Comment 1 Notify RN    Comment 2 Document in Chart   Glucose, capillary     Status: Abnormal   Collection Time: 01/26/16  4:37 PM  Result Value Ref Range   Glucose-Capillary 100 (H) 65 - 99 mg/dL   Comment 1 Notify RN     Blood Alcohol level:  Lab Results  Component Value Date   ETH <5 95/62/1308    Metabolic Disorder Labs: Lab Results  Component Value Date   HGBA1C 7.4 (H) 01/02/2016   MPG 192 12/04/2015   MPG 318 (H) 08/24/2010   Lab Results  Component Value Date   PROLACTIN 4.4 12/11/2015   Lab Results  Component Value Date   CHOL 143 12/11/2015   TRIG 286 (H) 12/11/2015   HDL 32 (L) 12/11/2015   CHOLHDL 4.5 12/11/2015   VLDL 57 (H) 12/11/2015   LDLCALC 54 12/11/2015   LDLCALC 56 12/05/2015    Physical Findings: AIMS: Facial and Oral Movements Muscles of Facial Expression: None, normal Lips and Perioral Area: None, normal Jaw: None, normal Tongue: None, normal,Extremity Movements Upper (arms, wrists, hands, fingers): None, normal Lower (legs, knees, ankles, toes): None, normal, Trunk Movements Neck, shoulders, hips: None, normal, Overall Severity Severity of abnormal movements (highest score from questions above): None, normal Incapacitation due to abnormal movements: None,  normal Patient's awareness of abnormal movements (rate only patient's report): No Awareness, Dental Status Current problems with teeth and/or dentures?: No Does patient usually wear dentures?: No  CIWA:    COWS:     Musculoskeletal: Strength & Muscle Tone: within normal limits Gait & Station: normal Patient leans: N/A  Psychiatric Specialty Exam: Physical Exam  Nursing note and vitals reviewed. Respiratory: No respiratory distress.  Skin:     Psychiatric: He has a normal mood and affect. His affect is not labile and not inappropriate. His speech is not tangential. He is not hyperactive. Thought content is not delusional. He expresses impulsivity. He expresses no homicidal and no suicidal ideation. He exhibits abnormal recent memory. He is inattentive.    Review of Systems  Constitutional: Negative.  Negative for chills, fever, malaise/fatigue and weight loss.  HENT: Negative.  Negative for hearing loss, nosebleeds, sore throat and tinnitus.   Eyes: Negative.  Negative for blurred vision and double vision.  Respiratory: Negative.  Negative for cough, hemoptysis, sputum production, shortness of breath and wheezing.   Cardiovascular: Negative.  Negative for chest pain, palpitations and claudication.  Gastrointestinal: Negative.  Negative for abdominal pain, blood in stool, constipation, diarrhea, heartburn, nausea and vomiting.  Genitourinary: Negative for dysuria, frequency and urgency.  Musculoskeletal: Negative.  Negative for back pain, falls, joint pain, myalgias and neck pain.  Skin: Negative.  Negative for itching and rash.  Neurological: Negative.  Negative for dizziness, tingling, tremors, focal weakness, seizures and headaches.  Endo/Heme/Allergies: Negative for environmental allergies. Does not bruise/bleed easily.  Psychiatric/Behavioral: Negative for depression, hallucinations, memory loss, substance abuse and suicidal ideas. The patient is not nervous/anxious and does not  have insomnia.     Blood pressure 111/61, pulse 67, temperature 97.8 F (36.6 C), temperature source Oral, resp. rate 18, height 6' (1.829 m), weight 127.9 kg (282 lb), SpO2 100 %.Body mass index is 38.25 kg/m.  General Appearance: Disheveled  Eye Contact:  Fair  Speech:  Pressured  Volume:  Increased  Mood:  Euphoric  Affect:  Labile  Thought Process:  Disorganized  Orientation:  Full (Time, Place, and Person)  Thought Content:  Illogical, Delusions, Rumination and Tangential  Suicidal Thoughts:  No  Homicidal Thoughts:  No  Memory:  Immediate;   Fair Recent;   Poor Remote;   Fair  Judgement:  Impaired  Insight:  Shallow  Psychomotor Activity:  Increased  Concentration:  Concentration: Poor  Recall:  [place] of Knowledge:  Good  Language:  Good  Akathisia:  No  Handed:  Right  AIMS (if indicated):     Assets:  Financial Resources/Insurance Housing Social Support  ADL's:  Impaired  Cognition:  Impaired,  Mild  Sleep:  Number of Hours: 6     Treatment Plan Summary:  I will check lithium level for tomorrow. No change to medicine. I am tentatively putting him on the schedule for ECT on Monday although he really does look quite a bit better. I promised him that I will not try to do treatment if I don't think it's going to be helpful for him. He is tentatively agreeable. No other change to plan for now.  Hyperlipidemia: We'll plan to continue Lipitor 40 mg by mouth daily and fenofibrate 160 mg by mouth daily  Cellulitis of the leg: Will continue Augmentin 1 tablet by mouth twice a day. Cellulitis is slowly improving.  History of pulmonary embolism: We'll continue Coumadin 8 mg by mouth nightly.  Disposition: The patient will be discharged home with his wife because he does have a stable living situation. Psychotropic medication management follow-up appointment as well as individual therapy will beScheduled with Dr. Thurmond Butts.  01/22/2016 No medication changes.  Alethia Berthold, MD 01/26/2016, 6:24 PM

## 2016-01-26 NOTE — Progress Notes (Signed)
Recreation Therapy Notes  Date: 08.25.17 Time: 9:30 am Location: Craft Room  Group Topic: Coping Skills  Goal Area(s) Addresses:  Patient will participate in healthy coping skill. Patient will verbalize use of art as a coping skill.  Behavioral Response: Attentive, Interactive  Intervention: Coloring  Activity: Patients were given coloring sheets to color. Patients were instructed to think about the emotions they were feeling and what their mind was focused on while they were coloring.  Education: LRT educated patients on healthy coping skills.  Education Outcome: Acknowledges education/In group clarification offered  Clinical Observations/Feedback: Patient colored coloring sheet. Patient contributed to group discussion by stating what emotions he felt while he was coloring and what his mind was focused on.  [name], LRT/CTRS 01/26/2016 10:16 AM

## 2016-01-26 NOTE — Progress Notes (Signed)
Patient ID: Cameron [name], male   DOB: Jun 09, 1965, 50 y.o.   MRN: [id number]  CSW contacted patients wife Maxx Bagnato [phone] option [phone] ext. [phone]. CSW provided update to wife about patients progress. Wife stated that patients seems to be doing a lot better but is still manic and has a poor sense of reality. CSW informed wife that Dr. Weber Cooks plans to continue ECT treatment with patient. Wife had no other questions or concerns for CSW at this time.   CSW also contacted patients outpatient psychiatrist, Dr. Thurmond Butts. CSW informed his secretary Quita Skye that patient is requesting to speak with Dr. Thurmond Butts before continuing ECT treatment. Secretary informed CSW that she will inform Dr. Thurmond Butts so that he can plan to either speak or visit [name] as soon as possible.   Liviana Mills G. Claybon Jabs MSW, [place] 01/26/2016 11:22 AM

## 2016-01-26 NOTE — Progress Notes (Signed)
ANTICOAGULATION CONSULT NOTE - Follow Up Consult  Pharmacy Consult for Warfarin Indication: VTE treatment  Allergies  Allergen Reactions  . Asa [Aspirin] Other (See Comments)    Patient tolerates LOW DOSE ASPIRIN.    Patient Measurements: Height: 6' (182.9 cm) Weight: 282 lb (127.9 kg) IBW/kg (Calculated) : 77.6  Vital Signs: Temp: 97.8 F (36.6 C) (08/25 0712) Temp Source: Oral (08/25 0712) BP: 111/61 (08/25 0712) Pulse Rate: 67 (08/25 0712)  Labs:  Recent Labs  01/24/16 1008 01/25/16 1010 01/26/16 0727  LABPROT 26.8* 27.6* 26.5*  INR 2.42 2.51 2.39   CrCl cannot be calculated (Patient's most recent lab result is older than the maximum 21 days allowed.).  Medical History: Past Medical History:  Diagnosis Date  . Bipolar 1 disorder ([place])   . CKD (chronic kidney disease), stage III   . Diabetes mellitus without complication ([place])   . DVT (deep venous thrombosis) ([place])   . Hypercholesteremia   . Hypertension    Medications:  Warfarin   Assessment: 73 yom admitted to ED B[facility] with manic symptoms. Recently discharged from [facility] with DVT/PE, had been bridging LMWH and VKA prior to admission.  INR History: 7/9: INR - 1.99 7/10: no INR- warfarin 12.5 mg po daily at 0200 7/11: INR - 2.97- warfarin held 7/12: INR - 1.92 - warfarin 10 mg 7/13: INR - 1.95 - warfarin 10 mg 7/14: INR - 2.56 - warfarin 5 mg 7/15: INR - 2.73 - warfarin 5 mg 7/16: INR - 2.39 - warfarin 7.5 mg 7/17: INR - 2.22 - warfarin 7.5 mg 7/18: INR - 2.53 - warfarin 7.5 mg 7/19: INR - 2.75 - warfarin 7.5 mg 7/20: INR - 2.99 - warfarin 6.5 mg 7/21: INR - 2.64 - warfarin 6.5 mg  7/22: INR: 2.44; warfarin 8  7/23: INR: 2.60;  Warfarin 10mg  7/24: INR 2.44 - warfarin 10mg  7/25:  INR 3.22 - warfarin 10mg  7/26:  INR 3.08 - warfarin 8mg  7/27:  INR 3.24 - warfarin 8 mg 7/28:  INR 2.79 - 8 mg 7/29:  INR 2.74- 8 mg 7/30:  INR 3.19 - 7 mg 7/31:  INR 2.73  7.5mg  8/1:    INR 3.13  7.5mg  8/2:    INR 2.74   7.5mg  8/3     INR 2.27  7.5mg  8/4     INR 3.19  8 mg 8/5     INR 3.04  7.5 mg 8/6     INR 3.05  7.5mg  8/7:    INR  3.22  6mg  8/8:    INR  3.85  Dose held per protocol 8/9:    INR 2.4     6mg  8/10:  INR 1.93   7mg   8/11:  INR  2.00  7mg  8/12:  INR  2.26  6 mg 8/13:  INR 2.22   7 mg 8/14:  INR 2.18   7mg  8/15:  INR 2.42   NONE given 8/16:  INR 1.77  7.5mg   8/17:  INR 1.43  ? Not given 8/18:  INR Not done  Warfarin 7.5 mg 8/19: INR: 1.84; 7.5 mg  8/20: INR: 1.98  7.5mg  8/21:  INR 2.09  8 mg 8/22:  INR  2.27 8 mg 8/23:  INR  2.42  7.5mg  8/24:  INR  2.51  7.5mg  8/25:  INR  2.39  Goal of Therapy:  INR 2-3 Monitor platelets by anticoagulation protocol: Yes   Plan:  Will continue warfarin to 7.5 mg PO daily.   (Patient with orders  for carbamazepine which interacts with warfarin to decrease the INR. Patient has orders for fenofibrate which interacts with warfarin to increase the INR. Will follow INR closely.)   Pt will need CBC q3 days per policy.  Currently INR ordered daily  Pharmacy will continue to follow.   Thoms Barthelemy K, RPH 01/26/2016,9:49 AM

## 2016-01-26 NOTE — Progress Notes (Signed)
Pleasant and cooperative. Organized today. Reports wanting to sleep due to lack of sleep the past few weeks. Patient agrees to have ECT on Monday. Denies SI, HI, AVH. No negative behaviors noted or reported. Med and group compliant. Appropriate with staff and peers.  Encouragement and support offered. Pt receptive and remains safe on unit with q 15 min checks.

## 2016-01-27 LAB — CBC
HCT: 36 % — ABNORMAL LOW (ref 40.0–52.0)
Hemoglobin: 12.2 g/dL — ABNORMAL LOW (ref 13.0–18.0)
MCH: 28.5 pg (ref 26.0–34.0)
MCHC: 33.8 g/dL (ref 32.0–36.0)
MCV: 84.3 fL (ref 80.0–100.0)
PLATELETS: 197 10*3/uL (ref 150–440)
RBC: 4.27 MIL/uL — ABNORMAL LOW (ref 4.40–5.90)
RDW: 13.2 % (ref 11.5–14.5)
WBC: 2.8 10*3/uL — ABNORMAL LOW (ref 3.8–10.6)

## 2016-01-27 LAB — GLUCOSE, CAPILLARY
GLUCOSE-CAPILLARY: 147 mg/dL — AB (ref 65–99)
GLUCOSE-CAPILLARY: 122 mg/dL — AB (ref 65–99)
Glucose-Capillary: 66 mg/dL (ref 65–99)
Glucose-Capillary: 76 mg/dL (ref 65–99)
Glucose-Capillary: 124 mg/dL — ABNORMAL HIGH (ref 65–99)
Glucose-Capillary: 150 mg/dL — ABNORMAL HIGH (ref 65–99)

## 2016-01-27 LAB — PROTIME-INR
INR: 2.13
PROTHROMBIN TIME: 24.2 s — AB (ref 11.4–15.2)

## 2016-01-27 LAB — LITHIUM LEVEL: Lithium Lvl: 1 mmol/L (ref 0.60–1.20)

## 2016-01-27 NOTE — Progress Notes (Signed)
D:  Denies SI/HI/AVH.  Patient is pleasant and cooperative.  Visible in the milieu, interacting appropriately.   A:  Support and encouragement offered.  Scheduled medications administered according to orders. R:  Receptive to treatment regiment.  Safety maintained.

## 2016-01-27 NOTE — [facility] Group Notes (Signed)
[place] Group Notes:  (Nursing/MHT/Case Management/Adjunct)  Date:  01/27/2016  Time:  4:50 AM  Type of Therapy:  Psychoeducational Skills  Participation Level:  Did Not Attend  Summary of Progress/Problems:  [name] 01/27/2016, 4:50 AM

## 2016-01-27 NOTE — Progress Notes (Signed)
Pt Denies SI/HI/AVH. Pt medication compliant. Pleasant and appropriate during interaction. Pt noted to be less hyper verbal and intrusive. Pt also noted not to be pacing unit as frequently. Visible in milieu socializing with peers and staff. Voices no additional concerns at this time. Safety maintained.

## 2016-01-27 NOTE — Progress Notes (Signed)
At 1630 patient complain of feeling a little nauseated.  Diet ginger ale given and saltine crackers with good results.

## 2016-01-27 NOTE — Progress Notes (Signed)
D:  Patient complained of feeling dizzy upon standing after eating lunch.  A: Vital signs obtained and were WNL.  CBG 147.

## 2016-01-27 NOTE — [facility] Group Notes (Signed)
[place] LCSW Group Therapy  01/27/2016 3:07 PM  Type of Therapy:  Group Therapy  Participation Level:  Pt did not attend group. CSW invited pt to group.   Summary of Progress/Problems:Communications: Patients identify how individuals communicate with one another appropriately and inappropriately. Patients will be guided to discuss their thoughts, feelings, and behaviors related to barriers when communicating. The group will process together ways to execute positive and appropriate communications.   Ceonna Frazzini G. Claybon Jabs MSW, [place] 01/27/2016, 3:07 PM

## 2016-01-27 NOTE — Progress Notes (Signed)
[facility] MD Progress Note  01/27/2016 1:09 PM Cameron [name]  MRN:  [id number] Subjective: Follow-up for Friday the 25th. Patient seen. I also spoke with his wife this morning. Patient met with Dr. Thurmond Butts who came to visit him today and also spoke with his wife. He now says that he is agreeable to ECT continuing into the next week. Meanwhile he actually seemed to be much better today than I have seen him before. Not sure if this is just a transient improvement but he was much calmer and more appropriate and lucid during our conversation.  Patient was calm, pleasant and cooperative. He was appropriate during interaction. He was redirectable, history process was linear. He told me about the visit with his outpatient psychiatrist yesterday. He denies having SI or HI or hallucinations. He has been is sleeping well. His only complaint is that he started feeling dizzy after lunch.  Per nursing: Pt Denies SI/HI/AVH. Pt medication compliant. Pleasant and appropriate during interaction. Pt noted to be less hyper verbal and intrusive. Pt also noted not to be pacing unit as frequently. Visible in milieu socializing with peers and staff. Voices no additional concerns at this time. Safety maintained.  Principal Problem: Bipolar I disorder, current or most recent episode manic, with psychotic features ([place])   Diagnosis:   Patient Active Problem List   Diagnosis Date Noted  . Bipolar affective disorder, current episode manic ([place]) [F31.9] 01/04/2016  . Cellulitis of leg, right [L03.115] 01/01/2016  . Bipolar I disorder, current or most recent episode manic, with psychotic features ([place]) [F31.2] 12/11/2015  . Pulmonary embolism ([place]) [I26.99] 12/04/2015  . Diabetes mellitus without complication ([place]) [F02.7]   . Hypercholesteremia [E78.00]   . CKD (chronic kidney disease), stage III [N18.3]   . Acute deep vein thrombosis (DVT) of femoral vein of right lower extremity (HCC) [I82.411]    Total Time spent with  patient: 20 minutes  Past Psychiatric History: Long history of bipolar disorder multiple manic episodes. History of response to ECT.  Past Medical History:  Past Medical History:  Diagnosis Date  . Bipolar 1 disorder ([place])   . CKD (chronic kidney disease), stage III   . Diabetes mellitus without complication ([place])   . DVT (deep venous thrombosis) ([place])   . Hypercholesteremia   . Hypertension     Past Surgical History:  Procedure Laterality Date  . APPENDECTOMY     Family History:  Family History  Problem Relation Age of Onset  . Stroke Other   . Diabetes Other    Family Psychiatric  History: Positive for bipolar disorder Social History:  History  Alcohol Use  . Yes    Comment: rarely     History  Drug Use No    Social History   Social History  . Marital status: Married    Spouse name: N/A  . Number of children: N/A  . Years of education: N/A   Social History Main Topics  . Smoking status: Never Smoker  . Smokeless tobacco: Never Used  . Alcohol use Yes     Comment: rarely  . Drug use: No  . Sexual activity: Not Asked   Other Topics Concern  . None   Social History Narrative  . None   Current Medications: Current Facility-Administered Medications  Medication Dose Route Frequency Provider Last Rate Last Dose  . acetaminophen (TYLENOL) tablet 650 mg  650 mg Oral Q6H PRN Gonzella Lex, MD   650 mg at 01/17/16 0536  . alum &  mag hydroxide-simeth (MAALOX/MYLANTA) 200-200-20 MG/5ML suspension 30 mL  30 mL Oral Q4H PRN Gonzella Lex, MD      . atorvastatin (LIPITOR) tablet 40 mg  40 mg Oral q1800 Gonzella Lex, MD   40 mg at 01/26/16 1719  . carbamazepine (TEGRETOL) tablet 1,200 mg  1,200 mg Oral BID PC Gonzella Lex, MD   1,200 mg at 01/27/16 0939  . dextrose 5 % solution 250 mL  250 mL Intravenous Once Gonzella Lex, MD      . enalapril (VASOTEC) tablet 5 mg  5 mg Oral BID Gonzella Lex, MD   5 mg at 01/27/16 0939  . fenofibrate tablet 160 mg  160 mg  Oral Daily Gonzella Lex, MD   160 mg at 01/27/16 0939  . glucagon (human recombinant) (GLUCAGEN) injection 1 mg  1 mg Intramuscular Once PRN Gonzella Lex, MD      . haloperidol lactate (HALDOL) injection 20 mg  20 mg Intramuscular Q6H PRN Gonzella Lex, MD      . insulin glargine (LANTUS) injection 30 Units  30 Units Subcutaneous QHS Gonzella Lex, MD   30 Units at 01/26/16 2236  . lamoTRIgine (LAMICTAL) tablet 200 mg  200 mg Oral Daily Gonzella Lex, MD   200 mg at 01/27/16 0939  . lithium carbonate capsule 900 mg  900 mg Oral QHS Gonzella Lex, MD   900 mg at 01/26/16 2235  . LORazepam (ATIVAN) injection 2 mg  2 mg Intramuscular Q6H PRN Gonzella Lex, MD      . LORazepam (ATIVAN) tablet 1 mg  1 mg Oral Q4H PRN Rainey [place], MD   1 mg at 01/17/16 0545  . magnesium hydroxide (MILK OF MAGNESIA) suspension 30 mL  30 mL Oral Daily PRN Gonzella Lex, MD      . midazolam (VERSED) injection 2 mg  2 mg Intravenous Once Gonzella Lex, MD      . multivitamin with minerals tablet 1 tablet  1 tablet Oral Daily Gonzella Lex, MD   1 tablet at 01/27/16 0940  . QUEtiapine (SEROQUEL) tablet 1,400 mg  1,400 mg Oral QHS Gonzella Lex, MD   1,400 mg at 01/26/16 2235  . temazepam (RESTORIL) capsule 15 mg  15 mg Oral QHS Gonzella Lex, MD   15 mg at 01/26/16 2235  . warfarin (COUMADIN) tablet 7.5 mg  7.5 mg Oral q1800 Gonzella Lex, MD   7.5 mg at 01/26/16 1719  . Warfarin - Pharmacist Dosing Inpatient   Does not apply q1800 Gonzella Lex, MD        Lab Results:  Results for orders placed or performed during the hospital encounter of 01/04/16 (from the past 48 hour(s))  Glucose, capillary     Status: Abnormal   Collection Time: 01/25/16  4:09 PM  Result Value Ref Range   Glucose-Capillary 123 (H) 65 - 99 mg/dL   Comment 1 Notify RN   Glucose, capillary     Status: Abnormal   Collection Time: 01/25/16  8:26 PM  Result Value Ref Range   Glucose-Capillary 106 (H) 65 - 99 mg/dL  Glucose, capillary      Status: Abnormal   Collection Time: 01/25/16 11:12 PM  Result Value Ref Range   Glucose-Capillary 180 (H) 65 - 99 mg/dL  Glucose, capillary     Status: None   Collection Time: 01/26/16  6:58 AM  Result Value Ref Range  Glucose-Capillary 96 65 - 99 mg/dL   Comment 1 Notify RN   Protime-INR     Status: Abnormal   Collection Time: 01/26/16  7:27 AM  Result Value Ref Range   Prothrombin Time 26.5 (H) 11.4 - 15.2 seconds   INR 2.39   Glucose, capillary     Status: Abnormal   Collection Time: 01/26/16 11:40 AM  Result Value Ref Range   Glucose-Capillary 155 (H) 65 - 99 mg/dL   Comment 1 Notify RN    Comment 2 Document in Chart   Glucose, capillary     Status: Abnormal   Collection Time: 01/26/16  4:37 PM  Result Value Ref Range   Glucose-Capillary 100 (H) 65 - 99 mg/dL   Comment 1 Notify RN   Glucose, capillary     Status: Abnormal   Collection Time: 01/26/16  8:31 PM  Result Value Ref Range   Glucose-Capillary 105 (H) 65 - 99 mg/dL  Glucose, capillary     Status: None   Collection Time: 01/27/16  6:46 AM  Result Value Ref Range   Glucose-Capillary 66 65 - 99 mg/dL  Glucose, capillary     Status: None   Collection Time: 01/27/16  7:03 AM  Result Value Ref Range   Glucose-Capillary 76 65 - 99 mg/dL  Protime-INR     Status: Abnormal   Collection Time: 01/27/16  9:08 AM  Result Value Ref Range   Prothrombin Time 24.2 (H) 11.4 - 15.2 seconds   INR 2.13   Lithium level     Status: None   Collection Time: 01/27/16  9:08 AM  Result Value Ref Range   Lithium Lvl 1.00 0.60 - 1.20 mmol/L  CBC     Status: Abnormal   Collection Time: 01/27/16  9:08 AM  Result Value Ref Range   WBC 2.8 (L) 3.8 - 10.6 K/uL   RBC 4.27 (L) 4.40 - 5.90 MIL/uL   Hemoglobin 12.2 (L) 13.0 - 18.0 g/dL   HCT 36.0 (L) 40.0 - 52.0 %   MCV 84.3 80.0 - 100.0 fL   MCH 28.5 26.0 - 34.0 pg   MCHC 33.8 32.0 - 36.0 g/dL   RDW 13.2 11.5 - 14.5 %   Platelets 197 150 - 440 K/uL  Glucose, capillary     Status:  Abnormal   Collection Time: 01/27/16 12:05 PM  Result Value Ref Range   Glucose-Capillary 124 (H) 65 - 99 mg/dL   Comment 1 Notify RN    Comment 2 Document in Chart     Blood Alcohol level:  Lab Results  Component Value Date   ETH <5 20/25/4270    Metabolic Disorder Labs: Lab Results  Component Value Date   HGBA1C 7.4 (H) 01/02/2016   MPG 192 12/04/2015   MPG 318 (H) 08/24/2010   Lab Results  Component Value Date   PROLACTIN 4.4 12/11/2015   Lab Results  Component Value Date   CHOL 143 12/11/2015   TRIG 286 (H) 12/11/2015   HDL 32 (L) 12/11/2015   CHOLHDL 4.5 12/11/2015   VLDL 57 (H) 12/11/2015   LDLCALC 54 12/11/2015   LDLCALC 56 12/05/2015    Physical Findings: AIMS: Facial and Oral Movements Muscles of Facial Expression: None, normal Lips and Perioral Area: None, normal Jaw: None, normal Tongue: None, normal,Extremity Movements Upper (arms, wrists, hands, fingers): None, normal Lower (legs, knees, ankles, toes): None, normal, Trunk Movements Neck, shoulders, hips: None, normal, Overall Severity Severity of abnormal movements (highest score from questions above):  None, normal Incapacitation due to abnormal movements: None, normal Patient's awareness of abnormal movements (rate only patient's report): No Awareness, Dental Status Current problems with teeth and/or dentures?: No Does patient usually wear dentures?: No  CIWA:    COWS:     Musculoskeletal: Strength & Muscle Tone: within normal limits Gait & Station: normal Patient leans: N/A  Psychiatric Specialty Exam: Physical Exam  Nursing note and vitals reviewed. Constitutional: He appears well-developed and well-nourished.  HENT:  Head: Normocephalic and atraumatic.  Eyes: EOM are normal.  Neck: Normal range of motion.  Respiratory: Effort normal. No respiratory distress.  Musculoskeletal: Normal range of motion.  Neurological: He is alert.  Skin:       Review of Systems  Constitutional:  Negative.  Negative for chills, fever, malaise/fatigue and weight loss.  HENT: Negative.  Negative for hearing loss, nosebleeds, sore throat and tinnitus.   Eyes: Negative.  Negative for blurred vision and double vision.  Respiratory: Negative.  Negative for cough, hemoptysis, sputum production, shortness of breath and wheezing.   Cardiovascular: Negative.  Negative for chest pain, palpitations and claudication.  Gastrointestinal: Negative.  Negative for abdominal pain, blood in stool, constipation, diarrhea, heartburn, nausea and vomiting.  Genitourinary: Negative for dysuria, frequency and urgency.  Musculoskeletal: Negative.  Negative for back pain, falls, joint pain, myalgias and neck pain.  Skin: Negative.  Negative for itching and rash.  Neurological: Negative.  Negative for dizziness, tingling, tremors, focal weakness, seizures and headaches.  Endo/Heme/Allergies: Negative for environmental allergies. Does not bruise/bleed easily.  Psychiatric/Behavioral: Negative for depression, hallucinations, memory loss, substance abuse and suicidal ideas. The patient is not nervous/anxious and does not have insomnia.     Blood pressure 111/73, pulse 69, temperature 98.5 F (36.9 C), resp. rate 18, height 6' (1.829 m), weight 127.9 kg (282 lb), SpO2 100 %.Body mass index is 38.25 kg/m.  General Appearance: Disheveled  Eye Contact:  Fair  Speech:  Pressured  Volume:  Increased  Mood:  Euphoric  Affect:  Labile  Thought Process:  Disorganized  Orientation:  Full (Time, Place, and Person)  Thought Content:  Illogical, Delusions, Rumination and Tangential  Suicidal Thoughts:  No  Homicidal Thoughts:  No  Memory:  Immediate;   Fair Recent;   Poor Remote;   Fair  Judgement:  Impaired  Insight:  Shallow  Psychomotor Activity:  Increased  Concentration:  Concentration: Poor  Recall:  [place] of Knowledge:  Good  Language:  Good  Akathisia:  No  Handed:  Right  AIMS (if indicated):       Assets:  Financial Resources/Insurance Housing Social Support  ADL's:  Impaired  Cognition:  Impaired,  Mild  Sleep:  Number of Hours: 6.45     Treatment Plan Summary:  I will check lithium level for tomorrow. No change to medicine. I am tentatively putting him on the schedule for ECT on Monday although he really does look quite a bit better. I promised him that I will not try to do treatment if I don't think it's going to be helpful for him. He is tentatively agreeable. No other change to plan for now.  Hyperlipidemia: We'll plan to continue Lipitor 40 mg by mouth daily and fenofibrate 160 mg by mouth daily  Cellulitis of the leg: Will continue Augmentin 1 tablet by mouth twice a day. Cellulitis is slowly improving.  History of pulmonary embolism: We'll continue Coumadin 8 mg by mouth nightly.  Disposition: The patient will be discharged home with his  wife because he does have a stable living situation. Psychotropic medication management follow-up appointment as well as individual therapy will beScheduled with Dr. Thurmond Butts.  01/27/2016 No medication changes. Improving  Patient has been complaining of feeling lightheaded I will assess staff to be check vital signs  .Hildred Priest, MD 01/27/2016, 1:09 PM

## 2016-01-27 NOTE — Progress Notes (Signed)
Hypoglycemic Event  CBG: 66  Treatment: Apple juice 4oz and graham crackers  Symptoms: none  Follow-up CBG: Time 0705 CBG Result: 76  Possible Reasons for Event: Evening insulin dose Comments/MD notified: Oncoming shift notified.     Amie [place]

## 2016-01-27 NOTE — BHH Group Notes (Signed)
[facility] Group Notes:  (Nursing/MHT/Case Management/Adjunct)  Date:  01/27/2016  Time:  5:19 PM  Type of Therapy:  Psychoeducational Skills  Participation Level:  Did Not Attend   Primitivo Gauze 01/27/2016, 5:19 PM

## 2016-01-27 NOTE — Progress Notes (Signed)
ANTICOAGULATION CONSULT NOTE - Follow Up Consult  Pharmacy Consult for Warfarin Indication: VTE treatment  Allergies  Allergen Reactions  . Asa [Aspirin] Other (See Comments)    Patient tolerates LOW DOSE ASPIRIN.    Patient Measurements: Height: 6' (182.9 cm) Weight: 282 lb (127.9 kg) IBW/kg (Calculated) : 77.6  Vital Signs: Temp: 98.5 F (36.9 C) (08/26 0700) BP: 111/73 (08/26 0700) Pulse Rate: 69 (08/26 0700)  Labs:  Recent Labs  01/25/16 1010 01/26/16 0727 01/27/16 0908  HGB  --   --  12.2*  HCT  --   --  36.0*  PLT  --   --  197  LABPROT 27.6* 26.5* 24.2*  INR 2.51 2.39 2.13   CrCl cannot be calculated (Patient's most recent lab result is older than the maximum 21 days allowed.).  Medical History: Past Medical History:  Diagnosis Date  . Bipolar 1 disorder ([place])   . CKD (chronic kidney disease), stage III   . Diabetes mellitus without complication ([place])   . DVT (deep venous thrombosis) ([place])   . Hypercholesteremia   . Hypertension    Medications:  Warfarin   Assessment: 79 yom admitted to ED B[facility] with manic symptoms. Recently discharged from [facility] with DVT/PE, had been bridging LMWH and VKA prior to admission.  INR History: 7/9: INR - 1.99 7/10: no INR- warfarin 12.5 mg po daily at 0200 7/11: INR - 2.97- warfarin held 7/12: INR - 1.92 - warfarin 10 mg 7/13: INR - 1.95 - warfarin 10 mg 7/14: INR - 2.56 - warfarin 5 mg 7/15: INR - 2.73 - warfarin 5 mg 7/16: INR - 2.39 - warfarin 7.5 mg 7/17: INR - 2.22 - warfarin 7.5 mg 7/18: INR - 2.53 - warfarin 7.5 mg 7/19: INR - 2.75 - warfarin 7.5 mg 7/20: INR - 2.99 - warfarin 6.5 mg 7/21: INR - 2.64 - warfarin 6.5 mg  7/22: INR: 2.44; warfarin 8  7/23: INR: 2.60;  Warfarin 10mg  7/24: INR 2.44 - warfarin 10mg  7/25:  INR 3.22 - warfarin 10mg  7/26:  INR 3.08 - warfarin 8mg  7/27:  INR 3.24 - warfarin 8 mg 7/28:  INR 2.79 - 8 mg 7/29:  INR 2.74- 8 mg 7/30:  INR 3.19 - 7 mg 7/31:  INR 2.73  7.5mg  8/1:     INR 3.13  7.5mg  8/2:    INR 2.74  7.5mg  8/3     INR 2.27  7.5mg  8/4     INR 3.19  8 mg 8/5     INR 3.04  7.5 mg 8/6     INR 3.05  7.5mg  8/7:    INR  3.22  6mg  8/8:    INR  3.85  Dose held per protocol 8/9:    INR 2.4     6mg  8/10:  INR 1.93   7mg   8/11:  INR  2.00  7mg  8/12:  INR  2.26  6 mg 8/13:  INR 2.22   7 mg 8/14:  INR 2.18   7mg  8/15:  INR 2.42   NONE given 8/16:  INR 1.77  7.5mg   8/17:  INR 1.43  ? Not given 8/18:  INR Not done  Warfarin 7.5 mg 8/19: INR: 1.84; 7.5 mg  8/20: INR: 1.98  7.5mg  8/21:  INR 2.09  8 mg 8/22:  INR  2.27 8 mg 8/23:  INR  2.42  7.5mg  8/24:  INR  2.51  7.5mg  8/25:  INR  2.39  7.5 mg  Goal of Therapy:  INR 2-3 Monitor platelets by anticoagulation protocol: Yes   Plan:  Will continue warfarin to 7.5 mg PO daily.   (Patient with orders for carbamazepine which interacts with warfarin to decrease the INR. Patient has orders for fenofibrate which interacts with warfarin to increase the INR. Will follow INR closely.)   Pt will need CBC q3 days per policy.  Currently INR ordered daily.  (f/u for potential to change to Q2day INR checks)  Pharmacy will continue to follow.   Pike Scantlebury A, RPH 01/27/2016,9:59 AM

## 2016-01-27 NOTE — BHH Group Notes (Signed)
[name]:  (Nursing/MHT/Case Management/Adjunct)  Date:  01/27/2016  Time:  9:51 PM  Type of Therapy:  Group Therapy  Participation Level:  Active  Participation Quality:  Appropriate  Affect:  Appropriate  Cognitive:  Appropriate  Insight:  Good  Engagement in Group:  Supportive  Modes of Intervention:  Support  Summary of Progress/Problems:  Cameron [name] 01/27/2016, 9:51 PM

## 2016-01-28 LAB — GLUCOSE, CAPILLARY
GLUCOSE-CAPILLARY: 87 mg/dL (ref 65–99)
GLUCOSE-CAPILLARY: 128 mg/dL — AB (ref 65–99)
Glucose-Capillary: 144 mg/dL — ABNORMAL HIGH (ref 65–99)
Glucose-Capillary: 166 mg/dL — ABNORMAL HIGH (ref 65–99)

## 2016-01-28 LAB — PROTIME-INR
INR: 2.24
PROTHROMBIN TIME: 25.2 s — AB (ref 11.4–15.2)

## 2016-01-28 NOTE — Plan of Care (Signed)
Problem: Activity: Goal: Sleeping patterns will improve Outcome: Progressing Pt has been sleeping for extended periods of time in the evening without interruption.

## 2016-01-28 NOTE — Plan of Care (Signed)
Problem: Education: Goal: Knowledge of the prescribed therapeutic regimen will improve Outcome: Progressing Patient increasingly cooperative with treatment plan. Stating he will have ECT tomorrow.

## 2016-01-28 NOTE — Progress Notes (Signed)
D: Patient is alert and oriented on the unit this shift. Patient attended and actively participated in groups today. Patient denies suicidal ideation, homicidal ideation, auditory or visual hallucinations at the present time.  A: Scheduled medications are administered to patient as per MD orders. Emotional support and encouragement are provided. Patient is maintained on q.15 minute safety checks. Patient is informed to notify staff with questions or concerns. R: No adverse medication reactions are noted. Patient is cooperative with medication administration and treatment plan today. Patient is receptive ,anxious and cooperative on the unit at this time. Patient interacts l with others on the unit this shift. Patient contracts for safety at this time. Patient remains safe at this time.

## 2016-01-28 NOTE — BHH Group Notes (Signed)
[facility] Group Notes:  (Nursing/MHT/Case Management/Adjunct)  Date:  01/28/2016  Time:  11:05 PM  Type of Therapy:  Evening Wrap-up Group  Participation Level:  Active  Participation Quality:  Appropriate  Affect:  Appropriate  Cognitive:  Alert and Appropriate  Insight:  Appropriate, Good and Improving  Engagement in Group:  Developing/Improving and Engaged  Modes of Intervention:  Activity  Summary of Progress/Problems:  Levonne Spiller 01/28/2016, 11:05 PM

## 2016-01-28 NOTE — BHH Group Notes (Signed)
[facility] LCSW Group Therapy  01/28/2016 2:35 PM  Type of Therapy:  Group Therapy  Participation Level:  Active  Participation Quality:  Appropriate, Attentive, Sharing and Supportive  Affect:  Appropriate and Tearful  Cognitive:  Appropriate  Insight:  Developing/Improving  Engagement in Therapy:  Engaged and Supportive  Modes of Intervention:  Activity, Discussion, Reality Testing and Support  Summary of Progress/Problems:Emotional Regulation: Patients will identify both negative and positive emotions. They will discuss emotions they have difficulty regulating and how they impact their lives. Patients will be asked to identify healthy coping skills to combat unhealthy reactions to negative emotions. Pt became tearful during group because his outpatient psychiatrist came to  See him yesterday. He stated that his insight is improving and he is willing to continue ECT treatment so that he can discharge soon. CSW provided encouragement and support to patient for sharing.    Florean Hoobler G. [place], [place] 01/28/2016, 2:37 PM

## 2016-01-28 NOTE — Progress Notes (Signed)
Patient with sad affect, cooperative behavior with meals, meds and plan of care. No SI/HI at this time. Patient with no s/s hypo/hyperglycemia. Patient verbalizes understanding rt recommended ECT scheduled for tomorrow. Patient pleasant and mood and behavior increasingly appropriate. Safety maintained.

## 2016-01-28 NOTE — Progress Notes (Signed)
[facility] MD Progress Note  01/28/2016 11:39 AM Cameron [name]  MRN:  [id number] Subjective:  Patient was calm, pleasant and cooperative. He was appropriate during interaction. He was redirectable, Thought process was linear. He denies having SI or HI or hallucinations. He has been is sleeping well. No physical complaints or side effects from medications were reported today.  Patient has been participated in all groups but has not been disruptive.  Per nursing: D:  Denies SI/HI/AVH.  Patient is pleasant and cooperative.  Visible in the milieu, interacting appropriately.   A:  Support and encouragement offered.  Scheduled medications administered according to orders. R:  Receptive to treatment regiment.  Safety maintained.   Principal Problem: Bipolar I disorder, current or most recent episode manic, with psychotic features ([place])   Diagnosis:   Patient Active Problem List   Diagnosis Date Noted  . Bipolar affective disorder, current episode manic ([place]) [F31.9] 01/04/2016  . Cellulitis of leg, right [L03.115] 01/01/2016  . Bipolar I disorder, current or most recent episode manic, with psychotic features ([place]) [F31.2] 12/11/2015  . Pulmonary embolism ([place]) [I26.99] 12/04/2015  . Diabetes mellitus without complication ([place]) [id number]   . Hypercholesteremia [E78.00]   . CKD (chronic kidney disease), stage III [N18.3]   . Acute deep vein thrombosis (DVT) of femoral vein of right lower extremity (HCC) [I82.411]    Total Time spent with patient: 20 minutes  Past Psychiatric History: Long history of bipolar disorder multiple manic episodes. History of response to ECT.  Past Medical History:  Past Medical History:  Diagnosis Date  . Bipolar 1 disorder ([place])   . CKD (chronic kidney disease), stage III   . Diabetes mellitus without complication ([place])   . DVT (deep venous thrombosis) ([place])   . Hypercholesteremia   . Hypertension     Past Surgical History:  Procedure Laterality Date  .  APPENDECTOMY     Family History:  Family History  Problem Relation Age of Onset  . Stroke Other   . Diabetes Other    Family Psychiatric  History: Positive for bipolar disorder Social History:  History  Alcohol Use  . Yes    Comment: rarely     History  Drug Use No    Social History   Social History  . Marital status: Married    Spouse name: N/A  . Number of children: N/A  . Years of education: N/A   Social History Main Topics  . Smoking status: Never Smoker  . Smokeless tobacco: Never Used  . Alcohol use Yes     Comment: rarely  . Drug use: No  . Sexual activity: Not Asked   Other Topics Concern  . None   Social History Narrative  . None   Current Medications: Current Facility-Administered Medications  Medication Dose Route Frequency Provider Last Rate Last Dose  . acetaminophen (TYLENOL) tablet 650 mg  650 mg Oral Q6H PRN Gonzella Lex, MD   650 mg at 01/17/16 0536  . alum & mag hydroxide-simeth (MAALOX/MYLANTA) 200-200-20 MG/5ML suspension 30 mL  30 mL Oral Q4H PRN Gonzella Lex, MD      . atorvastatin (LIPITOR) tablet 40 mg  40 mg Oral q1800 Gonzella Lex, MD   40 mg at 01/27/16 1731  . carbamazepine (TEGRETOL) tablet 1,200 mg  1,200 mg Oral BID PC Gonzella Lex, MD   1,200 mg at 01/28/16 0852  . dextrose 5 % solution 250 mL  250 mL Intravenous Once [employer],  MD      . enalapril (VASOTEC) tablet 5 mg  5 mg Oral BID Gonzella Lex, MD   5 mg at 01/28/16 0853  . fenofibrate tablet 160 mg  160 mg Oral Daily Gonzella Lex, MD   160 mg at 01/28/16 0852  . glucagon (human recombinant) (GLUCAGEN) injection 1 mg  1 mg Intramuscular Once PRN Gonzella Lex, MD      . haloperidol lactate (HALDOL) injection 20 mg  20 mg Intramuscular Q6H PRN Gonzella Lex, MD      . insulin glargine (LANTUS) injection 30 Units  30 Units Subcutaneous QHS Gonzella Lex, MD   30 Units at 01/27/16 2144  . lamoTRIgine (LAMICTAL) tablet 200 mg  200 mg Oral Daily Gonzella Lex, MD    200 mg at 01/28/16 0852  . lithium carbonate capsule 900 mg  900 mg Oral QHS Gonzella Lex, MD   900 mg at 01/27/16 2144  . LORazepam (ATIVAN) injection 2 mg  2 mg Intramuscular Q6H PRN Gonzella Lex, MD      . LORazepam (ATIVAN) tablet 1 mg  1 mg Oral Q4H PRN Rainey P[place], MD   1 mg at 01/17/16 0545  . magnesium hydroxide (MILK OF MAGNESIA) suspension 30 mL  30 mL Oral Daily PRN Gonzella Lex, MD      . midazolam (VERSED) injection 2 mg  2 mg Intravenous Once Gonzella Lex, MD      . multivitamin with minerals tablet 1 tablet  1 tablet Oral Daily Gonzella Lex, MD   1 tablet at 01/28/16 [phone]  . QUEtiapine (SEROQUEL) tablet 1,400 mg  1,400 mg Oral QHS Gonzella Lex, MD   1,400 mg at 01/27/16 2145  . temazepam (RESTORIL) capsule 15 mg  15 mg Oral QHS Gonzella Lex, MD   15 mg at 01/27/16 2144  . warfarin (COUMADIN) tablet 7.5 mg  7.5 mg Oral q1800 Gonzella Lex, MD   7.5 mg at 01/27/16 1731  . Warfarin - Pharmacist Dosing Inpatient   Does not apply q1800 Gonzella Lex, MD        Lab Results:  Results for orders placed or performed during the hospital encounter of 01/04/16 (from the past 48 hour(s))  Glucose, capillary     Status: Abnormal   Collection Time: 01/26/16 11:40 AM  Result Value Ref Range   Glucose-Capillary 155 (H) 65 - 99 mg/dL   Comment 1 Notify RN    Comment 2 Document in Chart   Glucose, capillary     Status: Abnormal   Collection Time: 01/26/16  4:37 PM  Result Value Ref Range   Glucose-Capillary 100 (H) 65 - 99 mg/dL   Comment 1 Notify RN   Glucose, capillary     Status: Abnormal   Collection Time: 01/26/16  8:31 PM  Result Value Ref Range   Glucose-Capillary 105 (H) 65 - 99 mg/dL  Glucose, capillary     Status: None   Collection Time: 01/27/16  6:46 AM  Result Value Ref Range   Glucose-Capillary 66 65 - 99 mg/dL  Glucose, capillary     Status: None   Collection Time: 01/27/16  7:03 AM  Result Value Ref Range   Glucose-Capillary 76 65 - 99 mg/dL    Protime-INR     Status: Abnormal   Collection Time: 01/27/16  9:08 AM  Result Value Ref Range   Prothrombin Time 24.2 (H) 11.4 - 15.2 seconds  INR 2.13   Lithium level     Status: None   Collection Time: 01/27/16  9:08 AM  Result Value Ref Range   Lithium Lvl 1.00 0.60 - 1.20 mmol/L  CBC     Status: Abnormal   Collection Time: 01/27/16  9:08 AM  Result Value Ref Range   WBC 2.8 (L) 3.8 - 10.6 K/uL   RBC 4.27 (L) 4.40 - 5.90 MIL/uL   Hemoglobin 12.2 (L) 13.0 - 18.0 g/dL   HCT 36.0 (L) 40.0 - 52.0 %   MCV 84.3 80.0 - 100.0 fL   MCH 28.5 26.0 - 34.0 pg   MCHC 33.8 32.0 - 36.0 g/dL   RDW 13.2 11.5 - 14.5 %   Platelets 197 150 - 440 K/uL  Glucose, capillary     Status: Abnormal   Collection Time: 01/27/16 12:05 PM  Result Value Ref Range   Glucose-Capillary 124 (H) 65 - 99 mg/dL   Comment 1 Notify RN    Comment 2 Document in Chart   Glucose, capillary     Status: Abnormal   Collection Time: 01/27/16  2:04 PM  Result Value Ref Range   Glucose-Capillary 147 (H) 65 - 99 mg/dL   Comment 1 Notify RN   Glucose, capillary     Status: Abnormal   Collection Time: 01/27/16  4:32 PM  Result Value Ref Range   Glucose-Capillary 150 (H) 65 - 99 mg/dL   Comment 1 Notify RN   Glucose, capillary     Status: Abnormal   Collection Time: 01/27/16  8:24 PM  Result Value Ref Range   Glucose-Capillary 122 (H) 65 - 99 mg/dL  Glucose, capillary     Status: None   Collection Time: 01/28/16  6:34 AM  Result Value Ref Range   Glucose-Capillary 87 65 - 99 mg/dL  Protime-INR     Status: Abnormal   Collection Time: 01/28/16  7:04 AM  Result Value Ref Range   Prothrombin Time 25.2 (H) 11.4 - 15.2 seconds   INR 2.24     Blood Alcohol level:  Lab Results  Component Value Date   ETH <5 [id number]    Metabolic Disorder Labs: Lab Results  Component Value Date   HGBA1C 7.4 (H) 01/02/2016   MPG 192 12/04/2015   MPG 318 (H) 08/24/2010   Lab Results  Component Value Date   PROLACTIN 4.4  12/11/2015   Lab Results  Component Value Date   CHOL 143 12/11/2015   TRIG 286 (H) 12/11/2015   HDL 32 (L) 12/11/2015   CHOLHDL 4.5 12/11/2015   VLDL 57 (H) 12/11/2015   LDLCALC 54 12/11/2015   LDLCALC 56 12/05/2015    Physical Findings: AIMS: Facial and Oral Movements Muscles of Facial Expression: None, normal Lips and Perioral Area: None, normal Jaw: None, normal Tongue: None, normal,Extremity Movements Upper (arms, wrists, hands, fingers): None, normal Lower (legs, knees, ankles, toes): None, normal, Trunk Movements Neck, shoulders, hips: None, normal, Overall Severity Severity of abnormal movements (highest score from questions above): None, normal Incapacitation due to abnormal movements: None, normal Patient's awareness of abnormal movements (rate only patient's report): No Awareness, Dental Status Current problems with teeth and/or dentures?: No Does patient usually wear dentures?: No  CIWA:    COWS:     Musculoskeletal: Strength & Muscle Tone: within normal limits Gait & Station: normal Patient leans: N/A  Psychiatric Specialty Exam: Physical Exam  Nursing note and vitals reviewed. Constitutional: He appears well-developed and well-nourished.  HENT:  Head: Normocephalic  and atraumatic.  Eyes: EOM are normal.  Neck: Normal range of motion.  Musculoskeletal: Normal range of motion.  Neurological: He is alert.  Skin:       Review of Systems  Constitutional: Negative.  Negative for chills, fever, malaise/fatigue and weight loss.  HENT: Negative.  Negative for hearing loss, nosebleeds, sore throat and tinnitus.   Eyes: Negative.  Negative for blurred vision and double vision.  Respiratory: Negative.  Negative for cough, hemoptysis, sputum production, shortness of breath and wheezing.   Cardiovascular: Negative.  Negative for chest pain, palpitations and claudication.  Gastrointestinal: Negative.  Negative for abdominal pain, blood in stool, constipation,  diarrhea, heartburn, nausea and vomiting.  Genitourinary: Negative for dysuria, frequency and urgency.  Musculoskeletal: Negative.  Negative for back pain, falls, joint pain, myalgias and neck pain.  Skin: Negative.  Negative for itching and rash.  Neurological: Negative.  Negative for dizziness, tingling, tremors, focal weakness, seizures and headaches.  Endo/Heme/Allergies: Negative for environmental allergies. Does not bruise/bleed easily.  Psychiatric/Behavioral: Negative for depression, hallucinations, memory loss, substance abuse and suicidal ideas. The patient is not nervous/anxious and does not have insomnia.     Blood pressure 124/69, pulse 76, temperature 98.4 F (36.9 C), temperature source Oral, resp. rate 20, height 6' (1.829 m), weight 127.9 kg (282 lb), SpO2 100 %.Body mass index is 38.25 kg/m.  General Appearance: Disheveled  Eye Contact:  Fair  Speech:  Pressured  Volume:  Increased  Mood:  Euphoric  Affect:  Labile  Thought Process:  Disorganized  Orientation:  Full (Time, Place, and Person)  Thought Content:  Illogical, Delusions, Rumination and Tangential  Suicidal Thoughts:  No  Homicidal Thoughts:  No  Memory:  Immediate;   Fair Recent;   Poor Remote;   Fair  Judgement:  Impaired  Insight:  Shallow  Psychomotor Activity:  Increased  Concentration:  Concentration: Poor  Recall:  Gateway of Knowledge:  Good  Language:  Good  Akathisia:  No  Handed:  Right  AIMS (if indicated):     Assets:  Financial Resources/Insurance Housing Social Support  ADL's:  Impaired  Cognition:  Impaired,  Mild  Sleep:  Number of Hours: 7     Treatment Plan Summary:  I will check lithium level for tomorrow. No change to medicine. I am tentatively putting him on the schedule for ECT on Monday although he really does look quite a bit better. I promised him that I will not try to do treatment if I don't think it's going to be helpful for him. He is tentatively agreeable. No  other change to plan for now.  Hyperlipidemia: We'll plan to continue Lipitor 40 mg by mouth daily and fenofibrate 160 mg by mouth daily  Cellulitis of the leg: Will continue Augmentin 1 tablet by mouth twice a day. Cellulitis is slowly improving.  History of pulmonary embolism: We'll continue Coumadin 8 mg by mouth nightly.  Disposition: The patient will be discharged home with his wife because he does have a stable living situation. Psychotropic medication management follow-up appointment as well as individual therapy will beScheduled with Dr. Thurmond Butts.  01/27/2016 No medication changes. Improving  Patient has been complaining of feeling lightheaded I will assess staff to be check vital signs----VS and CBG was wnl  01/28/16 Patient denies any side effects or physical complaints today. He says he is doing very well. No changes will be made.  Lithium level was 1  .Hildred Priest, MD 01/28/2016, 11:39 AM

## 2016-01-28 NOTE — Progress Notes (Signed)
ANTICOAGULATION CONSULT NOTE - Follow Up Consult  Pharmacy Consult for Warfarin Indication: VTE treatment  Allergies  Allergen Reactions  . Asa [Aspirin] Other (See Comments)    Patient tolerates LOW DOSE ASPIRIN.    Patient Measurements: Height: 6' (182.9 cm) Weight: 282 lb (127.9 kg) IBW/kg (Calculated) : 77.6  Vital Signs:    Labs:  Recent Labs  01/26/16 0727 01/27/16 0908 01/28/16 0704  HGB  --  12.2*  --   HCT  --  36.0*  --   PLT  --  197  --   LABPROT 26.5* 24.2* 25.2*  INR 2.39 2.13 2.24   CrCl cannot be calculated (Patient's most recent lab result is older than the maximum 21 days allowed.).  Medical History: Past Medical History:  Diagnosis Date  . Bipolar 1 disorder ([place])   . CKD (chronic kidney disease), stage III   . Diabetes mellitus without complication ([place])   . DVT (deep venous thrombosis) ([place])   . Hypercholesteremia   . Hypertension    Medications:  Warfarin   Assessment: 42 yom admitted to ED BHU with manic symptoms. Recently discharged from [facility] with DVT/PE, had been bridging LMWH and VKA prior to admission.  INR History: 7/9: INR - 1.99 7/10: no INR- warfarin 12.5 mg po daily at 0200 7/11: INR - 2.97- warfarin held 7/12: INR - 1.92 - warfarin 10 mg 7/13: INR - 1.95 - warfarin 10 mg 7/14: INR - 2.56 - warfarin 5 mg 7/15: INR - 2.73 - warfarin 5 mg 7/16: INR - 2.39 - warfarin 7.5 mg 7/17: INR - 2.22 - warfarin 7.5 mg 7/18: INR - 2.53 - warfarin 7.5 mg 7/19: INR - 2.75 - warfarin 7.5 mg 7/20: INR - 2.99 - warfarin 6.5 mg 7/21: INR - 2.64 - warfarin 6.5 mg  7/22: INR: 2.44; warfarin 8  7/23: INR: 2.60;  Warfarin 10mg  7/24: INR 2.44 - warfarin 10mg  7/25:  INR 3.22 - warfarin 10mg  7/26:  INR 3.08 - warfarin 8mg  7/27:  INR 3.24 - warfarin 8 mg 7/28:  INR 2.79 - 8 mg 7/29:  INR 2.74- 8 mg 7/30:  INR 3.19 - 7 mg 7/31:  INR 2.73  7.5mg  8/1:    INR 3.13  7.5mg  8/2:    INR 2.74  7.5mg  8/3     INR 2.27  7.5mg  8/4     INR 3.19  8  mg 8/5     INR 3.04  7.5 mg 8/6     INR 3.05  7.5mg  8/7:    INR  3.22  6mg  8/8:    INR  3.85  Dose held per protocol 8/9:    INR 2.4     6mg  8/10:  INR 1.93   7mg   8/11:  INR  2.00  7mg  8/12:  INR  2.26  6 mg 8/13:  INR 2.22   7 mg 8/14:  INR 2.18   7mg  8/15:  INR 2.42   NONE given 8/16:  INR 1.77  7.5mg   8/17:  INR 1.43  ? Not given 8/18:  INR Not done  Warfarin 7.5 mg 8/19: INR: 1.84;  7.5 mg  8/20: INR: 1.98   7.5mg  8/21:  INR 2.09   8 mg 8/22:  INR  2.27  8 mg 8/23:  INR  2.42  7.5mg  8/24:  INR  2.51  7.5mg  8/25:  INR  2.39  7.5 mg 8/26:  INR  2.13  7.5 mg 8/27:  INR  2.24  Goal  of Therapy:  INR 2-3 Monitor platelets by anticoagulation protocol: Yes   Plan:  Will continue warfarin to 7.5 mg PO daily.  If INR therapeutic tomorrow (8/28) (within 0.2 of todays INR), would consider change to Q2day INR checks per protocol)  (Patient with orders for carbamazepine which interacts with warfarin to decrease the INR. Patient has orders for fenofibrate which interacts with warfarin to increase the INR. Will follow INR closely.)   Pt will need CBC q3 days per policy.  Currently INR ordered daily.    Trinita Devlin A, RPH 01/28/2016,9:45 AM

## 2016-01-28 NOTE — Plan of Care (Signed)
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

## 2016-01-28 NOTE — Progress Notes (Signed)
Pt denies SI/HI/AVH. Visited with Wife and daughter this evening. Visible in milieu with appropriate interaction among peers and staff. Compliant with evening medications. Noted not to be pacing the unit. No intrusive behaviors noted. States he is agreeable to ECT on Monday. Attended evening group. Denies pain. No somatic complaints. Voices no additional concerns. Safety maintained.

## 2016-01-29 ENCOUNTER — Encounter: Payer: Self-pay | Admitting: *Deleted

## 2016-01-29 ENCOUNTER — Other Ambulatory Visit: Payer: Self-pay | Admitting: Psychiatry

## 2016-01-29 ENCOUNTER — Inpatient Hospital Stay: Payer: 59 | Admitting: Anesthesiology

## 2016-01-29 LAB — PROTIME-INR
INR: 2.08
Prothrombin Time: 23.7 seconds — ABNORMAL HIGH (ref 11.4–15.2)

## 2016-01-29 LAB — GLUCOSE, CAPILLARY
GLUCOSE-CAPILLARY: 112 mg/dL — AB (ref 65–99)
GLUCOSE-CAPILLARY: 108 mg/dL — AB (ref 65–99)
GLUCOSE-CAPILLARY: 125 mg/dL — AB (ref 65–99)
GLUCOSE-CAPILLARY: 177 mg/dL — AB (ref 65–99)

## 2016-01-29 MED ORDER — SODIUM CHLORIDE 0.9 % IV SOLN
INTRAVENOUS | Status: DC | PRN
  Administered 2016-01-29: 10:00:00 via INTRAVENOUS

## 2016-01-29 MED ORDER — DEXTROSE 5 % IV SOLN: 500.0000 mL | Freq: Once | INTRAVENOUS | Status: DC

## 2016-01-29 MED ORDER — SUCCINYLCHOLINE CHLORIDE 20 MG/ML IJ SOLN
INTRAMUSCULAR | Status: DC | PRN
  Administered 2016-01-29: 150 mg via INTRAVENOUS

## 2016-01-29 MED ORDER — MIDAZOLAM HCL 2 MG/2ML IJ SOLN
INTRAMUSCULAR | Status: DC | PRN
  Administered 2016-01-29: 2 mg via INTRAVENOUS

## 2016-01-29 MED ORDER — KETOROLAC TROMETHAMINE 30 MG/ML IJ SOLN
30.0000 mg | Freq: Once | INTRAMUSCULAR | Status: AC
  Administered 2016-01-29: 30 mg via INTRAVENOUS

## 2016-01-29 MED ORDER — KETAMINE HCL 10 MG/ML IJ SOLN
INTRAMUSCULAR | Status: DC | PRN
  Administered 2016-01-29: 100 mg via INTRAVENOUS

## 2016-01-29 MED ORDER — KETOROLAC TROMETHAMINE 30 MG/ML IJ SOLN
INTRAMUSCULAR | Status: AC
  Filled 2016-01-29: qty 1

## 2016-01-29 MED ORDER — SODIUM CHLORIDE 0.9 % IV SOLN
Freq: Once | INTRAVENOUS | Status: AC
  Administered 2016-01-29: 09:00:00 via INTRAVENOUS

## 2016-01-29 NOTE — Procedures (Signed)
ECT SERVICES Physician's Interval Evaluation & Treatment Note  Patient Identification: KEYLAN HOPE MRN:  [id number] Date of Evaluation:  01/29/2016 TX #: [id number]  MADRS:   MMSE:   P.E. Findings:  No change to physical exam. Vital signs stable. Legs are healed up very well. Lungs and heart normal.  Psychiatric Interval Note:  Mood is subjectively good. Affect is calm her. Thought still a little bit scattered but not psychotic.  Subjective:  Patient is a 50 y.o. male seen for evaluation for Electroconvulsive Therapy. Feeling well.  Treatment Summary:   []   Right Unilateral             [x]  Bilateral   % Energy : 1.0 ms 100%   Impedance: 1260 ohms  Seizure Energy Index: 2129 microvolts squared  Postictal Suppression Index: 80%    Seizure Concordance Index: 96%  Medications  Pre Shock: Toradol 30 mg ketamine 100 mg succinylcholine 150 mg Post Shock: Versed 2 mg  Seizure Duration: 30 seconds by motor movement 37 seconds by EEG   Comments: Follow-up Wednesday as needed depending on clinical response   Lungs:  [x]   Clear to auscultation               []  Other:   Heart:    [x]   Regular rhythm             []  irregular rhythm    [x]   Previous H&P reviewed, patient examined and there are NO CHANGES                 []   Previous H&P reviewed, patient examined and there are changes noted.   Alethia Berthold, MD 8/28/201710:21 AM

## 2016-01-29 NOTE — Progress Notes (Signed)
D: Cameron [name] and cooperative. Reports being sleepy this am. Cameron had ECT this am, tolerated well. Cameron logical and coherent in conversation.  A: Encouragement and support offered. Encouraged patient to rest and attend group when feeling better. Cameron receptive and remains safe on unit with q 15 min checks.

## 2016-01-29 NOTE — Progress Notes (Signed)
Recreation Therapy Notes  Date: 08.28.17 Time: 10:10 am Location: Craft Room  Group Topic: Self-expression  Goal Area(s) Addresses:  Patient will be able to identify a color that represents each emotion. Patient will verbalize benefit of using art as a means of self-expression. Patient will verbalize one emotion experienced while participating in activity.  Behavioral Response: Did not attend  Intervention: The Colors Within Me  Activity: Patients were given blank face worksheets and instructed to pick a color for each emotion they were feeling and show on the face how much of that emotion they were feeling.  Education: LRT educated patients on other forms of self-expression.  Education Outcome: Patient did not attend group.  Clinical Observations/Feedback: Patient did not attend group.  [name], LRT/CTRS 01/29/2016 10:51 AM

## 2016-01-29 NOTE — Plan of Care (Signed)
Problem: Education: Goal: Mental status will improve Outcome: Progressing Pt logical /coherent in conversation

## 2016-01-29 NOTE — Transfer of Care (Signed)
Immediate Anesthesia Transfer of Care Note  Patient: Cameron [name]  Procedure(s) Performed: * No procedures listed *  Patient Location: PACU  Anesthesia Type:General  Level of Consciousness: awake and alert   Airway & Oxygen Therapy: Patient Spontanous Breathing and Patient connected to face mask oxygen  Post-op Assessment: Report given to RN and Post -op Vital signs reviewed and stable  Post vital signs: Reviewed and stable  Last Vitals:  Vitals:   01/29/16 0656 01/29/16 0847  BP: 127/81 128/82  Pulse: 80 82  Resp: 20 18  Temp: 36.7 C 36.6 C    Last Pain:  Vitals:   01/29/16 0847  TempSrc: Oral  PainSc:       Patients Stated Pain Goal: 0 ([id number])  Complications: No apparent anesthesia complications

## 2016-01-29 NOTE — Progress Notes (Signed)
ANTICOAGULATION CONSULT NOTE - Follow Up Consult  Pharmacy Consult for Warfarin Indication: VTE treatment  Allergies  Allergen Reactions  . Asa [Aspirin] Other (See Comments)    Patient tolerates LOW DOSE ASPIRIN.    Patient Measurements: Height: 6' (182.9 cm) Weight: 276 lb (125.2 kg) IBW/kg (Calculated) : 77.6  Vital Signs: Temp: 97.9 F (36.6 C) (08/28 0847) Temp Source: Oral (08/28 0847) BP: 128/82 (08/28 0847) Pulse Rate: 82 (08/28 0847)  Labs:  Recent Labs  01/27/16 0908 01/28/16 0704 01/29/16 0706  HGB 12.2*  --   --   HCT 36.0*  --   --   PLT 197  --   --   LABPROT 24.2* 25.2* 23.7*  INR 2.13 2.24 2.08   CrCl cannot be calculated (Patient's most recent lab result is older than the maximum 21 days allowed.).  Medical History: Past Medical History:  Diagnosis Date  . Bipolar 1 disorder ([place])   . CKD (chronic kidney disease), stage III   . Diabetes mellitus without complication ([place])   . DVT (deep venous thrombosis) ([place])   . Hypercholesteremia   . Hypertension    Medications:  Warfarin   Assessment: 24 yom admitted to ED BHU with manic symptoms. Recently discharged from [facility] with DVT/PE, had been bridging LMWH and VKA prior to admission.  INR History: 7/9: INR - 1.99 7/10: no INR- warfarin 12.5 mg po daily at 0200 7/11: INR - 2.97- warfarin held 7/12: INR - 1.92 - warfarin 10 mg 7/13: INR - 1.95 - warfarin 10 mg 7/14: INR - 2.56 - warfarin 5 mg 7/15: INR - 2.73 - warfarin 5 mg 7/16: INR - 2.39 - warfarin 7.5 mg 7/17: INR - 2.22 - warfarin 7.5 mg 7/18: INR - 2.53 - warfarin 7.5 mg 7/19: INR - 2.75 - warfarin 7.5 mg 7/20: INR - 2.99 - warfarin 6.5 mg 7/21: INR - 2.64 - warfarin 6.5 mg  7/22: INR: 2.44; warfarin 8  7/23: INR: 2.60;  Warfarin 10mg  7/24: INR 2.44 - warfarin 10mg  7/25:  INR 3.22 - warfarin 10mg  7/26:  INR 3.08 - warfarin 8mg  7/27:  INR 3.24 - warfarin 8 mg 7/28:  INR 2.79 - 8 mg 7/29:  INR 2.74- 8 mg 7/30:  INR 3.19 - 7  mg 7/31:  INR 2.73  7.5mg  8/1:    INR 3.13  7.5mg  8/2:    INR 2.74  7.5mg  8/3     INR 2.27  7.5mg  8/4     INR 3.19  8 mg 8/5     INR 3.04  7.5 mg 8/6     INR 3.05  7.5mg  8/7:    INR  3.22  6mg  8/8:    INR  3.85  Dose held per protocol 8/9:    INR 2.4     6mg  8/10:  INR 1.93   7mg   8/11:  INR  2.00  7mg  8/12:  INR  2.26  6 mg 8/13:  INR 2.22   7 mg 8/14:  INR 2.18   7mg  8/15:  INR 2.42   NONE given 8/16:  INR 1.77  7.5mg   8/17:  INR 1.43  ? Not given 8/18:  INR Not done  Warfarin 7.5 mg 8/19: INR: 1.84;  7.5 mg  8/20: INR: 1.98   7.5mg  8/21:  INR 2.09   8 mg 8/22:  INR  2.27  8 mg 8/23:  INR  2.42  7.5mg  8/24:  INR  2.51  7.5mg  8/25:  INR  2.39  7.5 mg 8/26:  INR  2.13  7.5 mg 8/27:  INR  2.24  7.5mg  8/28:  INR  2.08  Goal of Therapy:  INR 2-3 Monitor platelets by anticoagulation protocol: Yes   Plan:  Will continue warfarin to 7.5 mg PO daily.  If INR therapeutic tomorrow (8/28) (within 0.2 of todays INR), would consider change to Q2day INR checks per protocol)  (Patient with orders for carbamazepine which interacts with warfarin to decrease the INR. Patient has orders for fenofibrate which interacts with warfarin to increase the INR. Will follow INR closely.)   Pt will need CBC q3 days per policy.  Will change INR to q2 days   Christinamarie Tall K, RPH 01/29/2016,10:40 AM

## 2016-01-29 NOTE — [facility] Group Notes (Signed)
[facility] Group Notes:  (Nursing/MHT/Case Management/Adjunct)  Date:  01/29/2016  Time:  9:47 PM  Type of Therapy:  Group Therapy  Participation Level:  Active  Participation Quality:  Appropriate  Affect:  Appropriate  Cognitive:  Appropriate  Insight:  Appropriate and Improving  Engagement in Group:  Developing/Improving and Engaged  Modes of Intervention:  Discussion  Summary of Progress/Problems: Pt stated that his goal was to continue to get better since his treatment today was a success. Staff was able to follow pt's thoughts clearly   Marylynn Pearson 01/29/2016, 9:47 PM

## 2016-01-29 NOTE — Anesthesia Preprocedure Evaluation (Signed)
Anesthesia Evaluation  Patient identified by MRN, date of birth, ID band Patient awake    Reviewed: Allergy & Precautions, H&P , NPO status , Patient's Chart, lab work & pertinent test results, reviewed documented beta blocker date and time   Airway Mallampati: III   Neck ROM: full    Dental  (+) Teeth Intact   Pulmonary neg pulmonary ROS,    Pulmonary exam normal        Cardiovascular hypertension, + Peripheral Vascular Disease  negative cardio ROS Normal cardiovascular exam Rhythm:regular Rate:Normal     Neuro/Psych PSYCHIATRIC DISORDERS negative neurological ROS  negative psych ROS   GI/Hepatic negative GI ROS, Neg liver ROS,   Endo/Other  negative endocrine ROSdiabetes  Renal/GU CRFRenal diseasenegative Renal ROS  negative genitourinary   Musculoskeletal   Abdominal   Peds  Hematology negative hematology ROS (+)   Anesthesia Other Findings Past Medical History: No date: Bipolar 1 disorder (HCC) No date: CKD (chronic kidney disease), stage III No date: Diabetes mellitus without complication (HCC) No date: DVT (deep venous thrombosis) (HCC) No date: Hypercholesteremia No date: Hypertension Past Surgical History: No date: APPENDECTOMY BMI    Body Mass Index:  37.43 kg/m     Reproductive/Obstetrics                             Anesthesia Physical Anesthesia Plan  ASA: III  Anesthesia Plan: General   Post-op Pain Management:    Induction:   Airway Management Planned:   Additional Equipment:   Intra-op Plan:   Post-operative Plan:   Informed Consent: I have reviewed the patients History and Physical, chart, labs and discussed the procedure including the risks, benefits and alternatives for the proposed anesthesia with the patient or authorized representative who has indicated his/her understanding and acceptance.   Dental Advisory Given  Plan Discussed with:  CRNA  Anesthesia Plan Comments:         Anesthesia Quick Evaluation

## 2016-01-29 NOTE — Anesthesia Postprocedure Evaluation (Signed)
Anesthesia Post Note  Patient: Cameron [name]  Procedure(s) Performed: * No procedures listed *  Patient location during evaluation: PACU Anesthesia Type: General Level of consciousness: awake and alert Pain management: pain level controlled Vital Signs Assessment: post-procedure vital signs reviewed and stable Respiratory status: spontaneous breathing, nonlabored ventilation, respiratory function stable and patient connected to nasal cannula oxygen Cardiovascular status: blood pressure returned to baseline and stable Postop Assessment: no signs of nausea or vomiting Anesthetic complications: no    Last Vitals:  Vitals:   01/29/16 1102 01/29/16 1112  BP: 137/87 133/76  Pulse: (!) 101 95  Resp: (!) 25 (!) 21  Temp:      Last Pain:  Vitals:   01/29/16 0847  TempSrc: Oral  PainSc:                  [name]

## 2016-01-29 NOTE — Progress Notes (Signed)
[facility] MD Progress Note  01/29/2016 8:19 PM Cameron [name]  MRN:  [id number] Subjective:  Patient was calm, pleasant and cooperative. He was appropriate during interaction. He was redirectable, Thought process was linear. He denies having SI or HI or hallucinations. He has been is sleeping well. No physical complaints or side effects from medications were reported today.  Patient has been participated in all groups but has not been disruptive.  Follow-up for Monday the 28th. Patient continues to show improvement. Able to stay on topic pretty well. No bizarre speech. Only a little bit euphoric. Cooperative and pleasant for the most part.  Per nursing: D:  Denies SI/HI/AVH.  Patient is pleasant and cooperative.  Visible in the milieu, interacting appropriately.   A:  Support and encouragement offered.  Scheduled medications administered according to orders. R:  Receptive to treatment regiment.  Safety maintained.   Principal Problem: Bipolar I disorder, current or most recent episode manic, with psychotic features ([place])   Diagnosis:   Patient Active Problem List   Diagnosis Date Noted  . Bipolar affective disorder, current episode manic ([place]) [F31.9] 01/04/2016  . Cellulitis of leg, right [L03.115] 01/01/2016  . Bipolar I disorder, current or most recent episode manic, with psychotic features ([place]) [F31.2] 12/11/2015  . Pulmonary embolism ([place]) [I26.99] 12/04/2015  . Diabetes mellitus without complication ([place]) [id number]   . Hypercholesteremia [E78.00]   . CKD (chronic kidney disease), stage III [N18.3]   . Acute deep vein thrombosis (DVT) of femoral vein of right lower extremity (HCC) [I82.411]    Total Time spent with patient: 20 minutes  Past Psychiatric History: Long history of bipolar disorder multiple manic episodes. History of response to ECT.  Past Medical History:  Past Medical History:  Diagnosis Date  . Bipolar 1 disorder ([place])   . CKD (chronic kidney disease), stage III   .  Diabetes mellitus without complication ([place])   . DVT (deep venous thrombosis) ([place])   . Hypercholesteremia   . Hypertension     Past Surgical History:  Procedure Laterality Date  . APPENDECTOMY     Family History:  Family History  Problem Relation Age of Onset  . Stroke Other   . Diabetes Other    Family Psychiatric  History: Positive for bipolar disorder Social History:  History  Alcohol Use  . Yes    Comment: rarely     History  Drug Use No    Social History   Social History  . Marital status: Married    Spouse name: N/A  . Number of children: N/A  . Years of education: N/A   Social History Main Topics  . Smoking status: Never Smoker  . Smokeless tobacco: Never Used  . Alcohol use Yes     Comment: rarely  . Drug use: No  . Sexual activity: Not Asked   Other Topics Concern  . None   Social History Narrative  . None   Current Medications: Current Facility-Administered Medications  Medication Dose Route Frequency Provider Last Rate Last Dose  . acetaminophen (TYLENOL) tablet 650 mg  650 mg Oral Q6H PRN Gonzella Lex, MD   650 mg at 01/17/16 0536  . alum & mag hydroxide-simeth (MAALOX/MYLANTA) 200-200-20 MG/5ML suspension 30 mL  30 mL Oral Q4H PRN Gonzella Lex, MD      . atorvastatin (LIPITOR) tablet 40 mg  40 mg Oral q1800 Gonzella Lex, MD   40 mg at 01/29/16 1658  . carbamazepine (TEGRETOL) tablet 1,200 mg  1,200 mg Oral BID PC Gonzella Lex, MD   1,200 mg at 01/29/16 1658  . dextrose 5 % solution 250 mL  250 mL Intravenous Once Gonzella Lex, MD      . enalapril (VASOTEC) tablet 5 mg  5 mg Oral BID Gonzella Lex, MD   5 mg at 01/29/16 0843  . fenofibrate tablet 160 mg  160 mg Oral Daily Gonzella Lex, MD   160 mg at 01/29/16 1150  . glucagon (human recombinant) (GLUCAGEN) injection 1 mg  1 mg Intramuscular Once PRN Gonzella Lex, MD      . haloperidol lactate (HALDOL) injection 20 mg  20 mg Intramuscular Q6H PRN Gonzella Lex, MD      . insulin  glargine (LANTUS) injection 30 Units  30 Units Subcutaneous QHS Gonzella Lex, MD   30 Units at 01/28/16 2130  . lamoTRIgine (LAMICTAL) tablet 200 mg  200 mg Oral Daily Gonzella Lex, MD   200 mg at 01/29/16 1149  . lithium carbonate capsule 900 mg  900 mg Oral QHS Gonzella Lex, MD   900 mg at 01/28/16 2150  . LORazepam (ATIVAN) injection 2 mg  2 mg Intramuscular Q6H PRN Gonzella Lex, MD      . LORazepam (ATIVAN) tablet 1 mg  1 mg Oral Q4H PRN Rainey Pines, MD   1 mg at 01/17/16 0545  . magnesium hydroxide (MILK OF MAGNESIA) suspension 30 mL  30 mL Oral Daily PRN Gonzella Lex, MD      . midazolam (VERSED) injection 2 mg  2 mg Intravenous Once Gonzella Lex, MD      . multivitamin with minerals tablet 1 tablet  1 tablet Oral Daily Gonzella Lex, MD   1 tablet at 01/29/16 1150  . QUEtiapine (SEROQUEL) tablet 1,400 mg  1,400 mg Oral QHS Gonzella Lex, MD   1,400 mg at 01/28/16 2151  . temazepam (RESTORIL) capsule 15 mg  15 mg Oral QHS Gonzella Lex, MD   15 mg at 01/28/16 2151  . warfarin (COUMADIN) tablet 7.5 mg  7.5 mg Oral q1800 Gonzella Lex, MD   7.5 mg at 01/29/16 1657  . Warfarin - Pharmacist Dosing Inpatient   Does not apply q1800 Gonzella Lex, MD        Lab Results:  Results for orders placed or performed during the hospital encounter of 01/04/16 (from the past 48 hour(s))  Glucose, capillary     Status: Abnormal   Collection Time: 01/27/16  8:24 PM  Result Value Ref Range   Glucose-Capillary 122 (H) 65 - 99 mg/dL  Glucose, capillary     Status: None   Collection Time: 01/28/16  6:34 AM  Result Value Ref Range   Glucose-Capillary 87 65 - 99 mg/dL  Protime-INR     Status: Abnormal   Collection Time: 01/28/16  7:04 AM  Result Value Ref Range   Prothrombin Time 25.2 (H) 11.4 - 15.2 seconds   INR 2.24   Glucose, capillary     Status: Abnormal   Collection Time: 01/28/16 11:46 AM  Result Value Ref Range   Glucose-Capillary 128 (H) 65 - 99 mg/dL   Comment 1 Notify RN     Glucose, capillary     Status: Abnormal   Collection Time: 01/28/16  4:25 PM  Result Value Ref Range   Glucose-Capillary 144 (H) 65 - 99 mg/dL   Comment 1 Notify RN    Comment  2 Document in Chart   Glucose, capillary     Status: Abnormal   Collection Time: 01/28/16  9:55 PM  Result Value Ref Range   Glucose-Capillary 166 (H) 65 - 99 mg/dL  Glucose, capillary     Status: Abnormal   Collection Time: 01/29/16  6:39 AM  Result Value Ref Range   Glucose-Capillary 112 (H) 65 - 99 mg/dL  Protime-INR     Status: Abnormal   Collection Time: 01/29/16  7:06 AM  Result Value Ref Range   Prothrombin Time 23.7 (H) 11.4 - 15.2 seconds   INR 2.08   Glucose, capillary     Status: Abnormal   Collection Time: 01/29/16 11:52 AM  Result Value Ref Range   Glucose-Capillary 108 (H) 65 - 99 mg/dL  Glucose, capillary     Status: Abnormal   Collection Time: 01/29/16  4:38 PM  Result Value Ref Range   Glucose-Capillary 125 (H) 65 - 99 mg/dL    Blood Alcohol level:  Lab Results  Component Value Date   ETH <5 [id number]    Metabolic Disorder Labs: Lab Results  Component Value Date   HGBA1C 7.4 (H) 01/02/2016   MPG 192 12/04/2015   MPG 318 (H) 08/24/2010   Lab Results  Component Value Date   PROLACTIN 4.4 12/11/2015   Lab Results  Component Value Date   CHOL 143 12/11/2015   TRIG 286 (H) 12/11/2015   HDL 32 (L) 12/11/2015   CHOLHDL 4.5 12/11/2015   VLDL 57 (H) 12/11/2015   LDLCALC 54 12/11/2015   LDLCALC 56 12/05/2015    Physical Findings: AIMS: Facial and Oral Movements Muscles of Facial Expression: None, normal Lips and Perioral Area: None, normal Jaw: None, normal Tongue: None, normal,Extremity Movements Upper (arms, wrists, hands, fingers): None, normal Lower (legs, knees, ankles, toes): None, normal, Trunk Movements Neck, shoulders, hips: None, normal, Overall Severity Severity of abnormal movements (highest score from questions above): None, normal Incapacitation due to  abnormal movements: None, normal Patient's awareness of abnormal movements (rate only patient's report): No Awareness, Dental Status Current problems with teeth and/or dentures?: No Does patient usually wear dentures?: No  CIWA:    COWS:     Musculoskeletal: Strength & Muscle Tone: within normal limits Gait & Station: normal Patient leans: N/A  Psychiatric Specialty Exam: Physical Exam  Nursing note and vitals reviewed. Constitutional: He appears well-developed and well-nourished.  HENT:  Head: Normocephalic and atraumatic.  Eyes: EOM are normal.  Neck: Normal range of motion.  Musculoskeletal: Normal range of motion.  Neurological: He is alert.  Skin:       Review of Systems  Constitutional: Negative.  Negative for chills, fever, malaise/fatigue and weight loss.  HENT: Negative.  Negative for hearing loss, nosebleeds, sore throat and tinnitus.   Eyes: Negative.  Negative for blurred vision and double vision.  Respiratory: Negative.  Negative for cough, hemoptysis, sputum production, shortness of breath and wheezing.   Cardiovascular: Negative.  Negative for chest pain, palpitations and claudication.  Gastrointestinal: Negative.  Negative for abdominal pain, blood in stool, constipation, diarrhea, heartburn, nausea and vomiting.  Genitourinary: Negative for dysuria, frequency and urgency.  Musculoskeletal: Negative.  Negative for back pain, falls, joint pain, myalgias and neck pain.  Skin: Negative.  Negative for itching and rash.  Neurological: Negative.  Negative for dizziness, tingling, tremors, focal weakness, seizures and headaches.  Endo/Heme/Allergies: Negative for environmental allergies. Does not bruise/bleed easily.  Psychiatric/Behavioral: Negative for depression, hallucinations, memory loss, substance abuse and suicidal ideas.  The patient is not nervous/anxious and does not have insomnia.     Blood pressure 133/76, pulse 95, temperature 98.8 F (37.1 C), resp.  rate (!) 21, height 6' (1.829 m), weight 125.2 kg (276 lb), SpO2 91 %.Body mass index is 37.43 kg/m.  General Appearance: Disheveled  Eye Contact:  Fair  Speech:  Pressured  Volume:  Increased  Mood:  Euphoric  Affect:  Labile  Thought Process:  Disorganized  Orientation:  Full (Time, Place, and Person)  Thought Content:  Illogical, Delusions, Rumination and Tangential  Suicidal Thoughts:  No  Homicidal Thoughts:  No  Memory:  Immediate;   Fair Recent;   Poor Remote;   Fair  Judgement:  Impaired  Insight:  Shallow  Psychomotor Activity:  Increased  Concentration:  Concentration: Poor  Recall:  [place] of Knowledge:  Good  Language:  Good  Akathisia:  No  Handed:  Right  AIMS (if indicated):     Assets:  Financial Resources/Insurance Housing Social Support  ADL's:  Impaired  Cognition:  Impaired,  Mild  Sleep:  Number of Hours: 6.3     Treatment Plan Summary:  I will check lithium level for tomorrow. No change to medicine. I am tentatively putting him on the schedule for ECT on Monday although he really does look quite a bit better. I promised him that I will not try to do treatment if I don't think it's going to be helpful for him. He is tentatively agreeable. No other change to plan for now.  Hyperlipidemia: We'll plan to continue Lipitor 40 mg by mouth daily and fenofibrate 160 mg by mouth daily  Cellulitis of the leg: Will continue Augmentin 1 tablet by mouth twice a day. Cellulitis is slowly improving.  History of pulmonary embolism: We'll continue Coumadin 8 mg by mouth nightly.  Disposition: The patient will be discharged home with his wife because he does have a stable living situation. Psychotropic medication management follow-up appointment as well as individual therapy will beScheduled with Dr. Thurmond Butts.  01/27/2016 No medication changes. Improving  Patient has been complaining of feeling lightheaded I will assess staff to be check vital signs----VS and CBG  was wnl  01/28/16 Patient denies any side effects or physical complaints today. He says he is doing very well. No changes will be made.  Lithium level was 1  Lithium level as noted was 1.0. Not showing any side effects from medicine. Clearly much improved. He had ECT today which was tolerated well without any difficulty. We are going to follow things day by day. He is tentatively on the schedule for ECT Wednesday but I know that we don't want to push things too far and cause unnecessary side effects. Patient continues to be agreeable to the plan.  Alethia Berthold, MD 01/29/2016, 8:19 PM

## 2016-01-29 NOTE — H&P (Signed)
Cameron [name] is an 50 y.o. male.   Chief Complaint: Bipolar disorder. No specific complaint. Feeling better. HPI: Bipolar disorder extended hospitalization recovering from mania. Clearly feeling better daily.  Past Medical History:  Diagnosis Date  . Bipolar 1 disorder ([place])   . CKD (chronic kidney disease), stage III   . Diabetes mellitus without complication ([place])   . DVT (deep venous thrombosis) ([place])   . Hypercholesteremia   . Hypertension     Past Surgical History:  Procedure Laterality Date  . APPENDECTOMY      Family History  Problem Relation Age of Onset  . Stroke Other   . Diabetes Other    Social History:  reports that he has never smoked. He has never used smokeless tobacco. He reports that he drinks alcohol. He reports that he does not use drugs.  Allergies:  Allergies  Allergen Reactions  . Asa [Aspirin] Other (See Comments)    Patient tolerates LOW DOSE ASPIRIN.    Medications Prior to Admission  Medication Sig Dispense Refill  . amoxicillin-clavulanate (AUGMENTIN) 875-125 MG tablet Take 1 tablet by mouth 2 (two) times daily. X 7 more days 14 tablet 0  . ARIPiprazole (ABILIFY) 30 MG tablet Take 30 mg by mouth every evening.     Marland Kitchen atorvastatin (LIPITOR) 40 MG tablet Take 40 mg by mouth daily.  11  . carbamazepine (TEGRETOL) 200 MG tablet Take 2 tablets (400 mg total) by mouth every morning. 30 tablet 0  . carbamazepine (TEGRETOL) 200 MG tablet Take 3 tablets (600 mg total) by mouth every evening. 30 tablet 0  . enalapril (VASOTEC) 5 MG tablet Take 5 mg by mouth 2 (two) times daily.    . fenofibrate 160 MG tablet Take 160 mg by mouth daily.    . insulin aspart (NOVOLOG) 100 UNIT/ML injection Inject 0-9 Units into the skin 3 (three) times daily with meals. 10 mL 11  . insulin aspart (NOVOLOG) 100 UNIT/ML injection Inject 0-5 Units into the skin at bedtime. 10 mL 11  . Insulin Glargine (LANTUS SOLOSTAR) 100 UNIT/ML Solostar Pen Inject 60 Units into the skin  daily.     Marland Kitchen lamoTRIgine (LAMICTAL) 100 MG tablet Take 400 mg by mouth at bedtime.    . Multiple Vitamin (MULTIVITAMIN WITH MINERALS) TABS tablet Take 1 tablet by mouth daily.    . QUEtiapine (SEROQUEL) 400 MG tablet Take 3 tablets (1,200 mg total) by mouth at bedtime. 90 tablet 0  . TANZEUM 50 MG PEN Inject 50 mg into the skin every 7 (seven) days.  2  . temazepam (RESTORIL) 15 MG capsule Take 15 mg by mouth at bedtime as needed for sleep.    Marland Kitchen warfarin (COUMADIN) 7.5 MG tablet Take 1 tablet (7.5 mg total) by mouth daily at 6 PM. 30 tablet 0    Results for orders placed or performed during the hospital encounter of 01/04/16 (from the past 48 hour(s))  Glucose, capillary     Status: Abnormal   Collection Time: 01/27/16 12:05 PM  Result Value Ref Range   Glucose-Capillary 124 (H) 65 - 99 mg/dL   Comment 1 Notify RN    Comment 2 Document in Chart   Glucose, capillary     Status: Abnormal   Collection Time: 01/27/16  2:04 PM  Result Value Ref Range   Glucose-Capillary 147 (H) 65 - 99 mg/dL   Comment 1 Notify RN   Glucose, capillary     Status: Abnormal   Collection Time: 01/27/16  4:32 PM  Result Value Ref Range   Glucose-Capillary 150 (H) 65 - 99 mg/dL   Comment 1 Notify RN   Glucose, capillary     Status: Abnormal   Collection Time: 01/27/16  8:24 PM  Result Value Ref Range   Glucose-Capillary 122 (H) 65 - 99 mg/dL  Glucose, capillary     Status: None   Collection Time: 01/28/16  6:34 AM  Result Value Ref Range   Glucose-Capillary 87 65 - 99 mg/dL  Protime-INR     Status: Abnormal   Collection Time: 01/28/16  7:04 AM  Result Value Ref Range   Prothrombin Time 25.2 (H) 11.4 - 15.2 seconds   INR 2.24   Glucose, capillary     Status: Abnormal   Collection Time: 01/28/16 11:46 AM  Result Value Ref Range   Glucose-Capillary 128 (H) 65 - 99 mg/dL   Comment 1 Notify RN   Glucose, capillary     Status: Abnormal   Collection Time: 01/28/16  4:25 PM  Result Value Ref Range    Glucose-Capillary 144 (H) 65 - 99 mg/dL   Comment 1 Notify RN    Comment 2 Document in Chart   Glucose, capillary     Status: Abnormal   Collection Time: 01/28/16  9:55 PM  Result Value Ref Range   Glucose-Capillary 166 (H) 65 - 99 mg/dL  Glucose, capillary     Status: Abnormal   Collection Time: 01/29/16  6:39 AM  Result Value Ref Range   Glucose-Capillary 112 (H) 65 - 99 mg/dL  Protime-INR     Status: Abnormal   Collection Time: 01/29/16  7:06 AM  Result Value Ref Range   Prothrombin Time 23.7 (H) 11.4 - 15.2 seconds   INR 2.08    No results found.  Review of Systems  Constitutional: Negative.   HENT: Negative.   Eyes: Negative.   Respiratory: Negative.   Cardiovascular: Negative.   Gastrointestinal: Negative.   Musculoskeletal: Negative.   Skin: Negative.   Neurological: Negative.   Psychiatric/Behavioral: Negative for depression, hallucinations, memory loss, substance abuse and suicidal ideas. The patient is not nervous/anxious and does not have insomnia.     Blood pressure 128/82, pulse 82, temperature 97.9 F (36.6 C), temperature source Oral, resp. rate 18, height 6' (1.829 m), weight 125.2 kg (276 lb), SpO2 100 %. Physical Exam  Nursing note and vitals reviewed. Constitutional: He appears well-developed and well-nourished.  HENT:  Head: Normocephalic and atraumatic.  Eyes: Conjunctivae are normal. Pupils are equal, round, and reactive to light.  Neck: Normal range of motion.  Cardiovascular: Regular rhythm and normal heart sounds.   Respiratory: Effort normal. No respiratory distress.  GI: Soft.  Musculoskeletal: Normal range of motion.  Neurological: He is alert.  Skin: Skin is warm and dry.  Psychiatric: He has a normal mood and affect. His behavior is normal. Judgment normal. His speech is tangential. Cognition and memory are normal.     Assessment/Plan Treatment today and then Monday Wednesday and Friday as needed depending on recovery.  Alethia Berthold,  MD 01/29/2016, 10:18 AM

## 2016-01-29 NOTE — Anesthesia Procedure Notes (Signed)
Performed by: Mindy Behnken Pre-anesthesia Checklist: Patient identified, Patient being monitored, Timeout performed, Emergency Drugs available and Suction available Patient Re-evaluated:Patient Re-evaluated prior to inductionOxygen Delivery Method: Circle system utilized Preoxygenation: Pre-oxygenation with 100% oxygen Ventilation: Mask ventilation without difficulty Airway Equipment and Method: Bite block Dental Injury: Teeth and Oropharynx as per pre-operative assessment        

## 2016-01-30 LAB — GLUCOSE, CAPILLARY
GLUCOSE-CAPILLARY: 95 mg/dL (ref 65–99)
Glucose-Capillary: 106 mg/dL — ABNORMAL HIGH (ref 65–99)
Glucose-Capillary: 92 mg/dL (ref 65–99)
Glucose-Capillary: 109 mg/dL — ABNORMAL HIGH (ref 65–99)

## 2016-01-30 NOTE — Plan of Care (Signed)
Problem: Education: Goal: Verbalization of understanding the information provided will improve Outcome: Progressing Patient actively participating in plan of care.

## 2016-01-30 NOTE — Progress Notes (Signed)
[facility] MD Progress Note  01/30/2016 7:38 PM SHAMMOND PASSON  MRN:  [id number] Subjective:  Patient was calm, pleasant and cooperative. He was appropriate during interaction. He was redirectable, Thought process was linear. He denies having SI or HI or hallucinations. He has been is sleeping well. No physical complaints or side effects from medications were reported today.  Patient has been participated in all groups but has not been disruptive.  Follow-up for Tuesday the 29th. Patient seen. Chart reviewed. Patient was remarkably even better than he was yesterday. Very calm and pleasant in interaction. His speech was of a normal rate and tone. His thoughts were lucid with no sign of any disorganized odd or grandiose thinking. He shows insight into this and reflects that he is pleased that he is no longer talking quickly or feeling the need to constantly be in motion. Really at this point does not appear to have any obvious outward signs of mania anymore. Per nursing: D:  Denies SI/HI/AVH.  Patient is pleasant and cooperative.  Visible in the milieu, interacting appropriately.   A:  Support and encouragement offered.  Scheduled medications administered according to orders. R:  Receptive to treatment regiment.  Safety maintained.   Principal Problem: Bipolar I disorder, current or most recent episode manic, with psychotic features ([place])   Diagnosis:   Patient Active Problem List   Diagnosis Date Noted  . Bipolar affective disorder, current episode manic ([place]) [F31.9] 01/04/2016  . Cellulitis of leg, right [L03.115] 01/01/2016  . Bipolar I disorder, current or most recent episode manic, with psychotic features ([place]) [F31.2] 12/11/2015  . Pulmonary embolism ([place]) [I26.99] 12/04/2015  . Diabetes mellitus without complication ([place]) [id number]   . Hypercholesteremia [E78.00]   . CKD (chronic kidney disease), stage III [N18.3]   . Acute deep vein thrombosis (DVT) of femoral vein of right lower extremity  (HCC) [I82.411]    Total Time spent with patient: 20 minutes  Past Psychiatric History: Long history of bipolar disorder multiple manic episodes. History of response to ECT.  Past Medical History:  Past Medical History:  Diagnosis Date  . Bipolar 1 disorder ([place])   . CKD (chronic kidney disease), stage III   . Diabetes mellitus without complication ([place])   . DVT (deep venous thrombosis) ([place])   . Hypercholesteremia   . Hypertension     Past Surgical History:  Procedure Laterality Date  . APPENDECTOMY     Family History:  Family History  Problem Relation Age of Onset  . Stroke Other   . Diabetes Other    Family Psychiatric  History: Positive for bipolar disorder Social History:  History  Alcohol Use  . Yes    Comment: rarely     History  Drug Use No    Social History   Social History  . Marital status: Married    Spouse name: N/A  . Number of children: N/A  . Years of education: N/A   Social History Main Topics  . Smoking status: Never Smoker  . Smokeless tobacco: Never Used  . Alcohol use Yes     Comment: rarely  . Drug use: No  . Sexual activity: Not Asked   Other Topics Concern  . None   Social History Narrative  . None   Current Medications: Current Facility-Administered Medications  Medication Dose Route Frequency Provider Last Rate Last Dose  . acetaminophen (TYLENOL) tablet 650 mg  650 mg Oral Q6H PRN Gonzella Lex, MD   650 mg at 01/17/16  [place]  . alum & mag hydroxide-simeth (MAALOX/MYLANTA) 200-200-20 MG/5ML suspension 30 mL  30 mL Oral Q4H PRN Gonzella Lex, MD      . atorvastatin (LIPITOR) tablet 40 mg  40 mg Oral q1800 Gonzella Lex, MD   40 mg at 01/30/16 1653  . carbamazepine (TEGRETOL) tablet 1,200 mg  1,200 mg Oral BID PC Gonzella Lex, MD   1,200 mg at 01/30/16 1652  . dextrose 5 % solution 250 mL  250 mL Intravenous Once Gonzella Lex, MD      . enalapril (VASOTEC) tablet 5 mg  5 mg Oral BID Gonzella Lex, MD   5 mg at 01/30/16  0912  . fenofibrate tablet 160 mg  160 mg Oral Daily Gonzella Lex, MD   160 mg at 01/30/16 0912  . glucagon (human recombinant) (GLUCAGEN) injection 1 mg  1 mg Intramuscular Once PRN Gonzella Lex, MD      . haloperidol lactate (HALDOL) injection 20 mg  20 mg Intramuscular Q6H PRN Gonzella Lex, MD      . insulin glargine (LANTUS) injection 30 Units  30 Units Subcutaneous QHS Gonzella Lex, MD   30 Units at 01/29/16 2158  . lamoTRIgine (LAMICTAL) tablet 200 mg  200 mg Oral Daily Gonzella Lex, MD   200 mg at 01/30/16 0913  . lithium carbonate capsule 900 mg  900 mg Oral QHS Gonzella Lex, MD   900 mg at 01/29/16 2152  . LORazepam (ATIVAN) injection 2 mg  2 mg Intramuscular Q6H PRN Gonzella Lex, MD      . LORazepam (ATIVAN) tablet 1 mg  1 mg Oral Q4H PRN Rainey Pines, MD   1 mg at 01/17/16 0545  . magnesium hydroxide (MILK OF MAGNESIA) suspension 30 mL  30 mL Oral Daily PRN Gonzella Lex, MD      . midazolam (VERSED) injection 2 mg  2 mg Intravenous Once Gonzella Lex, MD      . multivitamin with minerals tablet 1 tablet  1 tablet Oral Daily Gonzella Lex, MD   1 tablet at 01/30/16 0912  . QUEtiapine (SEROQUEL) tablet 1,400 mg  1,400 mg Oral QHS Gonzella Lex, MD   1,400 mg at 01/29/16 2153  . temazepam (RESTORIL) capsule 15 mg  15 mg Oral QHS Gonzella Lex, MD   15 mg at 01/29/16 2153  . warfarin (COUMADIN) tablet 7.5 mg  7.5 mg Oral q1800 Gonzella Lex, MD   7.5 mg at 01/30/16 1653  . Warfarin - Pharmacist Dosing Inpatient   Does not apply q1800 Gonzella Lex, MD        Lab Results:  Results for orders placed or performed during the hospital encounter of 01/04/16 (from the past 48 hour(s))  Glucose, capillary     Status: Abnormal   Collection Time: 01/28/16  9:55 PM  Result Value Ref Range   Glucose-Capillary 166 (H) 65 - 99 mg/dL  Glucose, capillary     Status: Abnormal   Collection Time: 01/29/16  6:39 AM  Result Value Ref Range   Glucose-Capillary 112 (H) 65 - 99 mg/dL    Protime-INR     Status: Abnormal   Collection Time: 01/29/16  7:06 AM  Result Value Ref Range   Prothrombin Time 23.7 (H) 11.4 - 15.2 seconds   INR 2.08   Glucose, capillary     Status: Abnormal   Collection Time: 01/29/16 11:52 AM  Result Value  Ref Range   Glucose-Capillary 108 (H) 65 - 99 mg/dL  Glucose, capillary     Status: Abnormal   Collection Time: 01/29/16  4:38 PM  Result Value Ref Range   Glucose-Capillary 125 (H) 65 - 99 mg/dL  Glucose, capillary     Status: Abnormal   Collection Time: 01/29/16  9:57 PM  Result Value Ref Range   Glucose-Capillary 177 (H) 65 - 99 mg/dL  Glucose, capillary     Status: None   Collection Time: 01/30/16  6:58 AM  Result Value Ref Range   Glucose-Capillary 95 65 - 99 mg/dL   Comment 1 Notify RN   Glucose, capillary     Status: Abnormal   Collection Time: 01/30/16 11:46 AM  Result Value Ref Range   Glucose-Capillary 106 (H) 65 - 99 mg/dL   Comment 1 Notify RN   Glucose, capillary     Status: None   Collection Time: 01/30/16  4:51 PM  Result Value Ref Range   Glucose-Capillary 92 65 - 99 mg/dL    Blood Alcohol level:  Lab Results  Component Value Date   ETH <5 [id number]    Metabolic Disorder Labs: Lab Results  Component Value Date   HGBA1C 7.4 (H) 01/02/2016   MPG 192 12/04/2015   MPG 318 (H) 08/24/2010   Lab Results  Component Value Date   PROLACTIN 4.4 12/11/2015   Lab Results  Component Value Date   CHOL 143 12/11/2015   TRIG 286 (H) 12/11/2015   HDL 32 (L) 12/11/2015   CHOLHDL 4.5 12/11/2015   VLDL 57 (H) 12/11/2015   LDLCALC 54 12/11/2015   LDLCALC 56 12/05/2015    Physical Findings: AIMS: Facial and Oral Movements Muscles of Facial Expression: None, normal Lips and Perioral Area: None, normal Jaw: None, normal Tongue: None, normal,Extremity Movements Upper (arms, wrists, hands, fingers): None, normal Lower (legs, knees, ankles, toes): None, normal, Trunk Movements Neck, shoulders, hips: None, normal,  Overall Severity Severity of abnormal movements (highest score from questions above): None, normal Incapacitation due to abnormal movements: None, normal Patient's awareness of abnormal movements (rate only patient's report): No Awareness, Dental Status Current problems with teeth and/or dentures?: No Does patient usually wear dentures?: No  CIWA:    COWS:     Musculoskeletal: Strength & Muscle Tone: within normal limits Gait & Station: normal Patient leans: N/A  Psychiatric Specialty Exam: Physical Exam  Nursing note and vitals reviewed. Constitutional: He appears well-developed and well-nourished.  HENT:  Head: Normocephalic and atraumatic.  Eyes: EOM are normal.  Neck: Normal range of motion.  Musculoskeletal: Normal range of motion.  Neurological: He is alert.  Skin:     Psychiatric: He has a normal mood and affect. His speech is normal and behavior is normal. Judgment and thought content normal. Cognition and memory are normal.    Review of Systems  Constitutional: Negative.  Negative for chills, fever, malaise/fatigue and weight loss.  HENT: Negative.  Negative for hearing loss, nosebleeds, sore throat and tinnitus.   Eyes: Negative.  Negative for blurred vision and double vision.  Respiratory: Negative.  Negative for cough, hemoptysis, sputum production, shortness of breath and wheezing.   Cardiovascular: Negative.  Negative for chest pain, palpitations and claudication.  Gastrointestinal: Negative.  Negative for abdominal pain, blood in stool, constipation, diarrhea, heartburn, nausea and vomiting.  Genitourinary: Negative for dysuria, frequency and urgency.  Musculoskeletal: Negative.  Negative for back pain, falls, joint pain, myalgias and neck pain.  Skin: Negative.  Negative for  itching and rash.  Neurological: Negative.  Negative for dizziness, tingling, tremors, focal weakness, seizures and headaches.  Endo/Heme/Allergies: Negative for environmental allergies.  Does not bruise/bleed easily.  Psychiatric/Behavioral: Negative for depression, hallucinations, memory loss, substance abuse and suicidal ideas. The patient is not nervous/anxious and does not have insomnia.     Blood pressure 119/78, pulse 75, temperature 98 F (36.7 C), temperature source Oral, resp. rate (!) 21, height 6' (1.829 m), weight 125.2 kg (276 lb), SpO2 97 %.Body mass index is 37.43 kg/m.  General Appearance: Disheveled  Eye Contact:  Fair  Speech:  Pressured  Volume:  Increased  Mood:  Euphoric  Affect:  Labile  Thought Process:  Disorganized  Orientation:  Full (Time, Place, and Person)  Thought Content:  Illogical, Delusions, Rumination and Tangential  Suicidal Thoughts:  No  Homicidal Thoughts:  No  Memory:  Immediate;   Fair Recent;   Poor Remote;   Fair  Judgement:  Impaired  Insight:  Shallow  Psychomotor Activity:  Increased  Concentration:  Concentration: Poor  Recall:  [place] of Knowledge:  Good  Language:  Good  Akathisia:  No  Handed:  Right  AIMS (if indicated):     Assets:  Financial Resources/Insurance Housing Social Support  ADL's:  Impaired  Cognition:  Impaired,  Mild  Sleep:  Number of Hours: 7     Treatment Plan Summary:  I will check lithium level for tomorrow. No change to medicine. I am tentatively putting him on the schedule for ECT on Monday although he really does look quite a bit better. I promised him that I will not try to do treatment if I don't think it's going to be helpful for him. He is tentatively agreeable. No other change to plan for now.  Hyperlipidemia: We'll plan to continue Lipitor 40 mg by mouth daily and fenofibrate 160 mg by mouth daily  Cellulitis of the leg: Will continue Augmentin 1 tablet by mouth twice a day. Cellulitis is slowly improving.  History of pulmonary embolism: We'll continue Coumadin 8 mg by mouth nightly.  Disposition: The patient will be discharged home with his wife because he does have a  stable living situation. Psychotropic medication management follow-up appointment as well as individual therapy will beScheduled with Dr. Thurmond Butts.  01/27/2016 No medication changes. Improving  Patient has been complaining of feeling lightheaded I will assess staff to be check vital signs----VS and CBG was wnl  01/28/16 Patient denies any side effects or physical complaints today. He says he is doing very well. No changes will be made.  Lithium level was 1  Given how much improvement he has, and given that we have always tried to be conscientious that in the past ECT has been a mixed blessing for him, we discussed possibly canceling tomorrow's treatment. Patient would very much prefer to do that. Given that he right now doesn't have obvious outward signs of mania I'm going to agreed to discontinue the ECT for tomorrow. If he is continuing to look as well as he is now we may very well may be able to discharge him in the next day or so. I have passed this on to nursing that we don't need to keep him nothing by mouth tonight. Alethia Berthold, MD 01/30/2016, 7:38 PM

## 2016-01-30 NOTE — Tx Team (Signed)
Interdisciplinary Treatment and Diagnostic Plan Update  01/30/2016 Time of Session: 10:30 am Cameron [name] MRN: [id number]  Principal Diagnosis: Bipolar I disorder, current or most recent episode manic, with psychotic features ([place])  Secondary Diagnoses: Principal Problem:   Bipolar I disorder, current or most recent episode manic, with psychotic features (Cameron [name]) Active Problems:   Diabetes mellitus without complication (Cameron [name])   Hypercholesteremia   CKD (chronic kidney disease), stage III   Cellulitis of leg, right   Bipolar affective disorder, current episode manic (Cameron [name])   Current Medications:  Current Facility-Administered Medications  Medication Dose Route Frequency Provider Last Rate Last Dose  . acetaminophen (TYLENOL) tablet 650 mg  650 mg Oral Q6H PRN Gonzella Lex, MD   650 mg at 01/17/16 0536  . alum & mag hydroxide-simeth (MAALOX/MYLANTA) 200-200-20 MG/5ML suspension 30 mL  30 mL Oral Q4H PRN Gonzella Lex, MD      . atorvastatin (LIPITOR) tablet 40 mg  40 mg Oral q1800 Gonzella Lex, MD   40 mg at 01/29/16 1658  . carbamazepine (TEGRETOL) tablet 1,200 mg  1,200 mg Oral BID PC Gonzella Lex, MD   1,200 mg at 01/30/16 0912  . dextrose 5 % solution 250 mL  250 mL Intravenous Once Gonzella Lex, MD      . enalapril (VASOTEC) tablet 5 mg  5 mg Oral BID Gonzella Lex, MD   5 mg at 01/30/16 0912  . fenofibrate tablet 160 mg  160 mg Oral Daily Gonzella Lex, MD   160 mg at 01/30/16 0912  . glucagon (human recombinant) (GLUCAGEN) injection 1 mg  1 mg Intramuscular Once PRN Gonzella Lex, MD      . haloperidol lactate (HALDOL) injection 20 mg  20 mg Intramuscular Q6H PRN Gonzella Lex, MD      . insulin glargine (LANTUS) injection 30 Units  30 Units Subcutaneous QHS Gonzella Lex, MD   30 Units at 01/29/16 2158  . lamoTRIgine (LAMICTAL) tablet 200 mg  200 mg Oral Daily Gonzella Lex, MD   200 mg at 01/30/16 0913  . lithium carbonate capsule 900 mg  900 mg Oral QHS Gonzella Lex, MD   900 mg at 01/29/16 2152  . LORazepam (ATIVAN) injection 2 mg  2 mg Intramuscular Q6H PRN Gonzella Lex, MD      . LORazepam (ATIVAN) tablet 1 mg  1 mg Oral Q4H PRN Rainey Pines, MD   1 mg at 01/17/16 0545  . magnesium hydroxide (MILK OF MAGNESIA) suspension 30 mL  30 mL Oral Daily PRN Gonzella Lex, MD      . midazolam (VERSED) injection 2 mg  2 mg Intravenous Once Gonzella Lex, MD      . multivitamin with minerals tablet 1 tablet  1 tablet Oral Daily Gonzella Lex, MD   1 tablet at 01/30/16 0912  . QUEtiapine (SEROQUEL) tablet 1,400 mg  1,400 mg Oral QHS Gonzella Lex, MD   1,400 mg at 01/29/16 2153  . temazepam (RESTORIL) capsule 15 mg  15 mg Oral QHS Gonzella Lex, MD   15 mg at 01/29/16 2153  . warfarin (COUMADIN) tablet 7.5 mg  7.5 mg Oral q1800 Gonzella Lex, MD   7.5 mg at 01/29/16 1657  . Warfarin - Pharmacist Dosing Inpatient   Does not apply [id number] Gonzella Lex, MD       PTA Medications: Prescriptions Prior to Admission  Medication Sig Dispense  Refill Last Dose  . amoxicillin-clavulanate (AUGMENTIN) 875-125 MG tablet Take 1 tablet by mouth 2 (two) times daily. X 7 more days 14 tablet 0 01/14/2016  . ARIPiprazole (ABILIFY) 30 MG tablet Take 30 mg by mouth every evening.    01/14/2016  . atorvastatin (LIPITOR) 40 MG tablet Take 40 mg by mouth daily.  11 01/14/2016  . carbamazepine (TEGRETOL) 200 MG tablet Take 2 tablets (400 mg total) by mouth every morning. 30 tablet 0 01/14/2016  . carbamazepine (TEGRETOL) 200 MG tablet Take 3 tablets (600 mg total) by mouth every evening. 30 tablet 0 01/14/2016  . enalapril (VASOTEC) 5 MG tablet Take 5 mg by mouth 2 (two) times daily.   01/29/2016 at 0843  . fenofibrate 160 MG tablet Take 160 mg by mouth daily.   01/14/2016  . insulin aspart (NOVOLOG) 100 UNIT/ML injection Inject 0-9 Units into the skin 3 (three) times daily with meals. 10 mL 11 01/14/2016  . insulin aspart (NOVOLOG) 100 UNIT/ML injection Inject 0-5 Units into the skin  at bedtime. 10 mL 11 01/14/2016  . Insulin Glargine (LANTUS SOLOSTAR) 100 UNIT/ML Solostar Pen Inject 60 Units into the skin daily.    01/14/2016  . lamoTRIgine (LAMICTAL) 100 MG tablet Take 400 mg by mouth at bedtime.   01/14/2016  . Multiple Vitamin (MULTIVITAMIN WITH MINERALS) TABS tablet Take 1 tablet by mouth daily.   01/14/2016  . QUEtiapine (SEROQUEL) 400 MG tablet Take 3 tablets (1,200 mg total) by mouth at bedtime. 90 tablet 0 01/14/2016  . TANZEUM 50 MG PEN Inject 50 mg into the skin every 7 (seven) days.  2 01/14/2016  . temazepam (RESTORIL) 15 MG capsule Take 15 mg by mouth at bedtime as needed for sleep.   01/14/2016  . warfarin (COUMADIN) 7.5 MG tablet Take 1 tablet (7.5 mg total) by mouth daily at 6 PM. 30 tablet 0 01/14/2016    Treatment Modalities: Medication Management, Group therapy, Case management,  1 to 1 session with clinician, Psychoeducation, Recreational therapy.   Physician Treatment Plan for Primary Diagnosis: Bipolar I disorder, current or most recent episode manic, with psychotic features ([place]) Long Term Goal(s): Improvement in symptoms so as ready for discharge   Short Term Goals: Ability to demonstrate self-control will improve  Medication Management: Evaluate patient's response, side effects, and tolerance of medication regimen.  Therapeutic Interventions: 1 to 1 sessions, Unit Group sessions and Medication administration.  Evaluation of Outcomes: Progressing  Physician Treatment Plan for Secondary Diagnosis: Principal Problem:   Bipolar I disorder, current or most recent episode manic, with psychotic features (Cameron [name]) Active Problems:   Diabetes mellitus without complication ([place])   Hypercholesteremia   CKD (chronic kidney disease), stage III   Cellulitis of leg, right   Bipolar affective disorder, current episode manic (Cameron [name])  Long Term Goal(s): Improvement in symptoms so as ready for discharge  Short Term Goals: Ability to identify changes in lifestyle to  reduce recurrence of condition will improve, Ability to demonstrate self-control will improve and Ability to maintain clinical measurements within normal limits will improve  Medication Management: Evaluate patient's response, side effects, and tolerance of medication regimen.  Therapeutic Interventions: 1 to 1 sessions, Unit Group sessions and Medication administration.  Evaluation of Outcomes: Progressing   RN Treatment Plan for Primary Diagnosis: Bipolar I disorder, current or most recent episode manic, with psychotic features ([place]) Long Term Goal(s): Knowledge of disease and therapeutic regimen to maintain health will improve  Short Term Goals: Ability to remain free from  injury will improve, Ability to verbalize frustration and anger appropriately will improve, Ability to demonstrate self-control, Ability to participate in decision making will improve, Ability to verbalize feelings will improve, Ability to disclose and discuss suicidal ideas, Ability to identify and develop effective coping behaviors will improve and Compliance with prescribed medications will improve  Medication Management: RN will administer medications as ordered by provider, will assess and evaluate patient's response and provide education to patient for prescribed medication. RN will report any adverse and/or side effects to prescribing provider.  Therapeutic Interventions: 1 on 1 counseling sessions, Psychoeducation, Medication administration, Evaluate responses to treatment, Monitor vital signs and CBGs as ordered, Perform/monitor CIWA, COWS, AIMS and Fall Risk screenings as ordered, Perform wound care treatments as ordered.  Evaluation of Outcomes: Progressing   LCSW Treatment Plan for Primary Diagnosis: Bipolar I disorder, current or most recent episode manic, with psychotic features ([place]) Long Term Goal(s): Safe transition to appropriate next level of care at discharge, Engage patient in therapeutic group  addressing interpersonal concerns.  Short Term Goals: Engage patient in aftercare planning with referrals and resources, Increase ability to appropriately verbalize feelings, Increase emotional regulation and Increase skills for wellness and recovery  Therapeutic Interventions: Assess for all discharge needs, 1 to 1 time with Social worker, Explore available resources and support systems, Assess for adequacy in community support network, Educate family and significant other(s) on suicide prevention, Complete Psychosocial Assessment, Interpersonal group therapy.  Evaluation of Outcomes: Progressing   Progress in Treatment: Attending groups: Yes. Sometimes not attending, depending on ECT schedule Participating in groups: Yes. Taking medication as prescribed: Yes. Toleration medication: Yes. Family/Significant other contact made: Yes, individual(s) contacted:   Pt wife Patient understands diagnosis: Yes. Discussing patient identified problems/goals with staff: Yes. Medical problems stabilized or resolved: Yes. Denies suicidal/homicidal ideation: Yes. Issues/concerns per patient self-inventory: No. Other:   New problem(s) identified: No, Describe:      New Short Term/Long Term Goal(s):  Discharge Plan or Barriers: Home with wife and follow up with Dr. Thurmond Butts, long-term psychiatrist.  Reason for Continuation of Hospitalization: Mania Medication stabilization Suicidal ideation Other; describe ECT treatment  Estimated Length of Stay:3-5 days  Attendees: Patient: 01/30/2016 1:46 PM  Physician: Alethia Berthold 01/30/2016 1:46 PM  Nursing: Elige Radon, RN 01/30/2016 1:46 PM  RN Care Manager: 01/30/2016 1:46 PM  Social Worker: Dossie Arbour, LCSW 01/30/2016 1:46 PM  Recreational Therapist: Everitt Amber, LRT 01/30/2016 1:46 PM  Other:  01/30/2016 1:46 PM  Other:  01/30/2016 1:46 PM  Other: 01/30/2016 1:46 PM    Scribe for Treatment Team: August Saucer, LCSW 01/30/2016 1:46 PM

## 2016-01-30 NOTE — Progress Notes (Signed)
D:Cameron [name] appears much brighter today and positive. States he got to see Dr. Thurmond Butts on Friday and this appears to helped. Still tangential. Denies SI/HI/AVH. Visible in the milieu. Interacts with peers well. Went to group. A: Medication given with education. Encouragement provided.  R: Cameron [name] was compliant with medication. He has remained calm and cooperative. Safety maintained with 15 min checks

## 2016-01-30 NOTE — [facility] Group Notes (Signed)
[facility] Group Notes:  (Nursing/MHT/Case Management/Adjunct)  Date:  01/30/2016  Time:  1:55 PM  Type of Therapy:  Psychoeducational Skills  Participation Level:  Active  Participation Quality:  Appropriate, Attentive and Supportive  Affect:  Appropriate  Cognitive:  Appropriate  Insight:  Appropriate  Engagement in Group:  Engaged and Supportive  Modes of Intervention:  Discussion and Education  Summary of Progress/Problems:  Charise Killian 01/30/2016, 1:55 PM

## 2016-01-30 NOTE — Plan of Care (Signed)
Problem: Physical Regulation: Goal: Ability to maintain clinical measurements within normal limits will improve Outcome: Progressing Vitals wnl this evening.

## 2016-01-30 NOTE — [facility] Group Notes (Signed)
[place] LCSW Group Therapy  01/30/2016 5:02 PM  Type of Therapy/Topic:  Group Therapy:  Feelings about Diagnosis  Participation Level:  Active  Mood: Good Mood   Description of Group:    This group will allow patients to explore their thoughts and feelings about diagnoses they have received. Patients will be guided to explore their level of understanding and acceptance of these diagnoses. Facilitator will encourage patients to process their thoughts and feelings about the reactions of others to their diagnosis, and will guide patients in identifying ways to discuss their diagnosis with significant others in their lives. This group will be process-oriented, with patients participating in exploration of their own experiences as well as giving and receiving support and challenge from other group members.   Therapeutic Goals: 1. Patient will demonstrate understanding of diagnosis as evidence by identifying two or more symptoms of the disorder:  2. Patient will be able to express two feelings regarding the diagnosis 3. Patient will demonstrate ability to communicate their needs through discussion and/or role plays  Summary of Patient Progress:  Pt able to meet therapeutic goals. Expresses concerns about missing too much work.  Seems to have good insight into his diagnosis.  Remains a bit hyperverbal and disorganized, but is redirectable.  Presents more flat and less manic. Supportive of other Pts.   Therapeutic Modalities:   Cognitive Behavioral Therapy Brief Therapy Feelings Identification    August Saucer, MSW, LCSW 01/30/2016, 5:02 PM

## 2016-01-30 NOTE — Progress Notes (Signed)
Recreation Therapy Notes  Date: 08.29.17 Time: 9:30 am Location: Craft Room  Group Topic: Goal Setting  Goal Area(s) Addresses:  Patient will write at least one goal. Patient will write at least one obstacle.  Behavioral Response: Did not attend  Intervention: Recovery Goal Chart  Activity: Patients were instructed to make a Recovery Goal Chart including goals, obstacles, the date they started working on their goals, and the date they achieved their goals.  Education: LRT educated patients on ways they can celebrate reaching their goals in a healthy way.  Education Outcome: Patient did not attend group.  Clinical Observations/Feedback: Patient did not attend group.   [name], LRT/CTRS 01/30/2016 10:07 AM

## 2016-01-31 LAB — PROTIME-INR
INR: 2.1
PROTHROMBIN TIME: 23.9 s — AB (ref 11.4–15.2)

## 2016-01-31 LAB — GLUCOSE, CAPILLARY
GLUCOSE-CAPILLARY: 92 mg/dL (ref 65–99)
Glucose-Capillary: 159 mg/dL — ABNORMAL HIGH (ref 65–99)
Glucose-Capillary: 125 mg/dL — ABNORMAL HIGH (ref 65–99)
Glucose-Capillary: 107 mg/dL — ABNORMAL HIGH (ref 65–99)

## 2016-01-31 LAB — CBC
HCT: 36.4 % — ABNORMAL LOW (ref 40.0–52.0)
Hemoglobin: 12.4 g/dL — ABNORMAL LOW (ref 13.0–18.0)
MCH: 29.1 pg (ref 26.0–34.0)
MCHC: 34 g/dL (ref 32.0–36.0)
MCV: 85.6 fL (ref 80.0–100.0)
PLATELETS: 201 10*3/uL (ref 150–440)
RBC: 4.25 MIL/uL — AB (ref 4.40–5.90)
RDW: 13.6 % (ref 11.5–14.5)
WBC: 3.1 10*3/uL — ABNORMAL LOW (ref 3.8–10.6)

## 2016-01-31 NOTE — [facility] Group Notes (Signed)
[place] LCSW Group Therapy  01/31/2016 4:21 PM  Type of Therapy/Topic:  Group Therapy:  Emotion Regulation  Participation Level:  Active  Mood:  Reports Good mood  Description of Group:    The purpose of this group is to assist patients in learning to regulate negative emotions and experience positive emotions. Patients will be guided to discuss ways in which they have been vulnerable to their negative emotions. These vulnerabilities will be juxtaposed with experiences of positive emotions or situations, and patients challenged to use positive emotions to combat negative ones. Special emphasis will be placed on coping with negative emotions in conflict situations, and patients will process healthy conflict resolution skills.  Therapeutic Goals: 1. Patient will identify two positive emotions or experiences to reflect on in order to balance out negative emotions:  2. Patient will label two or more emotions that they find the most difficult to experience:  3. Patient will be able to demonstrate positive conflict resolution skills through discussion or role plays:   Summary of Patient Progress: Pt able to achieve above therapeutic goals.  Pt participated actively, attentive to others, added appropriately to group discussion.  At times a little tangential on a topic unrelated to group discussion, but could bring it back to topic at hand and have good conversation with peers.      Therapeutic Modalities:   Cognitive Behavioral Therapy Feelings Identification Dialectical Behavioral Therapy   August Saucer, MSW, LCSW 01/31/2016, 4:21 PM

## 2016-01-31 NOTE — Progress Notes (Signed)
D: Observed pt in day room interacting with peers. Patient alert and oriented x4. Patient denies SI/HI/AVH. Pt affect is appropriate to circumstance. Pt is calm, has a logical and coherent thought process. Pt stated his day was "really good..I might go home tomorrow." Pt stated "I'm ready to get back to regular life." Pt denied feeling anxious or depressed.   A: Offered active listening and support. Provided therapeutic communication. Administered scheduled medications. Acknowledged and supported pt's continued improvement. R: Pt calm, pleasant, and cooperative. Pt medication compliant. Will continue Q15 min. checks. Safety maintained.

## 2016-01-31 NOTE — Progress Notes (Signed)
ANTICOAGULATION CONSULT NOTE - Follow Up Consult  Pharmacy Consult for Warfarin Indication: VTE treatment  Allergies  Allergen Reactions  . Asa [Aspirin] Other (See Comments)    Patient tolerates LOW DOSE ASPIRIN.    Patient Measurements: Height: 6' (182.9 cm) Weight: 276 lb (125.2 kg) IBW/kg (Calculated) : 77.6  Vital Signs: BP: 101/58 (08/30 0735) Pulse Rate: 68 (08/30 0735)  Labs:  Recent Labs  01/29/16 0706 01/31/16 0731  HGB  --  12.4*  HCT  --  36.4*  PLT  --  201  LABPROT 23.7* 23.9*  INR 2.08 2.10   CrCl cannot be calculated (Patient's most recent lab result is older than the maximum 21 days allowed.).  Medical History: Past Medical History:  Diagnosis Date  . Bipolar 1 disorder ([place])   . CKD (chronic kidney disease), stage III   . Diabetes mellitus without complication ([place])   . DVT (deep venous thrombosis) ([place])   . Hypercholesteremia   . Hypertension    Medications:  Warfarin   Assessment: 53 yom admitted to ED [facility] with manic symptoms. Recently discharged from [facility] with DVT/PE, had been bridging LMWH and VKA prior to admission.  INR History: 7/9: INR - 1.99 7/10: no INR- warfarin 12.5 mg po daily at 0200 7/11: INR - 2.97- warfarin held 7/12: INR - 1.92 - warfarin 10 mg 7/13: INR - 1.95 - warfarin 10 mg 7/14: INR - 2.56 - warfarin 5 mg 7/15: INR - 2.73 - warfarin 5 mg 7/16: INR - 2.39 - warfarin 7.5 mg 7/17: INR - 2.22 - warfarin 7.5 mg 7/18: INR - 2.53 - warfarin 7.5 mg 7/19: INR - 2.75 - warfarin 7.5 mg 7/20: INR - 2.99 - warfarin 6.5 mg 7/21: INR - 2.64 - warfarin 6.5 mg  7/22: INR: 2.44; warfarin 8  7/23: INR: 2.60;  Warfarin 10mg  7/24: INR 2.44 - warfarin 10mg  7/25:  INR 3.22 - warfarin 10mg  7/26:  INR 3.08 - warfarin 8mg  7/27:  INR 3.24 - warfarin 8 mg 7/28:  INR 2.79 - 8 mg 7/29:  INR 2.74- 8 mg 7/30:  INR 3.19 - 7 mg 7/31:  INR 2.73  7.5mg  8/1:    INR 3.13  7.5mg  8/2:    INR 2.74  7.5mg  8/3     INR 2.27  7.5mg  8/4     INR  3.19  8 mg 8/5     INR 3.04  7.5 mg 8/6     INR 3.05  7.5mg  8/7:    INR  3.22  6mg  8/8:    INR  3.85  Dose held per protocol 8/9:    INR 2.4     6mg  8/10:  INR 1.93   7mg   8/11:  INR  2.00  7mg  8/12:  INR  2.26  6 mg 8/13:  INR 2.22   7 mg 8/14:  INR 2.18   7mg  8/15:  INR 2.42   NONE given 8/16:  INR 1.77  7.5mg   8/17:  INR 1.43  ? Not given 8/18:  INR Not done  Warfarin 7.5 mg 8/19: INR: 1.84;  7.5 mg  8/20: INR: 1.98   7.5mg  8/21:  INR 2.09   8 mg 8/22:  INR  2.27  8 mg 8/23:  INR  2.42  7.5mg  8/24:  INR  2.51  7.5mg  8/25:  INR  2.39  7.5 mg 8/26:  INR  2.13  7.5 mg 8/27:  INR  2.24  7.5mg  8/28:  INR  2.08  7.5mg    Changed INR to every other day 8/29:                   7.5mg  8/30:  INR 2.10   Goal of Therapy:  INR 2-3 Monitor platelets by anticoagulation protocol: Yes   Plan:  Will continue warfarin to 7.5 mg PO daily.   (Patient with orders for carbamazepine which interacts with warfarin to decrease the INR. Patient has orders for fenofibrate which interacts with warfarin to increase the INR. Will follow INR closely.)  Pt will need CBC q3 days per policy.   8/30- INR now every other day (Q48hr).  Will continue current order for 7.5 mg daily. Next INR 9/1.    Annalei Friesz A, RPH 01/31/2016,1:40 PM

## 2016-01-31 NOTE — [facility] Group Notes (Signed)
[place] Group Notes:  (Nursing/MHT/Case Management/Adjunct)  Date:  01/31/2016  Time:  10:42 PM  Type of Therapy:  Evening Wrap-up Group  Participation Level:  Did Not Attend  Participation Quality:  N/A  Affect:  N/A  Cognitive:  N/A  Insight:  None  Engagement in Group:  Did Not Attend  Modes of Intervention:  Activity and Discussion  Summary of Progress/Problems:  Levonne Spiller 01/31/2016, 10:42 PM

## 2016-01-31 NOTE — Plan of Care (Signed)
Problem: Education: Goal: Will be free of psychotic symptoms Outcome: Progressing Pt calm. Pt not expressing any delusion or any manic symptoms this evening.

## 2016-01-31 NOTE — Progress Notes (Signed)
Patient with appropriate affect, cooperative behavior with meals, meds and plan of care. No SI/HI at this time. Patient interacting appropriately with peers and staff. Communicating effectively. Therapy groups encouraged to learn and initiate coping skills for management of stressors and diagnosis. Safety maintained.

## 2016-01-31 NOTE — Progress Notes (Signed)
[facility] MD Progress Note  01/31/2016 6:56 PM Cameron [name]  MRN:  [id number] Subjective:  Patient was calm, pleasant and cooperative. He was appropriate during interaction. He was redirectable, Thought process was linear. He denies having SI or HI or hallucinations. He has been is sleeping well. No physical complaints or side effects from medications were reported today.  Patient has been participated in all groups but has not been disruptive. Follow-up for Wednesday the 30th. Patient did not have ECT today. On evaluation he has no new complaints. His mood continues to be reported as feeling much better and more calm. He is not showing any pressured speech. His thoughts do not appear to be disorganized or bizarre. He is able to engage in lucid conversations without flight of ideas. Case reviewed with nursing. Nursing staff agree that he has been doing well without any symptoms of mania or day. I will try to give his wife a call this evening. Patient is not complaining of any new physical symptoms. Appears to be tolerating medicine well. Per nursing: D:  Denies SI/HI/AVH.  Patient is pleasant and cooperative.  Visible in the milieu, interacting appropriately.   A:  Support and encouragement offered.  Scheduled medications administered according to orders. R:  Receptive to treatment regiment.  Safety maintained.   Principal Problem: Bipolar I disorder, current or most recent episode manic, with psychotic features ([place])   Diagnosis:   Patient Active Problem List   Diagnosis Date Noted  . Bipolar affective disorder, current episode manic ([place]) [F31.9] 01/04/2016  . Cellulitis of leg, right [L03.115] 01/01/2016  . Bipolar I disorder, current or most recent episode manic, with psychotic features ([place]) [F31.2] 12/11/2015  . Pulmonary embolism ([place]) [I26.99] 12/04/2015  . Diabetes mellitus without complication ([place]) [id number]   . Hypercholesteremia [E78.00]   . CKD (chronic kidney disease), stage III  [N18.3]   . Acute deep vein thrombosis (DVT) of femoral vein of right lower extremity (H[facility]) [I82.411]    Total Time spent with patient: 20 minutes  Past Psychiatric History: Long history of bipolar disorder multiple manic episodes. History of response to ECT.  Past Medical History:  Past Medical History:  Diagnosis Date  . Bipolar 1 disorder ([place])   . CKD (chronic kidney disease), stage III   . Diabetes mellitus without complication ([place])   . DVT (deep venous thrombosis) ([place])   . Hypercholesteremia   . Hypertension     Past Surgical History:  Procedure Laterality Date  . APPENDECTOMY     Family History:  Family History  Problem Relation Age of Onset  . Stroke Other   . Diabetes Other    Family Psychiatric  History: Positive for bipolar disorder Social History:  History  Alcohol Use  . Yes    Comment: rarely     History  Drug Use No    Social History   Social History  . Marital status: Married    Spouse name: N/A  . Number of children: N/A  . Years of education: N/A   Social History Main Topics  . Smoking status: Never Smoker  . Smokeless tobacco: Never Used  . Alcohol use Yes     Comment: rarely  . Drug use: No  . Sexual activity: Not Asked   Other Topics Concern  . None   Social History Narrative  . None   Current Medications: Current Facility-Administered Medications  Medication Dose Route Frequency Provider Last Rate Last Dose  . acetaminophen (TYLENOL) tablet 650 mg  650 mg Oral Q6H PRN Gonzella Lex, MD   650 mg at 01/17/16 0536  . alum & mag hydroxide-simeth (MAALOX/MYLANTA) 200-200-20 MG/5ML suspension 30 mL  30 mL Oral Q4H PRN Gonzella Lex, MD      . atorvastatin (LIPITOR) tablet 40 mg  40 mg Oral q1800 Gonzella Lex, MD   40 mg at 01/31/16 1645  . carbamazepine (TEGRETOL) tablet 1,200 mg  1,200 mg Oral BID PC Gonzella Lex, MD   1,200 mg at 01/31/16 1637  . dextrose 5 % solution 250 mL  250 mL Intravenous Once Gonzella Lex, MD      .  enalapril (VASOTEC) tablet 5 mg  5 mg Oral BID Gonzella Lex, MD   5 mg at 01/31/16 0917  . fenofibrate tablet 160 mg  160 mg Oral Daily Gonzella Lex, MD   160 mg at 01/31/16 0917  . glucagon (human recombinant) (GLUCAGEN) injection 1 mg  1 mg Intramuscular Once PRN Gonzella Lex, MD      . haloperidol lactate (HALDOL) injection 20 mg  20 mg Intramuscular Q6H PRN Gonzella Lex, MD      . insulin glargine (LANTUS) injection 30 Units  30 Units Subcutaneous QHS Gonzella Lex, MD   30 Units at 01/30/16 2132  . lamoTRIgine (LAMICTAL) tablet 200 mg  200 mg Oral Daily Gonzella Lex, MD   200 mg at 01/31/16 0917  . lithium carbonate capsule 900 mg  900 mg Oral QHS Gonzella Lex, MD   900 mg at 01/30/16 2132  . LORazepam (ATIVAN) injection 2 mg  2 mg Intramuscular Q6H PRN Gonzella Lex, MD      . LORazepam (ATIVAN) tablet 1 mg  1 mg Oral Q4H PRN Rainey Pines, MD   1 mg at 01/17/16 0545  . magnesium hydroxide (MILK OF MAGNESIA) suspension 30 mL  30 mL Oral Daily PRN Gonzella Lex, MD      . midazolam (VERSED) injection 2 mg  2 mg Intravenous Once Gonzella Lex, MD      . multivitamin with minerals tablet 1 tablet  1 tablet Oral Daily Gonzella Lex, MD   1 tablet at 01/31/16 [phone]  . QUEtiapine (SEROQUEL) tablet 1,400 mg  1,400 mg Oral QHS Gonzella Lex, MD   1,400 mg at 01/30/16 2131  . temazepam (RESTORIL) capsule 15 mg  15 mg Oral QHS Gonzella Lex, MD   15 mg at 01/30/16 2132  . warfarin (COUMADIN) tablet 7.5 mg  7.5 mg Oral q1800 Gonzella Lex, MD   7.5 mg at 01/31/16 1645  . Warfarin - Pharmacist Dosing Inpatient   Does not apply q1800 Gonzella Lex, MD        Lab Results:  Results for orders placed or performed during the hospital encounter of 01/04/16 (from the past 48 hour(s))  Glucose, capillary     Status: Abnormal   Collection Time: 01/29/16  9:57 PM  Result Value Ref Range   Glucose-Capillary 177 (H) 65 - 99 mg/dL  Glucose, capillary     Status: None   Collection Time:  01/30/16  6:58 AM  Result Value Ref Range   Glucose-Capillary 95 65 - 99 mg/dL   Comment 1 Notify RN   Glucose, capillary     Status: Abnormal   Collection Time: 01/30/16 11:46 AM  Result Value Ref Range   Glucose-Capillary 106 (H) 65 - 99 mg/dL   Comment 1 Notify  RN   Glucose, capillary     Status: None   Collection Time: 01/30/16  4:51 PM  Result Value Ref Range   Glucose-Capillary 92 65 - 99 mg/dL  Glucose, capillary     Status: Abnormal   Collection Time: 01/30/16  8:58 PM  Result Value Ref Range   Glucose-Capillary 109 (H) 65 - 99 mg/dL  Glucose, capillary     Status: None   Collection Time: 01/31/16  7:25 AM  Result Value Ref Range   Glucose-Capillary 92 65 - 99 mg/dL   Comment 1 Notify RN   Protime-INR     Status: Abnormal   Collection Time: 01/31/16  7:31 AM  Result Value Ref Range   Prothrombin Time 23.9 (H) 11.4 - 15.2 seconds   INR 2.10   CBC     Status: Abnormal   Collection Time: 01/31/16  7:31 AM  Result Value Ref Range   WBC 3.1 (L) 3.8 - 10.6 K/uL   RBC 4.25 (L) 4.40 - 5.90 MIL/uL   Hemoglobin 12.4 (L) 13.0 - 18.0 g/dL   HCT 36.4 (L) 40.0 - 52.0 %   MCV 85.6 80.0 - 100.0 fL   MCH 29.1 26.0 - 34.0 pg   MCHC 34.0 32.0 - 36.0 g/dL   RDW 13.6 11.5 - 14.5 %   Platelets 201 150 - 440 K/uL  Glucose, capillary     Status: Abnormal   Collection Time: 01/31/16 11:41 AM  Result Value Ref Range   Glucose-Capillary 159 (H) 65 - 99 mg/dL   Comment 1 Notify RN   Glucose, capillary     Status: Abnormal   Collection Time: 01/31/16  4:38 PM  Result Value Ref Range   Glucose-Capillary 125 (H) 65 - 99 mg/dL    Blood Alcohol level:  Lab Results  Component Value Date   ETH <5 [id number]    Metabolic Disorder Labs: Lab Results  Component Value Date   HGBA1C 7.4 (H) 01/02/2016   MPG 192 12/04/2015   MPG 318 (H) 08/24/2010   Lab Results  Component Value Date   PROLACTIN 4.4 12/11/2015   Lab Results  Component Value Date   CHOL 143 12/11/2015   TRIG 286 (H)  12/11/2015   HDL 32 (L) 12/11/2015   CHOLHDL 4.5 12/11/2015   VLDL 57 (H) 12/11/2015   LDLCALC 54 12/11/2015   LDLCALC 56 12/05/2015    Physical Findings: AIMS: Facial and Oral Movements Muscles of Facial Expression: None, normal Lips and Perioral Area: None, normal Jaw: None, normal Tongue: None, normal,Extremity Movements Upper (arms, wrists, hands, fingers): None, normal Lower (legs, knees, ankles, toes): None, normal, Trunk Movements Neck, shoulders, hips: None, normal, Overall Severity Severity of abnormal movements (highest score from questions above): None, normal Incapacitation due to abnormal movements: None, normal Patient's awareness of abnormal movements (rate only patient's report): No Awareness, Dental Status Current problems with teeth and/or dentures?: No Does patient usually wear dentures?: No  CIWA:    COWS:     Musculoskeletal: Strength & Muscle Tone: within normal limits Gait & Station: normal Patient leans: N/A  Psychiatric Specialty Exam: Physical Exam  Nursing note and vitals reviewed. Constitutional: He appears well-developed and well-nourished.  HENT:  Head: Normocephalic and atraumatic.  Eyes: EOM are normal.  Neck: Normal range of motion.  Musculoskeletal: Normal range of motion.  Neurological: He is alert.  Skin:     Psychiatric: He has a normal mood and affect. His speech is normal and behavior is normal. Judgment and  thought content normal. Cognition and memory are normal.    Review of Systems  Constitutional: Negative.  Negative for chills, fever, malaise/fatigue and weight loss.  HENT: Negative.  Negative for hearing loss, nosebleeds, sore throat and tinnitus.   Eyes: Negative.  Negative for blurred vision and double vision.  Respiratory: Negative.  Negative for cough, hemoptysis, sputum production, shortness of breath and wheezing.   Cardiovascular: Negative.  Negative for chest pain, palpitations and claudication.  Gastrointestinal:  Negative.  Negative for abdominal pain, blood in stool, constipation, diarrhea, heartburn, nausea and vomiting.  Genitourinary: Negative for dysuria, frequency and urgency.  Musculoskeletal: Negative.  Negative for back pain, falls, joint pain, myalgias and neck pain.  Skin: Negative.  Negative for itching and rash.  Neurological: Negative.  Negative for dizziness, tingling, tremors, focal weakness, seizures and headaches.  Endo/Heme/Allergies: Negative for environmental allergies. Does not bruise/bleed easily.  Psychiatric/Behavioral: Negative for depression, hallucinations, memory loss, substance abuse and suicidal ideas. The patient is not nervous/anxious and does not have insomnia.     Blood pressure (!) 101/58, pulse 68, temperature 98 F (36.7 C), temperature source Oral, resp. rate 18, height 6' (1.829 m), weight 125.2 kg (276 lb), SpO2 100 %.Body mass index is 37.43 kg/m.  General Appearance: Disheveled  Eye Contact:  Fair  Speech:  Pressured  Volume:  Increased  Mood:  Euphoric  Affect:  Labile  Thought Process:  Disorganized  Orientation:  Full (Time, Place, and Person)  Thought Content:  Illogical, Delusions, Rumination and Tangential  Suicidal Thoughts:  No  Homicidal Thoughts:  No  Memory:  Immediate;   Fair Recent;   Poor Remote;   Fair  Judgement:  Impaired  Insight:  Shallow  Psychomotor Activity:  Increased  Concentration:  Concentration: Poor  Recall:  [place] of Knowledge:  Good  Language:  Good  Akathisia:  No  Handed:  Right  AIMS (if indicated):     Assets:  Financial Resources/Insurance Housing Social Support  ADL's:  Impaired  Cognition:  Impaired,  Mild  Sleep:  Number of Hours: 6.75     Treatment Plan Summary:  I will check lithium level for tomorrow. No change to medicine. I am tentatively putting him on the schedule for ECT on Monday although he really does look quite a bit better. I promised him that I will not try to do treatment if I  don't think it's going to be helpful for him. He is tentatively agreeable. No other change to plan for now.  Hyperlipidemia: We'll plan to continue Lipitor 40 mg by mouth daily and fenofibrate 160 mg by mouth daily  Cellulitis of the leg: Will continue Augmentin 1 tablet by mouth twice a day. Cellulitis is slowly improving.  History of pulmonary embolism: We'll continue Coumadin 8 mg by mouth nightly.  Disposition: The patient will be discharged home with his wife because he does have a stable living situation. Psychotropic medication management follow-up appointment as well as individual therapy will beScheduled with Dr. Thurmond Butts.  01/27/2016 No medication changes. Improving  Patient has been complaining of feeling lightheaded I will assess staff to be check vital signs----VS and CBG was wnl  01/28/16 Patient denies any side effects or physical complaints today. He says he is doing very well. No changes will be made.  Lithium level was 1  No change to medicine for today. I will try and reach his wife. I am anticipating a possible discharge either tomorrow or the next day unless there is  some new worsening of his symptoms. A survey reviewed with social work and nursing. No change to medicine for now. Leg, by the way, has healed up very nicely. His blood sugars are staying under good control in the low 100s. Alethia Berthold, MD 01/31/2016, 6:56 PM

## 2016-01-31 NOTE — [facility] Group Notes (Signed)
[place] Group Notes:  (Nursing/MHT/Case Management/Adjunct)  Date:  01/31/2016  Time:  5:28 AM  Type of Therapy:  Psychoeducational Skills  Participation Level:  Active  Participation Quality:  Appropriate, Attentive, Sharing and Supportive  Affect:  Appropriate  Cognitive:  Appropriate  Insight:  Appropriate, Good and Improving  Engagement in Group:  Engaged and Supportive  Modes of Intervention:  Discussion, Socialization and Support  Summary of Progress/Problems:  [name] 01/31/2016, 5:28 AM

## 2016-01-31 NOTE — Plan of Care (Signed)
Problem: Education: Goal: Emotional status will improve Outcome: Progressing Patient mood is calm and interacting appropriately and pleasantly with staff and peers.

## 2016-02-01 LAB — GLUCOSE, CAPILLARY
GLUCOSE-CAPILLARY: 99 mg/dL (ref 65–99)
GLUCOSE-CAPILLARY: 148 mg/dL — AB (ref 65–99)
Glucose-Capillary: 130 mg/dL — ABNORMAL HIGH (ref 65–99)

## 2016-02-01 MED ORDER — INSULIN GLARGINE 100 UNIT/ML ~~LOC~~ SOLN: 30.0000 [IU] | Freq: Every day | SUBCUTANEOUS | 11 refills | Status: DC

## 2016-02-01 MED ORDER — FENOFIBRATE 160 MG PO TABS: 160.0000 mg | ORAL_TABLET | Freq: Every day | ORAL | 1 refills | Status: AC

## 2016-02-01 MED ORDER — LAMOTRIGINE 200 MG PO TABS: 200.0000 mg | ORAL_TABLET | Freq: Every day | ORAL | 1 refills | Status: AC

## 2016-02-01 MED ORDER — WARFARIN SODIUM 7.5 MG PO TABS: 7.5000 mg | ORAL_TABLET | Freq: Every day | ORAL | 1 refills | Status: AC

## 2016-02-01 MED ORDER — TEMAZEPAM 15 MG PO CAPS: 15.0000 mg | ORAL_CAPSULE | Freq: Every day | ORAL | 1 refills | Status: AC

## 2016-02-01 MED ORDER — QUETIAPINE FUMARATE 200 MG PO TABS: 1400.0000 mg | ORAL_TABLET | Freq: Every day | ORAL | 1 refills | Status: AC

## 2016-02-01 MED ORDER — CARBAMAZEPINE 200 MG PO TABS: 1200.0000 mg | ORAL_TABLET | Freq: Two times a day (BID) | ORAL | 1 refills | Status: AC

## 2016-02-01 MED ORDER — ATORVASTATIN CALCIUM 40 MG PO TABS: 40.0000 mg | ORAL_TABLET | Freq: Every day | ORAL | 1 refills | Status: AC

## 2016-02-01 MED ORDER — ENALAPRIL MALEATE 5 MG PO TABS: 5.0000 mg | ORAL_TABLET | Freq: Two times a day (BID) | ORAL | 1 refills | Status: AC

## 2016-02-01 MED ORDER — LITHIUM CARBONATE 300 MG PO CAPS: 900.0000 mg | ORAL_CAPSULE | Freq: Every day | ORAL | 1 refills | Status: DC

## 2016-02-01 MED ORDER — ADULT MULTIVITAMIN W/MINERALS CH: 1.0000 | ORAL_TABLET | Freq: Every day | ORAL | 1 refills | Status: AC

## 2016-02-01 NOTE — Progress Notes (Signed)
Anticipating discharge in am; "I am living tomorrow...Marland Kitchen"

## 2016-02-01 NOTE — Progress Notes (Signed)
ANTICOAGULATION CONSULT NOTE - Follow Up Consult  Pharmacy Consult for Warfarin Indication: VTE treatment  Allergies  Allergen Reactions  . Asa [Aspirin] Other (See Comments)    Patient tolerates LOW DOSE ASPIRIN.    Patient Measurements: Height: 6' (182.9 cm) Weight: 276 lb (125.2 kg) IBW/kg (Calculated) : 77.6  Vital Signs: Temp: 98.8 F (37.1 C) (08/30 2051) Temp Source: Oral (08/30 2051) BP: 116/66 (08/30 2051) Pulse Rate: 73 (08/30 2051)  Labs:  Recent Labs  01/31/16 0731  HGB 12.4*  HCT 36.4*  PLT 201  LABPROT 23.9*  INR 2.10   CrCl cannot be calculated (Patient's most recent lab result is older than the maximum 21 days allowed.).  Medical History: Past Medical History:  Diagnosis Date  . Bipolar 1 disorder ([place])   . CKD (chronic kidney disease), stage III   . Diabetes mellitus without complication ([place])   . DVT (deep venous thrombosis) ([place])   . Hypercholesteremia   . Hypertension    Medications:  Warfarin   Assessment: 46 yom admitted to ED [facility] with manic symptoms. Recently discharged from [facility] with DVT/PE, had been bridging LMWH and VKA prior to admission.  INR History: 7/9: INR - 1.99 7/10: no INR- warfarin 12.5 mg po daily at 0200 7/11: INR - 2.97- warfarin held 7/12: INR - 1.92 - warfarin 10 mg 7/13: INR - 1.95 - warfarin 10 mg 7/14: INR - 2.56 - warfarin 5 mg 7/15: INR - 2.73 - warfarin 5 mg 7/16: INR - 2.39 - warfarin 7.5 mg 7/17: INR - 2.22 - warfarin 7.5 mg 7/18: INR - 2.53 - warfarin 7.5 mg 7/19: INR - 2.75 - warfarin 7.5 mg 7/20: INR - 2.99 - warfarin 6.5 mg 7/21: INR - 2.64 - warfarin 6.5 mg  7/22: INR: 2.44; warfarin 8  7/23: INR: 2.60;  Warfarin 10mg  7/24: INR 2.44 - warfarin 10mg  7/25:  INR 3.22 - warfarin 10mg  7/26:  INR 3.08 - warfarin 8mg  7/27:  INR 3.24 - warfarin 8 mg 7/28:  INR 2.79 - 8 mg 7/29:  INR 2.74- 8 mg 7/30:  INR 3.19 - 7 mg 7/31:  INR 2.73  7.5mg  8/1:    INR 3.13  7.5mg  8/2:    INR 2.74  7.5mg  8/3      INR 2.27  7.5mg  8/4     INR 3.19  8 mg 8/5     INR 3.04  7.5 mg 8/6     INR 3.05  7.5mg  8/7:    INR  3.22  6mg  8/8:    INR  3.85  Dose held per protocol 8/9:    INR 2.4     6mg  8/10:  INR 1.93   7mg   8/11:  INR  2.00  7mg  8/12:  INR  2.26  6 mg 8/13:  INR 2.22   7 mg 8/14:  INR 2.18   7mg  8/15:  INR 2.42   NONE given 8/16:  INR 1.77  7.5mg   8/17:  INR 1.43  ? Not given 8/18:  INR Not done  Warfarin 7.5 mg 8/19: INR: 1.84;  7.5 mg  8/20: INR: 1.98   7.5mg  8/21:  INR 2.09   8 mg 8/22:  INR  2.27  8 mg 8/23:  INR  2.42  7.5mg  8/24:  INR  2.51  7.5mg  8/25:  INR  2.39  7.5 mg 8/26:  INR  2.13  7.5 mg 8/27:  INR  2.24  7.5mg  8/28:  INR  2.08  7.5mg   Changed INR to every other day 8/29:                   7.5mg  8/30:  INR 2.10   7.5 mg 8/31:                     Goal of Therapy:  INR 2-3 Monitor platelets by anticoagulation protocol: Yes   Plan:  Will continue warfarin to 7.5 mg PO daily.   (Patient with orders for carbamazepine which interacts with warfarin to decrease the INR. Patient has orders for fenofibrate which interacts with warfarin to increase the INR. Will follow INR closely.)  Pt will need CBC q3 days per policy.   8/31- INR now every other day (Q48hr).  Will continue current order for 7.5 mg daily. Next INR 9/1.    Remy Dia A, RPH 02/01/2016,7:21 AM

## 2016-02-01 NOTE — [facility] Group Notes (Signed)
[facility] Group Notes:  (Nursing/MHT/Case Management/Adjunct)  Date:  02/01/2016  Time:  4:37 PM  Type of Therapy:  Psychoeducational Skills  Participation Level:  Did Not Attend    Moet Mikulski J Steph Cheadle 02/01/2016, 4:37 PM 

## 2016-02-01 NOTE — Discharge Summary (Signed)
Physician Discharge Summary Note  Patient:  Cameron [name] is an 50 y.o., male MRN:  [id number] DOB:  1965-12-14 Patient phone:  [phone] (home)  Patient address:   [address] [place] [address],  Total Time spent with patient: 35 minutes  Date of Admission:  01/04/2016 Date of Discharge: 02/01/2016  Reason for Admission:  This 50 year old man with a well-established history of bipolar disorder was admitted to the hospital because of manic symptoms including racing thoughts agitated behavior grandiosity disorganized behavior physical agitation. He had very severe flight of ideas and thought disorder and had become nonfunctional and unmanageable outside of the hospital.  Principal Problem: Bipolar I disorder, current or most recent episode manic, with psychotic features [facility]) Discharge Diagnoses: Patient Active Problem List   Diagnosis Date Noted  . Bipolar affective disorder, current episode manic ([place]) [F31.9] 01/04/2016  . Cellulitis of leg, right [L03.115] 01/01/2016  . Bipolar I disorder, current or most recent episode manic, with psychotic features ([place]) [F31.2] 12/11/2015  . Pulmonary embolism ([place]) [I26.99] 12/04/2015  . Diabetes mellitus without complication ([place]) [id number]   . Hypercholesteremia [E78.00]   . CKD (chronic kidney disease), stage III [N18.3]   . Acute deep vein thrombosis (DVT) of femoral vein of right lower extremity (HCC) [I82.411]     Past Psychiatric History: Patient has a well-established long history of bipolar disorder with multiple previous manic episodes. Some depression as well. No known history of suicide attempts. He had been stable for many years prior to this recent worsening of his mania which may have been occasioned by a medical problem and some interruption in his medicines  Past Medical History:  Past Medical History:  Diagnosis Date  . Bipolar 1 disorder ([place])   . CKD (chronic kidney disease), stage III   . Diabetes mellitus  without complication ([place])   . DVT (deep venous thrombosis) ([place])   . Hypercholesteremia   . Hypertension     Past Surgical History:  Procedure Laterality Date  . APPENDECTOMY     Family History:  Family History  Problem Relation Age of Onset  . Stroke Other   . Diabetes Other    Family Psychiatric  History: There is a positive family history of bipolar disorder both in one of his children as well as in one of his parents. Pretty well-established. Social History:  History  Alcohol Use  . Yes    Comment: rarely     History  Drug Use No    Social History   Social History  . Marital status: Married    Spouse name: N/A  . Number of children: N/A  . Years of education: N/A   Social History Main Topics  . Smoking status: Never Smoker  . Smokeless tobacco: Never Used  . Alcohol use Yes     Comment: rarely  . Drug use: No  . Sexual activity: Not Asked   Other Topics Concern  . None   Social History Narrative  . None    Hospital Course:  Patient was admitted to the psychiatric ward. Multiple classic symptoms of euphoric mania. He was treated with medications based on the established medication regimen that he and his outpatient psychiatrist had arrived at over years. Patient showed very slow improvement at first. His past history suggests that it is typical for him for his manias to take a long time to recover even with very aggressive treatment. Patient had a past history of responding well to ECT as part of a  treatment for acute mania. After much discussion with the patient and his family he eventually agreed to cooperate with ECT treatment. Ultimately he received a total of 11 ECT treatments which were delivered as bilateral ECT. He tolerated the treatments well without any sign of delirium or significant memory or cognitive impairment or physical problems. Over the course of that time his medications were also adjusted with gradual increases in his Seroquel and Tegretol as  well as the addition of lithium. All of this has been very well tolerated without any oversedation or sign of delirium. Patient has in the last 2 weeks shown a rapid improvement and in the last several days seems to have returned to his baseline or very close to it. He no longer is showing any signs of obvious mania. His affect and mood are calm and appropriate. His thoughts are lucid and free as much as I can tell from any disorganized bizarre or grandiose thinking. His insight is improved. I have spoken with his wife and with the patient several times about this. We acknowledge that he may not be 100% back to his baseline but that at this point he no longer requires inpatient treatment. There is no sign of acute dangerousness to his behavior. He is fully compliant with treatment. Plan will be for discharge home with follow-up with his outpatient psychiatrist. Dr. Thurmond Butts will be seeing him next Wednesday. I have left a voicemail message at Dr. Reuel Derby office detailing the current medicines. I made it clear to the patient and his wife that if further ECT or hospitalization were necessary we would be very happy to still be available.  Patient's medical problems were several and were managed appropriately in the hospital. He has type 2 diabetes but requires insulin therapy. Blood sugars overall are well controlled. He actually had some occasions of running low blood sugars and we have gradually decreased his total insulin dose. He is currently only taking insulin at nighttime and has not required sliding scale. Patient has been reminded to stay on a low-carb diet and follow-up with his regular outpatient doctors.  Additionally the patient was admitted to the hospital having recently been found to have a deep venous thrombosis and cellulitis. Cellulitis was treated with antibiotic therapy. His hospitalization was interrupted for several days psychiatrically when he had to go to medical service for some intravenous  antibiotics but at this point the infection appears to of cleared up. His legs are looking completely normal. The wound has healed. There is no redness or swelling anymore. He continues to be on warfarin for deep venous thrombosis. His INR tests have ranged mostly in the mid 2s to low threes. Patient understands the risk of anticoagulation and will be continued on the current 7.5 mg of warfarin with a need for ongoing follow-up to manage the anticoagulation.  Most recent Tegretol and lithium levels were in the therapeutic range and there is no sign of acute side effects.  Blood pressure is well controlled.  Patient is being discharged today in the company of his wife.  Physical Findings: AIMS: Facial and Oral Movements Muscles of Facial Expression: None, normal Lips and Perioral Area: None, normal Jaw: None, normal Tongue: None, normal,Extremity Movements Upper (arms, wrists, hands, fingers): None, normal Lower (legs, knees, ankles, toes): None, normal, Trunk Movements Neck, shoulders, hips: None, normal, Overall Severity Severity of abnormal movements (highest score from questions above): None, normal Incapacitation due to abnormal movements: None, normal Patient's awareness of abnormal movements (rate only patient's  report): No Awareness, Dental Status Current problems with teeth and/or dentures?: No Does patient usually wear dentures?: No  CIWA:    COWS:     Musculoskeletal: Strength & Muscle Tone: within normal limits Gait & Station: normal Patient leans: N/A  Psychiatric Specialty Exam: Physical Exam  Nursing note and vitals reviewed. Constitutional: He appears well-developed and well-nourished.  HENT:  Head: Normocephalic and atraumatic.  Eyes: Conjunctivae are normal. Pupils are equal, round, and reactive to light.  Neck: Normal range of motion.  Cardiovascular: Normal heart sounds.   Respiratory: Effort normal.  GI: Soft.  Musculoskeletal: Normal range of motion.    Neurological: He is alert.  Skin: Skin is warm and dry.     Psychiatric: He has a normal mood and affect. His speech is normal and behavior is normal. Judgment and thought content normal. Cognition and memory are normal.    Review of Systems  Constitutional: Negative.   HENT: Negative.   Eyes: Negative.   Respiratory: Negative.   Cardiovascular: Negative.   Gastrointestinal: Negative.   Musculoskeletal: Negative.   Skin: Negative.   Neurological: Negative.   Psychiatric/Behavioral: Negative for depression, hallucinations, memory loss, substance abuse and suicidal ideas. The patient is not nervous/anxious and does not have insomnia.     Blood pressure 116/66, pulse 73, temperature 98.8 F (37.1 C), temperature source Oral, resp. rate 18, height 6' (1.829 m), weight 125.2 kg (276 lb), SpO2 96 %.Body mass index is 37.43 kg/m.  General Appearance: Casual  Eye Contact:  Good  Speech:  Clear and Coherent  Volume:  Normal  Mood:  Euthymic  Affect:  Appropriate  Thought Process:  Goal Directed  Orientation:  Full (Time, Place, and Person)  Thought Content:  Logical  Suicidal Thoughts:  No  Homicidal Thoughts:  No  Memory:  Immediate;   Good Recent;   Good Remote;   Good  Judgement:  Good  Insight:  Good  Psychomotor Activity:  Normal  Concentration:  Concentration: Good  Recall:  Good  Fund of Knowledge:  Good  Language:  Good  Akathisia:  No  Handed:  Right  AIMS (if indicated):     Assets:  Communication Skills Desire for Improvement Financial Resources/Insurance Housing Intimacy Leisure Time Physical Health Resilience Social Support Talents/Skills Transportation Vocational/Educational  ADL's:  Intact  Cognition:  WNL  Sleep:  Number of Hours: 6.45     Have you used any form of tobacco in the last 30 days? (Cigarettes, Smokeless Tobacco, Cigars, and/or Pipes): No  Has this patient used any form of tobacco in the last 30 days? (Cigarettes, Smokeless Tobacco,  Cigars, and/or Pipes) Yes, No  Blood Alcohol level:  Lab Results  Component Value Date   ETH <5 [id number]    Metabolic Disorder Labs:  Lab Results  Component Value Date   HGBA1C 7.4 (H) 01/02/2016   MPG 192 12/04/2015   MPG 318 (H) 08/24/2010   Lab Results  Component Value Date   PROLACTIN 4.4 12/11/2015   Lab Results  Component Value Date   CHOL 143 12/11/2015   TRIG 286 (H) 12/11/2015   HDL 32 (L) 12/11/2015   CHOLHDL 4.5 12/11/2015   VLDL 57 (H) 12/11/2015   LDLCALC 54 12/11/2015   LDLCALC 56 12/05/2015    See Psychiatric Specialty Exam and Suicide Risk Assessment completed by Attending Physician prior to discharge.  Discharge destination:  Home  Is patient on multiple antipsychotic therapies at discharge:  No   Has Patient had three or more  failed trials of antipsychotic monotherapy by history:  No  Recommended Plan for Multiple Antipsychotic Therapies: NA  Discharge Instructions    Diet - low sodium heart healthy    Complete by:  As directed   Increase activity slowly    Complete by:  As directed       Medication List    STOP taking these medications   amoxicillin-clavulanate 875-125 MG tablet Commonly known as:  AUGMENTIN   ARIPiprazole 30 MG tablet Commonly known as:  ABILIFY   insulin aspart 100 UNIT/ML injection Commonly known as:  novoLOG   LANTUS SOLOSTAR 100 UNIT/ML Solostar Pen Generic drug:  Insulin Glargine Replaced by:  insulin glargine 100 UNIT/ML injection   TANZEUM 50 MG Pen Generic drug:  Albiglutide     TAKE these medications     Indication  atorvastatin 40 MG tablet Commonly known as:  LIPITOR Take 1 tablet (40 mg total) by mouth daily at 6 PM. What changed:  when to take this  Indication:  High Amount of Triglycerides in the Blood   carbamazepine 200 MG tablet Commonly known as:  TEGRETOL Take 6 tablets (1,200 mg total) by mouth 2 (two) times daily after a meal. What changed:  how much to take  when to take  this  Another medication with the same name was removed. Continue taking this medication, and follow the directions you see here.  Indication:  Manic-Depression   enalapril 5 MG tablet Commonly known as:  VASOTEC Take 1 tablet (5 mg total) by mouth 2 (two) times daily.  Indication:  High Blood Pressure Disorder   fenofibrate 160 MG tablet Take 1 tablet (160 mg total) by mouth daily.  Indication:  High Amount of Triglycerides in the Blood   insulin glargine 100 UNIT/ML injection Commonly known as:  LANTUS Inject 0.3 mLs (30 Units total) into the skin at bedtime. Replaces:  LANTUS SOLOSTAR 100 UNIT/ML Solostar Pen  Indication:  Type 2 Diabetes   lamoTRIgine 200 MG tablet Commonly known as:  LAMICTAL Take 1 tablet (200 mg total) by mouth daily. What changed:  medication strength  how much to take  when to take this  Indication:  Manic-Depression   lithium carbonate 300 MG capsule Take 3 capsules (900 mg total) by mouth at bedtime.  Indication:  Manic-Depression   multivitamin with minerals Tabs tablet Take 1 tablet by mouth daily.  Indication:  Reversal of Anticoagulant Medication   QUEtiapine 200 MG tablet Commonly known as:  SEROQUEL Take 7 tablets (1,400 mg total) by mouth at bedtime. What changed:  medication strength  how much to take  Indication:  Manic Phase of Manic-Depression   temazepam 15 MG capsule Commonly known as:  RESTORIL Take 1 capsule (15 mg total) by mouth at bedtime. What changed:  when to take this  reasons to take this  Indication:  Trouble Sleeping   warfarin 7.5 MG tablet Commonly known as:  COUMADIN Take 1 tablet (7.5 mg total) by mouth daily at 6 PM.  Indication:  Blood Clot in a Deep Vein      Follow-up [facility] Go on 01/22/2016.   Why:  Please arrive on Monday  August 21st at 4pm for your hospital follow up for therapy Contact information: [address] [place], [place] [address] Ph: [phone] Fax: [phone]       Dr. Ryan-Psychiatry. Go on 02/07/2016.   Why:  3:30pm. Please call to reschedule if any problem keeping appointment. Contact information: [address] [place], [place] [address] Ph: [phone] Fax: [phone] Please call before faxing to insure fax machine is operational and ofice is open.  Fax number is same as phone number so please call before faxing!          Follow-up recommendations:  Activity:  Activity as tolerated gradual increase of exercise. Diet:  Diet low-carb heart healthy Tests:  He understands he needs follow-up medication management which will include blood tests for his anticoagulant as well as his lithium and Tegretol at least Other:  Outpatient psychiatric follow-up in place  Comments:   Prescriptions completed  Signed: Alethia Berthold, MD 02/01/2016, 11:29 AM

## 2016-02-01 NOTE — [facility] Suicide Risk Assessment (Signed)
[facility] Discharge Suicide Risk Assessment   Principal Problem: Bipolar I disorder, current or most recent episode manic, with psychotic features [facility]) Discharge Diagnoses:  Patient Active Problem List   Diagnosis Date Noted  . Bipolar affective disorder, current episode manic ([place]) [F31.9] 01/04/2016  . Cellulitis of leg, right [L03.115] 01/01/2016  . Bipolar I disorder, current or most recent episode manic, with psychotic features ([place]) [F31.2] 12/11/2015  . Pulmonary embolism ([place]) [I26.99] 12/04/2015  . Diabetes mellitus without complication ([place]) [id number]   . Hypercholesteremia [E78.00]   . CKD (chronic kidney disease), stage III [N18.3]   . Acute deep vein thrombosis (DVT) of femoral vein of right lower extremity (HCC) [I82.411]     Total Time spent with patient: 35  Musculoskeletal: Strength & Muscle Tone: within normal limits Gait & Station: normal Patient leans: N/A  Psychiatric Specialty Exam: Review of Systems  Constitutional: Negative.   HENT: Negative.   Eyes: Negative.   Respiratory: Negative.   Cardiovascular: Negative.   Gastrointestinal: Negative.   Musculoskeletal: Negative.   Skin: Negative.   Neurological: Negative.   Psychiatric/Behavioral: Negative for depression, hallucinations, memory loss, substance abuse and suicidal ideas. The patient is not nervous/anxious and does not have insomnia.     Blood pressure 116/66, pulse 73, temperature 98.8 F (37.1 C), temperature source Oral, resp. rate 18, height 6' (1.829 m), weight 125.2 kg (276 lb), SpO2 96 %.Body mass index is 37.43 kg/m.  General Appearance: Casual  Eye Contact::  Good  Speech:  Clear and Coherent409  Volume:  Normal  Mood:  Euthymic  Affect:  Appropriate  Thought Process:  Coherent  Orientation:  Full (Time, Place, and Person)  Thought Content:  Logical  Suicidal Thoughts:  No  Homicidal Thoughts:  No  Memory:  Immediate;   Good Recent;   Fair Remote;   Good  Judgement:  Fair  Insight:   Good  Psychomotor Activity:  Normal  Concentration:  Good  Recall:  Good  Fund of Knowledge:Good  Language: Good  Akathisia:  NA  Handed:  Right  AIMS (if indicated):     Assets:  Communication Skills Desire for Improvement Financial Resources/Insurance Housing Intimacy Resilience Social Support Vocational/Educational  Sleep:  Number of Hours: 6.45  Cognition: WNL  ADL's:  Intact   Mental Status Per Nursing Assessment::   On Admission:     Demographic Factors:  Caucasian  Loss Factors: Patient really does not have any major loss currently. Other than having had 2 months out from work but at this point he appears confident that he will be able to get back to work without much difficulty. Appears to have a lot of support socially from his family and his job.  Historical Factors: Impulsivity  Risk Reduction Factors:   Responsible for children under 64 years of age, Sense of responsibility to family, Religious beliefs about death, Employed, Living with another person, especially a relative, Positive social support, Positive therapeutic relationship and Positive coping skills or problem solving skills  Continued Clinical Symptoms:  Bipolar Disorder:   Mixed State  Cognitive Features That Contribute To Risk:  Polarized thinking    Suicide Risk:  Minimal: No identifiable suicidal ideation.  Patients presenting with no risk factors but with morbid ruminations; may be classified as minimal risk based on the severity of the depressive symptoms  Follow-up [facility] Go on 01/22/2016.   Why:  Please arrive on Monday  August 21st at 4pm for your  hospital follow up for therapy Contact information: [address] [place], [place] [address] Ph: [phone] Fax: [phone]       Dr. Ryan-Psychiatry. Go on 02/07/2016.   Why:  3:30pm. Please call to reschedule if any  problem keeping appointment. Contact information: [address] [place], [place] [address] Ph: [phone] Fax: [phone] Please call before faxing to insure fax machine is operational and ofice is open.  Fax number is same as phone number so please call before faxing!          Plan Of Care/Follow-up recommendations:  Activity:  Activity as tolerated. Patient is realistic about his physical abilities and plans to gradually start having a little bit more activity and exercise as is appropriate. Diet:  Diet low carb heart healthy Tests:  Patient is aware that he is still on warfarin and will need to have blood tests regularly to monitor anticoagulation. He is aware that he is on lithium and Tegretol both of which are likely to require blood tests in the future. Other:  Patient is fully agreeable to following up with Dr. Thurmond Butts for outpatient psychiatric care and with his primary care and other medical doctors. He is aware that if rehospitalization or further ECT is deemed to be appropriate or necessary that I am available and quite willing to be of help.  Alethia Berthold, MD 02/01/2016, 11:25 AM

## 2016-02-01 NOTE — Progress Notes (Signed)
Patient discharged from unit at [place] pm.  Patient denies SI/AVH/HI at this time.  Discharge instructions were explained and follow-up appointments explained and noted and patient voiced understanding.  Patient appeared to be in no distress at time of discharge.  Patient belongings returned and patient acknowledged receipt of items.  Patient discharged facility in the company of his wife and daughter.  Patient voiced no complaints or concerns at this time.

## 2016-02-01 NOTE — Progress Notes (Signed)
  [facility] Adult Case Management Discharge Plan :  Will you be returning to the same living situation after discharge:  Yes,  Patient will be returning home to live with his wife and daughter.  At discharge, do you have transportation home?: Yes,  wife Do you have the ability to pay for your medications: Yes,  Patient is employed and has insurance.   Release of information consent forms completed and in the chart;  Patient's signature needed at discharge.  Patient to Follow up at: [facility] Go on 02/19/2016.   Why:  Please arrive on Monday, September 18th at [facility] for your hospital follow up for therapy appointment. Contact information: [address] [place], [place] [address] Ph: [phone] Fax: [phone]       Dr. Ryan-Psychiatry. Go on 02/07/2016.   Why:  3:30pm. Please call to reschedule if any problem keeping appointment. Contact information: [address] [place], [place] [address] Ph: [phone] Fax: [phone] Please call before faxing to insure fax machine is operational and ofice is open.  Fax number is same as phone number so please call before faxing!         In addition to patient's psychiatric follow-up scheduled by CSW, Patient's wife scheduled PCP follow-up for patients diabetes and other medical health needs. Patient has the following PCP appointments:  02/09/2016 11:15 am [place] in [place]. 02/16/2016 2:20pm  [place] in [facility] (for Diabetes follow-up).  Next level of care provider has access to [place] and Suicide Prevention discussed: Yes,  wife and with patient.  Have you used any form of tobacco in the last 30 days? (Cigarettes, Smokeless Tobacco, Cigars, and/or Pipes): No  Has patient been referred to the Quitline?: N/A patient is not a smoker  Patient has been referred  for addiction treatment: N/A  Jolaine Click 02/01/2016, 12:55 PM

## 2016-02-01 NOTE — BHH Group Notes (Signed)
[place] LCSW Group Therapy  02/01/2016 12:14 PM  Type of Therapy:  Group Therapy  Participation Level:  Active  Participation Quality:  Appropriate and Attentive  Affect:  Appropriate  Cognitive:  Alert  Insight:  Developing/Improving  Engagement in Therapy:  Engaged  Modes of Intervention:  Activity, Discussion, Education and Support  Summary of Progress/Problems:Balance in life: Patients will discuss the concept of balance and how it looks and feels to be unbalanced. Pt will identify areas in their life that is unbalanced and ways to become more balanced. Pt stated he needs his family to maintain balance in his life. Pt also stated that he feels a lot better than he did when he first came to the hospital and that he looks forward to discharging today.    Cameron [name] G. [place], [place] 02/01/2016, 12:16 PM

## 2016-02-01 NOTE — Tx Team (Signed)
Interdisciplinary Treatment and Diagnostic Plan Update  02/01/2016 Time of Session: 11:00am Cameron [name] MRN: [id number]  Principal Diagnosis: Bipolar I disorder, current or most recent episode manic, with psychotic features ([place])  Secondary Diagnoses: Principal Problem:   Bipolar I disorder, current or most recent episode manic, with psychotic features ([place]) Active Problems:   Diabetes mellitus without complication ([place])   Hypercholesteremia   CKD (chronic kidney disease), stage III   Cellulitis of leg, right   Bipolar affective disorder, current episode manic ([place])   Current Medications:  Current Facility-Administered Medications  Medication Dose Route Frequency Provider Last Rate Last Dose  . acetaminophen (TYLENOL) tablet 650 mg  650 mg Oral Q6H PRN Gonzella Lex, MD   650 mg at 01/17/16 0536  . alum & mag hydroxide-simeth (MAALOX/MYLANTA) 200-200-20 MG/5ML suspension 30 mL  30 mL Oral Q4H PRN Gonzella Lex, MD      . atorvastatin (LIPITOR) tablet 40 mg  40 mg Oral q1800 Gonzella Lex, MD   40 mg at 01/31/16 1645  . carbamazepine (TEGRETOL) tablet 1,200 mg  1,200 mg Oral BID PC Gonzella Lex, MD   1,200 mg at 02/01/16 0907  . dextrose 5 % solution 250 mL  250 mL Intravenous Once Gonzella Lex, MD      . enalapril (VASOTEC) tablet 5 mg  5 mg Oral BID Gonzella Lex, MD   5 mg at 01/31/16 2127  . fenofibrate tablet 160 mg  160 mg Oral Daily Gonzella Lex, MD   160 mg at 02/01/16 [id number]  . glucagon (human recombinant) (GLUCAGEN) injection 1 mg  1 mg Intramuscular Once PRN Gonzella Lex, MD      . haloperidol lactate (HALDOL) injection 20 mg  20 mg Intramuscular Q6H PRN Gonzella Lex, MD      . insulin glargine (LANTUS) injection 30 Units  30 Units Subcutaneous QHS Gonzella Lex, MD   30 Units at 01/31/16 2131  . lamoTRIgine (LAMICTAL) tablet 200 mg  200 mg Oral Daily Gonzella Lex, MD   200 mg at 02/01/16 0907  . lithium carbonate capsule 900 mg  900 mg Oral QHS Gonzella Lex, MD   900 mg at 01/31/16 2127  . LORazepam (ATIVAN) injection 2 mg  2 mg Intramuscular Q6H PRN Gonzella Lex, MD      . LORazepam (ATIVAN) tablet 1 mg  1 mg Oral Q4H PRN Rainey [place], MD   1 mg at 01/17/16 0545  . magnesium hydroxide (MILK OF MAGNESIA) suspension 30 mL  30 mL Oral Daily PRN Gonzella Lex, MD      . midazolam (VERSED) injection 2 mg  2 mg Intravenous Once Gonzella Lex, MD      . multivitamin with minerals tablet 1 tablet  1 tablet Oral Daily Gonzella Lex, MD   1 tablet at 02/01/16 [id number]  . QUEtiapine (SEROQUEL) tablet 1,400 mg  1,400 mg Oral QHS Gonzella Lex, MD   1,400 mg at 01/31/16 2127  . temazepam (RESTORIL) capsule 15 mg  15 mg Oral QHS Gonzella Lex, MD   15 mg at 01/31/16 2127  . warfarin (COUMADIN) tablet 7.5 mg  7.5 mg Oral q1800 Gonzella Lex, MD   7.5 mg at 01/31/16 1645  . Warfarin - Pharmacist Dosing Inpatient   Does not apply [id number] Gonzella Lex, MD       PTA Medications: Prescriptions Prior to Admission  Medication Sig Dispense Refill  Last Dose  . amoxicillin-clavulanate (AUGMENTIN) 875-125 MG tablet Take 1 tablet by mouth 2 (two) times daily. X 7 more days 14 tablet 0 01/14/2016  . ARIPiprazole (ABILIFY) 30 MG tablet Take 30 mg by mouth every evening.    01/14/2016  . atorvastatin (LIPITOR) 40 MG tablet Take 40 mg by mouth daily.  11 01/14/2016  . carbamazepine (TEGRETOL) 200 MG tablet Take 2 tablets (400 mg total) by mouth every morning. 30 tablet 0 01/14/2016  . carbamazepine (TEGRETOL) 200 MG tablet Take 3 tablets (600 mg total) by mouth every evening. 30 tablet 0 01/14/2016  . enalapril (VASOTEC) 5 MG tablet Take 5 mg by mouth 2 (two) times daily.   01/29/2016 at 0843  . fenofibrate 160 MG tablet Take 160 mg by mouth daily.   01/14/2016  . insulin aspart (NOVOLOG) 100 UNIT/ML injection Inject 0-9 Units into the skin 3 (three) times daily with meals. 10 mL 11 01/14/2016  . insulin aspart (NOVOLOG) 100 UNIT/ML injection Inject 0-5 Units into the skin  at bedtime. 10 mL 11 01/14/2016  . Insulin Glargine (LANTUS SOLOSTAR) 100 UNIT/ML Solostar Pen Inject 60 Units into the skin daily.    01/14/2016  . lamoTRIgine (LAMICTAL) 100 MG tablet Take 400 mg by mouth at bedtime.   01/14/2016  . Multiple Vitamin (MULTIVITAMIN WITH MINERALS) TABS tablet Take 1 tablet by mouth daily.   01/14/2016  . QUEtiapine (SEROQUEL) 400 MG tablet Take 3 tablets (1,200 mg total) by mouth at bedtime. 90 tablet 0 01/14/2016  . TANZEUM 50 MG PEN Inject 50 mg into the skin every 7 (seven) days.  2 01/14/2016  . temazepam (RESTORIL) 15 MG capsule Take 15 mg by mouth at bedtime as needed for sleep.   01/14/2016  . warfarin (COUMADIN) 7.5 MG tablet Take 1 tablet (7.5 mg total) by mouth daily at 6 PM. 30 tablet 0 01/14/2016    Treatment Modalities: Medication Management, Group therapy, Case management,  1 to 1 session with clinician, Psychoeducation, Recreational therapy.   Physician Treatment Plan for Primary Diagnosis: Bipolar I disorder, current or most recent episode manic, with psychotic features ([place]) Long Term Goal(s): Improvement in symptoms so as ready for discharge   Short Term Goals: Ability to demonstrate self-control will improve  Medication Management: Evaluate patient's response, side effects, and tolerance of medication regimen.  Therapeutic Interventions: 1 to 1 sessions, Unit Group sessions and Medication administration.  Evaluation of Outcomes: Adequate for Discharge  Physician Treatment Plan for Secondary Diagnosis: Principal Problem:   Bipolar I disorder, current or most recent episode manic, with psychotic features ([place]) Active Problems:   Diabetes mellitus without complication ([place])   Hypercholesteremia   CKD (chronic kidney disease), stage III   Cellulitis of leg, right   Bipolar affective disorder, current episode manic ([place])  Long Term Goal(s): Improvement in symptoms so as ready for discharge  Short Term Goals: Ability to identify changes in  lifestyle to reduce recurrence of condition will improve, Ability to demonstrate self-control will improve and Ability to maintain clinical measurements within normal limits will improve  Medication Management: Evaluate patient's response, side effects, and tolerance of medication regimen.  Therapeutic Interventions: 1 to 1 sessions, Unit Group sessions and Medication administration.  Evaluation of Outcomes: Adequate for Discharge   RN Treatment Plan for Primary Diagnosis: Bipolar I disorder, current or most recent episode manic, with psychotic features ([place]) Long Term Goal(s): Knowledge of disease and therapeutic regimen to maintain health will improve  Short Term Goals: Ability to  remain free from injury will improve, Ability to verbalize frustration and anger appropriately will improve, Ability to demonstrate self-control, Ability to participate in decision making will improve, Ability to verbalize feelings will improve, Ability to disclose and discuss suicidal ideas, Ability to identify and develop effective coping behaviors will improve and Compliance with prescribed medications will improve  Medication Management: RN will administer medications as ordered by provider, will assess and evaluate patient's response and provide education to patient for prescribed medication. RN will report any adverse and/or side effects to prescribing provider.  Therapeutic Interventions: 1 on 1 counseling sessions, Psychoeducation, Medication administration, Evaluate responses to treatment, Monitor vital signs and CBGs as ordered, Perform/monitor CIWA, COWS, AIMS and Fall Risk screenings as ordered, Perform wound care treatments as ordered.  Evaluation of Outcomes: Adequate for Discharge   LCSW Treatment Plan for Primary Diagnosis: Bipolar I disorder, current or most recent episode manic, with psychotic features ([place]) Long Term Goal(s): Safe transition to appropriate next level of care at discharge, Engage  patient in therapeutic group addressing interpersonal concerns.  Short Term Goals: Engage patient in aftercare planning with referrals and resources, Increase ability to appropriately verbalize feelings, Increase emotional regulation and Increase skills for wellness and recovery  Therapeutic Interventions: Assess for all discharge needs, 1 to 1 time with Social worker, Explore available resources and support systems, Assess for adequacy in community support network, Educate family and significant other(s) on suicide prevention, Complete Psychosocial Assessment, Interpersonal group therapy.  Evaluation of Outcomes: Adequate for Discharge    Progress in Treatment: Attending groups: Yes. Participating in groups: Yes. Taking medication as prescribed: Yes. Toleration medication: Yes. Family/Significant other contact made: Yes, individual(s) contacted:  wife Patient understands diagnosis: Yes. Discussing patient identified problems/goals with staff: Yes. Medical problems stabilized or resolved: Yes. Denies suicidal/homicidal ideation: Yes. Issues/concerns per patient self-inventory: No. Other: n/a  New problem(s) identified: n/a  New Short Term/Long Term Goal(s):  n/a  Discharge Plan or Barriers: Patient will discharge  Home with wife and will have psychiatric follow-up with Dr. Thurmond Butts and [facility] Follow-up detailed in discharge summary.   Reason for Continuation of Hospitalization: Other; describe Patient will be discharging 02/01/2016  Estimated Length of Stay: Discharge 02/01/2016  Attendees: Patient:Cameron [name] 02/01/2016 11:06 AM  Physician: Dr. Weber Cooks, MD 02/01/2016 11:06 AM  Nursing: Elige Radon, RN 02/01/2016 11:06 AM  RN Care Manager: 02/01/2016 11:06 AM  Social Worker: Lear Ng. Claybon Jabs MSW, [place] 02/01/2016 11:06 AM  Recreational Therapist:  02/01/2016 11:06 AM  Other:  02/01/2016 11:06 AM  Other:  02/01/2016 11:06 AM  Other: 02/01/2016 11:06  AM    Scribe for Treatment Team: Jolaine Click, LCSWA 02/01/2016 11:19 AM

## 2016-02-01 NOTE — BHH Group Notes (Signed)
[place] LCSW Group Therapy Note  Type of Therapy and Topic:  Group Therapy:  Goals Group: SMART Goals  Participation Level:  Patient attended group on this date. Patient participated in goal setting and was able to share openly with the group.   Description of Group:   The purpose of a daily goals group is to assist and guide patients in setting recovery/wellness-related goals.  The objective is to set goals as they relate to the crisis in which they were admitted. Patients will be using SMART goal modalities to set measurable goals.  Characteristics of realistic goals will be discussed and patients will be assisted in setting and processing how one will reach their goal. Facilitator will also assist patients in applying interventions and coping skills learned in psycho-education groups to the SMART goal and process how one will achieve defined goal.  Therapeutic Goals: -Patients will develop and document one goal related to or their crisis in which brought them into treatment. -Patients will be guided by LCSW using SMART goal setting modality in how to set a measurable, attainable, realistic and time sensitive goal.  -Patients will process barriers in reaching goal. -Patients will process interventions in how to overcome and successful in reaching goal.   Summary of Patient Progress:  Patient Goal: Patient stated his goal was to take his medications when he discharges and communicate with his wife about when he will be able to return to work.    Therapeutic Modalities:   Motivational Interviewing Public relations account executive Therapy Crisis Intervention Model SMART goals setting  Shakiya Mcneary G. [place], [facility] 02/01/2016 12:14 PM

## 2016-02-01 NOTE — Plan of Care (Signed)
Problem: Coping: Goal: Ability to demonstrate self-control will improve Outcome: Adequate for Discharge Mood instability limited, appears to functioning well, less intrusive, less animated, sober, thoughts more organized and less tangential in conversations, showing empathy for others.

## 2016-02-01 NOTE — Progress Notes (Signed)
Medications administered as ordered by the physician, medications Therapeutic Effects, SEs and Adverse effects discussed, questions encouraged; no PRN given, 15 minute checks maintained for safety, clinical and moral support provided, patient encouraged to continue to express feelings and demonstrate safe care. Patient remains free from harm, will continue to monitor.

## 2016-04-01 ENCOUNTER — Other Ambulatory Visit: Payer: Self-pay | Admitting: Psychiatry

## 2016-06-11 DIAGNOSIS — Z7901 Long term (current) use of anticoagulants: Secondary | ICD-10-CM | POA: Diagnosis not present

## 2016-06-17 DIAGNOSIS — Z7901 Long term (current) use of anticoagulants: Secondary | ICD-10-CM | POA: Diagnosis not present

## 2016-07-18 DIAGNOSIS — I10 Essential (primary) hypertension: Secondary | ICD-10-CM | POA: Diagnosis not present

## 2016-07-18 DIAGNOSIS — Z7901 Long term (current) use of anticoagulants: Secondary | ICD-10-CM | POA: Diagnosis not present

## 2016-08-08 DIAGNOSIS — Z7901 Long term (current) use of anticoagulants: Secondary | ICD-10-CM | POA: Diagnosis not present

## 2016-08-08 DIAGNOSIS — E870 Hyperosmolality and hypernatremia: Secondary | ICD-10-CM | POA: Diagnosis not present

## 2016-08-22 DIAGNOSIS — Z7901 Long term (current) use of anticoagulants: Secondary | ICD-10-CM | POA: Diagnosis not present

## 2016-09-10 DIAGNOSIS — Z7901 Long term (current) use of anticoagulants: Secondary | ICD-10-CM | POA: Diagnosis not present

## 2016-11-08 DIAGNOSIS — Z7901 Long term (current) use of anticoagulants: Secondary | ICD-10-CM | POA: Diagnosis not present

## 2016-12-09 IMAGING — CT CT ANGIO CHEST
3 of 7 series · 18 of 36 positions shown · IV contrast (ISOVUE 370)
Comparison: Chest radiograph performed earlier today at [DATE] a.m.

CLINICAL DATA: Acute onset of hemoptysis after spraying [place] for bugs. Elevated D-dimer. Initial encounter.

EXAM:
CT ANGIOGRAPHY CHEST WITH CONTRAST
TECHNIQUE: Multidetector CT imaging of the chest was performed using the
standard protocol during bolus administration of intravenous
contrast. Multiplanar CT image reconstructions and MIPs were
obtained to evaluate the vascular anatomy.
CONTRAST:  100 mL of Isovue 370 IV contrast

[Series 6: thins for pacs · axial · 0.78mm/px · z∈[+1322,+1544]mm · 15 of 254 slices shown]
[im 16/254  lung]
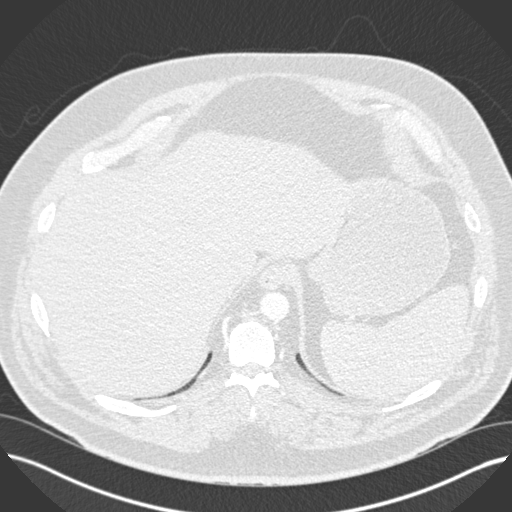
[im 32/254  mediastinal]
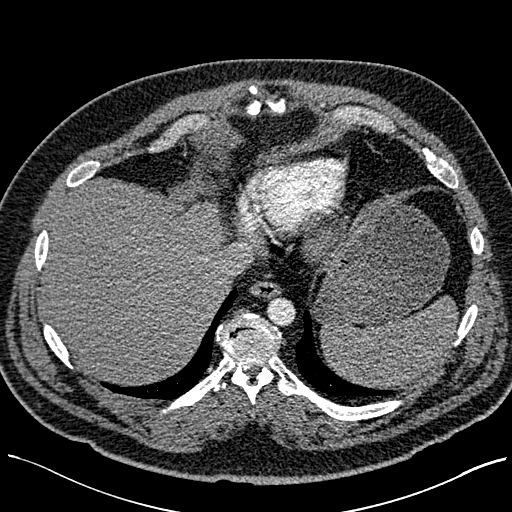
[im 48/254  lung]
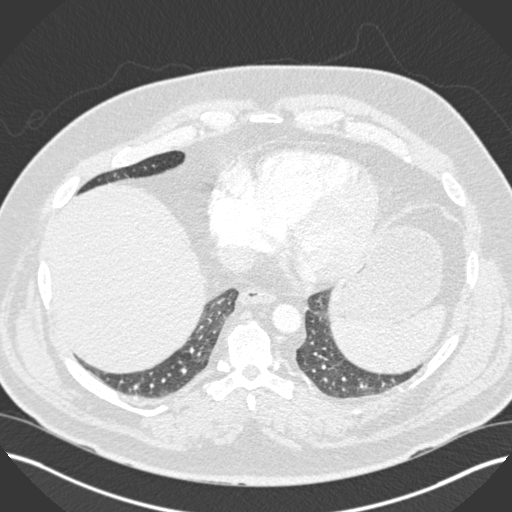
[im 64/254  mediastinal]
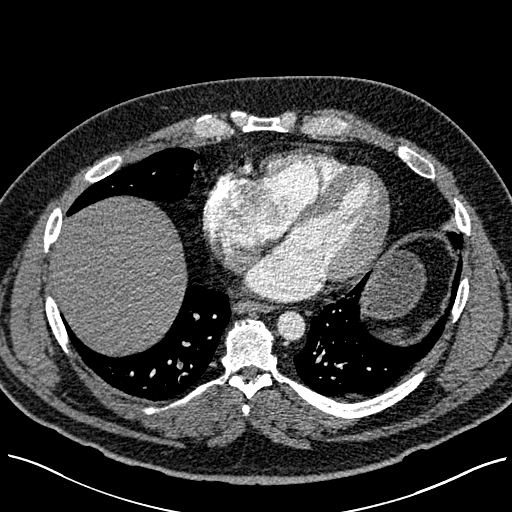
[im 80/254  lung]
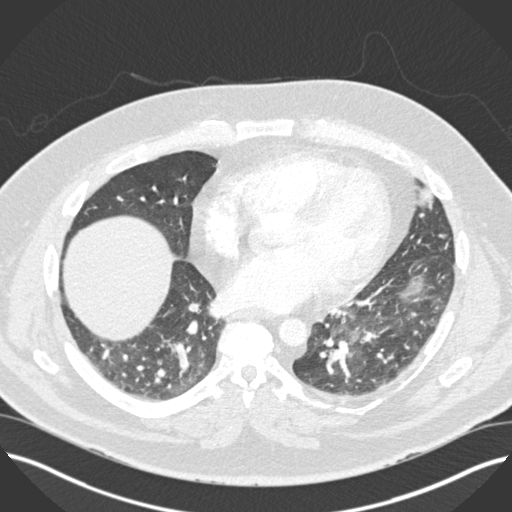
[im 95/254  mediastinal]
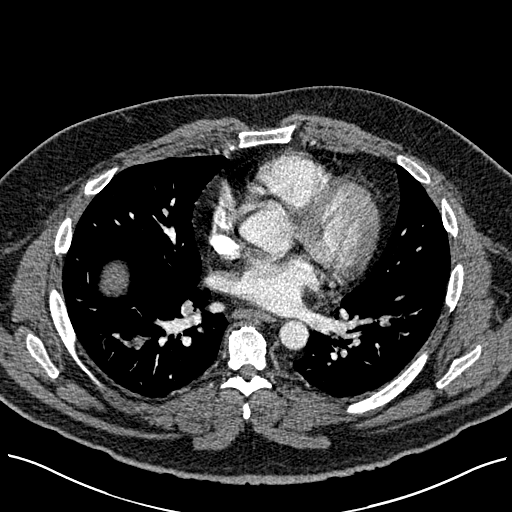
[im 111/254  lung]
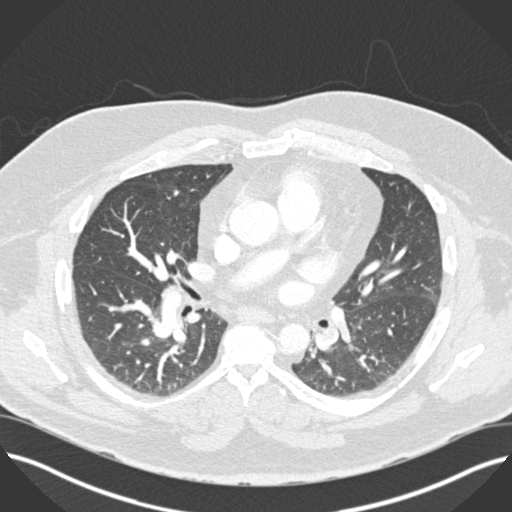
[im 127/254  mediastinal]
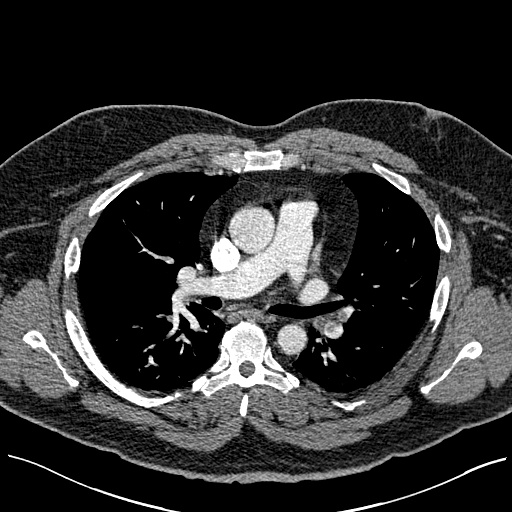
[im 143/254  lung]
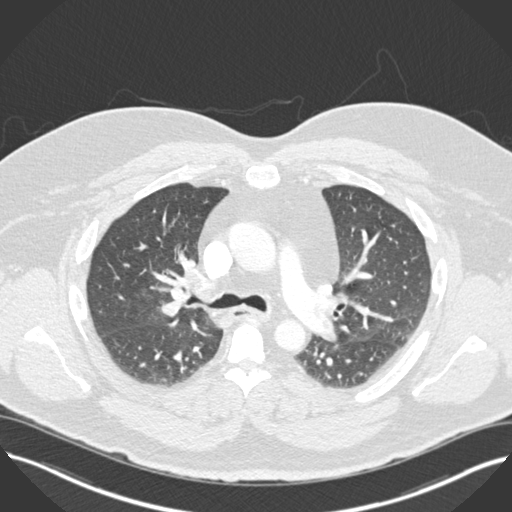
[im 159/254  mediastinal]
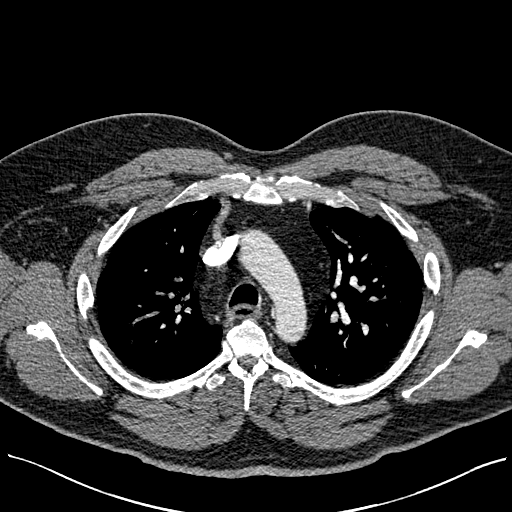
[im 174/254  lung]
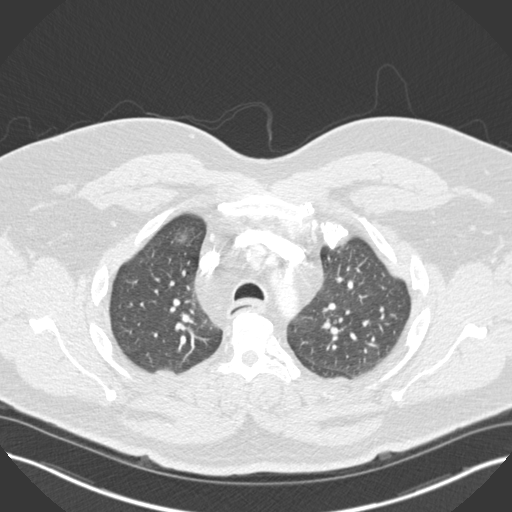
[im 190/254  mediastinal]
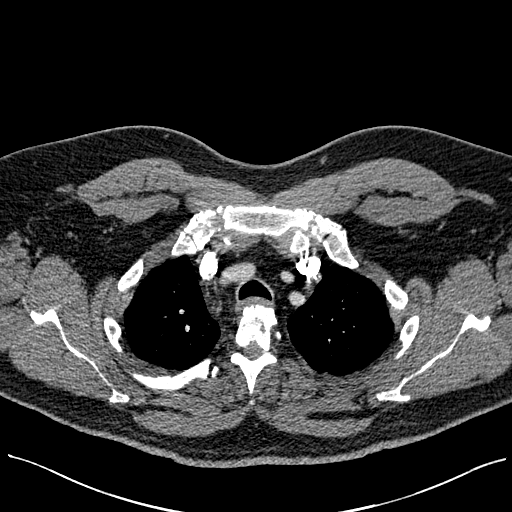
[im 206/254  lung]
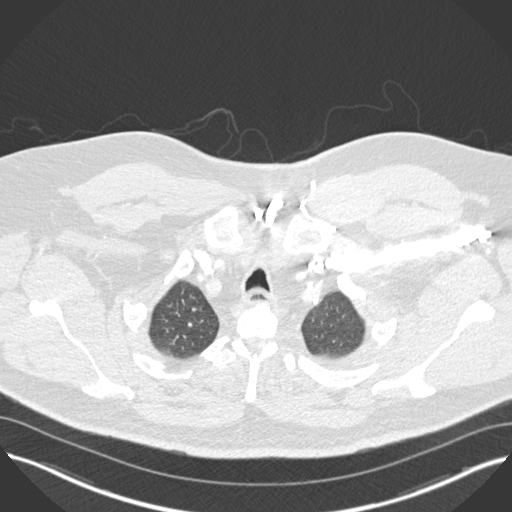
[im 222/254  mediastinal]
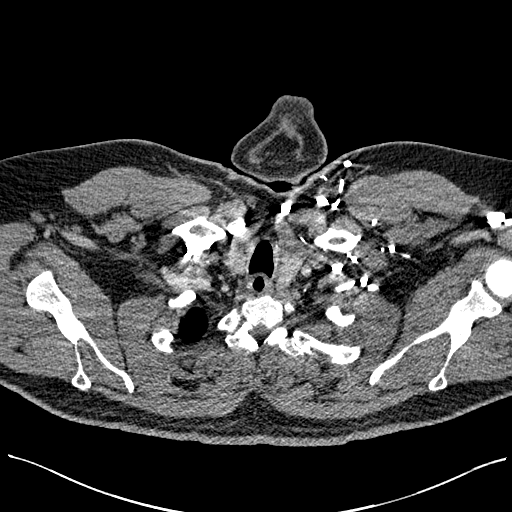
[im 238/254  lung]
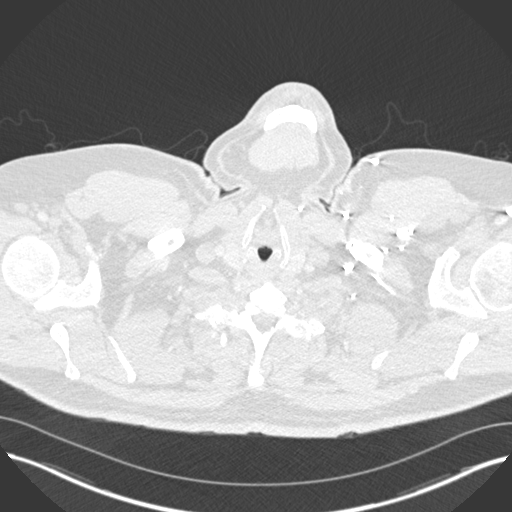

[Series 7: lung windows · axial · 0.78mm/px · z∈[+1380,+1440]mm · 2 of 81 slices shown]
[im 21/81  mediastinal]
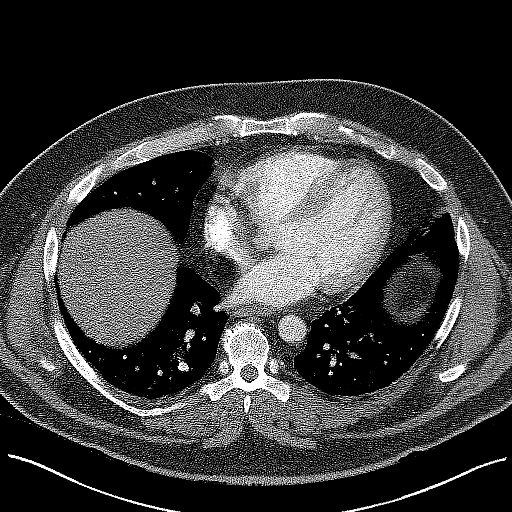
[im 41/81  mediastinal]
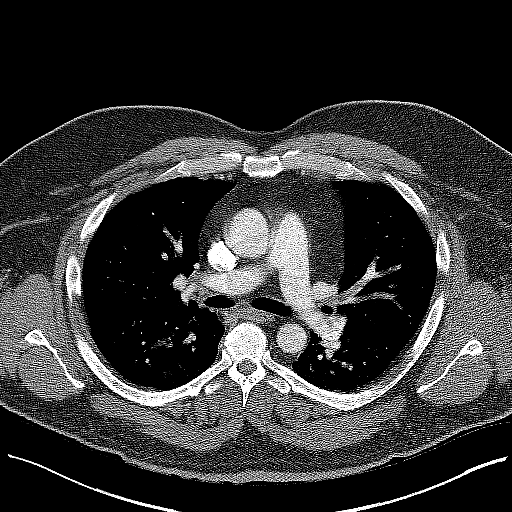

[Series 8: coronal mpr · coronal · 0.49mm/px · 1 of 148 slices shown]
[im 74/148  mediastinal]
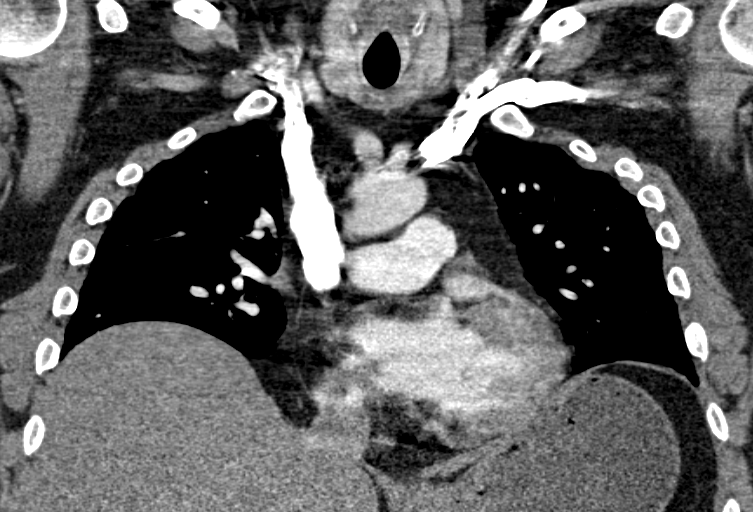

[18 of 36 positions shown; findings below may reference images not displayed]

FINDINGS: Scattered pulmonary emboli are noted within the left main pulmonary
artery, and within segmental pulmonary arteries on the right. These
extend distally into subsegmental branches bilaterally, within all
lobes of both lungs. The RV/LV ratio of 1.1 corresponds to right
heart strain and at least submassive pulmonary embolus.

Mild bibasilar atelectasis is noted. The lungs are otherwise clear.
There is no evidence of significant focal consolidation, pleural
effusion or pneumothorax. No masses are identified; no abnormal
focal contrast enhancement is seen.

The mediastinum is otherwise grossly unremarkable. No mediastinal
lymphadenopathy is seen. No pericardial effusion is identified. The
great vessels are grossly unremarkable in appearance. No axillary
lymphadenopathy is seen. The visualized portions of the thyroid
gland are unremarkable in appearance.

The visualized portions of the liver and spleen are unremarkable.

No acute osseous abnormalities are seen.

Review of the MIP images confirms the above findings.
IMPRESSION: 1. Scattered pulmonary emboli within the left main pulmonary artery,
and within segmental pulmonary arteries on the right, extending
distally into subsegmental branches in all lobes of both lungs. CT
evidence of right heart strain (RV/LV Ratio = 1.1) consistent with
at least submassive (intermediate risk) PE. The presence of right
heart strain has been associated with an increased risk of morbidity
and mortality. Please activate Code PE by paging [phone].
2. Mild bibasilar atelectasis noted.  Lungs otherwise clear.
Critical Value/emergent results were called by telephone at the time
of interpretation on 12/04/2015 at [DATE] to Dr. FABRIZIO MUI, who
verbally acknowledged these results.

## 2016-12-13 DIAGNOSIS — H40013 Open angle with borderline findings, low risk, bilateral: Secondary | ICD-10-CM | POA: Diagnosis not present

## 2017-01-24 DIAGNOSIS — E119 Type 2 diabetes mellitus without complications: Secondary | ICD-10-CM | POA: Diagnosis not present

## 2017-01-24 DIAGNOSIS — I10 Essential (primary) hypertension: Secondary | ICD-10-CM | POA: Diagnosis not present

## 2017-01-24 DIAGNOSIS — Z7901 Long term (current) use of anticoagulants: Secondary | ICD-10-CM | POA: Diagnosis not present

## 2017-01-31 DIAGNOSIS — Z7901 Long term (current) use of anticoagulants: Secondary | ICD-10-CM | POA: Diagnosis not present

## 2017-01-31 DIAGNOSIS — E119 Type 2 diabetes mellitus without complications: Secondary | ICD-10-CM | POA: Diagnosis not present

## 2017-02-14 DIAGNOSIS — Z7901 Long term (current) use of anticoagulants: Secondary | ICD-10-CM | POA: Diagnosis not present

## 2017-03-03 DIAGNOSIS — J0100 Acute maxillary sinusitis, unspecified: Secondary | ICD-10-CM | POA: Diagnosis not present

## 2017-03-03 DIAGNOSIS — J069 Acute upper respiratory infection, unspecified: Secondary | ICD-10-CM | POA: Diagnosis not present

## 2017-03-03 DIAGNOSIS — Z7901 Long term (current) use of anticoagulants: Secondary | ICD-10-CM | POA: Diagnosis not present

## 2017-03-18 DIAGNOSIS — H40053 Ocular hypertension, bilateral: Secondary | ICD-10-CM | POA: Diagnosis not present

## 2017-03-26 ENCOUNTER — Telehealth: Payer: Self-pay | Admitting: Hematology

## 2017-03-26 NOTE — Telephone Encounter (Signed)
Appt has been switched for the pt to see Dr. Burr Medico instead of Dr. Alen Blew. Pt preferred Friday appts. Pt to see Dr. Burr Medico on 10/26 at 230pm. Pt aware of new appt date and time.

## 2017-03-28 ENCOUNTER — Ambulatory Visit (HOSPITAL_BASED_OUTPATIENT_CLINIC_OR_DEPARTMENT_OTHER): Payer: 59 | Admitting: Hematology

## 2017-03-28 ENCOUNTER — Encounter: Payer: Self-pay | Admitting: Hematology

## 2017-03-28 ENCOUNTER — Encounter: Payer: Self-pay | Admitting: Oncology

## 2017-03-28 VITALS — BP 139/72 | HR 82 | Temp 98.7°F | Resp 20 | Ht 72.0 in | Wt 328.0 lb

## 2017-03-28 DIAGNOSIS — I2609 Other pulmonary embolism with acute cor pulmonale: Secondary | ICD-10-CM

## 2017-03-28 DIAGNOSIS — I10 Essential (primary) hypertension: Secondary | ICD-10-CM | POA: Diagnosis not present

## 2017-03-28 DIAGNOSIS — I2699 Other pulmonary embolism without acute cor pulmonale: Secondary | ICD-10-CM

## 2017-03-28 DIAGNOSIS — E119 Type 2 diabetes mellitus without complications: Secondary | ICD-10-CM

## 2017-03-28 DIAGNOSIS — I82401 Acute embolism and thrombosis of unspecified deep veins of right lower extremity: Secondary | ICD-10-CM

## 2017-03-28 DIAGNOSIS — E669 Obesity, unspecified: Secondary | ICD-10-CM

## 2017-03-28 DIAGNOSIS — F319 Bipolar disorder, unspecified: Secondary | ICD-10-CM

## 2017-03-28 NOTE — Progress Notes (Signed)
[place]  [phone] Fax:([phone]) [place] consult Note   Patient Care Team: [name], MD as PCP - General (Family Medicine) 03/28/2017  Referring physician: [name], MD  CHIEF COMPLAINTS/PURPOSE OF CONSULTATION:  Pulmonary Embolism  HISTORY OF PRESENTING ILLNESS: 03/28/17  Cameron [name] 51 y.o. male is here because of Pulmonary Embolism. He was referred by Dr. Dorthy Cooler his PCP.  He presents to the clinic today by himself.   In 12/2015 he had right leg DVT and he was gurgling blood because the clot traveled to lung and caused a PE. He denied any chest pain at that time. Prior that time he had pain in right knee for 6 months. He assumed he twisted his knee but is not sure what happened. He was not aware of any swelling in his right leg. He was discharged with Coumadin and has been on it since. His leg pain is fine now and notes no repeated symptoms. He gets his coumadin checks with his PCP every 2 months. He denies any present bleeding.   He has been in and out of hospital in the past due to his bipolar episodes. When a big event occurs his bipolar episodes are triggered. He has HTN and DM. He says he has not been exercising but does take his medication and has a regular diet.  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 He lives with his wife and daughter. He does not smoke or drink. His mother had a stroke but no family history of blood clots. He drinks plenty of water.  He sees Dr. Buddy Duty his endocrinologist and Dr. Thurmond Butts his Psychologist. He provided his updated medication list.   Today he notes hitting his anterior lower leg and "knicked" himself. He bled more due to his coumadin. He also noted a discoloration on his anterior leg after rash reaction last year.    He denies recent chest pain on exertion, shortness of breath on minimal exertion, pre-syncopal episodes, or palpitations. He had not noticed any  recent bleeding such as epistaxis, hematuria or hematochezia He had no prior history or diagnosis of cancer. His age appropriate screening programs are up-to-date.    MEDICAL HISTORY:  Past Medical History:  Diagnosis Date  . Bipolar 1 disorder ([place])   . CKD (chronic kidney disease), stage III   . Diabetes mellitus without complication ([place])   . DVT (deep venous thrombosis) ([place])   . Hypercholesteremia   . Hypertension     SURGICAL HISTORY: Past Surgical History:  Procedure Laterality Date  . APPENDECTOMY      SOCIAL HISTORY: Social History   Social History  . Marital status: Married    Spouse name: N/A  . Number of children: N/A  . Years of education: N/A   Occupational History  . Not on file.   Social History Main Topics  . Smoking status: Never Smoker  . Smokeless tobacco: Never Used  . Alcohol use Yes     Comment: rarely  . Drug use: No  . Sexual activity: Not on file   Other Topics Concern  . Not on file   Social History Narrative  . No narrative on file    FAMILY HISTORY: Family History  Problem Relation Age of Onset  . Stroke Other   . Diabetes Other     ALLERGIES:  is allergic to asa [aspirin].  MEDICATIONS:  Current Outpatient Prescriptions  Medication Sig Dispense Refill  .  atorvastatin (LIPITOR) 40 MG tablet Take 1 tablet (40 mg total) by mouth daily at 6 PM. 30 tablet 1  . carbamazepine (TEGRETOL) 200 MG tablet Take 6 tablets (1,200 mg total) by mouth 2 (two) times daily after a meal. 360 tablet 1  . enalapril (VASOTEC) 5 MG tablet Take 1 tablet (5 mg total) by mouth 2 (two) times daily. 30 tablet 1  . fenofibrate 160 MG tablet Take 1 tablet (160 mg total) by mouth daily. 30 tablet 1  . insulin glargine (LANTUS) 100 UNIT/ML injection Inject 0.3 mLs (30 Units total) into the skin at bedtime. 10 mL 11  . lamoTRIgine (LAMICTAL) 200 MG tablet Take 1 tablet (200 mg total) by mouth daily. 30 tablet 1  . lithium carbonate 300 MG capsule Take 3  capsules (900 mg total) by mouth at bedtime. 90 capsule 1  . Multiple Vitamin (MULTIVITAMIN WITH MINERALS) TABS tablet Take 1 tablet by mouth daily. 30 tablet 1  . QUEtiapine (SEROQUEL) 200 MG tablet Take 7 tablets (1,400 mg total) by mouth at bedtime. 210 tablet 1  . temazepam (RESTORIL) 15 MG capsule Take 1 capsule (15 mg total) by mouth at bedtime. 30 capsule 1  . warfarin (COUMADIN) 7.5 MG tablet Take 1 tablet (7.5 mg total) by mouth daily at 6 PM. 30 tablet 1   No current facility-administered medications for this visit.     REVIEW OF SYSTEMS:   Constitutional: Denies fevers, chills or abnormal night sweats Eyes: Denies blurriness of vision, double vision or watery eyes Ears, nose, mouth, throat, and face: Denies mucositis or sore throat Respiratory: Denies cough, dyspnea or wheezes Cardiovascular: Denies palpitation, chest discomfort or lower extremity swelling Gastrointestinal:  Denies nausea, heartburn or change in bowel habits Skin: Denies abnormal skin rashes (+) Bruise, cut and discoloration on anterior lower right leg.  Lymphatics: Denies new lymphadenopathy or easy bruising Neurological:Denies numbness, tingling or new weaknesses Behavioral/Psych: Mood is stable, no new changes  All other systems were reviewed with the patient and are negative.  PHYSICAL EXAMINATION: ECOG PERFORMANCE STATUS: 0 - Asymptomatic  Vitals:   03/28/17 1507  BP: 139/72  Pulse: 82  Resp: 20  Temp: 98.7 F (37.1 C)  SpO2: 97%   Filed Weights   03/28/17 1507  Weight: (!) 328 lb (148.8 kg)    GENERAL:alert, no distress and comfortable SKIN: skin color, texture, turgor are normal, no rashes or significant lesions EYES: normal, conjunctiva are pink and non-injected, sclera clear OROPHARYNX:no exudate, no erythema and lips, buccal mucosa, and tongue normal  NECK: supple, thyroid normal size, non-tender, without nodularity LYMPH:  no palpable lymphadenopathy in the cervical, axillary or  inguinal LUNGS: clear to auscultation and percussion with normal breathing effort HEART: regular rate & rhythm and no murmurs and no lower extremity edema ABDOMEN:abdomen soft, non-tender and normal bowel sounds Musculoskeletal:no cyanosis of digits and no clubbing  PSYCH: alert & oriented x 3 with fluent speech NEURO: no focal motor/sensory deficits  LABORATORY DATA:  I have reviewed the data as listed CBC Latest Ref Rng & Units 01/31/2016 01/27/2016 01/23/2016  WBC 3.8 - 10.6 K/uL 3.1(L) 2.8(L) 2.4(L)  Hemoglobin 13.0 - 18.0 g/dL 12.4(L) 12.2(L) 12.3(L)  Hematocrit 40.0 - 52.0 % 36.4(L) 36.0(L) 36.4(L)  Platelets 150 - 440 K/uL 201 197 199    CMP Latest Ref Rng & Units 01/04/2016 01/03/2016 01/02/2016  Glucose 65 - 99 mg/dL 90 90 121(H)  BUN 6 - 20 mg/dL 26(H) 28(H) 26(H)  Creatinine 0.61 - 1.24 mg/dL  1.60(H) 1.76(H) 1.71(H)  Sodium 135 - 145 mmol/L 143 139 138  Potassium 3.5 - 5.1 mmol/L 4.2 3.4(L) 3.6  Chloride 101 - 111 mmol/L 109 105 101  CO2 22 - 32 mmol/L 28 24 25   Calcium 8.9 - 10.3 mg/dL 8.9 9.0 9.0  Total Protein 6.5 - 8.1 g/dL - - 7.3  Total Bilirubin 0.3 - 1.2 mg/dL - - 0.8  Alkaline Phos 38 - 126 U/L - - 44  AST 15 - 41 U/L - - 109(H)  ALT 17 - 63 U/L - - 91(H)     RADIOGRAPHIC STUDIES: I have personally reviewed the radiological images as listed and agreed with the findings in the report. No results found.  ASSESSMENT & PLAN: Cameron [name] is a 51 y.o. male with a history of Bipolar Disorder 1, HTN, Type 2 DM, obesity, presented with right LE DVT and  b/l PE with prior history of 6 month right leg/knee pain    1. Pulmonary Embolism and right LE DVT, likely provoked  -He presented to hospital in 12/2015 for PE that resulted from extensive right leg DVTs. He had 6 months history of right leg pain, especially around knee, which resulted decreased mobility. This is likely proved thrombosis. -He has been on coumadin since then and he regularly checks coumadin every 2  months with PCP.  -12/04/15 labs were negative for hereditary hypercoagulation. He has no family history of thrombosis. No other high risk for thrombosis except his obesity.  -His right knee pain has resolved, he is back to his normal activities, although he is not very physically active. -He has been on Coumadin for over a year, which is probably adequate. Given the provoked thrombosis, I think it's okay to stop Coumadin, and switched to baby aspirin daily indefinitely. -Although his workup and family history was negative for hypercoagulopathy, he is at increased risk for recurrent thrombosis. I would recommend anticoagulation indefinitely if he develops second episode of thrombosis, he agrees  -I suggest he changes his lifestyle with managing his weight, being active and wear compression socks. He agrees. I also recommend him to continue to routine cancer screening, and do not smoke.  -He can stop Coumadin and proceed with baby Aspirin 81 mg.  -He should watch for swelling of his leg or pain in knee.  -f/u as needed     2. DM, Uncontrolled/ HTN/Obesity  - Followed by Dr. Buddy Duty   3. Bipolar Disorder 1 -followed by Dr. Thurmond Butts    PLAN:  -Send note to Dr. Dorthy Cooler about stopping Coumadin and starting baby Aspirin.  -F/u as needed in the future     No problem-specific Assessment & Plan notes found for this encounter.    All questions were answered. The patient knows to call the clinic with any problems, questions or concerns. I spent 30 minutes counseling the patient face to face. The total time spent in the appointment was 40 minutes and more than 50% was on counseling.     Truitt Merle, MD 03/28/2017  3:30 PM    This document serves as a record of services personally performed by Truitt Merle, MD. It was created on her behalf by Joslyn Devon, a trained medical scribe. The creation of this record is based on the scribe's personal observations and the provider's statements to them. This  document has been checked and approved by the attending provider.

## 2017-03-30 ENCOUNTER — Encounter: Payer: Self-pay | Admitting: Hematology

## 2017-04-14 DIAGNOSIS — Z794 Long term (current) use of insulin: Secondary | ICD-10-CM | POA: Diagnosis not present

## 2017-04-14 DIAGNOSIS — E1165 Type 2 diabetes mellitus with hyperglycemia: Secondary | ICD-10-CM | POA: Diagnosis not present

## 2017-05-08 DIAGNOSIS — E7800 Pure hypercholesterolemia, unspecified: Secondary | ICD-10-CM | POA: Diagnosis not present

## 2017-05-08 DIAGNOSIS — Z23 Encounter for immunization: Secondary | ICD-10-CM | POA: Diagnosis not present

## 2017-05-08 DIAGNOSIS — Z0000 Encounter for general adult medical examination without abnormal findings: Secondary | ICD-10-CM | POA: Diagnosis not present

## 2017-08-01 DIAGNOSIS — E1165 Type 2 diabetes mellitus with hyperglycemia: Secondary | ICD-10-CM | POA: Diagnosis not present

## 2017-08-01 DIAGNOSIS — Z23 Encounter for immunization: Secondary | ICD-10-CM | POA: Diagnosis not present

## 2017-08-01 DIAGNOSIS — Z794 Long term (current) use of insulin: Secondary | ICD-10-CM | POA: Diagnosis not present

## 2017-08-29 DIAGNOSIS — Z1211 Encounter for screening for malignant neoplasm of colon: Secondary | ICD-10-CM | POA: Diagnosis not present

## 2017-10-07 DIAGNOSIS — K640 First degree hemorrhoids: Secondary | ICD-10-CM | POA: Diagnosis not present

## 2017-10-07 DIAGNOSIS — D126 Benign neoplasm of colon, unspecified: Secondary | ICD-10-CM | POA: Diagnosis not present

## 2017-10-07 DIAGNOSIS — Z1211 Encounter for screening for malignant neoplasm of colon: Secondary | ICD-10-CM | POA: Diagnosis not present

## 2017-10-10 DIAGNOSIS — Z794 Long term (current) use of insulin: Secondary | ICD-10-CM | POA: Diagnosis not present

## 2017-10-10 DIAGNOSIS — E1165 Type 2 diabetes mellitus with hyperglycemia: Secondary | ICD-10-CM | POA: Diagnosis not present

## 2018-01-09 DIAGNOSIS — E1165 Type 2 diabetes mellitus with hyperglycemia: Secondary | ICD-10-CM | POA: Diagnosis not present

## 2018-01-09 DIAGNOSIS — Z794 Long term (current) use of insulin: Secondary | ICD-10-CM | POA: Diagnosis not present

## 2018-04-14 DIAGNOSIS — Z23 Encounter for immunization: Secondary | ICD-10-CM | POA: Diagnosis not present

## 2018-05-21 DIAGNOSIS — Z0000 Encounter for general adult medical examination without abnormal findings: Secondary | ICD-10-CM | POA: Diagnosis not present

## 2018-05-21 DIAGNOSIS — E7800 Pure hypercholesterolemia, unspecified: Secondary | ICD-10-CM | POA: Diagnosis not present

## 2018-06-10 DIAGNOSIS — Z794 Long term (current) use of insulin: Secondary | ICD-10-CM | POA: Diagnosis not present

## 2018-06-10 DIAGNOSIS — E1165 Type 2 diabetes mellitus with hyperglycemia: Secondary | ICD-10-CM | POA: Diagnosis not present

## 2018-06-10 DIAGNOSIS — R944 Abnormal results of kidney function studies: Secondary | ICD-10-CM | POA: Diagnosis not present

## 2018-06-17 DIAGNOSIS — R4789 Other speech disturbances: Secondary | ICD-10-CM | POA: Diagnosis not present

## 2018-06-17 DIAGNOSIS — R51 Headache: Secondary | ICD-10-CM | POA: Diagnosis not present

## 2018-06-18 DIAGNOSIS — E119 Type 2 diabetes mellitus without complications: Secondary | ICD-10-CM | POA: Diagnosis not present

## 2018-06-18 DIAGNOSIS — G479 Sleep disorder, unspecified: Secondary | ICD-10-CM | POA: Diagnosis not present

## 2018-06-19 DIAGNOSIS — E785 Hyperlipidemia, unspecified: Secondary | ICD-10-CM | POA: Diagnosis not present

## 2018-06-19 DIAGNOSIS — R51 Headache: Secondary | ICD-10-CM | POA: Diagnosis not present

## 2018-06-19 DIAGNOSIS — Z6841 Body Mass Index (BMI) 40.0 and over, adult: Secondary | ICD-10-CM | POA: Diagnosis not present

## 2018-06-19 DIAGNOSIS — L97919 Non-pressure chronic ulcer of unspecified part of right lower leg with unspecified severity: Secondary | ICD-10-CM | POA: Diagnosis not present

## 2018-06-19 DIAGNOSIS — I10 Essential (primary) hypertension: Secondary | ICD-10-CM | POA: Diagnosis not present

## 2018-06-19 DIAGNOSIS — I129 Hypertensive chronic kidney disease with stage 1 through stage 4 chronic kidney disease, or unspecified chronic kidney disease: Secondary | ICD-10-CM | POA: Diagnosis not present

## 2018-06-19 DIAGNOSIS — K0889 Other specified disorders of teeth and supporting structures: Secondary | ICD-10-CM | POA: Diagnosis not present

## 2018-06-19 DIAGNOSIS — G4733 Obstructive sleep apnea (adult) (pediatric): Secondary | ICD-10-CM | POA: Diagnosis not present

## 2018-06-19 DIAGNOSIS — R451 Restlessness and agitation: Secondary | ICD-10-CM | POA: Diagnosis not present

## 2018-06-19 DIAGNOSIS — E1122 Type 2 diabetes mellitus with diabetic chronic kidney disease: Secondary | ICD-10-CM | POA: Diagnosis not present

## 2018-06-19 DIAGNOSIS — G479 Sleep disorder, unspecified: Secondary | ICD-10-CM | POA: Diagnosis not present

## 2018-06-19 DIAGNOSIS — E119 Type 2 diabetes mellitus without complications: Secondary | ICD-10-CM | POA: Diagnosis not present

## 2018-06-19 DIAGNOSIS — S99912A Unspecified injury of left ankle, initial encounter: Secondary | ICD-10-CM | POA: Diagnosis not present

## 2018-06-19 DIAGNOSIS — N183 Chronic kidney disease, stage 3 (moderate): Secondary | ICD-10-CM | POA: Diagnosis not present

## 2018-06-19 DIAGNOSIS — G4700 Insomnia, unspecified: Secondary | ICD-10-CM | POA: Diagnosis not present

## 2018-08-26 DIAGNOSIS — E119 Type 2 diabetes mellitus without complications: Secondary | ICD-10-CM | POA: Diagnosis not present

## 2018-08-26 DIAGNOSIS — I10 Essential (primary) hypertension: Secondary | ICD-10-CM | POA: Diagnosis not present

## 2018-09-02 DIAGNOSIS — E119 Type 2 diabetes mellitus without complications: Secondary | ICD-10-CM | POA: Diagnosis not present

## 2018-09-02 DIAGNOSIS — I10 Essential (primary) hypertension: Secondary | ICD-10-CM | POA: Diagnosis not present

## 2019-03-09 ENCOUNTER — Other Ambulatory Visit: Payer: Self-pay | Admitting: Nephrology

## 2019-03-09 DIAGNOSIS — N183 Chronic kidney disease, stage 3 unspecified: Secondary | ICD-10-CM

## 2019-03-15 ENCOUNTER — Ambulatory Visit
Admission: RE | Admit: 2019-03-15 | Discharge: 2019-03-15 | Disposition: A | Discharge status: Home / Self Care | Payer: 59 | Source: Ambulatory Visit | Attending: Nephrology | Admitting: Nephrology

## 2019-03-15 DIAGNOSIS — N183 Chronic kidney disease, stage 3 unspecified: Secondary | ICD-10-CM

## 2019-07-02 ENCOUNTER — Ambulatory Visit (INDEPENDENT_AMBULATORY_CARE_PROVIDER_SITE_OTHER): Payer: 59 | Admitting: Otolaryngology

## 2019-07-02 ENCOUNTER — Other Ambulatory Visit: Payer: Self-pay

## 2019-07-02 ENCOUNTER — Encounter (INDEPENDENT_AMBULATORY_CARE_PROVIDER_SITE_OTHER): Payer: Self-pay | Admitting: Otolaryngology

## 2019-07-02 VITALS — Temp 97.7°F

## 2019-07-02 DIAGNOSIS — H6123 Impacted cerumen, bilateral: Secondary | ICD-10-CM

## 2019-07-02 NOTE — Progress Notes (Signed)
HPI: Cameron [name] is a 54 y.o. male who presents for evaluation of bilateral cerumen impaction.  He has been using some hydroperoxide recently and the left ear has become totally obstructed.  Denies any pain.  Has been several years since he has had his ears cleaned.  Past Medical History:  Diagnosis Date  . Bipolar 1 disorder ([place])   . CKD (chronic kidney disease), stage III   . Diabetes mellitus without complication ([place])   . DVT (deep venous thrombosis) ([place])   . Hypercholesteremia   . Hypertension    Past Surgical History:  Procedure Laterality Date  . APPENDECTOMY     Social History   Socioeconomic History  . Marital status: Married    Spouse name: Not on file  . Number of children: Not on file  . Years of education: Not on file  . Highest education level: Not on file  Occupational History  . Not on file  Tobacco Use  . Smoking status: Never Smoker  . Smokeless tobacco: Never Used  Substance and Sexual Activity  . Alcohol use: Yes    Comment: rarely  . Drug use: No  . Sexual activity: Not on file  Other Topics Concern  . Not on file  Social History Narrative  . Not on file   Social Determinants of Health   Financial Resource Strain:   . Difficulty of Paying Living Expenses: Not on file  Food Insecurity:   . Worried About Charity fundraiser in the Last Year: Not on file  . Ran Out of Food in the Last Year: Not on file  Transportation Needs:   . Lack of Transportation (Medical): Not on file  . Lack of Transportation (Non-Medical): Not on file  Physical Activity:   . Days of Exercise per Week: Not on file  . Minutes of Exercise per Session: Not on file  Stress:   . Feeling of Stress : Not on file  Social Connections:   . Frequency of Communication with Friends and Family: Not on file  . Frequency of Social Gatherings with Friends and Family: Not on file  . Attends Religious Services: Not on file  . Active Member of Clubs or Organizations: Not on file   . Attends Archivist Meetings: Not on file  . Marital Status: Not on file   Family History  Problem Relation Age of Onset  . Stroke Other   . Diabetes Other   . Stroke Mother    Allergies  Allergen Reactions  . Asa [Aspirin] Other (See Comments)    Patient tolerates LOW DOSE ASPIRIN.   Prior to Admission medications   Medication Sig Start Date End Date Taking? Authorizing Provider  ARIPiprazole (ABILIFY) 30 MG tablet Take 30 mg by mouth daily.   Yes [provider]  atorvastatin (LIPITOR) 40 MG tablet Take 1 tablet (40 mg total) by mouth daily at 6 PM. 02/01/16  Yes Clapacs, Madie Reno, MD  canagliflozin (INVOKANA) 100 MG TABS tablet Take 300 mg by mouth daily.   Yes [provider]  carbamazepine (TEGRETOL) 200 MG tablet Take 6 tablets (1,200 mg total) by mouth 2 (two) times daily after a meal. 02/01/16  Yes Clapacs, Madie Reno, MD  enalapril (VASOTEC) 5 MG tablet Take 1 tablet (5 mg total) by mouth 2 (two) times daily. 02/01/16  Yes Clapacs, Madie Reno, MD  fenofibrate 160 MG tablet Take 1 tablet (160 mg total) by mouth daily. 02/01/16  Yes Clapacs, Madie Reno, MD  insulin glargine (LANTUS) 100 UNIT/ML injection Inject 0.3 mLs (30 Units total) into the skin at bedtime. 02/01/16  Yes Clapacs, Madie Reno, MD  lamoTRIgine (LAMICTAL) 200 MG tablet Take 1 tablet (200 mg total) by mouth daily. 02/01/16  Yes Clapacs, Madie Reno, MD  Multiple Vitamin (MULTIVITAMIN WITH MINERALS) TABS tablet Take 1 tablet by mouth daily. 02/01/16  Yes Clapacs, Madie Reno, MD  QUEtiapine (SEROQUEL) 200 MG tablet Take 7 tablets (1,400 mg total) by mouth at bedtime. 02/01/16  Yes Clapacs, Madie Reno, MD  temazepam (RESTORIL) 15 MG capsule Take 1 capsule (15 mg total) by mouth at bedtime. 02/01/16  Yes Clapacs, Madie Reno, MD  warfarin (COUMADIN) 7.5 MG tablet Take 1 tablet (7.5 mg total) by mouth daily at 6 PM. 02/01/16  Yes Clapacs, Madie Reno, MD     Positive ROS: Otherwise negative  All other systems have been reviewed and  were otherwise negative with the exception of those mentioned in the HPI and as above.  Physical Exam: Constitutional: Alert, well-appearing, no acute distress Ears: External ears without lesions or tenderness. Ear canals reveal a large amount of impacted cerumen bilaterally.  There is cerumen was removed with forceps as well as curettes and suction.. Nasal: External nose without lesions. Clear nasal passages Oral: Oropharynx clear. Neck: No palpable adenopathy or masses Respiratory: Breathing comfortably  Skin: No facial/neck lesions or rash noted.  Cerumen impaction removal  Date/Time: 07/02/2019 5:04 PM Performed by: Rozetta Nunnery, MD Authorized by: Rozetta Nunnery, MD   Consent:    Consent obtained:  Verbal   Consent given by:  Patient   Risks discussed:  Pain and bleeding Procedure details:    Location:  L ear and R ear   Procedure type: curette, suction and forceps   Post-procedure details:    Inspection:  TM intact and canal normal   Hearing quality:  Improved   Patient tolerance of procedure:  Tolerated well, no immediate complications Comments:     Patient had a large amount of wax on both sides that was removed.  TMs were clear bilaterally.    Assessment: Bilateral cerumen impactions  Plan: He will follow-up as needed  Radene Journey, MD

## 2020-03-20 IMAGING — US US RENAL
1 series · 14 of 25 positions shown · non-contrast
Comparison: Ultrasound 04/06/2014

CLINICAL DATA: Stage 3 kidney disease

EXAM:
RENAL / URINARY TRACT ULTRASOUND COMPLETE

[Series 1: us renal · 0.28mm/px · 14 of 53 slices shown]
[im 1/53]
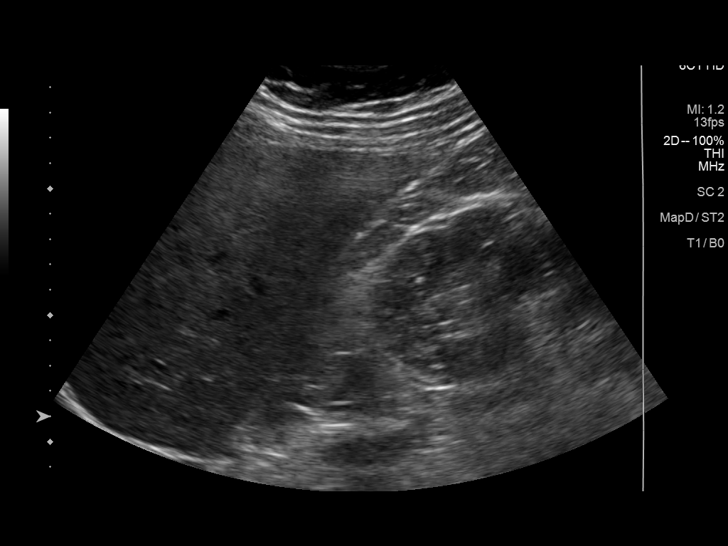
[im 5/53]
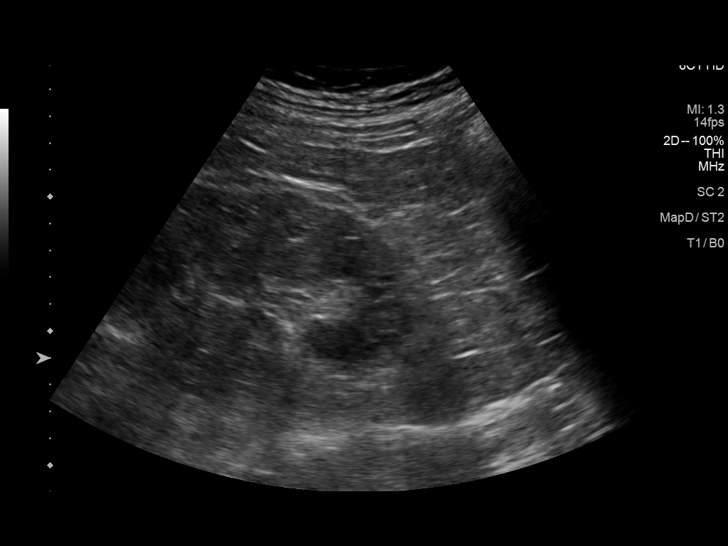
[im 9/53]
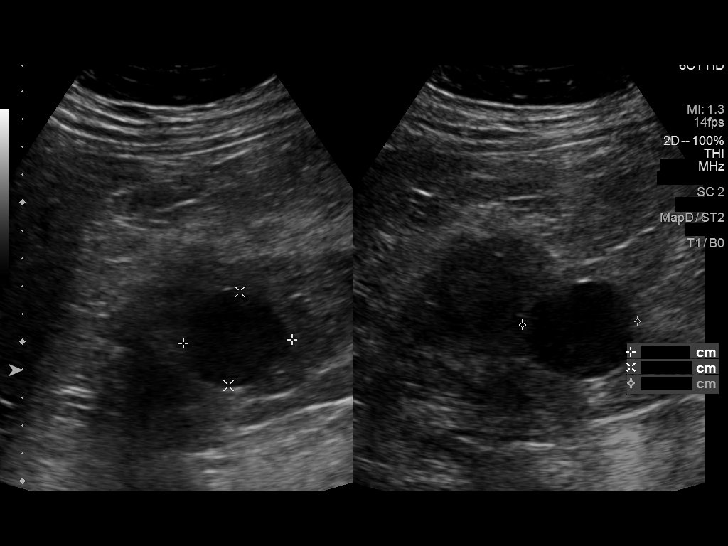
[im 14/53]
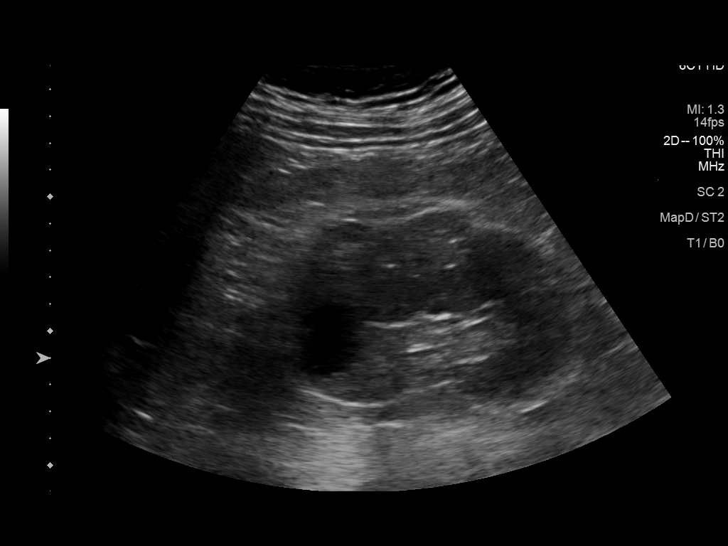
[im 18/53]
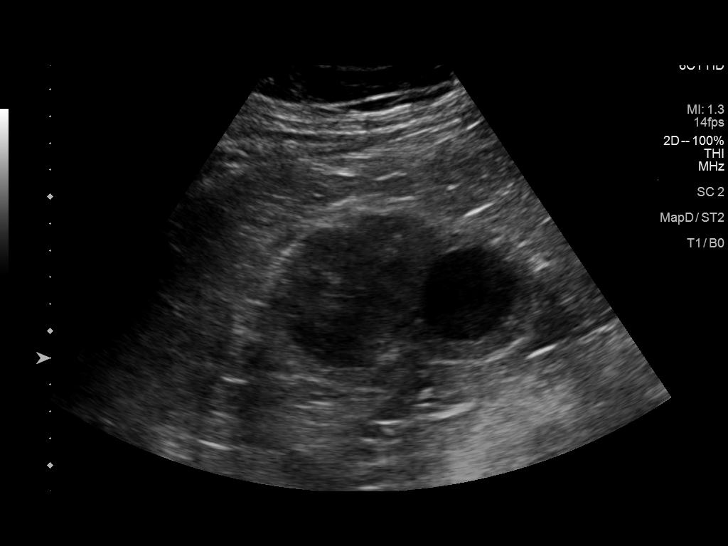
[im 20/53]
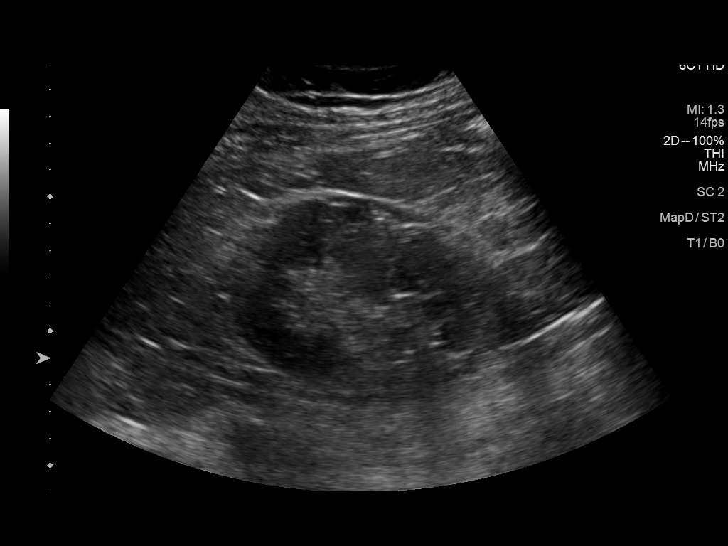
[im 24/53]
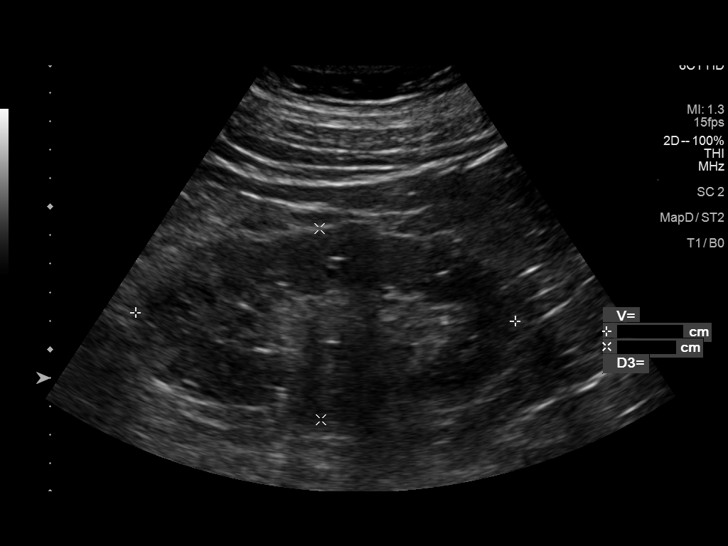
[im 29/53]
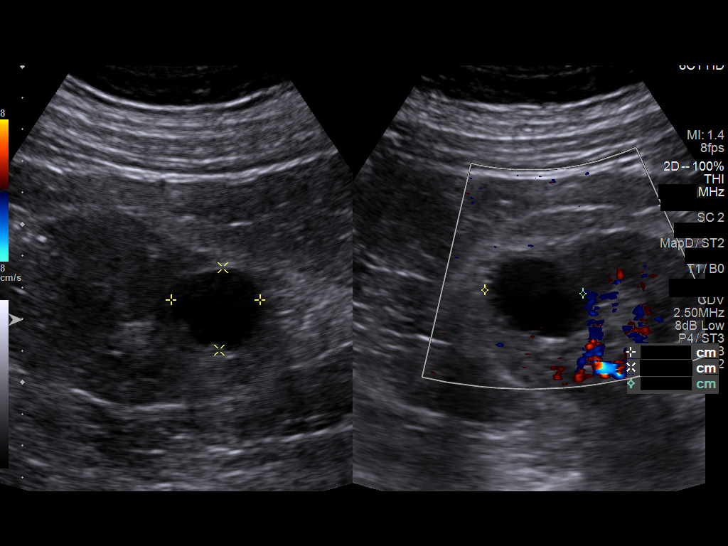
[im 33/53]
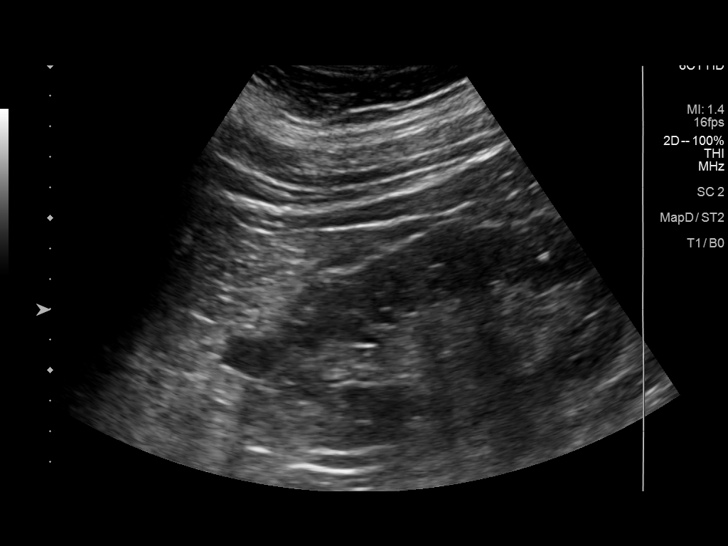
[im 35/53]
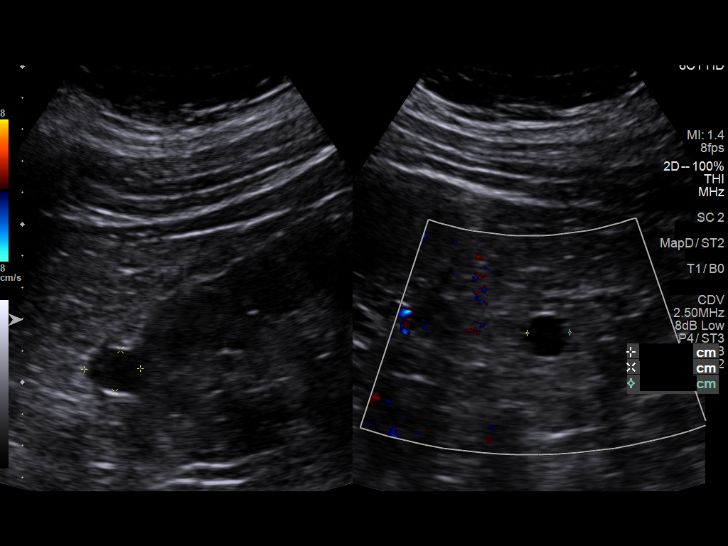
[im 40/53]
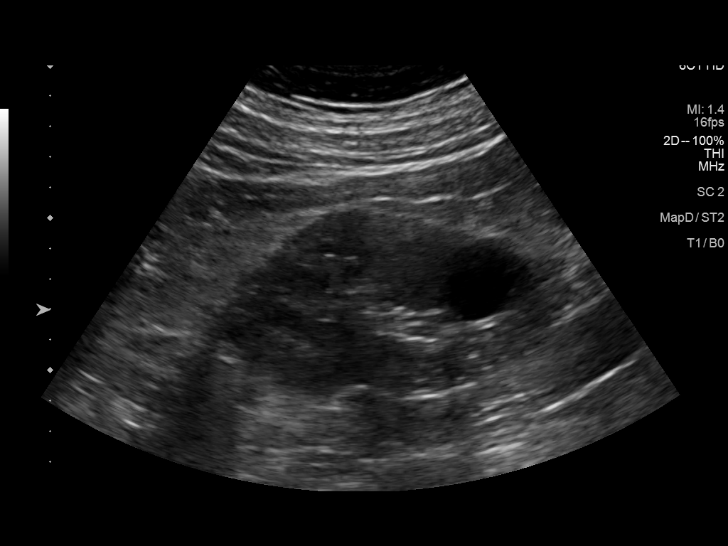
[im 44/53]
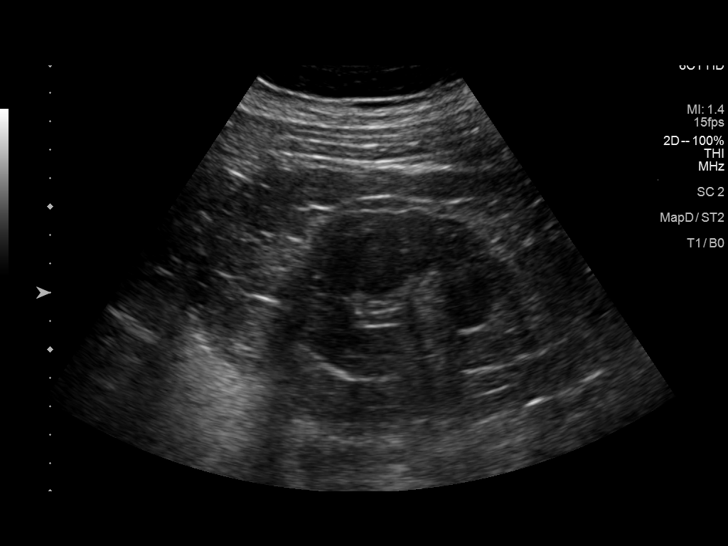
[im 48/53]
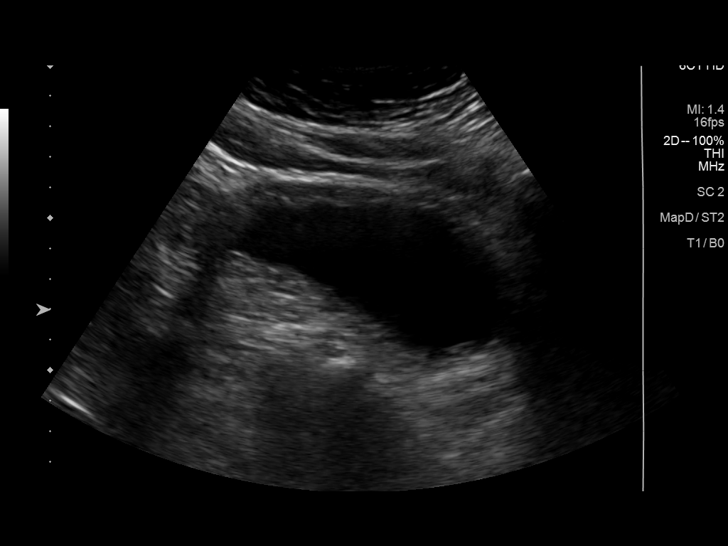
[im 53/53]
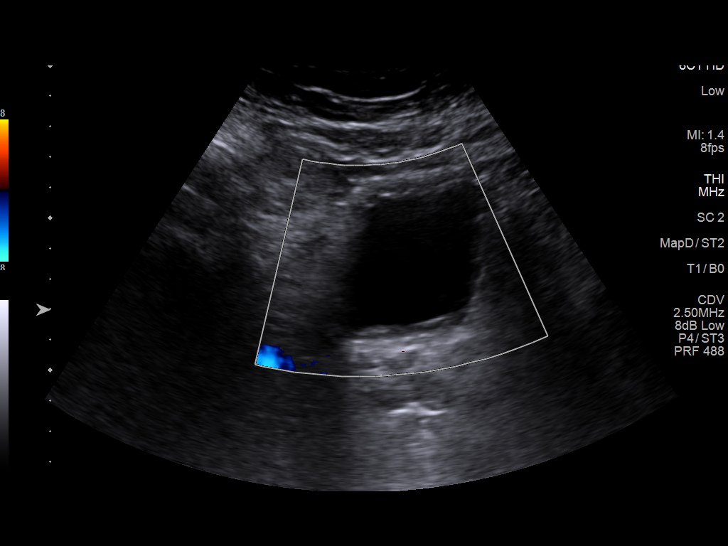

[14 of 25 positions shown; findings below may reference images not displayed]

FINDINGS: Right Kidney:

Renal measurements: 13.8 x 7.5 x 6.5 cm = volume: 352 mL. Cortex is
slightly echogenic. No hydronephrosis. Cyst in the upper pole
measuring 3.4 x 3.4 x 4.1 cm.

Left Kidney:

Renal measurements: 13.3 x 6.7 x 6 cm = volume: 280 mL. Cortex is
slightly echogenic. No hydronephrosis. Cysts, fewer than 10 within
the left kidney. Upper pole cyst measures 1.8 x 1.3 x 1.4 cm. Lower
pole cyst measures 2.8 x 2.6 x 3.1 cm.

Bladder:

Appears normal for degree of bladder distention.
IMPRESSION: 1. Echogenic slightly enlarged kidneys consistent with medical renal
disease. No hydronephrosis
2. Cysts, fewer than 10 within each kidney.

## 2020-06-28 ENCOUNTER — Other Ambulatory Visit: Payer: Self-pay | Admitting: Family Medicine

## 2020-06-28 ENCOUNTER — Ambulatory Visit
Admission: RE | Admit: 2020-06-28 | Discharge: 2020-06-28 | Disposition: A | Discharge status: Home / Self Care | Payer: 59 | Source: Ambulatory Visit | Attending: Family Medicine | Admitting: Family Medicine

## 2020-06-28 DIAGNOSIS — R52 Pain, unspecified: Secondary | ICD-10-CM

## 2022-06-11 DIAGNOSIS — E1165 Type 2 diabetes mellitus with hyperglycemia: Secondary | ICD-10-CM | POA: Diagnosis not present

## 2022-06-11 DIAGNOSIS — F319 Bipolar disorder, unspecified: Secondary | ICD-10-CM | POA: Diagnosis not present

## 2022-08-05 DIAGNOSIS — F3110 Bipolar disorder, current episode manic without psychotic features, unspecified: Secondary | ICD-10-CM | POA: Diagnosis not present

## 2022-08-21 DIAGNOSIS — R42 Dizziness and giddiness: Secondary | ICD-10-CM | POA: Diagnosis not present

## 2022-08-21 DIAGNOSIS — D649 Anemia, unspecified: Secondary | ICD-10-CM | POA: Diagnosis not present

## 2022-08-21 DIAGNOSIS — H9313 Tinnitus, bilateral: Secondary | ICD-10-CM | POA: Diagnosis not present

## 2022-08-28 DIAGNOSIS — I129 Hypertensive chronic kidney disease with stage 1 through stage 4 chronic kidney disease, or unspecified chronic kidney disease: Secondary | ICD-10-CM | POA: Diagnosis not present

## 2022-08-28 DIAGNOSIS — R801 Persistent proteinuria, unspecified: Secondary | ICD-10-CM | POA: Diagnosis not present

## 2022-08-28 DIAGNOSIS — N183 Chronic kidney disease, stage 3 unspecified: Secondary | ICD-10-CM | POA: Diagnosis not present

## 2022-08-28 DIAGNOSIS — E1122 Type 2 diabetes mellitus with diabetic chronic kidney disease: Secondary | ICD-10-CM | POA: Diagnosis not present

## 2022-09-02 ENCOUNTER — Other Ambulatory Visit: Payer: Self-pay | Admitting: Family Medicine

## 2022-09-02 DIAGNOSIS — H532 Diplopia: Secondary | ICD-10-CM

## 2022-09-02 DIAGNOSIS — R42 Dizziness and giddiness: Secondary | ICD-10-CM

## 2022-09-02 DIAGNOSIS — H9313 Tinnitus, bilateral: Secondary | ICD-10-CM

## 2022-09-23 ENCOUNTER — Encounter: Payer: Self-pay | Admitting: Family Medicine

## 2022-09-29 ENCOUNTER — Ambulatory Visit
Admission: RE | Admit: 2022-09-29 | Discharge: 2022-09-29 | Disposition: A | Discharge status: Home / Self Care | Payer: BC Managed Care – PPO | Source: Ambulatory Visit | Attending: Family Medicine | Admitting: Family Medicine

## 2022-09-29 DIAGNOSIS — H532 Diplopia: Secondary | ICD-10-CM | POA: Diagnosis not present

## 2022-09-29 DIAGNOSIS — R42 Dizziness and giddiness: Secondary | ICD-10-CM

## 2022-09-29 DIAGNOSIS — H9319 Tinnitus, unspecified ear: Secondary | ICD-10-CM | POA: Diagnosis not present

## 2022-09-29 DIAGNOSIS — H9313 Tinnitus, bilateral: Secondary | ICD-10-CM

## 2022-09-29 MED ORDER — GADOPICLENOL 0.5 MMOL/ML IV SOLN
10.0000 mL | Freq: Once | INTRAVENOUS | Status: AC | PRN
  Administered 2022-09-29: 10 mL via INTRAVENOUS

## 2022-10-29 DIAGNOSIS — F3110 Bipolar disorder, current episode manic without psychotic features, unspecified: Secondary | ICD-10-CM | POA: Diagnosis not present

## 2022-11-05 ENCOUNTER — Emergency Department (HOSPITAL_COMMUNITY)
Admission: EM | Admit: 2022-11-05 | Discharge: 2022-11-06 | Disposition: A | Discharge status: Home / Self Care | Payer: BC Managed Care – PPO | Attending: Emergency Medicine | Admitting: Emergency Medicine

## 2022-11-05 ENCOUNTER — Other Ambulatory Visit: Payer: Self-pay

## 2022-11-05 ENCOUNTER — Emergency Department (HOSPITAL_COMMUNITY): Payer: BC Managed Care – PPO

## 2022-11-05 DIAGNOSIS — N183 Chronic kidney disease, stage 3 unspecified: Secondary | ICD-10-CM | POA: Diagnosis not present

## 2022-11-05 DIAGNOSIS — Z794 Long term (current) use of insulin: Secondary | ICD-10-CM | POA: Diagnosis not present

## 2022-11-05 DIAGNOSIS — Z79899 Other long term (current) drug therapy: Secondary | ICD-10-CM | POA: Insufficient documentation

## 2022-11-05 DIAGNOSIS — Z7984 Long term (current) use of oral hypoglycemic drugs: Secondary | ICD-10-CM | POA: Diagnosis not present

## 2022-11-05 DIAGNOSIS — I129 Hypertensive chronic kidney disease with stage 1 through stage 4 chronic kidney disease, or unspecified chronic kidney disease: Secondary | ICD-10-CM | POA: Diagnosis not present

## 2022-11-05 DIAGNOSIS — E1122 Type 2 diabetes mellitus with diabetic chronic kidney disease: Secondary | ICD-10-CM | POA: Insufficient documentation

## 2022-11-05 DIAGNOSIS — R0789 Other chest pain: Secondary | ICD-10-CM | POA: Insufficient documentation

## 2022-11-05 DIAGNOSIS — Z7901 Long term (current) use of anticoagulants: Secondary | ICD-10-CM | POA: Diagnosis not present

## 2022-11-05 LAB — BASIC METABOLIC PANEL
Anion gap: 13 (ref 5–15)
BUN: 29 mg/dL — ABNORMAL HIGH (ref 6–20)
CO2: 23 mmol/L (ref 22–32)
Calcium: 8.9 mg/dL (ref 8.9–10.3)
Chloride: 100 mmol/L (ref 98–111)
Creatinine, Ser: 1.97 mg/dL — ABNORMAL HIGH (ref 0.61–1.24)
GFR, Estimated: 39 mL/min — ABNORMAL LOW (ref >60)
Glucose, Bld: 223 mg/dL — ABNORMAL HIGH (ref 70–99)
Potassium: 3.5 mmol/L (ref 3.5–5.1)
Sodium: 136 mmol/L (ref 135–145)

## 2022-11-05 LAB — CBC
HCT: 37.2 % — ABNORMAL LOW (ref 39.0–52.0)
Hemoglobin: 12.6 g/dL — ABNORMAL LOW (ref 13.0–17.0)
MCH: 30 pg (ref 26.0–34.0)
MCHC: 33.9 g/dL (ref 30.0–36.0)
MCV: 88.6 fL (ref 80.0–100.0)
Platelets: 229 10*3/uL (ref 150–400)
RBC: 4.2 MIL/uL — ABNORMAL LOW (ref 4.22–5.81)
RDW: 11.9 % (ref 11.5–15.5)
WBC: 6.1 10*3/uL (ref 4.0–10.5)
nRBC: 0 % (ref 0.0–0.2)

## 2022-11-05 LAB — TROPONIN I (HIGH SENSITIVITY): Troponin I (High Sensitivity): 14 ng/L (ref <18)

## 2022-11-05 NOTE — ED Triage Notes (Addendum)
Pt arrives c/o L sided chest "tightness" for the past few weeks, states that it got worse this afternoon. States its more of an "ache" now. Denies known cardiac hx. Denies aggravating or alleviating factors. Denies dizziness, SOB, n/v, diaphoresis.   Hx of DVT, HTN. Takes 81 mg ASA daily - no thinners.

## 2022-11-05 NOTE — ED Provider Notes (Signed)
[place] EMERGENCY DEPARTMENT AT [facility] Provider Note   CSN: [id number] Arrival date & time: 11/05/22  2204     History {Add pertinent medical, surgical, social history, OB history to HPI:1} Chief Complaint  Patient presents with   Chest Pain    Cameron [name] is a 57 y.o. male.  HPI     This is a 57 year old male with a history of diabetes, hypertension, hyperlipidemia, DVT not on blood thinners who presents with chest discomfort.  Patient reports several week history of chest pressure.  He feels it mostly when he is driving home.  It is in the left side of the chest and is nonradiating.  It has become more constant.  It does not seem to be exertional in nature.  It is not worse with deep breathing.  Reports that he has had a history of 1 unprovoked DVT and PE in the past.  He is no longer on anticoagulation.  Home Medications Prior to Admission medications   Medication Sig Start Date End Date Taking? Authorizing Provider  ARIPiprazole (ABILIFY) 30 MG tablet Take 30 mg by mouth daily.    [provider]  atorvastatin (LIPITOR) 40 MG tablet Take 1 tablet (40 mg total) by mouth daily at 6 PM. 02/01/16   Clapacs, Jackquline Denmark, MD  canagliflozin (INVOKANA) 100 MG TABS tablet Take 300 mg by mouth daily.    [provider]  carbamazepine (TEGRETOL) 200 MG tablet Take 6 tablets (1,200 mg total) by mouth 2 (two) times daily after a meal. 02/01/16   Clapacs, Jackquline Denmark, MD  enalapril (VASOTEC) 5 MG tablet Take 1 tablet (5 mg total) by mouth 2 (two) times daily. 02/01/16   Clapacs, Jackquline Denmark, MD  fenofibrate 160 MG tablet Take 1 tablet (160 mg total) by mouth daily. 02/01/16   Clapacs, Jackquline Denmark, MD  insulin glargine (LANTUS) 100 UNIT/ML injection Inject 0.3 mLs (30 Units total) into the skin at bedtime. 02/01/16   Clapacs, Jackquline Denmark, MD  lamoTRIgine (LAMICTAL) 200 MG tablet Take 1 tablet (200 mg total) by mouth daily. 02/01/16   Clapacs, Jackquline Denmark, MD  Multiple Vitamin  (MULTIVITAMIN WITH MINERALS) TABS tablet Take 1 tablet by mouth daily. 02/01/16   Clapacs, Jackquline Denmark, MD  QUEtiapine (SEROQUEL) 200 MG tablet Take 7 tablets (1,400 mg total) by mouth at bedtime. 02/01/16   Clapacs, Jackquline Denmark, MD  temazepam (RESTORIL) 15 MG capsule Take 1 capsule (15 mg total) by mouth at bedtime. 02/01/16   Clapacs, Jackquline Denmark, MD  warfarin (COUMADIN) 7.5 MG tablet Take 1 tablet (7.5 mg total) by mouth daily at 6 PM. 02/01/16   Clapacs, Jackquline Denmark, MD      Allergies    Asa [aspirin]    Review of Systems   Review of Systems  Constitutional:  Negative for fever.  Respiratory:  Negative for shortness of breath.   Cardiovascular:  Positive for chest pain. Negative for leg swelling.  Gastrointestinal:  Negative for abdominal pain.  All other systems reviewed and are negative.   Physical Exam Updated Vital Signs BP (!) 153/105   Pulse 100   Temp 99.4 F (37.4 C) (Oral)   Resp 18   Ht 1.829 m (6')   Wt (!) 156.5 kg   SpO2 94%   BMI 46.79 kg/m  Physical Exam Vitals and nursing note reviewed.  Constitutional:      Appearance: He is well-developed. He is obese.  HENT:     Head: Normocephalic and  atraumatic.  Eyes:     Pupils: Pupils are equal, round, and reactive to light.  Cardiovascular:     Rate and Rhythm: Normal rate and regular rhythm.     Heart sounds: Normal heart sounds. No murmur heard. Pulmonary:     Effort: Pulmonary effort is normal. No respiratory distress.     Breath sounds: Normal breath sounds. No wheezing.  Abdominal:     General: Bowel sounds are normal.     Palpations: Abdomen is soft.     Tenderness: There is no abdominal tenderness. There is no rebound.  Musculoskeletal:     Cervical back: Neck supple.  Lymphadenopathy:     Cervical: No cervical adenopathy.  Skin:    General: Skin is warm and dry.  Neurological:     Mental Status: He is alert and oriented to person, place, and time.  Psychiatric:        Mood and Affect: Mood normal.     ED  Results / Procedures / Treatments   Labs (all labs ordered are listed, but only abnormal results are displayed) Labs Reviewed  CBC - Abnormal; Notable for the following components:      Result Value   RBC 4.20 (*)    Hemoglobin 12.6 (*)    HCT 37.2 (*)    All other components within normal limits  BASIC METABOLIC PANEL  D-DIMER, QUANTITATIVE  TROPONIN I (HIGH SENSITIVITY)    EKG None  Radiology DG Chest 2 View  Result Date: 11/05/2022 CLINICAL DATA:  Left-sided chest tightness and chest pain. EXAM: CHEST - 2 VIEW COMPARISON:  12/04/2015. FINDINGS: The heart size and mediastinal contours are within normal limits. No consolidation, effusion, or pneumothorax. Degenerative changes are present in the thoracic spine. IMPRESSION: No active cardiopulmonary disease. Electronically Signed   By: Thornell Sartorius M.D.   On: 11/05/2022 22:39    Procedures Procedures  {Document cardiac monitor, telemetry assessment procedure when appropriate:1}  Medications Ordered in ED Medications - No data to display  ED Course/ Medical Decision Making/ A&P   {   Click here for ABCD2, HEART and other calculatorsREFRESH Note before signing :1}                          Medical Decision Making Amount and/or Complexity of Data Reviewed Labs: ordered. Radiology: ordered.   ***  {Document critical care time when appropriate:1} {Document review of labs and clinical decision tools ie heart score, Chads2Vasc2 etc:1}  {Document your independent review of radiology images, and any outside records:1} {Document your discussion with family members, caretakers, and with consultants:1} {Document social determinants of health affecting pt's care:1} {Document your decision making why or why not admission, treatments were needed:1} Final Clinical Impression(s) / ED Diagnoses Final diagnoses:  None    Rx / DC Orders ED Discharge Orders     None

## 2022-11-06 LAB — D-DIMER, QUANTITATIVE: D-Dimer, Quant: 0.46 ug/mL-FEU (ref 0.00–0.50)

## 2022-11-06 LAB — TROPONIN I (HIGH SENSITIVITY): Troponin I (High Sensitivity): 15 ng/L (ref <18)

## 2022-11-06 NOTE — Discharge Instructions (Signed)
You were seen today for chest pain.  Your workup today is reassuring.  Your heart testing is negative.  You do need to follow-up with cardiology as an outpatient for stress testing given your risk factors.  A referral was placed.

## 2022-11-15 NOTE — Progress Notes (Deleted)
CARDIOLOGY CONSULT NOTE       Patient ID: Cameron [name] MRN: [id number] DOB/AGE: August 25, 1965 57 y.o.  Admit date: (Not on file) Referring Physician: Estanislado Pandy [facility] Primary Physician: Darrow Bussing, MD Primary Cardiologist: New Reason for Consultation: Chest pain  Active Problems:   * No active hospital problems. *   HPI:  57 y.o. referred by Dr Dan Humphreys [facility] for chest pain. Seen there 11/05/22 History of DM, HTN, HLD and DVT/PE not on blood thinners currently . Several weeks of SSCP Occurs while driving left sided not necessarily exertional Does not have a pleuritic component   Lab work in ER with Cr 1.97 Hct 37.2 Normal D dimer, negative troponin x 2 CXR NAD and ECG no acute ST/T wave changes   ROS All other systems reviewed and negative except as noted above  Past Medical History:  Diagnosis Date   Bipolar 1 disorder (HCC)    CKD (chronic kidney disease), stage III    Diabetes mellitus without complication (HCC)    DVT (deep venous thrombosis) (HCC)    Hypercholesteremia    Hypertension     Family History  Problem Relation Age of Onset   Stroke Other    Diabetes Other    Stroke Mother     Social History   Socioeconomic History   Marital status: Married    Spouse name: Not on file   Number of children: Not on file   Years of education: Not on file   Highest education level: Not on file  Occupational History   Not on file  Tobacco Use   Smoking status: Never   Smokeless tobacco: Never  Substance and Sexual Activity   Alcohol use: Yes    Comment: rarely   Drug use: No   Sexual activity: Not on file  Other Topics Concern   Not on file  Social History Narrative   Not on file   Social Determinants of Health   Financial Resource Strain: Not on file  Food Insecurity: Not on file  Transportation Needs: Not on file  Physical Activity: Not on file  Stress: Not on file  Social Connections: Not on file  Intimate Partner Violence: Not on file     Past Surgical History:  Procedure Laterality Date   APPENDECTOMY        Current Outpatient Medications:    ARIPiprazole (ABILIFY) 30 MG tablet, Take 30 mg by mouth daily., Disp: , Rfl:    atorvastatin (LIPITOR) 40 MG tablet, Take 1 tablet (40 mg total) by mouth daily at 6 PM., Disp: 30 tablet, Rfl: 1   canagliflozin (INVOKANA) 100 MG TABS tablet, Take 300 mg by mouth daily., Disp: , Rfl:    carbamazepine (TEGRETOL) 200 MG tablet, Take 6 tablets (1,200 mg total) by mouth 2 (two) times daily after a meal., Disp: 360 tablet, Rfl: 1   enalapril (VASOTEC) 5 MG tablet, Take 1 tablet (5 mg total) by mouth 2 (two) times daily., Disp: 30 tablet, Rfl: 1   fenofibrate 160 MG tablet, Take 1 tablet (160 mg total) by mouth daily., Disp: 30 tablet, Rfl: 1   insulin glargine (LANTUS) 100 UNIT/ML injection, Inject 0.3 mLs (30 Units total) into the skin at bedtime., Disp: 10 mL, Rfl: 11   lamoTRIgine (LAMICTAL) 200 MG tablet, Take 1 tablet (200 mg total) by mouth daily., Disp: 30 tablet, Rfl: 1   Multiple Vitamin (MULTIVITAMIN WITH MINERALS) TABS tablet, Take 1 tablet by mouth daily., Disp: 30 tablet, Rfl: 1  QUEtiapine (SEROQUEL) 200 MG tablet, Take 7 tablets (1,400 mg total) by mouth at bedtime., Disp: 210 tablet, Rfl: 1   temazepam (RESTORIL) 15 MG capsule, Take 1 capsule (15 mg total) by mouth at bedtime., Disp: 30 capsule, Rfl: 1   warfarin (COUMADIN) 7.5 MG tablet, Take 1 tablet (7.5 mg total) by mouth daily at 6 PM., Disp: 30 tablet, Rfl: 1    Physical Exam: There were no vitals taken for this visit.   Affect appropriate Obese HEENT: normal Neck supple with no adenopathy JVP normal no bruits no thyromegaly Lungs clear with no wheezing and good diaphragmatic motion Heart:  S1/S2 no murmur, no rub, gallop or click PMI normal Abdomen: benighn, BS positve, no tenderness, no AAA no bruit.  No HSM or HJR Distal pulses intact with no bruits No edema Neuro non-focal Skin warm and dry No  muscular weakness   Labs:   Lab Results  Component Value Date   WBC 6.1 11/05/2022   HGB 12.6 (L) 11/05/2022   HCT 37.2 (L) 11/05/2022   MCV 88.6 11/05/2022   PLT 229 11/05/2022   No results for input(s): "NA", "K", "CL", "CO2", "BUN", "CREATININE", "CALCIUM", "PROT", "BILITOT", "ALKPHOS", "ALT", "AST", "GLUCOSE" in the last 168 hours.  Invalid input(s): "LABALBU" Lab Results  Component Value Date   CKTOTAL 278 (H) 02/27/2008   CKMB 2.6 02/27/2008   TROPONINI 0.03 (HH) 12/06/2015    Lab Results  Component Value Date   CHOL 143 12/11/2015   CHOL 126 12/05/2015   Lab Results  Component Value Date   HDL 32 (L) 12/11/2015   HDL 37 (L) 12/05/2015   Lab Results  Component Value Date   LDLCALC 54 12/11/2015   LDLCALC 56 12/05/2015   Lab Results  Component Value Date   TRIG 286 (H) 12/11/2015   TRIG 163 (H) 12/05/2015   Lab Results  Component Value Date   CHOLHDL 4.5 12/11/2015   CHOLHDL 3.4 12/05/2015   No results found for: "LDLDIRECT"    Radiology: DG Chest 2 View  Result Date: 11/05/2022 CLINICAL DATA:  Left-sided chest tightness and chest pain. EXAM: CHEST - 2 VIEW COMPARISON:  12/04/2015. FINDINGS: The heart size and mediastinal contours are within normal limits. No consolidation, effusion, or pneumothorax. Degenerative changes are present in the thoracic spine. IMPRESSION: No active cardiopulmonary disease. Electronically Signed   By: Thornell Sartorius M.D.   On: 11/05/2022 22:39    EKG: ST rate 100 ICRBBB inferior lateral Q waves    ASSESSMENT AND PLAN:   Chest Pain: high risk risk factors metabolic syndrome, DM, HTN, HLD with abnormal ECG shared decision making favor *** HTN: continue vasotec  CRF:  Cr close to 2 fu nephrology I wonder if he would be a candidate for [place] DVT/PE  unprovoke not on anticoagulation currently in ER d dimer was negative Psych:  bipolar has had hospitalization for manic episodes Prior ECT and now on abilify, tegretol, lamictal  and seroquel  ***  F/U PRN pending tests   Signed: Charlton Haws 11/15/2022, 4:59 PM

## 2022-11-28 ENCOUNTER — Ambulatory Visit: Payer: BC Managed Care – PPO | Admitting: Cardiovascular Disease

## 2022-12-03 ENCOUNTER — Ambulatory Visit: Payer: BC Managed Care – PPO | Attending: Cardiovascular Disease | Admitting: Cardiology

## 2022-12-03 ENCOUNTER — Encounter: Payer: Self-pay | Admitting: Cardiology

## 2022-12-03 ENCOUNTER — Encounter: Payer: Self-pay | Admitting: *Deleted

## 2022-12-03 VITALS — BP 114/72 | HR 92 | Ht 71.0 in | Wt 327.6 lb

## 2022-12-03 DIAGNOSIS — E119 Type 2 diabetes mellitus without complications: Secondary | ICD-10-CM | POA: Diagnosis not present

## 2022-12-03 DIAGNOSIS — Z7985 Long-term (current) use of injectable non-insulin antidiabetic drugs: Secondary | ICD-10-CM

## 2022-12-03 DIAGNOSIS — R072 Precordial pain: Secondary | ICD-10-CM

## 2022-12-03 DIAGNOSIS — E7800 Pure hypercholesterolemia, unspecified: Secondary | ICD-10-CM

## 2022-12-03 DIAGNOSIS — N1831 Chronic kidney disease, stage 3a: Secondary | ICD-10-CM | POA: Diagnosis not present

## 2022-12-03 NOTE — Progress Notes (Signed)
Cardiology Office Note:    Date:  12/03/2022   ID:  COLIE CASTOR, DOB 1965/07/09, MRN [id number]  PCP:  Darrow Bussing, MD   [facility] Providers Cardiologist:  None     Referring MD: Shon Baton, MD    History of Present Illness:    Cameron [name] is a 57 y.o. male here for the evaluation of atypical chest pain after being in the emergency department on 11/05/2022 at the request of Dr. Wilkie Aye.  FHX of CAD, mother, brother - MI  OSA CPAP  Tightness in chest left side, ?MSK or heart while driving. Went to ER, felt like bruise.  Felt fairly constant.  Feeling better now.  Has diabetes-hemoglobin A1c 6.4  Has stage IIIa chronic kidney disease with creatinine of 1.97  Denies any syncope bleeding orthopnea PND.  Past Medical History:  Diagnosis Date   Bipolar 1 disorder (HCC)    CKD (chronic kidney disease), stage III (HCC)    Diabetes mellitus without complication (HCC)    DVT (deep venous thrombosis) (HCC)    Hypercholesteremia    Hypertension     Past Surgical History:  Procedure Laterality Date   APPENDECTOMY      Current Medications: Current Meds  Medication Sig   AMLODIPINE BESYLATE PO Take 5 mg by mouth daily.   ARIPiprazole (ABILIFY) 30 MG tablet Take 30 mg by mouth daily.   aspirin EC (ASPIRIN ADULT LOW DOSE) 81 MG tablet Take 81 mg by mouth once.   atorvastatin (LIPITOR) 40 MG tablet Take 1 tablet (40 mg total) by mouth daily at 6 PM.   carbamazepine (TEGRETOL) 200 MG tablet Take 6 tablets (1,200 mg total) by mouth 2 (two) times daily after a meal.   enalapril (VASOTEC) 5 MG tablet Take 1 tablet (5 mg total) by mouth 2 (two) times daily.   fenofibrate 160 MG tablet Take 1 tablet (160 mg total) by mouth daily.   lamoTRIgine (LAMICTAL) 200 MG tablet Take 1 tablet (200 mg total) by mouth daily. (Patient taking differently: Take 100 mg by mouth daily.)   loratadine (CLARITIN) 10 MG tablet Take 10 mg by mouth daily.   melatonin 3 MG TABS  tablet Take 3 mg by mouth as needed.   Multiple Vitamin (MULTIVITAMIN WITH MINERALS) TABS tablet Take 1 tablet by mouth daily.   NOVOLOG MIX 70/30 FLEXPEN (70-30) 100 UNIT/ML FlexPen Inject 70 Units into the skin 3 (three) times daily.   QUEtiapine (SEROQUEL) 200 MG tablet Take 7 tablets (1,400 mg total) by mouth at bedtime.   Semaglutide,0.25 or 0.5MG /DOS, (OZEMPIC, 0.25 OR 0.5 MG/DOSE,) 2 MG/3ML SOPN Inject 0.5 mg into the skin once a week.   temazepam (RESTORIL) 15 MG capsule Take 1 capsule (15 mg total) by mouth at bedtime.   warfarin (COUMADIN) 7.5 MG tablet Take 1 tablet (7.5 mg total) by mouth daily at 6 PM.   [DISCONTINUED] insulin glargine (LANTUS) 100 UNIT/ML injection Inject 0.3 mLs (30 Units total) into the skin at bedtime.     Allergies:   Asa [aspirin]   Social History   Socioeconomic History   Marital status: Married    Spouse name: Not on file   Number of children: Not on file   Years of education: Not on file   Highest education level: Not on file  Occupational History   Not on file  Tobacco Use   Smoking status: Never   Smokeless tobacco: Never  Substance and Sexual Activity   Alcohol use:  Yes    Comment: rarely   Drug use: No   Sexual activity: Not on file  Other Topics Concern   Not on file  Social History Narrative   Not on file   Social Determinants of Health   Financial Resource Strain: Not on file  Food Insecurity: Not on file  Transportation Needs: Not on file  Physical Activity: Not on file  Stress: Not on file  Social Connections: Not on file     Family History: The patient's family history includes Diabetes in an other family member; Stroke in his mother and another family member.  ROS:   Please see the history of present illness.     All other systems reviewed and are negative.  EKGs/Labs/Other Studies Reviewed:    The following studies were reviewed today: ER notes lab work EKG reviewed Echo 2017-normal EF  EKG:  The ekg ordered  today demonstrates sinus rhythm/sinus tachycardia 100 with right bundle branch block.  No ischemic changes  Recent Labs: 11/05/2022: BUN 29; Creatinine, Ser 1.97; Hemoglobin 12.6; Platelets 229; Potassium 3.5; Sodium 136  Recent Lipid Panel    Component Value Date/Time   CHOL 143 12/11/2015 1712   TRIG 286 (H) 12/11/2015 1712   HDL 32 (L) 12/11/2015 1712   CHOLHDL 4.5 12/11/2015 1712   VLDL 57 (H) 12/11/2015 1712   LDLCALC 54 12/11/2015 1712     Risk Assessment/Calculations:                Physical Exam:    VS:  BP 114/72   Pulse 92   Ht 5\' 11"  (1.803 m)   Wt (!) 327 lb 9.6 oz (148.6 kg)   SpO2 94%   BMI 45.69 kg/m     Wt Readings from Last 3 Encounters:  12/03/22 (!) 327 lb 9.6 oz (148.6 kg)  11/05/22 (!) 345 lb (156.5 kg)  03/28/17 (!) 328 lb (148.8 kg)     GEN:  Well nourished, well developed in no acute distress HEENT: Normal NECK: No JVD; No carotid bruits LYMPHATICS: No lymphadenopathy CARDIAC: RRR, no murmurs, rubs, gallops RESPIRATORY:  Clear to auscultation without rales, wheezing or rhonchi  ABDOMEN: Soft, non-tender, non-distended MUSCULOSKELETAL:  No edema; No deformity  SKIN: Warm and dry, hyperpigmentation lower extremity.  Normal distal pulses. NEUROLOGIC:  Alert and oriented x 3 PSYCHIATRIC:  Normal affect   ASSESSMENT:    1. Precordial pain   2. Diabetes mellitus without complication (HCC)   3. Stage 3a chronic kidney disease (HCC)   4. Hypercholesteremia    PLAN:    In order of problems listed above:  Chest pain - Checking pharmacologic stress test. -Fairly atypical features.  Hyperpigmentation of lower extremities - Normal palpation of distal pulses  Diabetes with hypertension and chronic kidney disease stage IIIa - Because of creatinine approximately 2, we will avoid CT scan of coronaries.  Performing nuclear stress test for further evaluation.  Hyperlipidemia - On atorvastatin 40 as well as fibrate 160, LDL 78 triglycerides  228.  Excellent.  Morbid obesity - Continue to encourage weight loss.  He is on Ozempic.  Prior history of DVT PE - Was on short-term Coumadin.  No further recurrence.       Informed Consent   Shared Decision Making/Informed Consent The risks [chest pain, shortness of breath, cardiac arrhythmias, dizziness, blood pressure fluctuations, myocardial infarction, stroke/transient ischemic attack, nausea, vomiting, allergic reaction, radiation exposure, metallic taste sensation and life-threatening complications (estimated to be 1 in 10,000)], benefits (risk stratification,  diagnosing coronary artery disease, treatment guidance) and alternatives of a nuclear stress test were discussed in detail with Mr. Durst and he agrees to proceed.       Medication Adjustments/Labs and Tests Ordered: Current medicines are reviewed at length with the patient today.  Concerns regarding medicines are outlined above.  Orders Placed This Encounter  Procedures   Cardiac Stress Test: Informed Consent Details: Physician/Practitioner Attestation; Transcribe to consent form and obtain patient signature   MYOCARDIAL PERFUSION IMAGING   No orders of the defined types were placed in this encounter.   Patient Instructions  Medication Instructions:  The current medical regimen is effective;  continue present plan and medications.  *If you need a refill on your cardiac medications before your next appointment, please call your pharmacy*  Testing/Procedures: Your physician has requested that you have a lexiscan myoview. For further information please visit [email or web address] Please follow instruction sheet, as given.  Follow-Up: At [facility], you and your health needs are our priority.  As part of our continuing mission to provide you with exceptional heart care, we have created designated Provider Care Teams.  These Care Teams include your primary Cardiologist (physician) and Advanced Practice  Providers (APPs -  Physician Assistants and Nurse Practitioners) who all work together to provide you with the care you need, when you need it.  We recommend signing up for the patient portal called "MyChart".  Sign up information is provided on this After Visit Summary.  MyChart is used to connect with patients for Virtual Visits (Telemedicine).  Patients are able to view lab/test results, encounter notes, upcoming appointments, etc.  Non-urgent messages can be sent to your provider as well.   To learn more about what you can do with MyChart, go to [email or web address].    Your next appointment:   Follow up will be based on the results of the above testing.    Signed, Donato Schultz, MD  12/03/2022 3:36 PM    [facility]

## 2022-12-03 NOTE — Patient Instructions (Signed)
Medication Instructions:  The current medical regimen is effective;  continue present plan and medications.  *If you need a refill on your cardiac medications before your next appointment, please call your pharmacy*  Testing/Procedures: Your physician has requested that you have a lexiscan myoview. For further information please visit [email or web address] Please follow instruction sheet, as given.  Follow-Up: At [facility], you and your health needs are our priority.  As part of our continuing mission to provide you with exceptional heart care, we have created designated Provider Care Teams.  These Care Teams include your primary Cardiologist (physician) and Advanced Practice Providers (APPs -  Physician Assistants and Nurse Practitioners) who all work together to provide you with the care you need, when you need it.  We recommend signing up for the patient portal called "MyChart".  Sign up information is provided on this After Visit Summary.  MyChart is used to connect with patients for Virtual Visits (Telemedicine).  Patients are able to view lab/test results, encounter notes, upcoming appointments, etc.  Non-urgent messages can be sent to your provider as well.   To learn more about what you can do with MyChart, go to [email or web address].    Your next appointment:   Follow up will be based on the results of the above testing.

## 2022-12-04 ENCOUNTER — Telehealth (HOSPITAL_COMMUNITY): Payer: Self-pay | Admitting: *Deleted

## 2022-12-04 NOTE — Telephone Encounter (Signed)
Per DPR left message on home phone with detailed instructions for MPI study.

## 2022-12-10 DIAGNOSIS — F319 Bipolar disorder, unspecified: Secondary | ICD-10-CM | POA: Diagnosis not present

## 2022-12-10 DIAGNOSIS — Z794 Long term (current) use of insulin: Secondary | ICD-10-CM | POA: Diagnosis not present

## 2022-12-10 DIAGNOSIS — E1165 Type 2 diabetes mellitus with hyperglycemia: Secondary | ICD-10-CM | POA: Diagnosis not present

## 2022-12-12 ENCOUNTER — Ambulatory Visit (HOSPITAL_COMMUNITY): Payer: BC Managed Care – PPO | Attending: Cardiology

## 2022-12-12 DIAGNOSIS — R072 Precordial pain: Secondary | ICD-10-CM | POA: Diagnosis not present

## 2022-12-12 MED ORDER — TECHNETIUM TC 99M TETROFOSMIN IV KIT
32.6000 | PACK | Freq: Once | INTRAVENOUS | Status: AC | PRN
  Administered 2022-12-12: 32.6 via INTRAVENOUS

## 2022-12-12 MED ORDER — REGADENOSON 0.4 MG/5ML IV SOLN
0.4000 mg | Freq: Once | INTRAVENOUS | Status: AC
Start: 2022-12-12 — End: 2022-12-12
  Administered 2022-12-12: 0.4 mg via INTRAVENOUS

## 2022-12-13 ENCOUNTER — Ambulatory Visit (HOSPITAL_COMMUNITY): Payer: BC Managed Care – PPO | Attending: Internal Medicine

## 2022-12-13 LAB — MYOCARDIAL PERFUSION IMAGING
LV dias vol: 89 mL (ref 62–150)
LV sys vol: 30 mL
Nuc Stress EF: 66 %
Peak HR: 93 {beats}/min
Rest HR: 77 {beats}/min
Rest Nuclear Isotope Dose: 30.8 mCi
SDS: 5
SRS: 0
SSS: 5
ST Depression (mm): 0 mm
Stress Nuclear Isotope Dose: 32.6 mCi
TID: 0.77

## 2022-12-13 MED ORDER — TECHNETIUM TC 99M TETROFOSMIN IV KIT
30.8000 | PACK | Freq: Once | INTRAVENOUS | Status: AC | PRN
  Administered 2022-12-13: 30.8 via INTRAVENOUS

## 2022-12-20 DIAGNOSIS — G4733 Obstructive sleep apnea (adult) (pediatric): Secondary | ICD-10-CM | POA: Diagnosis not present

## 2023-01-09 DIAGNOSIS — Z125 Encounter for screening for malignant neoplasm of prostate: Secondary | ICD-10-CM | POA: Diagnosis not present

## 2023-01-09 DIAGNOSIS — I10 Essential (primary) hypertension: Secondary | ICD-10-CM | POA: Diagnosis not present

## 2023-01-09 DIAGNOSIS — F319 Bipolar disorder, unspecified: Secondary | ICD-10-CM | POA: Diagnosis not present

## 2023-01-09 DIAGNOSIS — E1165 Type 2 diabetes mellitus with hyperglycemia: Secondary | ICD-10-CM | POA: Diagnosis not present

## 2023-01-09 DIAGNOSIS — Z0000 Encounter for general adult medical examination without abnormal findings: Secondary | ICD-10-CM | POA: Diagnosis not present

## 2023-01-09 DIAGNOSIS — E7800 Pure hypercholesterolemia, unspecified: Secondary | ICD-10-CM | POA: Diagnosis not present

## 2023-01-09 DIAGNOSIS — R209 Unspecified disturbances of skin sensation: Secondary | ICD-10-CM | POA: Diagnosis not present

## 2023-01-16 DIAGNOSIS — E1122 Type 2 diabetes mellitus with diabetic chronic kidney disease: Secondary | ICD-10-CM | POA: Diagnosis not present

## 2023-01-16 DIAGNOSIS — I129 Hypertensive chronic kidney disease with stage 1 through stage 4 chronic kidney disease, or unspecified chronic kidney disease: Secondary | ICD-10-CM | POA: Diagnosis not present

## 2023-01-16 DIAGNOSIS — R801 Persistent proteinuria, unspecified: Secondary | ICD-10-CM | POA: Diagnosis not present

## 2023-01-16 DIAGNOSIS — N183 Chronic kidney disease, stage 3 unspecified: Secondary | ICD-10-CM | POA: Diagnosis not present

## 2023-01-20 DIAGNOSIS — G4733 Obstructive sleep apnea (adult) (pediatric): Secondary | ICD-10-CM | POA: Diagnosis not present

## 2023-01-22 DIAGNOSIS — F3162 Bipolar disorder, current episode mixed, moderate: Secondary | ICD-10-CM | POA: Diagnosis not present

## 2023-01-22 DIAGNOSIS — F413 Other mixed anxiety disorders: Secondary | ICD-10-CM | POA: Diagnosis not present

## 2023-02-20 DIAGNOSIS — G4733 Obstructive sleep apnea (adult) (pediatric): Secondary | ICD-10-CM | POA: Diagnosis not present

## 2023-04-16 DIAGNOSIS — F3162 Bipolar disorder, current episode mixed, moderate: Secondary | ICD-10-CM | POA: Diagnosis not present

## 2023-04-16 DIAGNOSIS — F413 Other mixed anxiety disorders: Secondary | ICD-10-CM | POA: Diagnosis not present

## 2023-04-27 DIAGNOSIS — Z23 Encounter for immunization: Secondary | ICD-10-CM | POA: Diagnosis not present

## 2023-05-23 DIAGNOSIS — N183 Chronic kidney disease, stage 3 unspecified: Secondary | ICD-10-CM | POA: Diagnosis not present

## 2023-05-23 DIAGNOSIS — R801 Persistent proteinuria, unspecified: Secondary | ICD-10-CM | POA: Diagnosis not present

## 2023-05-23 DIAGNOSIS — I129 Hypertensive chronic kidney disease with stage 1 through stage 4 chronic kidney disease, or unspecified chronic kidney disease: Secondary | ICD-10-CM | POA: Diagnosis not present

## 2023-05-23 DIAGNOSIS — E1122 Type 2 diabetes mellitus with diabetic chronic kidney disease: Secondary | ICD-10-CM | POA: Diagnosis not present

## 2023-06-13 DIAGNOSIS — F319 Bipolar disorder, unspecified: Secondary | ICD-10-CM | POA: Diagnosis not present

## 2023-06-13 DIAGNOSIS — Z794 Long term (current) use of insulin: Secondary | ICD-10-CM | POA: Diagnosis not present

## 2023-06-13 DIAGNOSIS — E1165 Type 2 diabetes mellitus with hyperglycemia: Secondary | ICD-10-CM | POA: Diagnosis not present

## 2023-06-18 DIAGNOSIS — E1165 Type 2 diabetes mellitus with hyperglycemia: Secondary | ICD-10-CM | POA: Diagnosis not present

## 2023-07-09 DIAGNOSIS — F3162 Bipolar disorder, current episode mixed, moderate: Secondary | ICD-10-CM | POA: Diagnosis not present

## 2023-07-09 DIAGNOSIS — F413 Other mixed anxiety disorders: Secondary | ICD-10-CM | POA: Diagnosis not present

## 2023-09-09 DIAGNOSIS — E785 Hyperlipidemia, unspecified: Secondary | ICD-10-CM | POA: Diagnosis not present

## 2023-09-09 DIAGNOSIS — E1122 Type 2 diabetes mellitus with diabetic chronic kidney disease: Secondary | ICD-10-CM | POA: Diagnosis not present

## 2023-09-09 DIAGNOSIS — E1169 Type 2 diabetes mellitus with other specified complication: Secondary | ICD-10-CM | POA: Diagnosis not present

## 2023-09-09 DIAGNOSIS — R32 Unspecified urinary incontinence: Secondary | ICD-10-CM | POA: Diagnosis not present

## 2023-09-09 DIAGNOSIS — F413 Other mixed anxiety disorders: Secondary | ICD-10-CM | POA: Diagnosis not present

## 2023-09-09 DIAGNOSIS — N183 Chronic kidney disease, stage 3 unspecified: Secondary | ICD-10-CM | POA: Diagnosis not present

## 2023-09-09 DIAGNOSIS — H409 Unspecified glaucoma: Secondary | ICD-10-CM | POA: Diagnosis not present

## 2023-09-09 DIAGNOSIS — F3162 Bipolar disorder, current episode mixed, moderate: Secondary | ICD-10-CM | POA: Diagnosis not present

## 2023-09-09 DIAGNOSIS — Z781 Physical restraint status: Secondary | ICD-10-CM | POA: Diagnosis not present

## 2023-09-09 DIAGNOSIS — E113299 Type 2 diabetes mellitus with mild nonproliferative diabetic retinopathy without macular edema, unspecified eye: Secondary | ICD-10-CM | POA: Diagnosis not present

## 2023-09-09 DIAGNOSIS — F3112 Bipolar disorder, current episode manic without psychotic features, moderate: Secondary | ICD-10-CM | POA: Diagnosis not present

## 2023-09-09 DIAGNOSIS — G4733 Obstructive sleep apnea (adult) (pediatric): Secondary | ICD-10-CM | POA: Diagnosis not present

## 2023-09-09 DIAGNOSIS — R801 Persistent proteinuria, unspecified: Secondary | ICD-10-CM | POA: Diagnosis not present

## 2023-09-09 DIAGNOSIS — E669 Obesity, unspecified: Secondary | ICD-10-CM | POA: Diagnosis not present

## 2023-09-09 DIAGNOSIS — R7989 Other specified abnormal findings of blood chemistry: Secondary | ICD-10-CM | POA: Diagnosis not present

## 2023-09-09 DIAGNOSIS — Z794 Long term (current) use of insulin: Secondary | ICD-10-CM | POA: Diagnosis not present

## 2023-09-09 DIAGNOSIS — F319 Bipolar disorder, unspecified: Secondary | ICD-10-CM | POA: Diagnosis not present

## 2023-09-09 DIAGNOSIS — F312 Bipolar disorder, current episode manic severe with psychotic features: Secondary | ICD-10-CM | POA: Diagnosis not present

## 2023-09-09 DIAGNOSIS — E1165 Type 2 diabetes mellitus with hyperglycemia: Secondary | ICD-10-CM | POA: Diagnosis not present

## 2023-09-09 DIAGNOSIS — E1142 Type 2 diabetes mellitus with diabetic polyneuropathy: Secondary | ICD-10-CM | POA: Diagnosis not present

## 2023-09-09 DIAGNOSIS — H43811 Vitreous degeneration, right eye: Secondary | ICD-10-CM | POA: Diagnosis not present

## 2023-09-09 DIAGNOSIS — Z634 Disappearance and death of family member: Secondary | ICD-10-CM | POA: Diagnosis not present

## 2023-09-09 DIAGNOSIS — F309 Manic episode, unspecified: Secondary | ICD-10-CM | POA: Diagnosis not present

## 2023-09-09 DIAGNOSIS — I4510 Unspecified right bundle-branch block: Secondary | ICD-10-CM | POA: Diagnosis not present

## 2023-09-09 DIAGNOSIS — F3181 Bipolar II disorder: Secondary | ICD-10-CM | POA: Diagnosis not present

## 2023-09-09 DIAGNOSIS — K5900 Constipation, unspecified: Secondary | ICD-10-CM | POA: Diagnosis not present

## 2023-09-09 DIAGNOSIS — R079 Chest pain, unspecified: Secondary | ICD-10-CM | POA: Diagnosis not present

## 2023-09-09 DIAGNOSIS — Z91148 Patient's other noncompliance with medication regimen for other reason: Secondary | ICD-10-CM | POA: Diagnosis not present

## 2023-09-09 DIAGNOSIS — F3113 Bipolar disorder, current episode manic without psychotic features, severe: Secondary | ICD-10-CM | POA: Diagnosis not present

## 2023-09-09 DIAGNOSIS — I129 Hypertensive chronic kidney disease with stage 1 through stage 4 chronic kidney disease, or unspecified chronic kidney disease: Secondary | ICD-10-CM | POA: Diagnosis not present

## 2023-09-09 DIAGNOSIS — R9431 Abnormal electrocardiogram [ECG] [EKG]: Secondary | ICD-10-CM | POA: Diagnosis not present

## 2023-09-09 DIAGNOSIS — E113593 Type 2 diabetes mellitus with proliferative diabetic retinopathy without macular edema, bilateral: Secondary | ICD-10-CM | POA: Diagnosis not present

## 2023-09-09 DIAGNOSIS — E113599 Type 2 diabetes mellitus with proliferative diabetic retinopathy without macular edema, unspecified eye: Secondary | ICD-10-CM | POA: Diagnosis not present

## 2023-09-09 DIAGNOSIS — N1831 Chronic kidney disease, stage 3a: Secondary | ICD-10-CM | POA: Diagnosis not present

## 2023-09-09 DIAGNOSIS — Z833 Family history of diabetes mellitus: Secondary | ICD-10-CM | POA: Diagnosis not present

## 2023-09-09 DIAGNOSIS — S80211A Abrasion, right knee, initial encounter: Secondary | ICD-10-CM | POA: Diagnosis not present

## 2023-09-09 DIAGNOSIS — F3189 Other bipolar disorder: Secondary | ICD-10-CM | POA: Diagnosis not present

## 2023-09-09 DIAGNOSIS — Z6841 Body Mass Index (BMI) 40.0 and over, adult: Secondary | ICD-10-CM | POA: Diagnosis not present

## 2023-09-09 DIAGNOSIS — Z9181 History of falling: Secondary | ICD-10-CM | POA: Diagnosis not present

## 2023-09-09 DIAGNOSIS — Z86711 Personal history of pulmonary embolism: Secondary | ICD-10-CM | POA: Diagnosis not present

## 2023-09-30 DIAGNOSIS — N183 Chronic kidney disease, stage 3 unspecified: Secondary | ICD-10-CM | POA: Diagnosis not present

## 2023-09-30 DIAGNOSIS — E1122 Type 2 diabetes mellitus with diabetic chronic kidney disease: Secondary | ICD-10-CM | POA: Diagnosis not present

## 2023-09-30 DIAGNOSIS — R801 Persistent proteinuria, unspecified: Secondary | ICD-10-CM | POA: Diagnosis not present

## 2023-09-30 DIAGNOSIS — I129 Hypertensive chronic kidney disease with stage 1 through stage 4 chronic kidney disease, or unspecified chronic kidney disease: Secondary | ICD-10-CM | POA: Diagnosis not present

## 2023-10-01 DIAGNOSIS — F3162 Bipolar disorder, current episode mixed, moderate: Secondary | ICD-10-CM | POA: Diagnosis not present

## 2023-10-01 DIAGNOSIS — F413 Other mixed anxiety disorders: Secondary | ICD-10-CM | POA: Diagnosis not present

## 2024-01-06 DIAGNOSIS — F413 Other mixed anxiety disorders: Secondary | ICD-10-CM | POA: Diagnosis not present

## 2024-01-06 DIAGNOSIS — F3162 Bipolar disorder, current episode mixed, moderate: Secondary | ICD-10-CM | POA: Diagnosis not present

## 2024-01-07 DIAGNOSIS — H43811 Vitreous degeneration, right eye: Secondary | ICD-10-CM | POA: Diagnosis not present

## 2024-01-10 DIAGNOSIS — E1169 Type 2 diabetes mellitus with other specified complication: Secondary | ICD-10-CM | POA: Diagnosis not present

## 2024-01-10 DIAGNOSIS — F3112 Bipolar disorder, current episode manic without psychotic features, moderate: Secondary | ICD-10-CM | POA: Diagnosis not present

## 2024-01-10 DIAGNOSIS — F309 Manic episode, unspecified: Secondary | ICD-10-CM | POA: Diagnosis not present

## 2024-01-10 DIAGNOSIS — N183 Chronic kidney disease, stage 3 unspecified: Secondary | ICD-10-CM | POA: Diagnosis not present

## 2024-01-10 DIAGNOSIS — E1165 Type 2 diabetes mellitus with hyperglycemia: Secondary | ICD-10-CM | POA: Diagnosis not present

## 2024-01-10 DIAGNOSIS — E1122 Type 2 diabetes mellitus with diabetic chronic kidney disease: Secondary | ICD-10-CM | POA: Diagnosis not present

## 2024-01-11 DIAGNOSIS — F3112 Bipolar disorder, current episode manic without psychotic features, moderate: Secondary | ICD-10-CM | POA: Diagnosis not present

## 2024-01-12 DIAGNOSIS — E1165 Type 2 diabetes mellitus with hyperglycemia: Secondary | ICD-10-CM | POA: Diagnosis not present

## 2024-01-12 DIAGNOSIS — E669 Obesity, unspecified: Secondary | ICD-10-CM | POA: Diagnosis not present

## 2024-01-12 DIAGNOSIS — F309 Manic episode, unspecified: Secondary | ICD-10-CM | POA: Diagnosis not present

## 2024-01-12 DIAGNOSIS — I4510 Unspecified right bundle-branch block: Secondary | ICD-10-CM | POA: Diagnosis not present

## 2024-01-12 DIAGNOSIS — E1169 Type 2 diabetes mellitus with other specified complication: Secondary | ICD-10-CM | POA: Diagnosis not present

## 2024-01-13 DIAGNOSIS — E113599 Type 2 diabetes mellitus with proliferative diabetic retinopathy without macular edema, unspecified eye: Secondary | ICD-10-CM | POA: Diagnosis not present

## 2024-01-13 DIAGNOSIS — N183 Chronic kidney disease, stage 3 unspecified: Secondary | ICD-10-CM | POA: Diagnosis not present

## 2024-01-13 DIAGNOSIS — E1165 Type 2 diabetes mellitus with hyperglycemia: Secondary | ICD-10-CM | POA: Diagnosis not present

## 2024-01-13 DIAGNOSIS — F319 Bipolar disorder, unspecified: Secondary | ICD-10-CM | POA: Diagnosis not present

## 2024-01-13 DIAGNOSIS — E1122 Type 2 diabetes mellitus with diabetic chronic kidney disease: Secondary | ICD-10-CM | POA: Diagnosis not present

## 2024-01-14 DIAGNOSIS — F319 Bipolar disorder, unspecified: Secondary | ICD-10-CM | POA: Diagnosis not present

## 2024-01-14 DIAGNOSIS — F3112 Bipolar disorder, current episode manic without psychotic features, moderate: Secondary | ICD-10-CM | POA: Diagnosis not present

## 2024-01-14 DIAGNOSIS — E1122 Type 2 diabetes mellitus with diabetic chronic kidney disease: Secondary | ICD-10-CM | POA: Diagnosis not present

## 2024-01-14 DIAGNOSIS — E1165 Type 2 diabetes mellitus with hyperglycemia: Secondary | ICD-10-CM | POA: Diagnosis not present

## 2024-01-14 DIAGNOSIS — E113299 Type 2 diabetes mellitus with mild nonproliferative diabetic retinopathy without macular edema, unspecified eye: Secondary | ICD-10-CM | POA: Diagnosis not present

## 2024-01-14 DIAGNOSIS — E1169 Type 2 diabetes mellitus with other specified complication: Secondary | ICD-10-CM | POA: Diagnosis not present

## 2024-01-15 DIAGNOSIS — E113299 Type 2 diabetes mellitus with mild nonproliferative diabetic retinopathy without macular edema, unspecified eye: Secondary | ICD-10-CM | POA: Diagnosis not present

## 2024-01-15 DIAGNOSIS — E1169 Type 2 diabetes mellitus with other specified complication: Secondary | ICD-10-CM | POA: Diagnosis not present

## 2024-01-15 DIAGNOSIS — E1122 Type 2 diabetes mellitus with diabetic chronic kidney disease: Secondary | ICD-10-CM | POA: Diagnosis not present

## 2024-01-15 DIAGNOSIS — F319 Bipolar disorder, unspecified: Secondary | ICD-10-CM | POA: Diagnosis not present

## 2024-01-15 DIAGNOSIS — E1165 Type 2 diabetes mellitus with hyperglycemia: Secondary | ICD-10-CM | POA: Diagnosis not present

## 2024-01-16 DIAGNOSIS — F3113 Bipolar disorder, current episode manic without psychotic features, severe: Secondary | ICD-10-CM | POA: Diagnosis not present

## 2024-01-16 DIAGNOSIS — E1165 Type 2 diabetes mellitus with hyperglycemia: Secondary | ICD-10-CM | POA: Diagnosis not present

## 2024-01-16 DIAGNOSIS — F319 Bipolar disorder, unspecified: Secondary | ICD-10-CM | POA: Diagnosis not present

## 2024-01-16 DIAGNOSIS — E1169 Type 2 diabetes mellitus with other specified complication: Secondary | ICD-10-CM | POA: Diagnosis not present

## 2024-01-16 DIAGNOSIS — E669 Obesity, unspecified: Secondary | ICD-10-CM | POA: Diagnosis not present

## 2024-01-17 DIAGNOSIS — F309 Manic episode, unspecified: Secondary | ICD-10-CM | POA: Diagnosis not present

## 2024-01-18 DIAGNOSIS — F309 Manic episode, unspecified: Secondary | ICD-10-CM | POA: Diagnosis not present

## 2024-01-19 DIAGNOSIS — F319 Bipolar disorder, unspecified: Secondary | ICD-10-CM | POA: Diagnosis not present

## 2024-01-19 DIAGNOSIS — E1165 Type 2 diabetes mellitus with hyperglycemia: Secondary | ICD-10-CM | POA: Diagnosis not present

## 2024-01-19 DIAGNOSIS — E1169 Type 2 diabetes mellitus with other specified complication: Secondary | ICD-10-CM | POA: Diagnosis not present

## 2024-01-19 DIAGNOSIS — E113299 Type 2 diabetes mellitus with mild nonproliferative diabetic retinopathy without macular edema, unspecified eye: Secondary | ICD-10-CM | POA: Diagnosis not present

## 2024-01-19 DIAGNOSIS — E1122 Type 2 diabetes mellitus with diabetic chronic kidney disease: Secondary | ICD-10-CM | POA: Diagnosis not present

## 2024-01-20 DIAGNOSIS — E669 Obesity, unspecified: Secondary | ICD-10-CM | POA: Diagnosis not present

## 2024-01-20 DIAGNOSIS — F319 Bipolar disorder, unspecified: Secondary | ICD-10-CM | POA: Diagnosis not present

## 2024-01-20 DIAGNOSIS — E1169 Type 2 diabetes mellitus with other specified complication: Secondary | ICD-10-CM | POA: Diagnosis not present

## 2024-01-20 DIAGNOSIS — E1165 Type 2 diabetes mellitus with hyperglycemia: Secondary | ICD-10-CM | POA: Diagnosis not present

## 2024-01-21 DIAGNOSIS — E1165 Type 2 diabetes mellitus with hyperglycemia: Secondary | ICD-10-CM | POA: Diagnosis not present

## 2024-01-21 DIAGNOSIS — E1122 Type 2 diabetes mellitus with diabetic chronic kidney disease: Secondary | ICD-10-CM | POA: Diagnosis not present

## 2024-01-21 DIAGNOSIS — E113299 Type 2 diabetes mellitus with mild nonproliferative diabetic retinopathy without macular edema, unspecified eye: Secondary | ICD-10-CM | POA: Diagnosis not present

## 2024-01-21 DIAGNOSIS — F3112 Bipolar disorder, current episode manic without psychotic features, moderate: Secondary | ICD-10-CM | POA: Diagnosis not present

## 2024-01-21 DIAGNOSIS — N183 Chronic kidney disease, stage 3 unspecified: Secondary | ICD-10-CM | POA: Diagnosis not present

## 2024-01-21 DIAGNOSIS — F319 Bipolar disorder, unspecified: Secondary | ICD-10-CM | POA: Diagnosis not present

## 2024-01-22 DIAGNOSIS — E1165 Type 2 diabetes mellitus with hyperglycemia: Secondary | ICD-10-CM | POA: Diagnosis not present

## 2024-01-22 DIAGNOSIS — F319 Bipolar disorder, unspecified: Secondary | ICD-10-CM | POA: Diagnosis not present

## 2024-01-22 DIAGNOSIS — N183 Chronic kidney disease, stage 3 unspecified: Secondary | ICD-10-CM | POA: Diagnosis not present

## 2024-01-22 DIAGNOSIS — E1122 Type 2 diabetes mellitus with diabetic chronic kidney disease: Secondary | ICD-10-CM | POA: Diagnosis not present

## 2024-01-22 DIAGNOSIS — E1169 Type 2 diabetes mellitus with other specified complication: Secondary | ICD-10-CM | POA: Diagnosis not present

## 2024-01-26 DIAGNOSIS — E1122 Type 2 diabetes mellitus with diabetic chronic kidney disease: Secondary | ICD-10-CM | POA: Diagnosis not present

## 2024-01-26 DIAGNOSIS — R9431 Abnormal electrocardiogram [ECG] [EKG]: Secondary | ICD-10-CM | POA: Diagnosis not present

## 2024-01-26 DIAGNOSIS — E1169 Type 2 diabetes mellitus with other specified complication: Secondary | ICD-10-CM | POA: Diagnosis not present

## 2024-01-26 DIAGNOSIS — E1165 Type 2 diabetes mellitus with hyperglycemia: Secondary | ICD-10-CM | POA: Diagnosis not present

## 2024-01-26 DIAGNOSIS — R7989 Other specified abnormal findings of blood chemistry: Secondary | ICD-10-CM | POA: Diagnosis not present

## 2024-01-26 DIAGNOSIS — F319 Bipolar disorder, unspecified: Secondary | ICD-10-CM | POA: Diagnosis not present

## 2024-01-26 DIAGNOSIS — F3113 Bipolar disorder, current episode manic without psychotic features, severe: Secondary | ICD-10-CM | POA: Diagnosis not present

## 2024-01-26 DIAGNOSIS — F3112 Bipolar disorder, current episode manic without psychotic features, moderate: Secondary | ICD-10-CM | POA: Diagnosis not present

## 2024-01-27 DIAGNOSIS — E1122 Type 2 diabetes mellitus with diabetic chronic kidney disease: Secondary | ICD-10-CM | POA: Diagnosis not present

## 2024-01-27 DIAGNOSIS — N183 Chronic kidney disease, stage 3 unspecified: Secondary | ICD-10-CM | POA: Diagnosis not present

## 2024-01-27 DIAGNOSIS — E1169 Type 2 diabetes mellitus with other specified complication: Secondary | ICD-10-CM | POA: Diagnosis not present

## 2024-01-27 DIAGNOSIS — E1165 Type 2 diabetes mellitus with hyperglycemia: Secondary | ICD-10-CM | POA: Diagnosis not present

## 2024-02-06 DIAGNOSIS — F319 Bipolar disorder, unspecified: Secondary | ICD-10-CM | POA: Diagnosis not present

## 2024-02-08 DIAGNOSIS — I491 Atrial premature depolarization: Secondary | ICD-10-CM | POA: Diagnosis not present

## 2024-02-08 DIAGNOSIS — R4701 Aphasia: Secondary | ICD-10-CM | POA: Diagnosis not present

## 2024-02-08 DIAGNOSIS — N183 Chronic kidney disease, stage 3 unspecified: Secondary | ICD-10-CM | POA: Diagnosis not present

## 2024-02-08 DIAGNOSIS — N390 Urinary tract infection, site not specified: Secondary | ICD-10-CM | POA: Diagnosis not present

## 2024-02-08 DIAGNOSIS — R7989 Other specified abnormal findings of blood chemistry: Secondary | ICD-10-CM | POA: Diagnosis not present

## 2024-02-08 DIAGNOSIS — F3170 Bipolar disorder, currently in remission, most recent episode unspecified: Secondary | ICD-10-CM | POA: Diagnosis not present

## 2024-02-08 DIAGNOSIS — Z79899 Other long term (current) drug therapy: Secondary | ICD-10-CM | POA: Diagnosis not present

## 2024-02-08 DIAGNOSIS — R0602 Shortness of breath: Secondary | ICD-10-CM | POA: Diagnosis not present

## 2024-02-08 DIAGNOSIS — Z794 Long term (current) use of insulin: Secondary | ICD-10-CM | POA: Diagnosis not present

## 2024-02-08 DIAGNOSIS — F3181 Bipolar II disorder: Secondary | ICD-10-CM | POA: Diagnosis not present

## 2024-02-08 DIAGNOSIS — I129 Hypertensive chronic kidney disease with stage 1 through stage 4 chronic kidney disease, or unspecified chronic kidney disease: Secondary | ICD-10-CM | POA: Diagnosis not present

## 2024-02-08 DIAGNOSIS — I4510 Unspecified right bundle-branch block: Secondary | ICD-10-CM | POA: Diagnosis not present

## 2024-02-08 DIAGNOSIS — N179 Acute kidney failure, unspecified: Secondary | ICD-10-CM | POA: Diagnosis not present

## 2024-02-08 DIAGNOSIS — E785 Hyperlipidemia, unspecified: Secondary | ICD-10-CM | POA: Diagnosis not present

## 2024-02-08 DIAGNOSIS — G928 Other toxic encephalopathy: Secondary | ICD-10-CM | POA: Diagnosis not present

## 2024-02-08 DIAGNOSIS — R2990 Unspecified symptoms and signs involving the nervous system: Secondary | ICD-10-CM | POA: Diagnosis not present

## 2024-02-08 DIAGNOSIS — E1122 Type 2 diabetes mellitus with diabetic chronic kidney disease: Secondary | ICD-10-CM | POA: Diagnosis not present

## 2024-02-09 DIAGNOSIS — R2990 Unspecified symptoms and signs involving the nervous system: Secondary | ICD-10-CM | POA: Diagnosis not present

## 2024-02-09 DIAGNOSIS — R29702 NIHSS score 2: Secondary | ICD-10-CM | POA: Diagnosis not present

## 2024-02-09 DIAGNOSIS — R4701 Aphasia: Secondary | ICD-10-CM | POA: Diagnosis not present

## 2024-02-10 DIAGNOSIS — I517 Cardiomegaly: Secondary | ICD-10-CM | POA: Diagnosis not present

## 2024-02-10 DIAGNOSIS — R2990 Unspecified symptoms and signs involving the nervous system: Secondary | ICD-10-CM | POA: Diagnosis not present

## 2024-02-11 DIAGNOSIS — I4510 Unspecified right bundle-branch block: Secondary | ICD-10-CM | POA: Diagnosis not present

## 2024-02-11 DIAGNOSIS — F3112 Bipolar disorder, current episode manic without psychotic features, moderate: Secondary | ICD-10-CM | POA: Diagnosis not present

## 2024-02-11 DIAGNOSIS — F319 Bipolar disorder, unspecified: Secondary | ICD-10-CM | POA: Diagnosis not present

## 2024-02-11 DIAGNOSIS — E119 Type 2 diabetes mellitus without complications: Secondary | ICD-10-CM | POA: Diagnosis not present

## 2024-02-11 DIAGNOSIS — I491 Atrial premature depolarization: Secondary | ICD-10-CM | POA: Diagnosis not present

## 2024-02-11 DIAGNOSIS — R000 Tachycardia, unspecified: Secondary | ICD-10-CM | POA: Diagnosis not present

## 2024-02-13 DIAGNOSIS — E7800 Pure hypercholesterolemia, unspecified: Secondary | ICD-10-CM | POA: Diagnosis not present

## 2024-02-13 DIAGNOSIS — F319 Bipolar disorder, unspecified: Secondary | ICD-10-CM | POA: Diagnosis not present

## 2024-02-13 DIAGNOSIS — Z79899 Other long term (current) drug therapy: Secondary | ICD-10-CM | POA: Diagnosis not present

## 2024-02-13 DIAGNOSIS — E1165 Type 2 diabetes mellitus with hyperglycemia: Secondary | ICD-10-CM | POA: Diagnosis not present

## 2024-02-13 DIAGNOSIS — F413 Other mixed anxiety disorders: Secondary | ICD-10-CM | POA: Diagnosis not present

## 2024-02-13 DIAGNOSIS — F3162 Bipolar disorder, current episode mixed, moderate: Secondary | ICD-10-CM | POA: Diagnosis not present

## 2024-02-13 DIAGNOSIS — I10 Essential (primary) hypertension: Secondary | ICD-10-CM | POA: Diagnosis not present

## 2024-02-13 DIAGNOSIS — Z23 Encounter for immunization: Secondary | ICD-10-CM | POA: Diagnosis not present

## 2024-02-16 DIAGNOSIS — F319 Bipolar disorder, unspecified: Secondary | ICD-10-CM | POA: Diagnosis not present

## 2024-02-16 DIAGNOSIS — F3112 Bipolar disorder, current episode manic without psychotic features, moderate: Secondary | ICD-10-CM | POA: Diagnosis not present

## 2024-02-16 DIAGNOSIS — G4730 Sleep apnea, unspecified: Secondary | ICD-10-CM | POA: Diagnosis not present

## 2024-02-16 DIAGNOSIS — E119 Type 2 diabetes mellitus without complications: Secondary | ICD-10-CM | POA: Diagnosis not present

## 2024-02-17 DIAGNOSIS — I491 Atrial premature depolarization: Secondary | ICD-10-CM | POA: Diagnosis not present

## 2024-02-17 DIAGNOSIS — F3113 Bipolar disorder, current episode manic without psychotic features, severe: Secondary | ICD-10-CM | POA: Diagnosis not present

## 2024-02-17 DIAGNOSIS — Z79899 Other long term (current) drug therapy: Secondary | ICD-10-CM | POA: Diagnosis not present

## 2024-02-18 DIAGNOSIS — F319 Bipolar disorder, unspecified: Secondary | ICD-10-CM | POA: Diagnosis not present

## 2024-02-18 DIAGNOSIS — F3112 Bipolar disorder, current episode manic without psychotic features, moderate: Secondary | ICD-10-CM | POA: Diagnosis not present

## 2024-02-20 DIAGNOSIS — E119 Type 2 diabetes mellitus without complications: Secondary | ICD-10-CM | POA: Diagnosis not present

## 2024-02-20 DIAGNOSIS — G4730 Sleep apnea, unspecified: Secondary | ICD-10-CM | POA: Diagnosis not present

## 2024-02-20 DIAGNOSIS — F319 Bipolar disorder, unspecified: Secondary | ICD-10-CM | POA: Diagnosis not present

## 2024-02-20 DIAGNOSIS — F3112 Bipolar disorder, current episode manic without psychotic features, moderate: Secondary | ICD-10-CM | POA: Diagnosis not present

## 2024-02-23 DIAGNOSIS — F3112 Bipolar disorder, current episode manic without psychotic features, moderate: Secondary | ICD-10-CM | POA: Diagnosis not present

## 2024-02-23 DIAGNOSIS — F319 Bipolar disorder, unspecified: Secondary | ICD-10-CM | POA: Diagnosis not present

## 2024-02-24 DIAGNOSIS — Z886 Allergy status to analgesic agent status: Secondary | ICD-10-CM | POA: Diagnosis not present

## 2024-02-24 DIAGNOSIS — N3000 Acute cystitis without hematuria: Secondary | ICD-10-CM | POA: Diagnosis not present

## 2024-02-24 DIAGNOSIS — E1165 Type 2 diabetes mellitus with hyperglycemia: Secondary | ICD-10-CM | POA: Diagnosis not present

## 2024-02-24 DIAGNOSIS — R4789 Other speech disturbances: Secondary | ICD-10-CM | POA: Diagnosis not present

## 2024-02-24 DIAGNOSIS — Z86718 Personal history of other venous thrombosis and embolism: Secondary | ICD-10-CM | POA: Diagnosis not present

## 2024-02-24 DIAGNOSIS — N281 Cyst of kidney, acquired: Secondary | ICD-10-CM | POA: Diagnosis not present

## 2024-02-24 DIAGNOSIS — Z7982 Long term (current) use of aspirin: Secondary | ICD-10-CM | POA: Diagnosis not present

## 2024-02-24 DIAGNOSIS — R000 Tachycardia, unspecified: Secondary | ICD-10-CM | POA: Diagnosis not present

## 2024-02-24 DIAGNOSIS — Z87440 Personal history of urinary (tract) infections: Secondary | ICD-10-CM | POA: Diagnosis not present

## 2024-02-24 DIAGNOSIS — R059 Cough, unspecified: Secondary | ICD-10-CM | POA: Diagnosis not present

## 2024-02-24 DIAGNOSIS — I129 Hypertensive chronic kidney disease with stage 1 through stage 4 chronic kidney disease, or unspecified chronic kidney disease: Secondary | ICD-10-CM | POA: Diagnosis not present

## 2024-02-24 DIAGNOSIS — R509 Fever, unspecified: Secondary | ICD-10-CM | POA: Diagnosis not present

## 2024-02-24 DIAGNOSIS — Z794 Long term (current) use of insulin: Secondary | ICD-10-CM | POA: Diagnosis not present

## 2024-02-24 DIAGNOSIS — F3170 Bipolar disorder, currently in remission, most recent episode unspecified: Secondary | ICD-10-CM | POA: Diagnosis not present

## 2024-02-24 DIAGNOSIS — E782 Mixed hyperlipidemia: Secondary | ICD-10-CM | POA: Diagnosis not present

## 2024-02-24 DIAGNOSIS — N179 Acute kidney failure, unspecified: Secondary | ICD-10-CM | POA: Diagnosis not present

## 2024-02-24 DIAGNOSIS — Z8673 Personal history of transient ischemic attack (TIA), and cerebral infarction without residual deficits: Secondary | ICD-10-CM | POA: Diagnosis not present

## 2024-02-24 DIAGNOSIS — D631 Anemia in chronic kidney disease: Secondary | ICD-10-CM | POA: Diagnosis not present

## 2024-02-24 DIAGNOSIS — N1831 Chronic kidney disease, stage 3a: Secondary | ICD-10-CM | POA: Diagnosis not present

## 2024-02-24 DIAGNOSIS — N390 Urinary tract infection, site not specified: Secondary | ICD-10-CM | POA: Diagnosis not present

## 2024-02-24 DIAGNOSIS — N12 Tubulo-interstitial nephritis, not specified as acute or chronic: Secondary | ICD-10-CM | POA: Diagnosis not present

## 2024-02-24 DIAGNOSIS — Z1612 Extended spectrum beta lactamase (ESBL) resistance: Secondary | ICD-10-CM | POA: Diagnosis not present

## 2024-02-24 DIAGNOSIS — I959 Hypotension, unspecified: Secondary | ICD-10-CM | POA: Diagnosis not present

## 2024-02-24 DIAGNOSIS — Z7984 Long term (current) use of oral hypoglycemic drugs: Secondary | ICD-10-CM | POA: Diagnosis not present

## 2024-02-24 DIAGNOSIS — R739 Hyperglycemia, unspecified: Secondary | ICD-10-CM | POA: Diagnosis not present

## 2024-02-24 DIAGNOSIS — Z8619 Personal history of other infectious and parasitic diseases: Secondary | ICD-10-CM | POA: Diagnosis not present

## 2024-02-24 DIAGNOSIS — R079 Chest pain, unspecified: Secondary | ICD-10-CM | POA: Diagnosis not present

## 2024-02-24 DIAGNOSIS — E1122 Type 2 diabetes mellitus with diabetic chronic kidney disease: Secondary | ICD-10-CM | POA: Diagnosis not present

## 2024-02-24 DIAGNOSIS — B9620 Unspecified Escherichia coli [E. coli] as the cause of diseases classified elsewhere: Secondary | ICD-10-CM | POA: Diagnosis not present

## 2024-02-24 DIAGNOSIS — E871 Hypo-osmolality and hyponatremia: Secondary | ICD-10-CM | POA: Diagnosis not present

## 2024-03-10 DIAGNOSIS — F319 Bipolar disorder, unspecified: Secondary | ICD-10-CM | POA: Diagnosis not present

## 2024-03-10 DIAGNOSIS — N179 Acute kidney failure, unspecified: Secondary | ICD-10-CM | POA: Diagnosis not present

## 2024-03-10 DIAGNOSIS — F3112 Bipolar disorder, current episode manic without psychotic features, moderate: Secondary | ICD-10-CM | POA: Diagnosis not present

## 2024-03-10 DIAGNOSIS — N1831 Chronic kidney disease, stage 3a: Secondary | ICD-10-CM | POA: Diagnosis not present

## 2024-03-10 DIAGNOSIS — E1122 Type 2 diabetes mellitus with diabetic chronic kidney disease: Secondary | ICD-10-CM | POA: Diagnosis not present

## 2024-03-10 DIAGNOSIS — I129 Hypertensive chronic kidney disease with stage 1 through stage 4 chronic kidney disease, or unspecified chronic kidney disease: Secondary | ICD-10-CM | POA: Diagnosis not present

## 2024-03-12 DIAGNOSIS — E1165 Type 2 diabetes mellitus with hyperglycemia: Secondary | ICD-10-CM | POA: Diagnosis not present

## 2024-03-12 DIAGNOSIS — E7800 Pure hypercholesterolemia, unspecified: Secondary | ICD-10-CM | POA: Diagnosis not present

## 2024-03-12 DIAGNOSIS — F413 Other mixed anxiety disorders: Secondary | ICD-10-CM | POA: Diagnosis not present

## 2024-03-12 DIAGNOSIS — F3162 Bipolar disorder, current episode mixed, moderate: Secondary | ICD-10-CM | POA: Diagnosis not present

## 2024-03-12 DIAGNOSIS — I10 Essential (primary) hypertension: Secondary | ICD-10-CM | POA: Diagnosis not present

## 2024-03-12 DIAGNOSIS — Z0000 Encounter for general adult medical examination without abnormal findings: Secondary | ICD-10-CM | POA: Diagnosis not present

## 2024-03-12 DIAGNOSIS — F319 Bipolar disorder, unspecified: Secondary | ICD-10-CM | POA: Diagnosis not present

## 2024-03-12 DIAGNOSIS — Z79899 Other long term (current) drug therapy: Secondary | ICD-10-CM | POA: Diagnosis not present

## 2024-03-12 DIAGNOSIS — Z23 Encounter for immunization: Secondary | ICD-10-CM | POA: Diagnosis not present

## 2024-03-12 DIAGNOSIS — Z86711 Personal history of pulmonary embolism: Secondary | ICD-10-CM | POA: Diagnosis not present

## 2024-03-23 DIAGNOSIS — F3162 Bipolar disorder, current episode mixed, moderate: Secondary | ICD-10-CM | POA: Diagnosis not present

## 2024-03-23 DIAGNOSIS — F413 Other mixed anxiety disorders: Secondary | ICD-10-CM | POA: Diagnosis not present

## 2024-03-29 DIAGNOSIS — Z23 Encounter for immunization: Secondary | ICD-10-CM | POA: Diagnosis not present

## 2024-04-01 DIAGNOSIS — E1122 Type 2 diabetes mellitus with diabetic chronic kidney disease: Secondary | ICD-10-CM | POA: Diagnosis not present

## 2024-04-01 DIAGNOSIS — N2581 Secondary hyperparathyroidism of renal origin: Secondary | ICD-10-CM | POA: Diagnosis not present

## 2024-04-01 DIAGNOSIS — I129 Hypertensive chronic kidney disease with stage 1 through stage 4 chronic kidney disease, or unspecified chronic kidney disease: Secondary | ICD-10-CM | POA: Diagnosis not present

## 2024-04-01 DIAGNOSIS — R801 Persistent proteinuria, unspecified: Secondary | ICD-10-CM | POA: Diagnosis not present

## 2024-04-01 DIAGNOSIS — N183 Chronic kidney disease, stage 3 unspecified: Secondary | ICD-10-CM | POA: Diagnosis not present

## 2024-04-05 DIAGNOSIS — Z86718 Personal history of other venous thrombosis and embolism: Secondary | ICD-10-CM | POA: Diagnosis not present

## 2024-04-05 DIAGNOSIS — R600 Localized edema: Secondary | ICD-10-CM | POA: Diagnosis not present

## 2024-04-05 DIAGNOSIS — D72819 Decreased white blood cell count, unspecified: Secondary | ICD-10-CM | POA: Diagnosis not present

## 2024-04-05 DIAGNOSIS — R509 Fever, unspecified: Secondary | ICD-10-CM | POA: Diagnosis not present

## 2024-04-05 DIAGNOSIS — T07XXXA Unspecified multiple injuries, initial encounter: Secondary | ICD-10-CM | POA: Diagnosis not present

## 2024-04-05 DIAGNOSIS — S81801A Unspecified open wound, right lower leg, initial encounter: Secondary | ICD-10-CM | POA: Diagnosis not present

## 2024-04-05 DIAGNOSIS — Z7984 Long term (current) use of oral hypoglycemic drugs: Secondary | ICD-10-CM | POA: Diagnosis not present

## 2024-04-05 DIAGNOSIS — N179 Acute kidney failure, unspecified: Secondary | ICD-10-CM | POA: Diagnosis not present

## 2024-04-05 DIAGNOSIS — F419 Anxiety disorder, unspecified: Secondary | ICD-10-CM | POA: Diagnosis not present

## 2024-04-05 DIAGNOSIS — Z860100 Personal history of colon polyps, unspecified: Secondary | ICD-10-CM | POA: Diagnosis not present

## 2024-04-05 DIAGNOSIS — I872 Venous insufficiency (chronic) (peripheral): Secondary | ICD-10-CM | POA: Diagnosis not present

## 2024-04-05 DIAGNOSIS — N1831 Chronic kidney disease, stage 3a: Secondary | ICD-10-CM | POA: Diagnosis not present

## 2024-04-05 DIAGNOSIS — E1122 Type 2 diabetes mellitus with diabetic chronic kidney disease: Secondary | ICD-10-CM | POA: Diagnosis not present

## 2024-04-05 DIAGNOSIS — F3181 Bipolar II disorder: Secondary | ICD-10-CM | POA: Diagnosis not present

## 2024-04-05 DIAGNOSIS — E119 Type 2 diabetes mellitus without complications: Secondary | ICD-10-CM | POA: Diagnosis not present

## 2024-04-05 DIAGNOSIS — L089 Local infection of the skin and subcutaneous tissue, unspecified: Secondary | ICD-10-CM | POA: Diagnosis not present

## 2024-04-05 DIAGNOSIS — L03115 Cellulitis of right lower limb: Secondary | ICD-10-CM | POA: Diagnosis not present

## 2024-04-05 DIAGNOSIS — Z7982 Long term (current) use of aspirin: Secondary | ICD-10-CM | POA: Diagnosis not present

## 2024-04-05 DIAGNOSIS — I129 Hypertensive chronic kidney disease with stage 1 through stage 4 chronic kidney disease, or unspecified chronic kidney disease: Secondary | ICD-10-CM | POA: Diagnosis not present

## 2024-04-05 DIAGNOSIS — Z794 Long term (current) use of insulin: Secondary | ICD-10-CM | POA: Diagnosis not present

## 2024-04-05 DIAGNOSIS — Z833 Family history of diabetes mellitus: Secondary | ICD-10-CM | POA: Diagnosis not present

## 2024-04-05 DIAGNOSIS — R6883 Chills (without fever): Secondary | ICD-10-CM | POA: Diagnosis not present

## 2024-04-05 DIAGNOSIS — M79661 Pain in right lower leg: Secondary | ICD-10-CM | POA: Diagnosis not present

## 2024-04-05 DIAGNOSIS — G4730 Sleep apnea, unspecified: Secondary | ICD-10-CM | POA: Diagnosis not present

## 2024-04-05 DIAGNOSIS — F3170 Bipolar disorder, currently in remission, most recent episode unspecified: Secondary | ICD-10-CM | POA: Diagnosis not present

## 2024-04-09 DIAGNOSIS — F413 Other mixed anxiety disorders: Secondary | ICD-10-CM | POA: Diagnosis not present

## 2024-04-09 DIAGNOSIS — F3162 Bipolar disorder, current episode mixed, moderate: Secondary | ICD-10-CM | POA: Diagnosis not present

## 2024-04-20 DIAGNOSIS — D649 Anemia, unspecified: Secondary | ICD-10-CM | POA: Diagnosis not present

## 2024-04-30 DIAGNOSIS — N390 Urinary tract infection, site not specified: Secondary | ICD-10-CM | POA: Diagnosis not present

## 2024-04-30 DIAGNOSIS — N189 Chronic kidney disease, unspecified: Secondary | ICD-10-CM | POA: Diagnosis not present

## 2024-04-30 DIAGNOSIS — E1122 Type 2 diabetes mellitus with diabetic chronic kidney disease: Secondary | ICD-10-CM | POA: Diagnosis not present

## 2024-04-30 DIAGNOSIS — Z20822 Contact with and (suspected) exposure to covid-19: Secondary | ICD-10-CM | POA: Diagnosis not present

## 2024-04-30 DIAGNOSIS — F319 Bipolar disorder, unspecified: Secondary | ICD-10-CM | POA: Diagnosis not present

## 2024-04-30 DIAGNOSIS — R509 Fever, unspecified: Secondary | ICD-10-CM | POA: Diagnosis not present

## 2024-04-30 DIAGNOSIS — I129 Hypertensive chronic kidney disease with stage 1 through stage 4 chronic kidney disease, or unspecified chronic kidney disease: Secondary | ICD-10-CM | POA: Diagnosis not present

## 2024-04-30 DIAGNOSIS — Z794 Long term (current) use of insulin: Secondary | ICD-10-CM | POA: Diagnosis not present

## 2024-04-30 DIAGNOSIS — N3001 Acute cystitis with hematuria: Secondary | ICD-10-CM | POA: Diagnosis not present

## 2024-05-07 DIAGNOSIS — F319 Bipolar disorder, unspecified: Secondary | ICD-10-CM | POA: Diagnosis not present

## 2024-05-07 DIAGNOSIS — E669 Obesity, unspecified: Secondary | ICD-10-CM | POA: Diagnosis not present

## 2024-05-07 DIAGNOSIS — F413 Other mixed anxiety disorders: Secondary | ICD-10-CM | POA: Diagnosis not present

## 2024-05-07 DIAGNOSIS — N1832 Chronic kidney disease, stage 3b: Secondary | ICD-10-CM | POA: Diagnosis not present

## 2024-05-07 DIAGNOSIS — N390 Urinary tract infection, site not specified: Secondary | ICD-10-CM | POA: Diagnosis not present

## 2024-05-07 DIAGNOSIS — F3162 Bipolar disorder, current episode mixed, moderate: Secondary | ICD-10-CM | POA: Diagnosis not present
